# Patient Record
Sex: Male | Born: 1945 | Race: White | Hispanic: No | Marital: Married | Smoking: Former smoker
Health system: Southern US, Community
[De-identification: ages and names within clinical notes are randomized; demographics above are authoritative.]

## PROBLEM LIST (undated history)

## (undated) DIAGNOSIS — T402X5A Adverse effect of other opioids, initial encounter: Secondary | ICD-10-CM

## (undated) DIAGNOSIS — IMO0002 Reserved for concepts with insufficient information to code with codable children: Secondary | ICD-10-CM

## (undated) DIAGNOSIS — J4489 Other specified chronic obstructive pulmonary disease: Secondary | ICD-10-CM

## (undated) DIAGNOSIS — S2249XA Multiple fractures of ribs, unspecified side, initial encounter for closed fracture: Secondary | ICD-10-CM

## (undated) DIAGNOSIS — F419 Anxiety disorder, unspecified: Secondary | ICD-10-CM

## (undated) DIAGNOSIS — I10 Essential (primary) hypertension: Secondary | ICD-10-CM

## (undated) DIAGNOSIS — J151 Pneumonia due to Pseudomonas: Secondary | ICD-10-CM

## (undated) DIAGNOSIS — M545 Low back pain, unspecified: Secondary | ICD-10-CM

## (undated) DIAGNOSIS — Z9981 Dependence on supplemental oxygen: Secondary | ICD-10-CM

## (undated) DIAGNOSIS — R042 Hemoptysis: Secondary | ICD-10-CM

## (undated) DIAGNOSIS — C801 Malignant (primary) neoplasm, unspecified: Secondary | ICD-10-CM

## (undated) DIAGNOSIS — IMO0001 Reserved for inherently not codable concepts without codable children: Secondary | ICD-10-CM

## (undated) DIAGNOSIS — K08109 Complete loss of teeth, unspecified cause, unspecified class: Secondary | ICD-10-CM

## (undated) DIAGNOSIS — R569 Unspecified convulsions: Secondary | ICD-10-CM

## (undated) DIAGNOSIS — K5903 Drug induced constipation: Secondary | ICD-10-CM

## (undated) DIAGNOSIS — Z720 Tobacco use: Secondary | ICD-10-CM

## (undated) DIAGNOSIS — G8929 Other chronic pain: Secondary | ICD-10-CM

## (undated) DIAGNOSIS — K219 Gastro-esophageal reflux disease without esophagitis: Secondary | ICD-10-CM

## (undated) DIAGNOSIS — J9383 Other pneumothorax: Secondary | ICD-10-CM

## (undated) DIAGNOSIS — S43006A Unspecified dislocation of unspecified shoulder joint, initial encounter: Secondary | ICD-10-CM

## (undated) DIAGNOSIS — Z972 Presence of dental prosthetic device (complete) (partial): Secondary | ICD-10-CM

## (undated) DIAGNOSIS — J449 Chronic obstructive pulmonary disease, unspecified: Secondary | ICD-10-CM

## (undated) DIAGNOSIS — S0990XA Unspecified injury of head, initial encounter: Secondary | ICD-10-CM

## (undated) HISTORY — PX: VASECTOMY: SHX75

## (undated) HISTORY — DX: Reserved for inherently not codable concepts without codable children: IMO0001

## (undated) HISTORY — DX: Reserved for concepts with insufficient information to code with codable children: IMO0002

## (undated) HISTORY — PX: HERNIA REPAIR: SHX51

## (undated) HISTORY — PX: DIAGNOSTIC LAPAROSCOPY: SUR761

## (undated) SURGERY — VIDEO BRONCHOSCOPY WITHOUT FLUORO
Anesthesia: Moderate Sedation

---

## 1996-06-06 DIAGNOSIS — S2249XA Multiple fractures of ribs, unspecified side, initial encounter for closed fracture: Secondary | ICD-10-CM

## 1996-06-06 DIAGNOSIS — S43006A Unspecified dislocation of unspecified shoulder joint, initial encounter: Secondary | ICD-10-CM

## 1996-06-06 DIAGNOSIS — S0990XA Unspecified injury of head, initial encounter: Secondary | ICD-10-CM

## 1996-06-06 HISTORY — DX: Multiple fractures of ribs, unspecified side, initial encounter for closed fracture: S22.49XA

## 1996-06-06 HISTORY — DX: Unspecified injury of head, initial encounter: S09.90XA

## 1996-06-06 HISTORY — PX: CHEST TUBE INSERTION: SHX231

## 1996-06-06 HISTORY — DX: Unspecified dislocation of unspecified shoulder joint, initial encounter: S43.006A

## 2005-06-10 ENCOUNTER — Encounter: Admission: RE | Admit: 2005-06-10 | Discharge: 2005-06-10 | Payer: Self-pay | Admitting: Family Medicine

## 2012-11-01 ENCOUNTER — Other Ambulatory Visit: Payer: Self-pay | Admitting: Family Medicine

## 2012-11-01 ENCOUNTER — Ambulatory Visit
Admission: RE | Admit: 2012-11-01 | Discharge: 2012-11-01 | Disposition: A | Discharge status: Home / Self Care | Payer: Managed Care, Other (non HMO) | Source: Ambulatory Visit | Attending: Family Medicine | Admitting: Family Medicine

## 2012-11-01 DIAGNOSIS — J189 Pneumonia, unspecified organism: Secondary | ICD-10-CM

## 2012-11-30 ENCOUNTER — Ambulatory Visit (INDEPENDENT_AMBULATORY_CARE_PROVIDER_SITE_OTHER): Payer: Managed Care, Other (non HMO) | Admitting: Internal Medicine

## 2012-11-30 DIAGNOSIS — R0602 Shortness of breath: Secondary | ICD-10-CM

## 2012-11-30 LAB — PULMONARY FUNCTION TEST

## 2012-11-30 NOTE — Progress Notes (Signed)
PFT done today. 

## 2012-12-12 ENCOUNTER — Encounter: Payer: Self-pay | Admitting: Family Medicine

## 2014-06-03 ENCOUNTER — Ambulatory Visit
Admission: RE | Admit: 2014-06-03 | Discharge: 2014-06-03 | Disposition: A | Discharge status: Home / Self Care | Payer: Managed Care, Other (non HMO) | Source: Ambulatory Visit | Attending: Family Medicine | Admitting: Family Medicine

## 2014-06-03 ENCOUNTER — Other Ambulatory Visit: Payer: Self-pay | Admitting: Family Medicine

## 2014-06-03 DIAGNOSIS — J189 Pneumonia, unspecified organism: Secondary | ICD-10-CM

## 2014-08-03 ENCOUNTER — Encounter (HOSPITAL_COMMUNITY): Payer: Self-pay | Admitting: Emergency Medicine

## 2014-08-03 ENCOUNTER — Inpatient Hospital Stay (HOSPITAL_COMMUNITY)
Admission: EM | Admit: 2014-08-03 | Discharge: 2014-08-09 | DRG: 166 | Disposition: A | Discharge status: Home / Self Care | Payer: Medicare Other | Attending: Internal Medicine | Admitting: Internal Medicine

## 2014-08-03 ENCOUNTER — Emergency Department (HOSPITAL_COMMUNITY): Payer: Medicare Other

## 2014-08-03 ENCOUNTER — Inpatient Hospital Stay (HOSPITAL_COMMUNITY): Payer: Medicare Other

## 2014-08-03 DIAGNOSIS — C3412 Malignant neoplasm of upper lobe, left bronchus or lung: Secondary | ICD-10-CM | POA: Diagnosis not present

## 2014-08-03 DIAGNOSIS — C3490 Malignant neoplasm of unspecified part of unspecified bronchus or lung: Secondary | ICD-10-CM | POA: Diagnosis present

## 2014-08-03 DIAGNOSIS — Z720 Tobacco use: Secondary | ICD-10-CM | POA: Diagnosis not present

## 2014-08-03 DIAGNOSIS — K089 Disorder of teeth and supporting structures, unspecified: Secondary | ICD-10-CM

## 2014-08-03 DIAGNOSIS — J449 Chronic obstructive pulmonary disease, unspecified: Secondary | ICD-10-CM | POA: Diagnosis not present

## 2014-08-03 DIAGNOSIS — R911 Solitary pulmonary nodule: Secondary | ICD-10-CM | POA: Diagnosis present

## 2014-08-03 DIAGNOSIS — J42 Unspecified chronic bronchitis: Secondary | ICD-10-CM

## 2014-08-03 DIAGNOSIS — I6782 Cerebral ischemia: Secondary | ICD-10-CM | POA: Diagnosis not present

## 2014-08-03 DIAGNOSIS — K045 Chronic apical periodontitis: Secondary | ICD-10-CM | POA: Diagnosis present

## 2014-08-03 DIAGNOSIS — J9383 Other pneumothorax: Principal | ICD-10-CM | POA: Diagnosis present

## 2014-08-03 DIAGNOSIS — R918 Other nonspecific abnormal finding of lung field: Secondary | ICD-10-CM | POA: Diagnosis present

## 2014-08-03 DIAGNOSIS — R030 Elevated blood-pressure reading, without diagnosis of hypertension: Secondary | ICD-10-CM

## 2014-08-03 DIAGNOSIS — F17210 Nicotine dependence, cigarettes, uncomplicated: Secondary | ICD-10-CM | POA: Diagnosis present

## 2014-08-03 DIAGNOSIS — J939 Pneumothorax, unspecified: Secondary | ICD-10-CM | POA: Diagnosis not present

## 2014-08-03 DIAGNOSIS — C3492 Malignant neoplasm of unspecified part of left bronchus or lung: Secondary | ICD-10-CM | POA: Diagnosis not present

## 2014-08-03 DIAGNOSIS — I10 Essential (primary) hypertension: Secondary | ICD-10-CM | POA: Diagnosis not present

## 2014-08-03 DIAGNOSIS — Z9689 Presence of other specified functional implants: Secondary | ICD-10-CM

## 2014-08-03 DIAGNOSIS — R0602 Shortness of breath: Secondary | ICD-10-CM | POA: Diagnosis not present

## 2014-08-03 DIAGNOSIS — J90 Pleural effusion, not elsewhere classified: Secondary | ICD-10-CM | POA: Diagnosis not present

## 2014-08-03 DIAGNOSIS — K029 Dental caries, unspecified: Secondary | ICD-10-CM | POA: Diagnosis present

## 2014-08-03 DIAGNOSIS — T380X5A Adverse effect of glucocorticoids and synthetic analogues, initial encounter: Secondary | ICD-10-CM | POA: Diagnosis present

## 2014-08-03 DIAGNOSIS — R042 Hemoptysis: Secondary | ICD-10-CM | POA: Diagnosis present

## 2014-08-03 DIAGNOSIS — J9811 Atelectasis: Secondary | ICD-10-CM | POA: Diagnosis not present

## 2014-08-03 DIAGNOSIS — J151 Pneumonia due to Pseudomonas: Secondary | ICD-10-CM | POA: Diagnosis present

## 2014-08-03 DIAGNOSIS — R0781 Pleurodynia: Secondary | ICD-10-CM | POA: Diagnosis not present

## 2014-08-03 DIAGNOSIS — C801 Malignant (primary) neoplasm, unspecified: Secondary | ICD-10-CM

## 2014-08-03 DIAGNOSIS — F419 Anxiety disorder, unspecified: Secondary | ICD-10-CM | POA: Diagnosis present

## 2014-08-03 DIAGNOSIS — Z789 Other specified health status: Secondary | ICD-10-CM | POA: Diagnosis not present

## 2014-08-03 DIAGNOSIS — J9311 Primary spontaneous pneumothorax: Secondary | ICD-10-CM | POA: Diagnosis not present

## 2014-08-03 DIAGNOSIS — IMO0001 Reserved for inherently not codable concepts without codable children: Secondary | ICD-10-CM | POA: Diagnosis present

## 2014-08-03 DIAGNOSIS — R739 Hyperglycemia, unspecified: Secondary | ICD-10-CM | POA: Diagnosis present

## 2014-08-03 DIAGNOSIS — F17200 Nicotine dependence, unspecified, uncomplicated: Secondary | ICD-10-CM | POA: Diagnosis not present

## 2014-08-03 DIAGNOSIS — F411 Generalized anxiety disorder: Secondary | ICD-10-CM | POA: Diagnosis present

## 2014-08-03 DIAGNOSIS — K5730 Diverticulosis of large intestine without perforation or abscess without bleeding: Secondary | ICD-10-CM | POA: Diagnosis not present

## 2014-08-03 DIAGNOSIS — Z4682 Encounter for fitting and adjustment of non-vascular catheter: Secondary | ICD-10-CM | POA: Diagnosis not present

## 2014-08-03 DIAGNOSIS — K006 Disturbances in tooth eruption: Secondary | ICD-10-CM | POA: Diagnosis not present

## 2014-08-03 DIAGNOSIS — E440 Moderate protein-calorie malnutrition: Secondary | ICD-10-CM | POA: Diagnosis present

## 2014-08-03 DIAGNOSIS — N281 Cyst of kidney, acquired: Secondary | ICD-10-CM | POA: Diagnosis not present

## 2014-08-03 DIAGNOSIS — J441 Chronic obstructive pulmonary disease with (acute) exacerbation: Secondary | ICD-10-CM | POA: Diagnosis present

## 2014-08-03 DIAGNOSIS — J984 Other disorders of lung: Secondary | ICD-10-CM | POA: Diagnosis not present

## 2014-08-03 DIAGNOSIS — J189 Pneumonia, unspecified organism: Secondary | ICD-10-CM | POA: Diagnosis present

## 2014-08-03 DIAGNOSIS — Z452 Encounter for adjustment and management of vascular access device: Secondary | ICD-10-CM | POA: Diagnosis not present

## 2014-08-03 DIAGNOSIS — J439 Emphysema, unspecified: Secondary | ICD-10-CM | POA: Diagnosis not present

## 2014-08-03 HISTORY — DX: Hemoptysis: R04.2

## 2014-08-03 HISTORY — DX: Other pneumothorax: J93.83

## 2014-08-03 HISTORY — DX: Unspecified injury of head, initial encounter: S09.90XA

## 2014-08-03 HISTORY — DX: Pneumonia due to Pseudomonas: J15.1

## 2014-08-03 HISTORY — DX: Tobacco use: Z72.0

## 2014-08-03 HISTORY — DX: Chronic obstructive pulmonary disease, unspecified: J44.9

## 2014-08-03 HISTORY — DX: Other specified chronic obstructive pulmonary disease: J44.89

## 2014-08-03 HISTORY — DX: Unspecified dislocation of unspecified shoulder joint, initial encounter: S43.006A

## 2014-08-03 HISTORY — DX: Multiple fractures of ribs, unspecified side, initial encounter for closed fracture: S22.49XA

## 2014-08-03 HISTORY — DX: Malignant (primary) neoplasm, unspecified: C80.1

## 2014-08-03 HISTORY — DX: Essential (primary) hypertension: I10

## 2014-08-03 LAB — CBC WITH DIFFERENTIAL/PLATELET
Basophils Absolute: 0 10*3/uL (ref 0.0–0.1)
Basophils Relative: 0 % (ref 0–1)
EOS PCT: 4 % (ref 0–5)
Eosinophils Absolute: 0.3 10*3/uL (ref 0.0–0.7)
HEMATOCRIT: 43.7 % (ref 39.0–52.0)
HEMOGLOBIN: 14.7 g/dL (ref 13.0–17.0)
LYMPHS ABS: 1.8 10*3/uL (ref 0.7–4.0)
Lymphocytes Relative: 24 % (ref 12–46)
MCH: 29.6 pg (ref 26.0–34.0)
MCHC: 33.6 g/dL (ref 30.0–36.0)
MCV: 88.1 fL (ref 78.0–100.0)
MONO ABS: 0.7 10*3/uL (ref 0.1–1.0)
MONOS PCT: 9 % (ref 3–12)
NEUTROS ABS: 4.7 10*3/uL (ref 1.7–7.7)
Neutrophils Relative %: 63 % (ref 43–77)
PLATELETS: 195 10*3/uL (ref 150–400)
RBC: 4.96 MIL/uL (ref 4.22–5.81)
RDW: 13.1 % (ref 11.5–15.5)
WBC: 7.6 10*3/uL (ref 4.0–10.5)

## 2014-08-03 LAB — BASIC METABOLIC PANEL
ANION GAP: 4 — AB (ref 5–15)
BUN: 14 mg/dL (ref 6–23)
CHLORIDE: 104 mmol/L (ref 96–112)
CO2: 32 mmol/L (ref 19–32)
Calcium: 9 mg/dL (ref 8.4–10.5)
Creatinine, Ser: 0.94 mg/dL (ref 0.50–1.35)
GFR calc Af Amer: >90 mL/min (ref >90)
GFR, EST NON AFRICAN AMERICAN: 84 mL/min — AB (ref >90)
Glucose, Bld: 89 mg/dL (ref 70–99)
POTASSIUM: 4.7 mmol/L (ref 3.5–5.1)
Sodium: 140 mmol/L (ref 135–145)

## 2014-08-03 LAB — I-STAT TROPONIN, ED: Troponin i, poc: 0 ng/mL (ref 0.00–0.08)

## 2014-08-03 LAB — MRSA PCR SCREENING: MRSA BY PCR: NEGATIVE

## 2014-08-03 LAB — BRAIN NATRIURETIC PEPTIDE: B Natriuretic Peptide: 30.5 pg/mL (ref 0.0–100.0)

## 2014-08-03 MED ORDER — MORPHINE SULFATE 2 MG/ML IJ SOLN
2.0000 mg | Freq: Once | INTRAMUSCULAR | Status: AC
  Administered 2014-08-03: 2 mg via INTRAVENOUS
  Filled 2014-08-03: qty 1

## 2014-08-03 MED ORDER — NICOTINE 14 MG/24HR TD PT24
14.0000 mg | MEDICATED_PATCH | Freq: Every day | TRANSDERMAL | Status: DC
  Administered 2014-08-03 – 2014-08-09 (×7): 14 mg via TRANSDERMAL
  Filled 2014-08-03 (×8): qty 1

## 2014-08-03 MED ORDER — LIDOCAINE HCL (PF) 1 % IJ SOLN
INTRAMUSCULAR | Status: AC
  Administered 2014-08-03: 5 mL
  Filled 2014-08-03: qty 5

## 2014-08-03 MED ORDER — SODIUM CHLORIDE 0.9 % IV SOLN
INTRAVENOUS | Status: DC
  Administered 2014-08-03: 17:00:00 via INTRAVENOUS

## 2014-08-03 MED ORDER — GUAIFENESIN-DM 100-10 MG/5ML PO SYRP
10.0000 mL | ORAL_SOLUTION | ORAL | Status: DC | PRN
Start: 2014-08-03 — End: 2014-08-07
  Filled 2014-08-03: qty 10

## 2014-08-03 MED ORDER — GUAIFENESIN ER 600 MG PO TB12
1200.0000 mg | ORAL_TABLET | Freq: Two times a day (BID) | ORAL | Status: DC
  Administered 2014-08-03 – 2014-08-09 (×12): 1200 mg via ORAL
  Filled 2014-08-03 (×14): qty 2

## 2014-08-03 MED ORDER — ENOXAPARIN SODIUM 40 MG/0.4ML ~~LOC~~ SOLN
40.0000 mg | SUBCUTANEOUS | Status: DC
  Administered 2014-08-03 – 2014-08-08 (×6): 40 mg via SUBCUTANEOUS
  Filled 2014-08-03 (×8): qty 0.4

## 2014-08-03 MED ORDER — IPRATROPIUM-ALBUTEROL 0.5-2.5 (3) MG/3ML IN SOLN
3.0000 mL | Freq: Once | RESPIRATORY_TRACT | Status: DC
  Filled 2014-08-03: qty 3

## 2014-08-03 MED ORDER — IBUPROFEN 200 MG PO TABS
600.0000 mg | ORAL_TABLET | Freq: Four times a day (QID) | ORAL | Status: DC | PRN
  Administered 2014-08-04: 600 mg via ORAL
  Filled 2014-08-03: qty 3

## 2014-08-03 MED ORDER — HYDROCODONE-ACETAMINOPHEN 5-325 MG PO TABS
1.0000 | ORAL_TABLET | ORAL | Status: DC | PRN
  Administered 2014-08-03: 1 via ORAL
  Administered 2014-08-04 – 2014-08-08 (×9): 2 via ORAL
  Filled 2014-08-03 (×2): qty 2
  Filled 2014-08-03: qty 1
  Filled 2014-08-03 (×7): qty 2

## 2014-08-03 MED ORDER — ONDANSETRON HCL 4 MG/2ML IJ SOLN: 4.0000 mg | Freq: Four times a day (QID) | INTRAMUSCULAR | Status: DC | PRN

## 2014-08-03 MED ORDER — BIO-FLAX 1000 MG PO CAPS: 1000.0000 mg | ORAL_CAPSULE | Freq: Two times a day (BID) | ORAL | Status: DC

## 2014-08-03 MED ORDER — MORPHINE SULFATE 4 MG/ML IJ SOLN
4.0000 mg | INTRAMUSCULAR | Status: DC | PRN
  Administered 2014-08-03 – 2014-08-08 (×3): 4 mg via INTRAVENOUS
  Filled 2014-08-03 (×3): qty 1

## 2014-08-03 MED ORDER — ALUM & MAG HYDROXIDE-SIMETH 200-200-20 MG/5ML PO SUSP: 30.0000 mL | Freq: Four times a day (QID) | ORAL | Status: DC | PRN

## 2014-08-03 MED ORDER — MIDAZOLAM HCL 2 MG/2ML IJ SOLN
4.0000 mg | Freq: Once | INTRAMUSCULAR | Status: AC
  Administered 2014-08-03: 2 mg via INTRAVENOUS
  Filled 2014-08-03: qty 4

## 2014-08-03 MED ORDER — CLONAZEPAM 1 MG PO TABS
1.0000 mg | ORAL_TABLET | Freq: Two times a day (BID) | ORAL | Status: DC
  Administered 2014-08-03 – 2014-08-09 (×12): 1 mg via ORAL
  Filled 2014-08-03 (×9): qty 2
  Filled 2014-08-03: qty 1
  Filled 2014-08-03: qty 2
  Filled 2014-08-03: qty 1
  Filled 2014-08-03: qty 2

## 2014-08-03 MED ORDER — DOXYCYCLINE HYCLATE 100 MG PO TABS
100.0000 mg | ORAL_TABLET | Freq: Two times a day (BID) | ORAL | Status: DC
  Administered 2014-08-03 – 2014-08-06 (×6): 100 mg via ORAL
  Filled 2014-08-03 (×8): qty 1

## 2014-08-03 MED ORDER — MORPHINE SULFATE 4 MG/ML IJ SOLN
4.0000 mg | Freq: Once | INTRAMUSCULAR | Status: AC
  Administered 2014-08-03: 4 mg via INTRAVENOUS
  Filled 2014-08-03: qty 1

## 2014-08-03 MED ORDER — ONDANSETRON HCL 4 MG PO TABS: 4.0000 mg | ORAL_TABLET | Freq: Four times a day (QID) | ORAL | Status: DC | PRN

## 2014-08-03 MED ORDER — IPRATROPIUM-ALBUTEROL 0.5-2.5 (3) MG/3ML IN SOLN
3.0000 mL | Freq: Four times a day (QID) | RESPIRATORY_TRACT | Status: DC
  Administered 2014-08-03 – 2014-08-09 (×22): 3 mL via RESPIRATORY_TRACT
  Filled 2014-08-03 (×24): qty 3

## 2014-08-03 MED ORDER — SODIUM CHLORIDE 0.9 % IJ SOLN
3.0000 mL | Freq: Two times a day (BID) | INTRAMUSCULAR | Status: DC
  Administered 2014-08-04 – 2014-08-09 (×11): 3 mL via INTRAVENOUS

## 2014-08-03 MED ORDER — METHYLPREDNISOLONE SODIUM SUCC 125 MG IJ SOLR
60.0000 mg | Freq: Four times a day (QID) | INTRAMUSCULAR | Status: DC
  Administered 2014-08-03 – 2014-08-04 (×4): 60 mg via INTRAVENOUS
  Filled 2014-08-03 (×3): qty 0.96
  Filled 2014-08-03 (×3): qty 2
  Filled 2014-08-03: qty 0.96

## 2014-08-03 MED ORDER — HYDRALAZINE HCL 20 MG/ML IJ SOLN
10.0000 mg | Freq: Four times a day (QID) | INTRAMUSCULAR | Status: DC | PRN
  Administered 2014-08-03: 10 mg via INTRAVENOUS
  Filled 2014-08-03: qty 1

## 2014-08-03 MED ORDER — ALBUTEROL SULFATE (2.5 MG/3ML) 0.083% IN NEBU
2.5000 mg | INHALATION_SOLUTION | RESPIRATORY_TRACT | Status: DC | PRN
  Administered 2014-08-07 – 2014-08-08 (×2): 2.5 mg via RESPIRATORY_TRACT
  Filled 2014-08-03 (×2): qty 3

## 2014-08-03 MED ORDER — FAMOTIDINE 20 MG PO TABS
20.0000 mg | ORAL_TABLET | Freq: Two times a day (BID) | ORAL | Status: DC
  Administered 2014-08-03 – 2014-08-09 (×12): 20 mg via ORAL
  Filled 2014-08-03 (×14): qty 1

## 2014-08-03 MED ORDER — LORATADINE 10 MG PO TABS
10.0000 mg | ORAL_TABLET | Freq: Two times a day (BID) | ORAL | Status: DC
  Administered 2014-08-03 – 2014-08-09 (×12): 10 mg via ORAL
  Filled 2014-08-03 (×14): qty 1

## 2014-08-03 NOTE — ED Notes (Signed)
Dr. Owen at bedside

## 2014-08-03 NOTE — Progress Notes (Signed)
PHARMACIST - PHYSICIAN ORDER COMMUNICATION  CONCERNING: P&T Medication Policy on Herbal Medications  DESCRIPTION:  This patient's order for: BioFlax caps  has been noted.  This product(s) is classified as an "herbal" or natural product. Due to a lack of definitive safety studies or FDA approval, nonstandard manufacturing practices, plus the potential risk of unknown drug-drug interactions while on inpatient medications, the Pharmacy and Therapeutics Committee does not permit the use of "herbal" or natural products of this type within [facility].   ACTION TAKEN: The pharmacy department is unable to verify this order at this time and your patient has been informed of this safety policy. Please reevaluate patient's clinical condition at discharge and address if the herbal or natural product(s) should be resumed at that time.  Sherlon Handing, PharmD, BCPS Clinical pharmacist, pager [phone] 08/03/2014 6:52 PM

## 2014-08-03 NOTE — ED Notes (Signed)
Spoke with patient wife at bedside. States patient takes duo neb 4 times a day and only one dose. Doctor notified.

## 2014-08-03 NOTE — Op Note (Signed)
CARDIOTHORACIC SURGERY OPERATIVE NOTE  Date of Procedure:  08/03/2014  Preoperative Diagnosis: Left Pneumothorax  Postoperative Diagnosis: Same  Procedure:   left chest tube placement  Surgeon:   Valentina Gu. Roxy Manns, MD  Anesthesia: 1% lidocaine local with intravenous sedation    DETAILS OF THE OPERATIVE PROCEDURE  Following full informed consent the patient was given midazolam 2 mg and morphine 2 mg intravenously and continuously monitored for rhythm, BP and oxygen saturation. The left chest was prepared and draped in a sterile manner. 1% lidocaine was utilized to anesthetize the skin and subcutaneous tissues. A small incision was made and a 28 French straight chest tube was placed through the incision into the pleural space. The tube was secured to the skin and connected to a closed suction collection device. The patient tolerated the procedure well. A portable CXR was ordered. There were no complications.    Valentina Gu. Roxy Manns, MD 08/03/2014 4:12 PM
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
## 2014-08-03 NOTE — ED Notes (Signed)
Called for STAT portable xray.
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
## 2014-08-03 NOTE — ED Notes (Signed)
EKG completed and given to EDP.  

## 2014-08-03 NOTE — Progress Notes (Signed)
Pt arrived from Ed per stretcher alert/oriented CHG bath done and MRSA swab done oriented to room given nurse call light released new orders
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
## 2014-08-03 NOTE — ED Notes (Signed)
EPD at bedside 

## 2014-08-03 NOTE — ED Notes (Signed)
Onset 3-4 days ago cough and shortness of breath with history of COPD.  Patient stated coughed real hard one day ago and shortness of breath worsening overtime. Seen at [facility] office today sent to ED for evaluation via EMS.

## 2014-08-04 ENCOUNTER — Inpatient Hospital Stay (HOSPITAL_COMMUNITY): Payer: Medicare Other

## 2014-08-04 LAB — CBC
HEMATOCRIT: 44.9 % (ref 39.0–52.0)
HEMOGLOBIN: 15.3 g/dL (ref 13.0–17.0)
MCH: 29.7 pg (ref 26.0–34.0)
MCHC: 34.1 g/dL (ref 30.0–36.0)
MCV: 87.2 fL (ref 78.0–100.0)
Platelets: 220 10*3/uL (ref 150–400)
RBC: 5.15 MIL/uL (ref 4.22–5.81)
RDW: 13 % (ref 11.5–15.5)
WBC: 7 10*3/uL (ref 4.0–10.5)

## 2014-08-04 LAB — BASIC METABOLIC PANEL
Anion gap: 7 (ref 5–15)
BUN: 17 mg/dL (ref 6–23)
CHLORIDE: 101 mmol/L (ref 96–112)
CO2: 29 mmol/L (ref 19–32)
Calcium: 8.9 mg/dL (ref 8.4–10.5)
Creatinine, Ser: 0.79 mg/dL (ref 0.50–1.35)
GFR calc Af Amer: >90 mL/min (ref >90)
GFR calc non Af Amer: >90 mL/min (ref >90)
Glucose, Bld: 192 mg/dL — ABNORMAL HIGH (ref 70–99)
POTASSIUM: 4 mmol/L (ref 3.5–5.1)
Sodium: 137 mmol/L (ref 135–145)

## 2014-08-04 MED ORDER — AMLODIPINE BESYLATE 5 MG PO TABS
5.0000 mg | ORAL_TABLET | Freq: Every day | ORAL | Status: DC
  Administered 2014-08-04 – 2014-08-06 (×3): 5 mg via ORAL
  Filled 2014-08-04 (×3): qty 1

## 2014-08-04 MED ORDER — METHYLPREDNISOLONE SODIUM SUCC 125 MG IJ SOLR
60.0000 mg | Freq: Two times a day (BID) | INTRAMUSCULAR | Status: DC
  Administered 2014-08-04 – 2014-08-06 (×4): 60 mg via INTRAVENOUS
  Filled 2014-08-04 (×4): qty 0.96

## 2014-08-04 MED ORDER — HYDRALAZINE HCL 20 MG/ML IJ SOLN
20.0000 mg | Freq: Four times a day (QID) | INTRAMUSCULAR | Status: DC | PRN
  Administered 2014-08-04: 20 mg via INTRAVENOUS
  Filled 2014-08-04: qty 1

## 2014-08-04 NOTE — Progress Notes (Addendum)
[facility] TEAM 1 - Stepdown/ICU TEAM Progress Note  Jeffrey Hines OFB:[id number] DOB: 1946-01-19 DOA: 08/03/2014 PCP: [name]  Admit HPI / Brief Narrative: 69 y.o. male with history of COPD not on home oxygen, ongoing tobacco abuse, anxiety, and HTN who presented to the [facility] ED on 08/03/14 with left-sided chest pain and dyspnea.  The patient had a violent coughing spell followed by the abrupt onset of severe left sided chest pain and dyspnea. He initially went to his PCP's walk-in clinic where O2 saturations were 90% on room air and chest x-ray showed large left pneumothorax. He was transported by ambulance to the ED where Cardiothoracic Surgery placed a left chest tube and Hospitalist admission was requested.   HPI/Subjective: Pt states he feels much better.  He notes only modest pain at site of L CT.  He denies n/v, abdom pain, or HA.    Assessment/Plan:  L Spontaneous Pneumothorax Care per TCTS  LUL CAP  - hemoptysis  Cont emperic abx - will need repeat imaging to assure resolves w/ abx tx  12 x 10 mm spiculated pleural-based density superior segment of right lower lobe  Noted via CT of chest - Radiology suggests f/u imaging in 3 months - pt tells me this has been present "for years" and that he [facility] and was told it was "a scar" - I will attempt to obtain records from the [place]   Acute COPD exacerbation long-standing history of heavy tobacco abuse - has been having problems with chronic relapsing bronchitis off and on for the past 6 months, w/ occasional blood tinged sputum - would benefit from outpt Pulm f/u - TCTS note indicates they have consulted PCCM   Tobacco abuse  Counseled again on the absolute need to never smoke again   Anxiety   Elevated BP Pt has no prior hx of HTN per his report - current elevated BP may simply be due to his acute illness - will tx for now, but watch for possible need to stop meds as he  improves  Steroid induced hyperglycemia Check A1c to assure he does not have underlying DM   Code Status: FULL Family Communication: no family present at time of exam Disposition Plan: SDU  Consultants: TCTS  Procedures: 2/28 - L chest tube placement   Antibiotics: Doxycycline 2/28 >  DVT prophylaxis: lovenox  Objective: Blood pressure 161/106, pulse 103, temperature 97.7 F (36.5 C), temperature source Oral, resp. rate 21, height 5\' 11"  (1.803 m), weight 81 kg (178 lb 9.2 oz), SpO2 94 %.  Intake/Output Summary (Last 24 hours) at 08/04/14 1401 Last data filed at 08/04/14 0930  Gross per 24 hour  Intake  877.5 ml  Output    700 ml  Net  177.5 ml   Exam: General: No acute respiratory distress laying in bed  Lungs: Clear to auscultation bilaterally without wheezes or crackles Cardiovascular: Regular rate and rhythm without murmur gallop or rub normal S1 and S2 Abdomen: Nontender, nondistended, soft, bowel sounds positive, no rebound, no ascites, no appreciable mass Extremities: No significant cyanosis, clubbing, or edema bilateral lower extremities  Data Reviewed: Basic Metabolic Panel:  Recent Labs Lab 08/03/14 1359 08/04/14 0545  NA 140 137  K 4.7 4.0  CL 104 101  CO2 32 29  GLUCOSE 89 192*  BUN 14 17  CREATININE 0.94 0.79  CALCIUM 9.0 8.9    Liver Function Tests: No results for input(s): AST, ALT, ALKPHOS,  BILITOT, PROT, ALBUMIN in the last 168 hours. No results for input(s): LIPASE, AMYLASE in the last 168 hours. No results for input(s): AMMONIA in the last 168 hours.  CBC:  Recent Labs Lab 08/03/14 1359 08/04/14 0545  WBC 7.6 7.0  NEUTROABS 4.7  --   HGB 14.7 15.3  HCT 43.7 44.9  MCV 88.1 87.2  PLT 195 220   CBG: No results for input(s): GLUCAP in the last 168 hours.  Recent Results (from the past 240 hour(s))  MRSA PCR Screening     Status: None   Collection Time: 08/03/14 12:32 PM  Result Value Ref Range Status   MRSA by PCR  NEGATIVE NEGATIVE Final    Comment:        The GeneXpert MRSA Assay (FDA approved for NASAL specimens only), is one component of a comprehensive MRSA colonization surveillance program. It is not intended to diagnose MRSA infection nor to guide or monitor treatment for MRSA infections.      Studies:  Recent x-ray studies have been reviewed in detail by the Attending Physician  Scheduled Meds:  Scheduled Meds: . clonazePAM  1 mg Oral BID  . doxycycline  100 mg Oral BID PC  . enoxaparin (LOVENOX) injection  40 mg Subcutaneous Q24H  . famotidine  20 mg Oral BID  . guaiFENesin  1,200 mg Oral BID  . ipratropium-albuterol  3 mL Nebulization Once  . ipratropium-albuterol  3 mL Nebulization QID  . ipratropium-albuterol  3 mL Nebulization Once  . loratadine  10 mg Oral Q12H  . methylPREDNISolone (SOLU-MEDROL) injection  60 mg Intravenous Q6H  . nicotine  14 mg Transdermal Daily  . sodium chloride  3 mL Intravenous Q12H    Time spent on care of this patient: 35 mins   MCCLUNG,JEFFREY T , MD   [facility] Office  [phone] Pager - Text Page per Shea Evans as per below:  On-Call/Text Page:      [email or web address]      password [email or web address]  If 7PM-7AM, please contact night-coverage [email or web address] Password [email or web address] 08/04/2014, 2:01 PM   LOS: 1 day

## 2014-08-04 NOTE — Consult Note (Signed)
Name: Jeffrey Hines MRN: [id number] DOB: Mar 19, 1946    ADMISSION DATE:  08/03/2014 CONSULTATION DATE:  08/04/2014  REFERRING MD :  Thereasa Solo  CHIEF COMPLAINT:  COPD exacerbation complicated by large left PTX s/p chest tube 2/28  BRIEF PATIENT DESCRIPTION: 69 y.o. M brought to [facility] ED 2/28 for chest pain and worsening dyspnea.  Found to have AECOPD complicated by large left sided PTX.  CVTS consulted and chest tube placed in ED.  PCCM consulted for recs regarding COPD and ? Bronch for further evaluation of hemoptysis.  SIGNIFICANT EVENTS  2/28 - admit, chest tube placed by Dr. Ricard Dillon with CVTS 2/29 - PCCM consult  STUDIES:  CXR 2/28 >>> left PTX CT chest 2/28 >>> large left PTX, 90%.  34mm x 99mm spiculated pleural based density in superior segment of RLL.    HISTORY OF PRESENT ILLNESS:  Jeffrey Hines is a 69 y.o. M with PMH as outlined below.  He presented to [facility] ED 2/28 with chest pain and worsening dyspnea.  At 11pm on night prior to presentation, he had a coughing spell followed by abrupt onset severe left sided chest pain and dyspnea.  His wife suggested that he seek eval in ED but he declied.  Symptoms persisted overnight and into the morning and pt was unable to get comfortable.  He went to PCP walk in clinic following morning and CXR showed large left sided PTX.  He was transported to [facility] ED where CVTS was consulted and placed a left sided chest tube. For 1 week prior to admit, pt reports that he had been having progressively worsening cough productive of green colored sputum, dyspnea, and wheezing.  CT of chest revealed a spiculated pleural based density in superior segment of RLL.  He is a long time smoker, since age 32.  He used to smoke at least 1ppd for unknown # of years, now down to about 1/2 ppd.  In addition, pt had 1 - 2 episodes of ? Hemoptysis.  Due to his underlying COPD, PTX, ? Hemoptysis and CT findings, pulm consult was requested.  PFT's from 12/12/12:  FEV1 68% pre / 66%  post, ratio 74% pre / 70% post.  PAST MEDICAL HISTORY :   has a past medical history of COPD (chronic obstructive pulmonary disease); Closed head injury (1998); Multiple rib fractures (1998); Shoulder dislocation (1998); COPD with chronic bronchitis; Tobacco abuse; Hypertension; Spontaneous pneumothorax (08/03/2014); and Hemoptysis (08/03/2014).  has past surgical history that includes Hernia repair and Chest tube insertion (Left, 1998). Prior to Admission medications   Medication Sig Start Date End Date Taking? Authorizing Provider  clonazePAM (KLONOPIN) 1 MG tablet Take 1 mg by mouth 2 (two) times daily. 07/30/14  Yes Historical Provider, MD  dextromethorphan-guaiFENesin (MUCINEX DM) 30-600 MG per 12 hr tablet Take 1 tablet by mouth every 12 (twelve) hours.   Yes Historical Provider, MD  Flaxseed, Linseed, (BIO-FLAX) 1000 MG CAPS Take 1,000 mg by mouth 2 (two) times daily.   Yes Historical Provider, MD  ibuprofen (ADVIL,MOTRIN) 200 MG tablet Take 400 mg by mouth every 6 (six) hours as needed for mild pain or moderate pain.   Yes Historical Provider, MD  ipratropium-albuterol (DUONEB) 0.5-2.5 (3) MG/3ML SOLN Take 3 mLs by nebulization 4 (four) times daily as needed (Wheezing, shortness of breath).  07/08/14  Yes Historical Provider, MD  loratadine (CLARITIN) 10 MG tablet Take 10 mg by mouth every 12 (twelve) hours.   Yes Historical Provider, MD  PROVENTIL HFA 108 (90 BASE) MCG/ACT  inhaler Inhale 2 puffs into the lungs every 6 (six) hours as needed. 07/05/14  Yes Historical Provider, MD  Saw Palmetto, Serenoa repens, (SAW PALMETTO PO) Take 2 tablets by mouth 3 (three) times daily.   Yes Historical Provider, MD  ST JOHNS WORT PO Take 1 tablet by mouth 3 (three) times daily.   Yes Historical Provider, MD  SYMBICORT 160-4.5 MCG/ACT inhaler Inhale 2 puffs into the lungs 2 (two) times daily. 07/05/14  Yes Historical Provider, MD   Allergies  Allergen Reactions  . Advair Diskus [Fluticasone-Salmeterol]  Anxiety    FAMILY HISTORY:  family history is not on file. SOCIAL HISTORY:  reports that he has been smoking Cigarettes.  He has been smoking about 0.50 packs per day. He does not have any smokeless tobacco history on file. He reports that he does not drink alcohol or use illicit drugs.  REVIEW OF SYSTEMS:   All negative; except for those that are bolded, which indicate positives.  Constitutional: weight loss, weight gain, night sweats, fevers, chills, fatigue, weakness.  HEENT: headaches, sore throat, sneezing, nasal congestion, post nasal drip, difficulty swallowing, tooth/dental problems, visual complaints, visual changes, ear aches. Neuro: difficulty with speech, weakness, numbness, ataxia. CV:  chest pain, orthopnea, PND, swelling in lower extremities, dizziness, palpitations, syncope.  Resp: cough, hemoptysis, dyspnea, wheezing. GI  heartburn, indigestion, abdominal pain, nausea, vomiting, diarrhea, constipation, change in bowel habits, loss of appetite, hematemesis, melena, hematochezia.  GU: dysuria, change in color of urine, urgency or frequency, flank pain, hematuria. MSK: joint pain or swelling, decreased range of motion. Psych: change in mood or affect, depression, anxiety, suicidal ideations, homicidal ideations. Skin: rash, itching, bruising.    SUBJECTIVE:  Denies hemoptysis, states it only happened maybe 2 times last week.  Now coughing up brown colored sputum.  Denies chest pain.  Breathing is much more comfortable.  VITAL SIGNS: Temp:  [97.6 F (36.4 C)-98.9 F (37.2 C)] 97.7 F (36.5 C) (02/29 1203) Pulse Rate:  [88-120] 103 (02/29 1203) Resp:  [17-32] 21 (02/29 1203) BP: (130-179)/(77-138) 161/106 mmHg (02/29 1203) SpO2:  [89 %-97 %] 94 % (02/29 1207) FiO2 (%):  [4 %-24 %] 4 % (02/29 1207) Weight:  [81 kg (178 lb 9.2 oz)] 81 kg (178 lb 9.2 oz) (02/28 1855)  PHYSICAL EXAMINATION: General: WDWN male, resting in bed, in NAD. Neuro: A&O x 3, non-focal.  HEENT:  [place]/AT. PERRL, sclerae anicteric. Cardiovascular: RRR, no M/R/G.  Lungs: Respirations even and unlabored.  Expiratory wheeze.  Left chest tube in place on suction, no air leak. Abdomen: BS x 4, soft, NT/ND.  Musculoskeletal: No gross deformities, no edema.  Skin: Intact, warm, no rashes.     Recent Labs Lab 08/03/14 1359 08/04/14 0545  NA 140 137  K 4.7 4.0  CL 104 101  CO2 32 29  BUN 14 17  CREATININE 0.94 0.79  GLUCOSE 89 192*    Recent Labs Lab 08/03/14 1359 08/04/14 0545  HGB 14.7 15.3  HCT 43.7 44.9  WBC 7.6 7.0  PLT 195 220   Ct Chest Wo Contrast  08/03/2014   CLINICAL DATA:  Pneumothorax.  EXAM: CT CHEST WITHOUT CONTRAST  TECHNIQUE: Multidetector CT imaging of the chest was performed following the standard protocol without IV contrast.  COMPARISON:  Radiographs of same day.  FINDINGS: There is noted a large left-sided pneumothorax of greater than 90%. Significant atelectasis of the left upper and lower lobes is noted. No significant pleural effusion is noted. 12 x 10 mm spiculated  density is noted along the posterior pleura of the superior segment of the right lower lobe. It is uncertain if this represents scarring or neoplasm. Visualized portion of upper abdomen appears normal. Multiple old left posterior rib fractures are noted. No acute fracture is noted. No mediastinal shift is seen at this time.  IMPRESSION: Large left-sided pneumothorax is noted of greater than 90%. No significant mediastinal shift is noted at this time.  12 x 10 mm spiculated pleural-based density is noted medially in superior segment of right lower lobe which may represent scarring or neoplasm. PET-CT or followup CT scan in 3 months is recommended for further evaluation.  Critical Value/emergent results were called by telephone at the time of interpretation on 08/03/2014 at 2:29 pm to Dr. Charlesetta Shanks , who verbally acknowledged these results.   Electronically Signed   By: Marijo Conception, M.D.   On:  08/03/2014 14:29   Dg Chest Port 1 View  08/04/2014   CLINICAL DATA:  Reassess pneumothorax  EXAM: PORTABLE CHEST - 1 VIEW  COMPARISON:  Portable chest x-ray of August 03, 2014  FINDINGS: There is persistent volume loss on the left with shift of the mediastinum from right to left. No pneumothorax is visible. Parenchymal consolidation in the left upper lobe is less conspicuous today. The left-sided chest tube tip remains in the pulmonary apex. The right lung is hyperinflated and clear. The heart is normal in size. The pulmonary vascularity is not engorged.  IMPRESSION: No residual left-sided pneumothorax is demonstrated. There is no significant pleural effusion. Patchy upper lobe infiltrate has improved as well. There is compensatory hyperinflation of the right lung.   Electronically Signed   By: David  Martinique   On: 08/04/2014 08:38   Dg Chest Portable 1 View  08/03/2014   CLINICAL DATA:  Chest tube placement on left.  Pneumothorax.  EXAM: PORTABLE CHEST - 1 VIEW  COMPARISON:  Chest CT 08/03/2014  FINDINGS: Interval placement of left chest tube. Left upper airspace opacity noted, presumably consolidation. Right lung is clear. No visible effusions. No visible residual pneumothorax.  IMPRESSION: Re-expansion of the left lung following chest tube placement. Left upper lobe airspace consolidation.   Electronically Signed   By: Rolm Baptise M.D.   On: 08/03/2014 17:00    ASSESSMENT / PLAN:  Left sided PTX - s/p tube thoracostomy 2/28 Roxy Manns). Recs: Chest tube management per CVTS.  ? CAP AECOPD Tobacco use disorder Recs: Continue solumedrol, abx (doxy), BD's, robitussin, nicotine patch. Tobacco cessation.  Set up with pulmonary as an outpatient for repeat PFT's and further management. Tobacco cessation.  89mm x 50mm spiculated pleural based density - ? Etiology.  Has never had any malignancy workup.  Per hospitalist note, pt reports that this finding has been present for "years" and has been  biopsied in the past at the [place]. Hemoptysis - resolved per pt, only occurred 2 times. LUL collapse / volume loss on initial CXR - improved on CXR 2/29, ? Whether simply due to large PTX vs underlying endobronchial lesion Recs: Repeat CXR in AM.  If no continued improvement / resolution then will CT chest. Repeat CT in 3 months and f/u as outpatient. Pending repeat CT, may need PET scan.   Montey Hora, [facility] Pager: [phone]  or [phone] 08/04/2014, 2:06 PM    PCCM ATTENDING: I have reviewed pt's initial presentation, consultants notes and hospital database in detail.  The  above assessment and plan was formulated under my direction.   Merton Border, MD;  PCCM service; [place] [phone]

## 2014-08-04 NOTE — Progress Notes (Addendum)
TCTS BRIEF PROGRESS NOTE   Yesterday I placed an order for portable CXR to be done this morning to f/u pneumothorax and incomplete reexpansion of left upper lobe.  That order got changed to PA + Lateral for unclear reasons - presumably by the Hospitalist team.   I think that order change was inappropriate given that the patient's chest tube should remain on suction for the time being.  As of right now, no follow up CXR of any kind has been done.  Will continue to follow and assist with this patient's care for now, but would specifically request that the Hospitalist team consult with [name] and the Pulmonary Medicine team prior to changing our orders.  I have asked the Pulmonary Medicine team to see this patient in consultation.  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  He would be at significant risk for having underlying lung cancer, and he may need bronchoscopy.  At the very least he needs a pulmonologist to be involved in his long term care.  Jeffrey Hines H 08/04/2014 8:35 AM

## 2014-08-04 NOTE — Progress Notes (Signed)
[address]       [place] [address]             [phone]     CARDIOTHORACIC SURGERY PROGRESS NOTE  Subjective: Feels better.  Breathing much improved.  Cough improved.  Minimal pain.  Objective: Vital signs in last 24 hours: Temp:  [97.6 F (36.4 C)-98.9 F (37.2 C)] 97.7 F (36.5 C) (02/29 1203) Pulse Rate:  [88-120] 103 (02/29 1203) Cardiac Rhythm:  [-] Sinus tachycardia (02/29 0900) Resp:  [17-32] 21 (02/29 1203) BP: (130-179)/(77-138) 161/106 mmHg (02/29 1203) SpO2:  [89 %-97 %] 94 % (02/29 1207) FiO2 (%):  [4 %-24 %] 4 % (02/29 1207) Weight:  [81 kg (178 lb 9.2 oz)] 81 kg (178 lb 9.2 oz) (02/28 1855)  Physical Exam:  Rhythm:   sinus  Breath sounds: Scattered rhonchi w/out wheezes  Heart sounds:  RRR  Incisions:  n/a  Abdomen:  soft  Extremities:  Warm  Chest tubes:  Low volume thin serosanguinous output, no air leak    Intake/Output from previous day: 02/28 0701 - 02/29 0700 In: 637.5 [I.V.:637.5] Out: 500 [Urine:400; Chest Tube:100] Intake/Output this shift: Total I/O In: 240 [P.O.:240] Out: 200 [Urine:200]  Lab Results:  Recent Labs  08/03/14 1359 08/04/14 0545  WBC 7.6 7.0  HGB 14.7 15.3  HCT 43.7 44.9  PLT 195 220   BMET:  Recent Labs  08/03/14 1359 08/04/14 0545  NA 140 137  K 4.7 4.0  CL 104 101  CO2 32 29  GLUCOSE 89 192*  BUN 14 17  CREATININE 0.94 0.79  CALCIUM 9.0 8.9    CBG (last 3)  No results for input(s): GLUCAP in the last 72 hours. PT/INR:  No results for input(s): LABPROT, INR in the last 72 hours.  CXR:  PORTABLE CHEST - 1 VIEW  COMPARISON: Portable chest x-ray of August 03, 2014  FINDINGS: There is persistent volume loss on the left with shift of the mediastinum from right to left. No pneumothorax is visible. Parenchymal consolidation in the left upper lobe is less conspicuous today. The left-sided chest tube tip remains in the pulmonary apex. The right lung is hyperinflated and  clear. The heart is normal in size. The pulmonary vascularity is not engorged.  IMPRESSION: No residual left-sided pneumothorax is demonstrated. There is no significant pleural effusion. Patchy upper lobe infiltrate has improved as well. There is compensatory hyperinflation of the right lung.   Electronically Signed  By: David Martinique  On: 08/04/2014 08:38   Assessment/Plan:  Stable s/p tube thoracostomy for left spontaneous PTX.  Chest tube in good position and no residual PTX.  No air leak noted at this time.  Opacity of LUL improved but not completely resolved - possible underlying pneumonia.    Continue tube to suction for now.  Await input from Pulmonary Medicine team.  At some point I think it would be wise to get repeat CT chest w/ IV contrast to r/o possible lung mass.  Given the history of hemoptysis one could make an argument that he should have flexible bronchoscopy even if no mass is seen on CT scan.  However, this might not need to occur in the immediate future, and it might make sense to let him get over his acute problem first.   I spent in excess of 15 minutes during the conduct of this hospital encounter and >50% of this time involved direct face-to-face encounter with the patient for counseling and/or  coordination of their care.   Lewellyn Fultz H 08/04/2014 1:52 PM

## 2014-08-04 NOTE — Progress Notes (Signed)
Inpatient Diabetes Program Recommendations  AACE/ADA: New Consensus Statement on Inpatient Glycemic Control (2013)  Target Ranges:  Prepandial:   less than 140 mg/dL      Peak postprandial:   less than 180 mg/dL (1-2 hours)      Critically ill patients:  140 - 180 mg/dL   Results for NEVAEH, CASILLAS (MRN [id number]) as of 08/04/2014 08:51  Ref. Range 08/03/2014 13:59 08/04/2014 05:45  Glucose Latest Range: 70-99 mg/dL 89 192 (H)   Diabetes history: No Outpatient Diabetes medications: NA Current orders for Inpatient glycemic control: None  Inpatient Diabetes Program Recommendations Correction (SSI): While ordered steroids, may want to consider ordering CBGs with Novolog correction scale. HgbA1C: May want to consider ordering an A1C to evaluate glycemic control over the past 2-3 months.  Note: Patient does not have a documented history of diabetes. Noted fasting lab glucose of 192 mg/dl this morning. Noted hyperglycemic likely due to steroids. While inpatient and ordered steroids, may want to consider ordering CBGs with Novolog correction scale and an A1C.  Thanks, Barnie Alderman, RN, MSN, CCRN, CDE Diabetes Coordinator Inpatient Diabetes Program [phone] (Team Pager) [phone] (AP office) [phone] [facility] office)

## 2014-08-04 NOTE — Progress Notes (Signed)
Notified Kathline Magic, triad NP that pt has orders for a chest PA and lateral xray. MD advised hold until morning.

## 2014-08-05 ENCOUNTER — Inpatient Hospital Stay (HOSPITAL_COMMUNITY): Payer: Medicare Other

## 2014-08-05 ENCOUNTER — Telehealth: Payer: Self-pay | Admitting: Pulmonary Disease

## 2014-08-05 DIAGNOSIS — J9811 Atelectasis: Secondary | ICD-10-CM

## 2014-08-05 DIAGNOSIS — Z9689 Presence of other specified functional implants: Secondary | ICD-10-CM | POA: Diagnosis not present

## 2014-08-05 DIAGNOSIS — R042 Hemoptysis: Secondary | ICD-10-CM

## 2014-08-05 DIAGNOSIS — R739 Hyperglycemia, unspecified: Secondary | ICD-10-CM | POA: Diagnosis present

## 2014-08-05 DIAGNOSIS — J189 Pneumonia, unspecified organism: Secondary | ICD-10-CM | POA: Diagnosis present

## 2014-08-05 DIAGNOSIS — J939 Pneumothorax, unspecified: Secondary | ICD-10-CM

## 2014-08-05 DIAGNOSIS — Z789 Other specified health status: Secondary | ICD-10-CM

## 2014-08-05 LAB — COMPREHENSIVE METABOLIC PANEL
ALK PHOS: 68 U/L (ref 39–117)
ALT: 9 U/L (ref 0–53)
AST: 15 U/L (ref 0–37)
Albumin: 3.5 g/dL (ref 3.5–5.2)
Anion gap: 10 (ref 5–15)
BUN: 18 mg/dL (ref 6–23)
CALCIUM: 8.8 mg/dL (ref 8.4–10.5)
CO2: 26 mmol/L (ref 19–32)
Chloride: 104 mmol/L (ref 96–112)
Creatinine, Ser: 0.74 mg/dL (ref 0.50–1.35)
Glucose, Bld: 143 mg/dL — ABNORMAL HIGH (ref 70–99)
Potassium: 4.8 mmol/L (ref 3.5–5.1)
SODIUM: 140 mmol/L (ref 135–145)
Total Bilirubin: 0.7 mg/dL (ref 0.3–1.2)
Total Protein: 6.4 g/dL (ref 6.0–8.3)

## 2014-08-05 LAB — EXPECTORATED SPUTUM ASSESSMENT W GRAM STAIN, RFLX TO RESP C

## 2014-08-05 LAB — EXPECTORATED SPUTUM ASSESSMENT W REFEX TO RESP CULTURE

## 2014-08-05 MED ORDER — LIDOCAINE HCL 2 % EX GEL
1.0000 "application " | Freq: Once | CUTANEOUS | Status: DC
  Filled 2014-08-05: qty 5

## 2014-08-05 MED ORDER — PHENYLEPHRINE HCL 0.25 % NA SOLN
1.0000 | Freq: Four times a day (QID) | NASAL | Status: DC | PRN
  Filled 2014-08-05: qty 15

## 2014-08-05 MED ORDER — BUTAMBEN-TETRACAINE-BENZOCAINE 2-2-14 % EX AERO
1.0000 | INHALATION_SPRAY | Freq: Once | CUTANEOUS | Status: DC
  Filled 2014-08-05: qty 20

## 2014-08-05 MED ORDER — DEXTROSE 5 % IV SOLN
500.0000 mg | INTRAVENOUS | Status: DC
  Administered 2014-08-05: 500 mg via INTRAVENOUS
  Filled 2014-08-05 (×2): qty 500

## 2014-08-05 MED ORDER — CEFTRIAXONE SODIUM IN DEXTROSE 20 MG/ML IV SOLN
1.0000 g | INTRAVENOUS | Status: DC
  Administered 2014-08-05: 1 g via INTRAVENOUS
  Filled 2014-08-05 (×2): qty 50

## 2014-08-05 NOTE — Progress Notes (Addendum)
      [address]       [place] [address]             [phone]            Subjective: Patient hopes chest tube is removed soon.  Objective: Vital signs in last 24 hours: Temp:  [97.2 F (36.2 C)-97.7 F (36.5 C)] 97.7 F (36.5 C) (03/01 0733) Pulse Rate:  [57-128] 93 (03/01 0733) Cardiac Rhythm:  [-] Sinus tachycardia (03/01 0800) Resp:  [14-37] 19 (03/01 0733) BP: (131-161)/(86-113) 148/100 mmHg (03/01 0733) SpO2:  [91 %-100 %] 94 % (03/01 0900) FiO2 (%):  [2 %-4 %] 2 % (02/29 1558)     Intake/Output from previous day: 02/29 0701 - 03/01 0700 In: 600 [P.O.:600] Out: 270 [Urine:200; Chest Tube:70]   Physical Exam:  Cardiovascular: Slightly tachy Pulmonary: Clear to auscultation on right and slightly diminished on left; no rales, wheezes, or rhonchi. Abdomen: Soft, non tender, bowel sounds present. Extremities: No lower extremity edema. Wounds: Dressing is dry and intact. Chest Tube: to suction and NO air leak  Lab Results: CBC: Recent Labs  08/03/14 1359 08/04/14 0545  WBC 7.6 7.0  HGB 14.7 15.3  HCT 43.7 44.9  PLT 195 220   BMET:  Recent Labs  08/04/14 0545 08/05/14 0325  NA 137 140  K 4.0 4.8  CL 101 104  CO2 29 26  GLUCOSE 192* 143*  BUN 17 18  CREATININE 0.79 0.74  CALCIUM 8.9 8.8    PT/INR: No results for input(s): LABPROT, INR in the last 72 hours. ABG:  INR: Will add last result for INR, ABG once components are confirmed Will add last 4 CBG results once components are confirmed  Assessment/Plan:  1. CV - Slightly tachy in low 100's. 2.  Pulmonary - Chest tube with 70 cc output last 24 hours. Chest tube is to suction and there is NO air leak. CXR shows no pneumothorax and right lung is clear. Hope to place chest tube to water seal. Check CXR in am. Patient with history of COPD and tobacco abuse.CT done on admission showed 12x10 mm spiculated pleural density of RLL. CCM/pulmonary following and considering a  repeat CT. Also,  according to patient, bronchoscopy.   ZIMMERMAN,DONIELLE MPA-C 08/05/2014,10:11 AM  I have seen and examined the patient and agree with the assessment and plan as outlined.  Can place tube to water seal, but patient needs bronchocopy for LUL collapse.  Will need to be very conservative about chest tube management.  I spent in excess of 15 minutes during the conduct of this hospital encounter and >50% of this time involved direct face-to-face encounter with the patient for counseling and/or coordination of their care.   Myrtha Tonkovich H 08/05/2014 10:39 AM

## 2014-08-05 NOTE — Progress Notes (Signed)
No new complaints No distress  Filed Vitals:   08/05/14 0733 08/05/14 0900 08/05/14 1143 08/05/14 1248  BP: 148/100  158/104   Pulse: 93  91   Temp: 97.7 F (36.5 C)  96.5 F (35.8 C)   TempSrc: Oral  Axillary   Resp: 19  24   Height:      Weight:      SpO2: 92% 94% 94% 97%   NAD No JVD Decreased BS on L with few rhonchi RRR s M NABS, soft Ext warm without edema  I have reviewed all of today's lab results. Relevant abnormalities are discussed in the A/P section  CXR: No ptx. L lung volume loss persists   IMPRESSION Spont PTX COPD with acute exacerbation - improving Smoker Persistent volume loss on L - concern for endobronchial obstruction. Likely mucus plugging  PLAN/REC: Cont current rx Chest tube mgmt per TCTS FOB scheduled for 11:30 on 3/02 for airway exam   Merton Border, MD ; [facility] service Mobile [phone].  After 5:30 PM or weekends, call [phone]
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
## 2014-08-06 ENCOUNTER — Inpatient Hospital Stay (HOSPITAL_COMMUNITY): Payer: Medicare Other

## 2014-08-06 ENCOUNTER — Encounter (HOSPITAL_COMMUNITY)
Admission: EM | Disposition: A | Discharge status: Home / Self Care | Payer: Self-pay | Source: Home / Self Care | Attending: Internal Medicine

## 2014-08-06 ENCOUNTER — Encounter (HOSPITAL_COMMUNITY): Payer: Self-pay | Admitting: *Deleted

## 2014-08-06 DIAGNOSIS — R911 Solitary pulmonary nodule: Secondary | ICD-10-CM | POA: Diagnosis present

## 2014-08-06 DIAGNOSIS — R918 Other nonspecific abnormal finding of lung field: Secondary | ICD-10-CM | POA: Diagnosis present

## 2014-08-06 DIAGNOSIS — J9383 Other pneumothorax: Principal | ICD-10-CM

## 2014-08-06 HISTORY — PX: VIDEO BRONCHOSCOPY: SHX5072

## 2014-08-06 LAB — HEMOGLOBIN A1C
HEMOGLOBIN A1C: 5.7 % — AB (ref 4.8–5.6)
Mean Plasma Glucose: 117 mg/dL

## 2014-08-06 SURGERY — VIDEO BRONCHOSCOPY WITHOUT FLUORO
Anesthesia: Moderate Sedation | Laterality: Bilateral

## 2014-08-06 MED ORDER — LIDOCAINE HCL (PF) 1 % IJ SOLN
INTRAMUSCULAR | Status: DC | PRN
  Administered 2014-08-06: 6 mL

## 2014-08-06 MED ORDER — PHENYLEPHRINE HCL 0.25 % NA SOLN
NASAL | Status: DC | PRN
  Administered 2014-08-06: 2 via NASAL

## 2014-08-06 MED ORDER — FENTANYL CITRATE 0.05 MG/ML IJ SOLN
INTRAMUSCULAR | Status: AC
  Filled 2014-08-06: qty 4

## 2014-08-06 MED ORDER — MIDAZOLAM HCL 5 MG/ML IJ SOLN
INTRAMUSCULAR | Status: DC | PRN
  Administered 2014-08-06: 4 mg via INTRAVENOUS

## 2014-08-06 MED ORDER — MIDAZOLAM HCL 5 MG/ML IJ SOLN
INTRAMUSCULAR | Status: AC
  Filled 2014-08-06: qty 2

## 2014-08-06 MED ORDER — FENTANYL CITRATE 0.05 MG/ML IJ SOLN
INTRAMUSCULAR | Status: DC | PRN
  Administered 2014-08-06: 50 ug via INTRAVENOUS

## 2014-08-06 MED ORDER — SODIUM CHLORIDE 0.9 % IV SOLN
INTRAVENOUS | Status: DC
  Administered 2014-08-06: 11:00:00 via INTRAVENOUS

## 2014-08-06 MED ORDER — METHYLPREDNISOLONE SODIUM SUCC 40 MG IJ SOLR
40.0000 mg | Freq: Two times a day (BID) | INTRAMUSCULAR | Status: DC
  Administered 2014-08-06 – 2014-08-07 (×2): 40 mg via INTRAVENOUS
  Filled 2014-08-06 (×3): qty 1

## 2014-08-06 MED ORDER — LIDOCAINE HCL 2 % EX GEL
CUTANEOUS | Status: DC | PRN
  Administered 2014-08-06: 1

## 2014-08-06 MED ORDER — AMLODIPINE BESYLATE 10 MG PO TABS
10.0000 mg | ORAL_TABLET | Freq: Every day | ORAL | Status: DC
  Administered 2014-08-07 – 2014-08-09 (×3): 10 mg via ORAL
  Filled 2014-08-06 (×4): qty 1

## 2014-08-06 MED ORDER — IOHEXOL 300 MG/ML  SOLN
75.0000 mL | Freq: Once | INTRAMUSCULAR | Status: AC | PRN
  Administered 2014-08-06: 75 mL via INTRAVENOUS

## 2014-08-06 NOTE — Progress Notes (Signed)
Video Bronchoscopy Performed  Needle Biopsy Intervention Biopsy Intervention  Brushing Intervention  Pt tolerated well  Merton Border, MD ; [facility] service Mobile [phone].  After 5:30 PM or weekends, call [phone]

## 2014-08-06 NOTE — Op Note (Signed)
Indication:   LUL collapse   Sedation:  Midaz 4 mg Fent 50 mcg  Anesthesia: Topical to nose and throat 40 cc 1% lidocaine used during the procedure  Procedure: After adequate sedation and anesthesia, the bronchoscope was introduced via the L naris and advanced into the posterior pharynx. Upper airway anatomy was normal and cords moved symmetrically. Further anesthesia was obtained with 1% lidocaine and the scope was advanced into the trachea. Complete airway anesthesia was achieved with 1% lidocaine and a thorough airway examination was performed. This revealed the following.  Findings:  Mild diffuse bronchitis changes Near total obstructing tumor @ the orifice of the LUL bronchus  Specimens:   TBNA - 2 passes Cytology brushings - 2 passes EBBx - 4 biosies  Complications:  There was moderate bleeding that resolved spontaneously. He had moderate hypertension that resolved after completion of the procedure. There was mild, transient hypoxemia that resolved with oxygen therapy  Post procedure: Staging CTs of chest, upper abdomen and head have been ordered Will discuss with Dr Roxy Manns Have discussed with wife Will discuss with pt in afternoon    Jeffrey Border, MD;  PCCM service; Mobile [phone]

## 2014-08-06 NOTE — Progress Notes (Addendum)
[place] TEAM 1 - Stepdown/ICU TEAM Progress Note  Jeffrey Hines XBM:[id number] DOB: 18-Jan-1946 DOA: 08/03/2014 PCP: [name]  Admit HPI / Brief Narrative: 69 y.o. male with history of COPD not on home oxygen, ongoing tobacco abuse, anxiety, and HTN who presented to the [facility] ED on 08/03/14 with left-sided chest pain and dyspnea.  The patient had a violent coughing spell followed by the abrupt onset of severe left sided chest pain and dyspnea. He initially went to his PCP's walk-in clinic where O2 saturations were 90% on room air and chest x-ray showed large left pneumothorax. He was transported by ambulance to the ED where Cardiothoracic Surgery placed a left chest tube and Hospitalist admission was requested.   HPI/Subjective: Pt underwent a bronch today noting LUL bronchogenic CA.  He is comfortable in his room, but remains a bit sedate at the moment.  He denies any new complaints.  He does report intermittent streaking hemptysis post bronch.    Assessment/Plan:  L Spontaneous Pneumothorax Care per TCTS - CT remains in place   LUL bronchogenic CA  - hemoptysis  Was initially being treated as a CAP w/ intention to follow radiographically - unfortunately the LUL proved in f/u imaging to remain collapsed - this combined w/ a hx of hemptysis lead to PCCM consult for bronch which revealed evidence of bronchogenic CA - further stagin/w/u now underway per PCCM and TCTS  12 x 10 mm spiculated pleural-based density superior segment of right lower lobe  Noted via CT of chest - Radiology suggests f/u imaging in 3 months - pt tells me this has been present "for years" and that [facility] and was told it was "a scar" - I have attempted to obtain records from the [place] to no avail thus far   Acute COPD exacerbation Long-standing history of heavy tobacco abuse - has been having problems with chronic relapsing bronchitis off and on for the past  6 months, w/ occasional blood tinged sputum - no wheezing at present - wean steroids and follow   Tobacco abuse  Counseled on the absolute need to never smoke again   Anxiety  Well controlled at this time   Elevated BP Pt has no prior hx of HTN per his report - current elevated BP may simply be due to his acute illness - adjust med tx further as BP remains above goal   Steroid induced hyperglycemia A1c 5.7 i.e. not c/w DM   Code Status: FULL Family Communication: spoke w/ wife at bedside - she indicates that she wishes to discuss bronch findings w/ PCCM first  Disposition Plan: SDU  Consultants: TCTS PCCM  Procedures: 2/28 - L chest tube placement  3/2 - bronchoscopy   Antibiotics: Doxycycline 2/28 > 3/02 Azitrho 3/1 Rocephin 3/1  DVT prophylaxis: lovenox  Objective: Blood pressure 134/105, pulse 113, temperature 97 F (36.1 C), temperature source Oral, resp. rate 19, height 5\' 11"  (1.803 m), weight 81 kg (178 lb 9.2 oz), SpO2 91 %.  Intake/Output Summary (Last 24 hours) at 08/06/14 1314 Last data filed at 08/06/14 0746  Gross per 24 hour  Intake    420 ml  Output    460 ml  Net    -40 ml   Exam: General: No acute respiratory distress in bed -mildly sedate post bronch  Lungs: Clear to auscultation bilaterally without wheezes or crackles - distant BS th/o  Cardiovascular: Regular rate and rhythm without murmur gallop  or rub normal S1 and S2 Abdomen: Nontender, nondistended, soft, bowel sounds positive, no rebound, no ascites, no appreciable mass Extremities: No significant cyanosis, clubbing, or edema bilateral lower extremities  Data Reviewed: Basic Metabolic Panel:  Recent Labs Lab 08/03/14 1359 08/04/14 0545 08/05/14 0325  NA 140 137 140  K 4.7 4.0 4.8  CL 104 101 104  CO2 32 29 26  GLUCOSE 89 192* 143*  BUN 14 17 18   CREATININE 0.94 0.79 0.74  CALCIUM 9.0 8.9 8.8    Liver Function Tests:  Recent Labs Lab 08/05/14 0325  AST 15  ALT 9    ALKPHOS 68  BILITOT 0.7  PROT 6.4  ALBUMIN 3.5   CBC:  Recent Labs Lab 08/03/14 1359 08/04/14 0545  WBC 7.6 7.0  NEUTROABS 4.7  --   HGB 14.7 15.3  HCT 43.7 44.9  MCV 88.1 87.2  PLT 195 220    Recent Results (from the past 240 hour(s))  MRSA PCR Screening     Status: None   Collection Time: 08/03/14 12:32 PM  Result Value Ref Range Status   MRSA by PCR NEGATIVE NEGATIVE Final    Comment:        The GeneXpert MRSA Assay (FDA approved for NASAL specimens only), is one component of a comprehensive MRSA colonization surveillance program. It is not intended to diagnose MRSA infection nor to guide or monitor treatment for MRSA infections.   Culture, expectorated sputum-assessment     Status: None   Collection Time: 08/05/14  8:51 PM  Result Value Ref Range Status   Specimen Description SPUTUM  Final   Special Requests NONE  Final   Sputum evaluation   Final    THIS SPECIMEN IS ACCEPTABLE. RESPIRATORY CULTURE REPORT TO FOLLOW.   Report Status 08/05/2014 FINAL  Final  Culture, respiratory (NON-Expectorated)     Status: None (Preliminary result)   Collection Time: 08/05/14  8:51 PM  Result Value Ref Range Status   Specimen Description SPUTUM  Final   Special Requests NONE  Final   Gram Stain   Final    MODERATE WBC PRESENT, PREDOMINANTLY PMN FEW SQUAMOUS EPITHELIAL CELLS PRESENT FEW GRAM POSITIVE COCCI IN PAIRS IN CHAINS FEW GRAM NEGATIVE RODS Performed at [facility]    Culture   Final    NO GROWTH 1 DAY Performed at [facility]    Report Status PENDING  Incomplete     Studies:  Recent x-ray studies have been reviewed in detail by the Attending Physician  Scheduled Meds:  Scheduled Meds: . amLODipine  5 mg Oral Daily  . azithromycin  500 mg Intravenous Q24H  . cefTRIAXone (ROCEPHIN)  IV  1 g Intravenous Q24H  . clonazePAM  1 mg Oral BID  . enoxaparin (LOVENOX) injection  40 mg Subcutaneous Q24H  . famotidine  20 mg Oral BID  .  guaiFENesin  1,200 mg Oral BID  . ipratropium-albuterol  3 mL Nebulization QID  . loratadine  10 mg Oral Q12H  . methylPREDNISolone (SOLU-MEDROL) injection  60 mg Intravenous Q12H  . nicotine  14 mg Transdermal Daily  . sodium chloride  3 mL Intravenous Q12H    Time spent on care of this patient: 25 mins   [facility] T , MD   [facility] Office  [phone] Pager - Text Page per Shea Evans as per below:  On-Call/Text Page:      [email or web address]      password [email or web address]  If 7PM-7AM, please contact night-coverage [email or web address] Password  [email or web address] 08/06/2014, 1:14 PM   LOS: 3 days

## 2014-08-06 NOTE — Progress Notes (Addendum)
      [address]       [place] [address]             [phone]         Procedure(s) (LRB): VIDEO BRONCHOSCOPY WITHOUT FLUORO (Bilateral)  Subjective: Patient with complaints of productive cough that was "green in color" this am.  Objective: Vital signs in last 24 hours: Temp:  [96.5 F (35.8 C)-97.7 F (36.5 C)] 97 F (36.1 C) (03/02 0745) Pulse Rate:  [91-108] 97 (03/02 0745) Cardiac Rhythm:  [-] Sinus tachycardia (03/01 2000) Resp:  [12-24] 20 (03/02 0745) BP: (135-159)/(96-135) 155/135 mmHg (03/02 0745) SpO2:  [91 %-97 %] 91 % (03/02 0745)     Intake/Output from previous day: 03/01 0701 - 03/02 0700 In: 900 [P.O.:600; IV Piggyback:300] Out: 10 [Chest Tube:10]   Physical Exam:  Cardiovascular: RRR Pulmonary: Clear to auscultation on right and slightly diminished on left; no rales, wheezes, or rhonchi. Abdomen: Soft, non tender, bowel sounds present. Extremities: No lower extremity edema. Wounds: Dressing is dry and intact. Chest Tube: to suction and NO air leak  Lab Results: CBC:  Recent Labs  08/03/14 1359 08/04/14 0545  WBC 7.6 7.0  HGB 14.7 15.3  HCT 43.7 44.9  PLT 195 220   BMET:   Recent Labs  08/04/14 0545 08/05/14 0325  NA 137 140  K 4.0 4.8  CL 101 104  CO2 29 26  GLUCOSE 192* 143*  BUN 17 18  CREATININE 0.79 0.74  CALCIUM 8.9 8.8    PT/INR: No results for input(s): LABPROT, INR in the last 72 hours. ABG:  INR: Will add last result for INR, ABG once components are confirmed Will add last 4 CBG results once components are confirmed  Assessment/Plan:  1. CV - SR in the 90's. SBP in the 140-150's. On Norvasc 5 daily. Management per medicine. 2.  Pulmonary - Chest tube with little output last 24 hours. Chest tube is to suction and there is NO air leak. CXR this am shows no pneumothorax, atelectasis on left, and right lung is clear. Hope to place chest tube to water seal soon. Check CXR in am. Patient with  history of COPD and tobacco abuse. Scheduled for a bronch later this am for LUL collapse (could be mucous plugging). 3.ID-on Rocephin and Zithromax  ZIMMERMAN,DONIELLE MPA-C 08/06/2014,7:58 AM    I have seen and examined the patient and agree with the assessment and plan as outlined.  OWEN,CLARENCE H 08/06/2014

## 2014-08-06 NOTE — Progress Notes (Signed)
No new complaints No distress  Filed Vitals:   08/06/14 1220 08/06/14 1225 08/06/14 1230 08/06/14 1235  BP: 131/98 146/100 146/105 134/105  Pulse: 114 110 110 113  Temp:      TempSrc:      Resp: 21 20 17 19   Height:      Weight:      SpO2: 90% 88% 86% 91%   NAD No JVD Decreased BS on L with few rhonchi RRR s M NABS, soft Ext warm without edema  I have reviewed all of today's lab results. Relevant abnormalities are discussed in the A/P section  CXR: [place] LUL collapse ptx   IMPRESSION Spont PTX COPD with acute exacerbation - bronchospasm has resolved Smoker Persistent LUL atx  PLAN/REC: Cont current rx Chest tube mgmt per TCTS FOB today   Merton Border, MD ; [facility] service Mobile [phone].  After 5:30 PM or weekends, call [phone]  ADD: see bronch report - findings c/w LUL bronchogenic ca (vs carcinoid - less likely). I will inform pt and discuss with Dr Roxy Manns. Staging CT scans have been ordered  Merton Border, MD ; [facility] [phone].  After 5:30 PM or weekends, call [phone]

## 2014-08-07 ENCOUNTER — Encounter (HOSPITAL_COMMUNITY): Payer: Self-pay | Admitting: Pulmonary Disease

## 2014-08-07 ENCOUNTER — Inpatient Hospital Stay (HOSPITAL_COMMUNITY): Payer: Medicare Other

## 2014-08-07 DIAGNOSIS — J151 Pneumonia due to Pseudomonas: Secondary | ICD-10-CM

## 2014-08-07 DIAGNOSIS — F411 Generalized anxiety disorder: Secondary | ICD-10-CM

## 2014-08-07 DIAGNOSIS — C3412 Malignant neoplasm of upper lobe, left bronchus or lung: Secondary | ICD-10-CM

## 2014-08-07 DIAGNOSIS — C3492 Malignant neoplasm of unspecified part of left bronchus or lung: Secondary | ICD-10-CM

## 2014-08-07 DIAGNOSIS — J42 Unspecified chronic bronchitis: Secondary | ICD-10-CM

## 2014-08-07 DIAGNOSIS — C3490 Malignant neoplasm of unspecified part of unspecified bronchus or lung: Secondary | ICD-10-CM | POA: Diagnosis present

## 2014-08-07 DIAGNOSIS — R030 Elevated blood-pressure reading, without diagnosis of hypertension: Secondary | ICD-10-CM

## 2014-08-07 DIAGNOSIS — J449 Chronic obstructive pulmonary disease, unspecified: Secondary | ICD-10-CM | POA: Diagnosis present

## 2014-08-07 DIAGNOSIS — IMO0001 Reserved for inherently not codable concepts without codable children: Secondary | ICD-10-CM | POA: Diagnosis present

## 2014-08-07 DIAGNOSIS — J9311 Primary spontaneous pneumothorax: Secondary | ICD-10-CM

## 2014-08-07 DIAGNOSIS — J441 Chronic obstructive pulmonary disease with (acute) exacerbation: Secondary | ICD-10-CM

## 2014-08-07 HISTORY — DX: Pneumonia due to Pseudomonas: J15.1

## 2014-08-07 LAB — BLOOD GAS, ARTERIAL
ACID-BASE EXCESS: 6.4 mmol/L — AB (ref 0.0–2.0)
BICARBONATE: 30.7 meq/L — AB (ref 20.0–24.0)
Drawn by: 10552
O2 Content: 4 L/min
O2 SAT: 94.6 %
PATIENT TEMPERATURE: 98.6
TCO2: 32.2 mmol/L (ref 0–100)
pCO2 arterial: 47.2 mmHg — ABNORMAL HIGH (ref 35.0–45.0)
pH, Arterial: 7.429 (ref 7.350–7.450)
pO2, Arterial: 74.5 mmHg — ABNORMAL LOW (ref 80.0–100.0)

## 2014-08-07 MED ORDER — IOHEXOL 300 MG/ML  SOLN
25.0000 mL | INTRAMUSCULAR | Status: AC
  Administered 2014-08-07 (×2): 25 mL via ORAL

## 2014-08-07 MED ORDER — PIPERACILLIN-TAZOBACTAM 3.375 G IVPB
3.3750 g | Freq: Three times a day (TID) | INTRAVENOUS | Status: DC
Start: 2014-08-07 — End: 2014-08-08
  Administered 2014-08-07 – 2014-08-08 (×3): 3.375 g via INTRAVENOUS
  Filled 2014-08-07 (×5): qty 50

## 2014-08-07 MED ORDER — METHYLPREDNISOLONE SODIUM SUCC 40 MG IJ SOLR
40.0000 mg | INTRAMUSCULAR | Status: DC
  Administered 2014-08-08: 40 mg via INTRAVENOUS
  Filled 2014-08-07: qty 1

## 2014-08-07 MED ORDER — PIPERACILLIN-TAZOBACTAM 3.375 G IVPB
3.3750 g | Freq: Three times a day (TID) | INTRAVENOUS | Status: DC
  Administered 2014-08-07: 3.375 g via INTRAVENOUS
  Filled 2014-08-07 (×5): qty 50

## 2014-08-07 MED ORDER — GADOBENATE DIMEGLUMINE 529 MG/ML IV SOLN
15.0000 mL | Freq: Once | INTRAVENOUS | Status: AC | PRN
  Administered 2014-08-07: 15 mL via INTRAVENOUS

## 2014-08-07 MED ORDER — LEVOFLOXACIN 750 MG PO TABS
750.0000 mg | ORAL_TABLET | Freq: Every day | ORAL | Status: DC
  Administered 2014-08-07 – 2014-08-09 (×3): 750 mg via ORAL
  Filled 2014-08-07 (×4): qty 1

## 2014-08-07 MED ORDER — IOHEXOL 300 MG/ML  SOLN
100.0000 mL | Freq: Once | INTRAMUSCULAR | Status: AC | PRN
  Administered 2014-08-07: 100 mL via INTRAVENOUS

## 2014-08-07 NOTE — Progress Notes (Signed)
Patient out of room x 2 attempts. RD to follow-up tomorrow morning for nutrition assessment.   Pryor Ochoa RD, LDN Inpatient Clinical Dietitian Pager: [phone] After Hours Pager: [phone]

## 2014-08-07 NOTE — Progress Notes (Signed)
[address]       [place] [address]             [phone]      CARDIOTHORACIC SURGERY PROGRESS NOTE  1 Day Post-Op  S/P Procedure(s) (LRB): VIDEO BRONCHOSCOPY WITHOUT FLUORO (Bilateral)  Subjective: Complains of wheezing.  Not dyspneic at rest. + cough but sputum decreased.  No hemoptysis overnight  Objective: Vital signs in last 24 hours: Temp:  [97.6 F (36.4 C)-98.2 F (36.8 C)] 98.1 F (36.7 C) (03/03 0742) Pulse Rate:  [95-122] 96 (03/03 0742) Cardiac Rhythm:  [-] Normal sinus rhythm;Sinus tachycardia (03/02 2300) Resp:  [10-25] 25 (03/03 0742) BP: (124-203)/(94-154) 124/110 mmHg (03/03 0742) SpO2:  [85 %-96 %] 93 % (03/03 0742) FiO2 (%):  [28 %-36 %] 36 % (03/02 1626)  Physical Exam:  Rhythm:   sinus  Breath sounds: Good air movement on left side.  Inspiratory and expiratory wheezing.  Heart sounds:  RRR  Incisions:  n/a  Abdomen:  soft  Extremities:  warm  Chest tube(s):  Trivial output.  No air leak   Intake/Output from previous day: 03/02 0701 - 03/03 0700 In: 240 [P.O.:240] Out: 1000 [Urine:1000] Intake/Output this shift: Total I/O In: -  Out: 750 [Urine:750]  Lab Results: No results for input(s): WBC, HGB, HCT, PLT in the last 72 hours. BMET:  Recent Labs  08/05/14 0325  NA 140  K 4.8  CL 104  CO2 26  GLUCOSE 143*  BUN 18  CREATININE 0.74  CALCIUM 8.8    CBG (last 3)  No results for input(s): GLUCAP in the last 72 hours.  CXR:  Not done   CT CHEST WITH CONTRAST  TECHNIQUE: Multidetector CT imaging of the chest was performed during intravenous contrast administration.  CONTRAST: 75 mL OMNIPAQUE IOHEXOL 300 MG/ML SOLN  COMPARISON: CT chest 08/03/2014.  FINDINGS: No axillary, hilar or mediastinal lymphadenopathy is identified. There is a small left pleural effusion. No right pleural effusion. Tiny amount of pericardial fluid is noted.  Lungs demonstrate a left chest tube in place. Left  pneumothorax seen on the comparison examination is much smaller. Dense opacity is present in the left upper lobe consistent with postobstructive atelectasis. Although somewhat difficult to discretely visualize due to atelectasis, a lesion at the level of the left main pulmonary artery in the left upper lobe bronchus measures 2.2 cm AP x 2.3 cm transverse on axial image 27 x 3.7 cm craniocaudal on coronal image 53. The small subpleural opacity in the right lower lobe seen on the comparison CT scan which had measured 1.0 x 1.2 cm is barely perceptible today and was likely due to atelectasis. No other pulmonary nodule or mass is seen. Emphysematous disease is noted.  No evidence of metastatic disease is seen in the imaged upper abdomen. No focal bony abnormality is identified.  IMPRESSION: Left upper lobe collapse secondary to a mass lesion in the left upper lobe bronchus as described above.  Marked decrease in left pneumothorax with a chest tube in place.  Small subpleural opacity in the right lower lobe seen on the comparison CT scan is is more difficult to appreciate on today's study and was likely secondary atelectasis.  Small left pleural effusion.  Note is made that review of the patient's chart indicates he was to have a CT chest, abdomen and pelvis for staging. This was discussed with the technologist who reported only a CT chest border was received.   Electronically Signed  By: Inge Rise M.D.  On: 08/06/2014 17:07   CT HEAD WITHOUT AND WITH CONTRAST  TECHNIQUE: Contiguous axial images were obtained from the base of the skull through the vertex without and with intravenous contrast  CONTRAST: 75mL OMNIPAQUE IOHEXOL 300 MG/ML SOLN  COMPARISON: CT chest with contrast from the same day.  FINDINGS: Extensive subcortical white matter changes are evident bilaterally. While this could represent vasogenic edema, there is no focal enhancement to  suggest a discrete metastasis.  The ventricles are of normal size No significant extraaxial fluid collection is present.  The cavernous sinus is normal bilaterally. Globes and orbits are intact.  The paranasal sinuses and the mastoid air cells are clear. Calvarium is unremarkable.  IMPRESSION: 1. Extensive subcortical white matter hypoattenuation bilaterally. This likely reflects the sequela of chronic microvascular ischemia. 2. No discrete enhancement to suggest metastatic disease. MRI of the brain without and with contrast is more sensitive for small lesions.   Electronically Signed  By: San Morelle M.D.  On: 08/06/2014 17:04  Assessment/Plan: S/P Procedure(s) (LRB): VIDEO BRONCHOSCOPY WITHOUT FLUORO (Bilateral)  Jeffrey Hines remains clinically stable w/ respect to spontaneous PTX.  No air leak on exam.  CXR not done this morning.  Hopefully we might be able to get his tube out soon.  Bronchoscopy and CT confirm original clinical suspicion for lung cancer.  Endobronchial biopsies pending.  Unfortunately, he has a fairly large central mass causing nearly complete obstruction of the LUL bronchus with post-obstruction pneumonia.  Cultures reveal Pseudomonas aeruginosa, sensitivities pending.  He is not currently on antibiotics for unclear reasons.  Based upon bronchoscopy and CT findings, at present Jeffrey Hines require left pneumonectomy if he were to undergo an attempt at surgical resection.  He may not have adequate lung function to tolerate pneumonectomy.  Neo-adjuvant chemotherapy and radiation therapy may be most appropriate initial Rx.  I recommend:  1.  Antibiotic Rx for post-obstruction pneumonia 2.  Baseline PFT's and ABG 3.  MRI brain (r/o mets - CT head inadequate) 4.  Nutrition consult - I have ordered CMET and prealbumin for tomorrow 5.  Dental service consult - he will need dental extraction prior to any surgery and/or chemotherapy 6.  PET-CT scan  to complete his staging  Discussed w/ Dr Roxan Hockey.  Hopefully we can get his chest tube out successfully w/in another day or two.  If so the patient could be discharged from the hospital and have PET-CT as an outpatient.  He could then be seen in [place] once PET-CT has been completed to initiate plan for treatment as soon as possible.  I spent in excess of 30 minutes during the conduct of this hospital encounter and >50% of this time involved direct face-to-face encounter with the patient for counseling and/or coordination of their care.    Jeffrey Hines H 08/07/2014 8:24 AM

## 2014-08-07 NOTE — Progress Notes (Signed)
ANTIBIOTIC CONSULT NOTE - INITIAL  Pharmacy Consult for zosyn Indication: pneumonia  Allergies  Allergen Reactions  . Advair Diskus [Fluticasone-Salmeterol] Anxiety    Patient Measurements: Height: 5\' 11"  (180.3 cm) Weight: 178 lb 9.2 oz (81 kg) IBW/kg (Calculated) : 75.3 Adjusted Body Weight:   Vital Signs: Temp: 98.1 F (36.7 C) (03/03 0742) Temp Source: Oral (03/03 0742) BP: 124/110 mmHg (03/03 0742) Pulse Rate: 96 (03/03 0742) Intake/Output from previous day: 03/02 0701 - 03/03 0700 In: 240 [P.O.:240] Out: 1000 [Urine:1000] Intake/Output from this shift: Total I/O In: -  Out: 750 [Urine:750]  Labs:  Recent Labs  08/05/14 0325  CREATININE 0.74   Medical History: Past Medical History  Diagnosis Date  . COPD (chronic obstructive pulmonary disease)   . Closed head injury 1998  . Multiple rib fractures 1998    left side  . Shoulder dislocation 1998    left  . COPD with chronic bronchitis   . Tobacco abuse   . Hypertension   . Spontaneous pneumothorax 08/03/2014    Left side 1st time episode spontaneous pneumothorax associated with acute flare of COPD   . Hemoptysis 08/03/2014    Medications:  See EMR  Assessment: 69 yo male with long standing COPD. Admitted with dsypnea, chest pain,  spontaneous pneumothorax and abnormal pleural density on CXR. Pt required chest tube and bronchoscopy. Bronchoscopy showed obstruction in the LUL -concern for malignancy. Pharmacy is consulted to start Zosyn for a post-obstruction pneumonia. Respiratory culture is growing pseudomonas and a few GPC.  SCr <1, eCrCl > 90 ml/min.   Goal of Therapy:  Resolution of infection  Plan:  Zosyn 3.375 g IV q8h Watch renal fx, cultures and sensitivities, narrow as appropriate  Hughes Better, PharmD, BCPS Clinical Pharmacist Pager: [phone] 08/07/2014 8:40 AM

## 2014-08-07 NOTE — Progress Notes (Signed)
No new complaints No distress  Filed Vitals:   08/07/14 0742 08/07/14 1011 08/07/14 1044 08/07/14 1203  BP: 124/110 144/97  157/101  Pulse: 96   90  Temp: 98.1 F (36.7 C)   98.6 F (37 C)  TempSrc: Oral   Oral  Resp: 25   21  Height:      Weight:      SpO2: 93%  94% 92%   Exam: NAD No JVD Decreased BS, no wheezes RRR s M NABS, soft Ext warm without edema  I have reviewed all of today's lab results. 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 Please call if we can be of further assistance  Merton Border, MD ; [facility] [phone].  After 5:30 PM or weekends, call [phone]
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
## 2014-08-08 ENCOUNTER — Inpatient Hospital Stay (HOSPITAL_COMMUNITY): Payer: Medicare Other

## 2014-08-08 ENCOUNTER — Encounter (HOSPITAL_COMMUNITY): Payer: Self-pay | Admitting: Dentistry

## 2014-08-08 ENCOUNTER — Telehealth: Payer: Self-pay | Admitting: *Deleted

## 2014-08-08 ENCOUNTER — Other Ambulatory Visit: Payer: Self-pay | Admitting: *Deleted

## 2014-08-08 DIAGNOSIS — K045 Chronic apical periodontitis: Secondary | ICD-10-CM

## 2014-08-08 DIAGNOSIS — E440 Moderate protein-calorie malnutrition: Secondary | ICD-10-CM | POA: Diagnosis present

## 2014-08-08 DIAGNOSIS — R918 Other nonspecific abnormal finding of lung field: Secondary | ICD-10-CM

## 2014-08-08 DIAGNOSIS — K0530 Chronic periodontitis, unspecified: Secondary | ICD-10-CM

## 2014-08-08 DIAGNOSIS — C3492 Malignant neoplasm of unspecified part of left bronchus or lung: Secondary | ICD-10-CM

## 2014-08-08 LAB — COMPREHENSIVE METABOLIC PANEL
ALT: 66 U/L — ABNORMAL HIGH (ref 0–53)
ANION GAP: 5 (ref 5–15)
AST: 29 U/L (ref 0–37)
Albumin: 3.2 g/dL — ABNORMAL LOW (ref 3.5–5.2)
Alkaline Phosphatase: 55 U/L (ref 39–117)
BILIRUBIN TOTAL: 0.7 mg/dL (ref 0.3–1.2)
BUN: 25 mg/dL — AB (ref 6–23)
CHLORIDE: 101 mmol/L (ref 96–112)
CO2: 33 mmol/L — ABNORMAL HIGH (ref 19–32)
Calcium: 8.6 mg/dL (ref 8.4–10.5)
Creatinine, Ser: 0.87 mg/dL (ref 0.50–1.35)
GFR calc Af Amer: >90 mL/min (ref >90)
GFR calc non Af Amer: 87 mL/min — ABNORMAL LOW (ref >90)
Glucose, Bld: 96 mg/dL (ref 70–99)
POTASSIUM: 4.3 mmol/L (ref 3.5–5.1)
SODIUM: 139 mmol/L (ref 135–145)
Total Protein: 6.2 g/dL (ref 6.0–8.3)

## 2014-08-08 LAB — CBC
HEMATOCRIT: 43.7 % (ref 39.0–52.0)
Hemoglobin: 14.1 g/dL (ref 13.0–17.0)
MCH: 29.6 pg (ref 26.0–34.0)
MCHC: 32.3 g/dL (ref 30.0–36.0)
MCV: 91.8 fL (ref 78.0–100.0)
Platelets: 220 10*3/uL (ref 150–400)
RBC: 4.76 MIL/uL (ref 4.22–5.81)
RDW: 13.4 % (ref 11.5–15.5)
WBC: 11.1 10*3/uL — AB (ref 4.0–10.5)

## 2014-08-08 LAB — CULTURE, RESPIRATORY W GRAM STAIN

## 2014-08-08 LAB — PULMONARY FUNCTION TEST
DL/VA % pred: 55 %
DL/VA: 2.6 ml/min/mmHg/L
DLCO COR: 11.29 ml/min/mmHg
DLCO UNC: 11.13 ml/min/mmHg
DLCO cor % pred: 33 %
DLCO unc % pred: 33 %
FEF 25-75 Post: 0.97 L/sec
FEF 25-75 Pre: 0.61 L/sec
FEF2575-%Change-Post: 59 %
FEF2575-%Pred-Post: 36 %
FEF2575-%Pred-Pre: 22 %
FEV1-%Change-Post: 18 %
FEV1-%PRED-POST: 49 %
FEV1-%Pred-Pre: 41 %
FEV1-POST: 1.71 L
FEV1-Pre: 1.44 L
FEV1FVC-%Change-Post: 0 %
FEV1FVC-%Pred-Pre: 66 %
FEV6-%Change-Post: 17 %
FEV6-%PRED-PRE: 64 %
FEV6-%Pred-Post: 75 %
FEV6-POST: 3.33 L
FEV6-PRE: 2.84 L
FEV6FVC-%Change-Post: 0 %
FEV6FVC-%PRED-POST: 101 %
FEV6FVC-%Pred-Pre: 102 %
FVC-%CHANGE-POST: 17 %
FVC-%PRED-PRE: 62 %
FVC-%Pred-Post: 73 %
FVC-Post: 3.44 L
FVC-Pre: 2.93 L
POST FEV6/FVC RATIO: 97 %
PRE FEV6/FVC RATIO: 97 %
Post FEV1/FVC ratio: 50 %
Pre FEV1/FVC ratio: 49 %
RV % pred: 131 %
RV: 3.23 L
TLC % pred: 93 %
TLC: 6.73 L

## 2014-08-08 LAB — PREALBUMIN: Prealbumin: 25.2 mg/dL (ref 17.0–34.0)

## 2014-08-08 LAB — CULTURE, RESPIRATORY

## 2014-08-08 MED ORDER — ADULT MULTIVITAMIN W/MINERALS CH
1.0000 | ORAL_TABLET | Freq: Every day | ORAL | Status: DC
  Administered 2014-08-08 – 2014-08-09 (×2): 1 via ORAL
  Filled 2014-08-08 (×3): qty 1

## 2014-08-08 MED ORDER — ENSURE COMPLETE PO LIQD
237.0000 mL | ORAL | Status: DC
  Administered 2014-08-08: 237 mL via ORAL

## 2014-08-08 NOTE — Telephone Encounter (Signed)
Called patient to schedule for thoracic clinic on 08/14/14 arrival at 2:30.  Patient and wife verbalized understanding of appt time and place.

## 2014-08-08 NOTE — Progress Notes (Signed)
CT D/C'd per order. Pt tolerated procedure well. Report given to bedside RN.  CXR to be called.

## 2014-08-08 NOTE — Progress Notes (Addendum)
Jeffrey Hines       [place],Jeffrey Hines             [phone]      CARDIOTHORACIC SURGERY PROGRESS NOTE  2 Days Post-Op  S/P Procedure(s) (LRB): VIDEO BRONCHOSCOPY WITHOUT FLUORO (Bilateral)  Subjective: No complaints.  No SOB.  Cough improved  Objective: Vital signs in last 24 hours: Temp:  [97.8 F (36.6 C)-98.6 F (37 C)] 98.1 F (36.7 C) (03/04 0748) Pulse Rate:  [69-113] 69 (03/04 0748) Cardiac Rhythm:  [-] Normal sinus rhythm;Sinus tachycardia (03/03 2000) Resp:  [15-26] 15 (03/04 0748) BP: (111-157)/(80-106) 142/81 mmHg (03/04 0748) SpO2:  [92 %-98 %] 97 % (03/04 0748) Weight:  [79.2 kg (174 lb 9.7 oz)] 79.2 kg (174 lb 9.7 oz) (03/04 0500)  Physical Exam:  Rhythm:   sinus  Breath sounds: Wheezes and rhonchi on left  Heart sounds:  RRR  Incisions:  n/a  Abdomen:  soft  Extremities:  warm  Chest tube(s):  Trivial output, no air leak   Intake/Output from previous day: 03/03 0701 - 03/04 0700 In: 1710 [P.O.:1610; IV Piggyback:100] Out: 3625 [Urine:3575; Chest Tube:50] Intake/Output this shift: Total I/O In: -  Out: 200 [Urine:200]  Lab Results:  Recent Labs  08/08/14 0401  WBC 11.1*  HGB 14.1  HCT 43.7  PLT 220   BMET:  Recent Labs  08/08/14 0401  NA 139  K 4.3  CL 101  CO2 33*  GLUCOSE 96  BUN 25*  CREATININE 0.87  CALCIUM 8.6    CBG (last 3)  No results for input(s): GLUCAP in the last 72 hours.  CXR:  CHEST 2 VIEW  COMPARISON: Chest CT August 06, 2014 and chest radiograph August 07, 2014  FINDINGS: Chest tube remains on the left, unchanged in position. Small left apical pneumothorax persists without change. There is consolidation in the left upper lobe with soft tissue fullness in the upper left hilum. Right lung is clear. There is underlying emphysema. Heart size is normal. The pulmonary vascularity is stable. No new opacity. No bone lesions.  IMPRESSION: Stable pneumothorax on the left with chest  tube in place. Stable consolidation in soft tissue fullness on the left in the upper lobe/apical region. No new opacity. Underlying emphysema. No change in cardiac silhouette.   Electronically Signed  By: Lowella Grip III M.D.  On: 08/08/2014 07:56  Assessment/Plan: S/P Procedure(s) (LRB): VIDEO BRONCHOSCOPY WITHOUT FLUORO (Bilateral)  Patient remains clinically stable.  CXR stable w/ persistent LUL collapse and small apical PTX.  Jeffrey Hines has never had an air leak since chest tube placed.    Endobronchial biopsies c/w squamous cell carcinoma.  So far staging work up remains negative for distant metastases.  PFT's pending.  Dental consult pending.  Will d/c tube today.  He potentially could go home tomorrow if repeat CXR remains stable.  Will arrange for f/u in [place] next Thursday if PET-CT scan can be performed earlier in the week.  I have discussed the implications of Jeffrey Hines's diagnosis and staging work up at length with the patient and his wife.  All questions answered.   I spent in excess of 30 minutes during the conduct of this hospital encounter and >50% of this time involved direct face-to-face encounter with the patient for counseling and/or coordination of their care.    Jeffrey Hines 08/08/2014 8:30 AM   Jeffrey Hines is scheduled for a PET-CT scan on Wednesday March 9th and appointment in  the [place] on Thursday March 10th.  He will have follow up CXR done when he goes for appointment at [facility].  Jeffrey Hines 08/08/2014 1:15 PM

## 2014-08-08 NOTE — Progress Notes (Signed)
[place] TEAM 1 - Stepdown/ICU TEAM Progress Note  Jeffrey Hines OEV:[id number] DOB: 1945/08/02 DOA: 08/03/2014 PCP: [name]  Admit HPI / Brief Narrative: 69 y.o. male with history of COPD not on home oxygen, ongoing tobacco abuse, anxiety, and HTN who presented to the [facility] ED on 08/03/14 with left-sided chest pain and dyspnea.  The patient had a violent coughing spell followed by the abrupt onset of severe left sided chest pain and dyspnea. He initially went to his PCP's walk-in clinic where O2 saturations were 90% on room air and chest x-ray showed large left pneumothorax. He was transported by ambulance to the ED where Cardiothoracic Surgery placed a left chest tube and Hospitalist admission was requested.   HPI/Subjective: Resting comfortably in bed.  Anxious to have his CT pulled out.  Denies sob, n/v, wheeze, cp, or HA.    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 superior segment of right lower lobe  Noted via CT of chest - Radiology suggests f/u imaging in 3 months - pt tells me this has been present "for years" and that [facility] and was told it was "a scar" - I have attempted to obtain records from the [place] to no avail thus far   Acute COPD exacerbation Long-standing history of heavy tobacco abuse - has been having problems with chronic relapsing bronchitis off and on for the past 6 months, w/ occasional blood tinged sputum - no wheezing at present - wean steroids and  follow - will likely require home O2 at d/c - will obtain formal testing   Pseudomonas bronchitis  Levaquin per PCCM for 7 day course - should also cover for post-obstructive PNA  Tobacco abuse  Counseled on the absolute need to never smoke again   Anxiety  Well controlled at this time   Elevated BP Pt has no prior hx of HTN per his report - BP currently well controlled   Steroid induced hyperglycemia A1c 5.7 i.e. not c/w DM   Code Status: FULL Family Communication: spoke w/ wife at bedside  Disposition Plan: SDU - will not transfer as pt likely to be d/c home in AM   Consultants: TCTS PCCM  Procedures: 2/28 - L chest tube placement  3/2 - bronchoscopy   Antibiotics: Doxycycline 2/28 > 3/02 Azitrho 3/1 Rocephin 3/1 Zosyn 3/3 > 3/4 Levaquin 3/3 >  DVT prophylaxis: lovenox  Objective: Blood pressure 113/75, pulse 95, temperature 97.5 F (36.4 C), temperature source Oral, resp. rate 20, height 5\' 11"  (1.803 m), weight 79.2 kg (174 lb 9.7 oz), SpO2 95 %.  Intake/Output Summary (Last 24 hours) at 08/08/14 1527 Last data filed at 08/08/14 1247  Gross per 24 hour  Intake    703 ml  Output   1925 ml  Net  -1222 ml   Exam: General: No acute respiratory distress in bed  Lungs: Clear to auscultation bilaterally without wheezes or crackles - distant BS th/o  Cardiovascular: Regular  rate and rhythm without murmur gallop or rub normal S1 and S2 Abdomen: Nontender, nondistended, soft, bowel sounds positive, no rebound, no ascites, no appreciable mass Extremities: No significant cyanosis, clubbing, or edema bilateral lower extremities  Data Reviewed: Basic Metabolic Panel:  Recent Labs Lab 08/03/14 1359 08/04/14 0545 08/05/14 0325 08/08/14 0401  NA 140 137 140 139  K 4.7 4.0 4.8 4.3  CL 104 101 104 101  CO2 32 29 26 33*  GLUCOSE 89 192* 143* 96  BUN 14 17 18  25*  CREATININE 0.94 0.79 0.74 0.87  CALCIUM 9.0 8.9 8.8 8.6    Liver Function Tests:  Recent  Labs Lab 08/05/14 0325 08/08/14 0401  AST 15 29  ALT 9 66*  ALKPHOS 68 55  BILITOT 0.7 0.7  PROT 6.4 6.2  ALBUMIN 3.5 3.2*   CBC:  Recent Labs Lab 08/03/14 1359 08/04/14 0545 08/08/14 0401  WBC 7.6 7.0 11.1*  NEUTROABS 4.7  --   --   HGB 14.7 15.3 14.1  HCT 43.7 44.9 43.7  MCV 88.1 87.2 91.8  PLT 195 220 220    Recent Results (from the past 240 hour(s))  MRSA PCR Screening     Status: None   Collection Time: 08/03/14 12:32 PM  Result Value Ref Range Status   MRSA by PCR NEGATIVE NEGATIVE Final    Comment:        The GeneXpert MRSA Assay (FDA approved for NASAL specimens only), is one component of a comprehensive MRSA colonization surveillance program. It is not intended to diagnose MRSA infection nor to guide or monitor treatment for MRSA infections.   Culture, blood (routine x 2)     Status: None (Preliminary result)   Collection Time: 08/05/14  8:01 PM  Result Value Ref Range Status   Specimen Description BLOOD LEFT HAND  Final   Special Requests BOTTLES DRAWN AEROBIC AND ANAEROBIC 10CC  Final   Culture   Final           BLOOD CULTURE RECEIVED NO GROWTH TO DATE CULTURE WILL BE HELD FOR 5 DAYS BEFORE ISSUING A FINAL NEGATIVE REPORT Note: Culture results may be compromised due to an excessive volume of blood received in culture bottles. Performed at [facility]    Report Status PENDING  Incomplete  Culture, blood (routine x 2)     Status: None (Preliminary result)   Collection Time: 08/05/14  8:07 PM  Result Value Ref Range Status   Specimen Description BLOOD RIGHT ARM  Final   Special Requests BOTTLES DRAWN AEROBIC AND ANAEROBIC 10CC  Final   Culture   Final           BLOOD CULTURE RECEIVED NO GROWTH TO DATE CULTURE WILL BE HELD FOR 5 DAYS BEFORE ISSUING A FINAL NEGATIVE REPORT Performed at [facility]    Report Status PENDING  Incomplete  Culture, expectorated sputum-assessment     Status: None   Collection Time: 08/05/14  8:51 PM   Result Value Ref Range Status   Specimen Description SPUTUM  Final   Special Requests NONE  Final   Sputum evaluation   Final    THIS SPECIMEN IS ACCEPTABLE. RESPIRATORY CULTURE REPORT TO FOLLOW.   Report Status 08/05/2014 FINAL  Final  Culture, respiratory (NON-Expectorated)     Status: None   Collection Time: 08/05/14  8:51 PM  Result Value Ref Range Status   Specimen Description SPUTUM  Final   Special Requests NONE  Final   Gram Stain  Final    MODERATE WBC PRESENT, PREDOMINANTLY PMN FEW SQUAMOUS EPITHELIAL CELLS PRESENT FEW GRAM POSITIVE COCCI IN PAIRS IN CHAINS FEW GRAM NEGATIVE RODS Performed at [facility]    Culture   Final    MODERATE PSEUDOMONAS AERUGINOSA Performed at [facility]    Report Status 08/08/2014 FINAL  Final   Organism ID, Bacteria PSEUDOMONAS AERUGINOSA  Final      Susceptibility   Pseudomonas aeruginosa - MIC*    CEFEPIME 2 SENSITIVE Sensitive     CEFTAZIDIME 4 SENSITIVE Sensitive     CIPROFLOXACIN <=0.25 SENSITIVE Sensitive     GENTAMICIN <=1 SENSITIVE Sensitive     IMIPENEM 2 SENSITIVE Sensitive     PIP/TAZO 8 SENSITIVE Sensitive     TOBRAMYCIN <=1 SENSITIVE Sensitive     * MODERATE PSEUDOMONAS AERUGINOSA     Studies:  Recent x-ray studies have been reviewed in detail by the Attending Physician  Scheduled Meds:  Scheduled Meds: . amLODipine  10 mg Oral Daily  . clonazePAM  1 mg Oral BID  . enoxaparin (LOVENOX) injection  40 mg Subcutaneous Q24H  . famotidine  20 mg Oral BID  . feeding supplement (ENSURE COMPLETE)  237 mL Oral Q24H  . guaiFENesin  1,200 mg Oral BID  . ipratropium-albuterol  3 mL Nebulization QID  . levofloxacin  750 mg Oral Daily  . loratadine  10 mg Oral Q12H  . methylPREDNISolone (SOLU-MEDROL) injection  40 mg Intravenous Q24H  . multivitamin with minerals  1 tablet Oral Daily  . nicotine  14 mg Transdermal Daily  . piperacillin-tazobactam (ZOSYN)  IV  3.375 g Intravenous 3 times per day  .  sodium chloride  3 mL Intravenous Q12H    Time spent on care of this patient: 35 mins   Adellyn Capek T , MD   [facility] Office  [phone] Pager - Text Page per Shea Evans as per below:  On-Call/Text Page:      [email or web address]      password [email or web address]  If 7PM-7AM, please contact night-coverage [email or web address] Password [email or web address] 08/08/2014, 3:27 PM   LOS: 5 days
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
## 2014-08-09 ENCOUNTER — Inpatient Hospital Stay (HOSPITAL_COMMUNITY): Payer: Medicare Other

## 2014-08-09 ENCOUNTER — Encounter (HOSPITAL_COMMUNITY): Payer: Self-pay | Admitting: Thoracic Surgery (Cardiothoracic Vascular Surgery)

## 2014-08-09 MED ORDER — LEVOFLOXACIN 750 MG PO TABS: 750.0000 mg | ORAL_TABLET | Freq: Every day | ORAL | Status: DC

## 2014-08-09 MED ORDER — AMLODIPINE BESYLATE 10 MG PO TABS: 10.0000 mg | ORAL_TABLET | Freq: Every day | ORAL | Status: DC

## 2014-08-09 MED ORDER — HYDROCODONE-ACETAMINOPHEN 5-325 MG PO TABS: 1.0000 | ORAL_TABLET | ORAL | Status: DC | PRN

## 2014-08-09 NOTE — Progress Notes (Signed)
OXYGEN TITRATION NOTE  Pt was placed on RA during MD visit.  He was in no acute resp distress.  On RA, his O2 sats dropped to 86% sustained.  He was placed back on 2LPM, with which his sats were consistently >90%.  He will be prescribed home O2 at 2LPM at all times.  Cherene Altes, MD [facility] For Consults/Admissions - Flow Manager [phone] Office  [phone]  Contact MD directly via text page:      [email or web address]      password [facility]

## 2014-08-09 NOTE — Discharge Summary (Signed)
DISCHARGE SUMMARY  Jeffrey Hines  MR#: [id number]  DOB:[birth year]  Date of Admission: 08/03/2014 Date of Discharge: 08/09/2014  Attending Physician:Teigan Sahli T  Patient's JWJ:[email or web address],[email or web address], PA-C  Consults: [facility] PCCM  Disposition: D/C home w/ wife   Follow-up Appts:     Follow-up Information    Follow up with Lenn Cal, DDS.   Specialty:  Dentistry   Why:  Exam, radiographs and discussion of treatment plan in OR.   Contact information:   [place] [address] [phone]       Follow up with [facility] On 08/14/2014.   Why:  Appointment is at 4   Contact information:   [address] [place] [phone]       Follow up with Tora Duck On 08/14/2014.   Why:  please get CXR at 3:00 prior to appointment at [facility] information:   [facility]       Follow up with PET CT On 08/13/2014.   Why:  Appointment is at 12:00.Marland Kitchen... Do not take morning insulin and please do not eat or drink anything after 6 AM      Follow up with [name],[name], PA-C. Schedule an appointment as soon as possible for a visit in 1 week.   Specialty:  Family Medicine   Contact information:   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 male with history of COPD not on home oxygen, ongoing tobacco abuse, anxiety, and HTN who presented to the [facility] ED on 08/03/14 with left-sided chest  pain and dyspnea. The patient had a violent coughing spell followed by the abrupt onset of severe left sided chest pain and dyspnea. He initially went to his PCP's walk-in clinic where O2 saturations were 90% on room air and chest x-ray showed large left pneumothorax. He was transported by ambulance to the ED where Cardiothoracic Surgery placed a left chest tube and Hospitalist admission was requested.   Hospital Course:  L Spontaneous Pneumothorax Care per [facility] - CT removed 3/4 evening - f/u CXR stable - cleared for d/c home per [facility] - f/u has been arranged   LUL bronchogenic CA -  squamous cell  Was initially being treated as a CAP w/ intention to follow radiographically - unfortunately the LUL proved in f/u imaging to remain collapsed - this combined w/ a hx of hemptysis lead to PCCM consult - PCCM took the pt for bronch which revealed evidence of bronchogenic CA, w/ path results confirming a squamous cell type - subsequent CT chest noted a mass lesion in the LUL bronchus but no other pulmonary lesions - MRI of the brain noted no evidence of intracranial mets - CT abdom/pelvis revealed no evidence of metastatic disease - an orthopantogram was accomplished noting periapical lucency involving the left lower lateral incisor - Dental Medicine saw the pt, and feel he will require extensive dental work prior to XRT/Chemo - the pt is to f/u w/ Dr. Enrique Sack per the  instructions he provided to the pt once the pt decides from the option presented to him - ongoing care is to be coordinated through the office of Dr. 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  Noted via CT of chest - Radiology suggests f/u imaging in 3 months - pt tells me this has been present "for years" and that he had a prior biopsy via the [place] [facility] and was  told it was "a scar" - I attempted to obtain records from the [place] to no avail thus far - this lesion was not appreciated on the second of his CT scans this admit   Acute COPD exacerbation Long-standing history of heavy tobacco abuse - has been having problems with chronic relapsing bronchitis off and on for the past 6 months, w/ occasional blood tinged sputum - no wheezing at time of d/c - weaned steroids - will require home O2 at d/c as desats <88% on RA   Pseudomonas bronchitis  Levaquin per PCCM for 7 day course - should also cover for post-obstructive PNA - will need 4 additional days post d/c   Tobacco abuse  Counseled on the absolute need to never smoke again   Anxiety  Well controlled at this time - cont usual klonopin dose   Elevated BP Pt has no prior hx of HTN per his report - BP currently well controlled w/ Norvasc alone   Steroid induced hyperglycemia A1c 5.7 i.e. not c/w DM     Medication List    TAKE these medications        amLODipine 10 MG tablet  Commonly known as:  NORVASC  Take 1 tablet (10 mg total) by mouth daily.     BIO-FLAX 1000 MG Caps  Take 1,000 mg by mouth 2 (two) times daily.     clonazePAM 1 MG tablet  Commonly known as:  KLONOPIN  Take 1 mg by mouth 2 (two) times daily.     dextromethorphan-guaiFENesin 30-600 MG per 12 hr tablet  Commonly known as:  MUCINEX DM  Take 1 tablet by mouth every 12 (twelve) hours.     HYDROcodone-acetaminophen 5-325 MG per tablet  Commonly known as:  NORCO/VICODIN  Take 1-2 tablets by mouth every 4 (four) hours as needed for moderate pain.     ibuprofen 200 MG tablet  Commonly known as:  ADVIL,MOTRIN  Take 400 mg by mouth every 6 (six) hours as needed for mild pain or moderate pain.     ipratropium-albuterol 0.5-2.5 (3) MG/3ML Soln  Commonly known as:  DUONEB  Take 3 mLs by nebulization 4 (four) times daily as needed (Wheezing, shortness of breath).     levofloxacin 750 MG tablet  Commonly known as:   LEVAQUIN  Take 1 tablet (750 mg total) by mouth daily.     loratadine 10 MG tablet  Commonly known as:  CLARITIN  Take 10 mg by mouth every 12 (twelve) hours.     PROVENTIL HFA 108 (90 BASE) MCG/ACT inhaler  Generic drug:  albuterol  Inhale 2 puffs into the lungs every 6 (six) hours as needed.     SAW PALMETTO PO  Take 2 tablets by mouth 3 (three) times daily.     ST JOHNS WORT PO  Take 1 tablet by mouth  3 (three) times daily.     SYMBICORT 160-4.5 MCG/ACT inhaler  Generic drug:  budesonide-formoterol  Inhale 2 puffs into the lungs 2 (two) times daily.       Day of Discharge BP 123/81 mmHg  Pulse 95  Temp(Src) 98.3 F (36.8 C) (Oral)  Resp 22  Ht 5\' 11"  (1.803 m)  Wt 79.2 kg (174 lb 9.7 oz)  BMI 24.36 kg/m2  SpO2 91%  Physical Exam: General: No acute respiratory distress Lungs: Clear to auscultation bilaterally without wheezes or crackles Cardiovascular: Regular rate and rhythm without murmur gallop or rub normal S1 and S2 Abdomen: Nontender, nondistended, soft, bowel sounds positive, no rebound, no ascites, no appreciable mass Extremities: No significant cyanosis, clubbing, or edema bilateral lower extremities  Basic Metabolic Panel:  Recent Labs Lab 08/03/14 1359 08/04/14 0545 08/05/14 0325 08/08/14 0401  NA 140 137 140 139  K 4.7 4.0 4.8 4.3  CL 104 101 104 101  CO2 32 29 26 33*  GLUCOSE 89 192* 143* 96  BUN 14 17 18  25*  CREATININE 0.94 0.79 0.74 0.87  CALCIUM 9.0 8.9 8.8 8.6    Liver Function Tests:  Recent Labs Lab 08/05/14 0325 08/08/14 0401  AST 15 29  ALT 9 66*  ALKPHOS 68 55  BILITOT 0.7 0.7  PROT 6.4 6.2  ALBUMIN 3.5 3.2*   CBC:  Recent Labs Lab 08/03/14 1359 08/04/14 0545 08/08/14 0401  WBC 7.6 7.0 11.1*  NEUTROABS 4.7  --   --   HGB 14.7 15.3 14.1  HCT 43.7 44.9 43.7  MCV 88.1 87.2 91.8  PLT 195 220 220    Recent Results (from the past 240 hour(s))  MRSA PCR Screening     Status: None   Collection Time: 08/03/14  12:32 PM  Result Value Ref Range Status   MRSA by PCR NEGATIVE NEGATIVE Final    Comment:        The GeneXpert MRSA Assay (FDA approved for NASAL specimens only), is one component of a comprehensive MRSA colonization surveillance program. It is not intended to diagnose MRSA infection nor to guide or monitor treatment for MRSA infections.   Culture, blood (routine x 2)     Status: None (Preliminary result)   Collection Time: 08/05/14  8:01 PM  Result Value Ref Range Status   Specimen Description BLOOD LEFT HAND  Final   Special Requests BOTTLES DRAWN AEROBIC AND ANAEROBIC 10CC  Final   Culture   Final           BLOOD CULTURE RECEIVED NO GROWTH TO DATE CULTURE WILL BE HELD FOR 5 DAYS BEFORE ISSUING A FINAL NEGATIVE REPORT Note: Culture results may be compromised due to an excessive volume of blood received in culture bottles. Performed at [facility]    Report Status PENDING  Incomplete  Culture, blood (routine x 2)     Status: None (Preliminary result)   Collection Time: 08/05/14  8:07 PM  Result Value Ref Range Status   Specimen Description BLOOD RIGHT ARM  Final   Special Requests BOTTLES DRAWN AEROBIC AND ANAEROBIC 10CC  Final   Culture   Final           BLOOD CULTURE RECEIVED NO GROWTH TO DATE CULTURE WILL BE HELD FOR 5 DAYS BEFORE ISSUING A FINAL NEGATIVE REPORT Performed at [facility]    Report Status PENDING  Incomplete  Culture, expectorated sputum-assessment     Status: None   Collection Time: 08/05/14  8:51 PM  Result Value Ref Range Status   Specimen Description SPUTUM  Final   Special Requests NONE  Final   Sputum evaluation   Final    THIS SPECIMEN IS ACCEPTABLE. RESPIRATORY CULTURE REPORT TO FOLLOW.   Report Status 08/05/2014 FINAL  Final  Culture, respiratory (NON-Expectorated)     Status: None   Collection Time: 08/05/14  8:51 PM  Result Value Ref Range Status   Specimen Description SPUTUM  Final   Special Requests NONE  Final   Gram  Stain   Final    MODERATE WBC PRESENT, PREDOMINANTLY PMN FEW SQUAMOUS EPITHELIAL CELLS PRESENT FEW GRAM POSITIVE COCCI IN PAIRS IN CHAINS FEW GRAM NEGATIVE RODS Performed at [facility]    Culture   Final    MODERATE PSEUDOMONAS AERUGINOSA Performed at [facility]    Report Status 08/08/2014 FINAL  Final   Organism ID, Bacteria PSEUDOMONAS AERUGINOSA  Final      Susceptibility   Pseudomonas aeruginosa - MIC*    CEFEPIME 2 SENSITIVE Sensitive     CEFTAZIDIME 4 SENSITIVE Sensitive     CIPROFLOXACIN <=0.25 SENSITIVE Sensitive     GENTAMICIN <=1 SENSITIVE Sensitive     IMIPENEM 2 SENSITIVE Sensitive     PIP/TAZO 8 SENSITIVE Sensitive     TOBRAMYCIN <=1 SENSITIVE Sensitive     * MODERATE PSEUDOMONAS AERUGINOSA      Time spent in discharge (includes decision making & examination of pt): >35 minutes  08/09/2014, 1:40 PM   Cherene Altes, MD [facility] Office  [phone] Pager [phone]  On-Call/Text Page:      [email or web address]      password [facility]

## 2014-08-09 NOTE — Progress Notes (Signed)
Pt discharged home with wife.  Pt was alert and oriented without questions or concerns with discharge teaching.  Pt was transported home on 2l oxygen provided by [facility] home [facility] and seen by in house case worker regarding home health arrangements.  Pt belongings carried with pt.

## 2014-08-09 NOTE — Discharge Instructions (Signed)
Chronic Obstructive Pulmonary Disease °Chronic obstructive pulmonary disease (COPD) is a common lung condition in which airflow from the lungs is limited. COPD is a general term that can be used to describe many different lung problems that limit airflow, including both chronic bronchitis and emphysema.  If you have COPD, your lung function will probably never return to normal, but there are measures you can take to improve lung function and make yourself feel better.  °CAUSES  °· Smoking (common).   °· Exposure to secondhand smoke.   °· Genetic problems. °· Chronic inflammatory lung diseases or recurrent infections. °SYMPTOMS  °· Shortness of breath, especially with physical activity.   °· Deep, persistent (chronic) cough with a large amount of thick mucus.   °· Wheezing.   °· Rapid breaths (tachypnea).   °· Gray or bluish discoloration (cyanosis) of the skin, especially in fingers, toes, or lips.   °· Fatigue.   °· Weight loss.   °· Frequent infections or episodes when breathing symptoms become much worse (exacerbations).   °· Chest tightness. °DIAGNOSIS  °Your health care provider will take a medical history and perform a physical examination to make the initial diagnosis.  Additional tests for COPD may include:  °· Lung (pulmonary) function tests. °· Chest X-ray. °· CT scan. °· Blood tests. °TREATMENT  °Treatment available to help you feel better when you have COPD includes:  °· Inhaler and nebulizer medicines. These help manage the symptoms of COPD and make your breathing more comfortable. °· Supplemental oxygen. Supplemental oxygen is only helpful if you have a low oxygen level in your blood.   °· Exercise and physical activity. These are beneficial for nearly all people with COPD. Some people may also benefit from a pulmonary rehabilitation program. °HOME CARE INSTRUCTIONS  °· Take all medicines (inhaled or pills) as directed by your health care provider. °· Avoid over-the-counter medicines or cough syrups  that dry up your airway (such as antihistamines) and slow down the elimination of secretions unless instructed otherwise by your health care provider.   °· If you are a smoker, the most important thing that you can do is stop smoking. Continuing to smoke will cause further lung damage and breathing trouble. Ask your health care provider for help with quitting smoking. He or she can direct you to community resources or hospitals that provide support. °· Avoid exposure to irritants such as smoke, chemicals, and fumes that aggravate your breathing. °· Use oxygen therapy and pulmonary rehabilitation if directed by your health care provider. If you require home oxygen therapy, ask your health care provider whether you should purchase a pulse oximeter to measure your oxygen level at home.   °· Avoid contact with individuals who have a contagious illness. °· Avoid extreme temperature and humidity changes. °· Eat healthy foods. Eating smaller, more frequent meals and resting before meals may help you maintain your strength. °· Stay active, but balance activity with periods of rest. Exercise and physical activity will help you maintain your ability to do things you want to do. °· Preventing infection and hospitalization is very important when you have COPD. Make sure to receive all the vaccines your health care provider recommends, especially the pneumococcal and influenza vaccines. Ask your health care provider whether you need a pneumonia vaccine. °· Learn and use relaxation techniques to manage stress. °· Learn and use controlled breathing techniques as directed by your health care provider. Controlled breathing techniques include:   °· Pursed lip breathing. Start by breathing in (inhaling) through your nose for 1 second. Then, purse your lips as if you were   going to whistle and breathe out (exhale) through the pursed lips for 2 seconds.   Diaphragmatic breathing. Start by putting one hand on your abdomen just above  your waist. Inhale slowly through your nose. The hand on your abdomen should move out. Then purse your lips and exhale slowly. You should be able to feel the hand on your abdomen moving in as you exhale.   Learn and use controlled coughing to clear mucus from your lungs. Controlled coughing is a series of short, progressive coughs. The steps of controlled coughing are:   Lean your head slightly forward.   Breathe in deeply using diaphragmatic breathing.   Try to hold your breath for 3 seconds.   Keep your mouth slightly open while coughing twice.   Spit any mucus out into a tissue.   Rest and repeat the steps once or twice as needed. SEEK MEDICAL CARE IF:   You are coughing up more mucus than usual.   There is a change in the color or thickness of your mucus.   Your breathing is more labored than usual.   Your breathing is faster than usual.  SEEK IMMEDIATE MEDICAL CARE IF:   You have shortness of breath while you are resting.   You have shortness of breath that prevents you from:  Being able to talk.   Performing your usual physical activities.   You have chest pain lasting longer than 5 minutes.   Your skin color is more cyanotic than usual.  You measure low oxygen saturations for longer than 5 minutes with a pulse oximeter. MAKE SURE YOU:   Understand these instructions.  Will watch your condition.  Will get help right away if you are not doing well or get worse. Document Released: 03/02/2005 Document Revised: 10/07/2013 Document Reviewed: 01/17/2013 [facility] Patient Information 2015 [place], [place]. This information is not intended to replace advice given to you by your health care provider. Make sure you discuss any questions you have with your health care provider.  Oxygen Use at Home Oxygen can be prescribed for home use. The prescription will show the flow rate. This is how much oxygen is to be used per minute. This will be listed in liters per  minute (LPM or L/M). A liter is a metric measurement of volume. You will use oxygen therapy as directed. It can be used while exercising, sleeping, or at rest. You may need oxygen continuously. Your health care provider may order a blood oxygen test (arterial blood gas or pulse oximetry test) that will show what your oxygen level is. Your health care provider will use these measurements to learn about your needs and follow your progress. Home oxygen therapy is commonly used on patients with various lung (pulmonary) related conditions. Some of these conditions include:  Asthma.  Lung cancer.  Pneumonia.  Emphysema.  Chronic bronchitis.  Cystic fibrosis.  Other lung diseases.  Pulmonary fibrosis.  Occupational lung disease.  Heart failure.  Chronic obstructive pulmonary disease (COPD). 3 COMMON WAYS OF PROVIDING OXYGEN THERAPY  Gas: The gas form of oxygen is put into variously sized cylinders or tanks. The cylinders or oxygen tanks contain compressed oxygen. The cylinder is equipped with a regulator that controls the flow rate. Because the flow of oxygen out of the cylinder is constant, an oxygen conserving device may be attached to the system to avoid waste. This device releases the gas only when you inhale and cuts it off when you exhale. Oxygen can be provided in a  small cylinder that can be carried with you. Large tanks are heavy and are only for stationary use. After use, empty tanks must be exchanged for full tanks.  Liquid: The liquid form of oxygen is put into a container similar to a thermos. When released, the liquid converts to a gas and you breathe it in just like the compressed gas. This storage method takes up less space than the compressed gas cylinder, and you can transfer the liquid to a small, portable vessel at home. Liquid oxygen is more expensive than the compressed gas, and the vessel vents when not in use. An oxygen conserving device may be built into the vessel to  conserve the oxygen. Liquid oxygen is very cold, around 297 below zero.  Oxygen concentrator: This medical device filters oxygen from room air and gives almost 100% oxygen to the patient. Oxygen concentrators are powered by electricity. Benefits of this system are:  It does not need to be resupplied.  It is not as costly as liquid oxygen.  Extra tubing permits the user to move around easier. There are several types of small, portable oxygen systems available which can help you remain active and mobile. You must have a cylinder of oxygen as a backup in the event of a power failure. Advise your electric power company that you are on oxygen therapy in order to get priority service when there is a power failure. OXYGEN DELIVERY DEVICES There are 3 common ways to deliver oxygen to your body.  Nasal cannula. This is a 2-pronged device inserted in the nostrils that is connected to tubing carrying the oxygen. The tubing can rest on the ears or be attached to the frame of eyeglasses.  Mask. People who need a high flow of oxygen generally use a mask.  Transtracheal catheter. Transtracheal oxygen therapy requires the insertion of a small, flexible tube (catheter) in the windpipe (trachea). This catheter is held in place by a necklace. Since transtracheal oxygen bypasses the mouth, nose, and throat, a humidifier is absolutely required at flow rates of 1 LPM or greater. OXYGEN USE SAFETY TIPS  Never smoke while using oxygen. Oxygen does not burn or explode, but flammable materials will burn faster in the presence of oxygen.  Keep a Data processing manager close by. Let your fire department know that you have oxygen in your home.  Warn visitors not to smoke near you when you are using oxygen. Put up "no smoking" signs in your home where you most often use the oxygen.  When you go to a restaurant with your portable oxygen source, ask to be seated in the nonsmoking section.  Stay at least 5 feet away from gas  stoves, candles, lighted fireplaces, or other heat sources.  Do not use materials that burn easily (flammable) while using your oxygen.  If you use an oxygen cylinder, make sure it is secured to some fixed object or in a stand. If you use liquid oxygen, make sure the vessel is kept upright to keep the oxygen from pouring out. Liquid oxygen is so cold it can hurt your skin.  If you use an oxygen concentrator, call your electric company so you will be given priority service if your power goes out. Avoid using extension cords, if possible.  Regularly test your smoke detectors at home to make sure they work. If you receive care in your home from a nurse or other health care provider, he or she may also check to make sure your smoke detectors work.  GUIDELINES FOR CLEANING YOUR EQUIPMENT  Wash the nasal prongs with a liquid soap. Thoroughly rinse them once or twice a week.  Replace the prongs every 2 to 4 weeks. If you have an infection (cold, pneumonia) change them when you are well.  Your health care provider will give you instructions on how to clean your transtracheal catheter.  The humidifier bottle should be washed with soap and warm water and rinsed thoroughly between each refill. Air-dry the bottle before filling it with sterile or distilled water. The bottle and its top should be disinfected after they are cleaned.  If you use an oxygen concentrator, unplug the unit. Then wipe down the cabinet with a damp cloth and dry it daily. The air filter should be cleaned at least twice a week.  Follow your home medical equipment and service company's directions for cleaning the compressor filter. HOME CARE INSTRUCTIONS   Do not change the flow of oxygen unless directed by your health care provider.  Do not use alcohol or other sedating drugs unless instructed. They slow your breathing rate.  Do not use materials that burn easily (flammable) while using your oxygen.  Always keep a spare tank of  oxygen. Plan ahead for holidays when you may not be able to get a prescription filled.  Use water-based lubricants on your lips or nostrils. Do not use an oil-based product like petroleum jelly.  To prevent your cheeks or the skin behind your ears from becoming irritated, tuck some gauze under the tubing.  If you have persistent redness under your nose, call your health care provider.  When you no longer need oxygen, your doctor will have the oxygen discontinued. Oxygen is not addicting or habit forming.  Use the oxygen as instructed. Too much oxygen can be harmful and too little will not give you the benefit you need.  Shortness of breath is not always from a lack of oxygen. If your oxygen level is not the cause of your shortness of breath, taking oxygen will not help. SEEK MEDICAL CARE IF:   You have frequent headaches.  You have shortness of breath or a lasting cough.  You have anxiety.  You are confused.  You are drowsy or sleepy all the time.  You develop an illness which aggravates your breathing.  You cannot exercise.  You are restless.  You have blue lips or fingernails.  You have difficult or irregular breathing and it is getting worse.  You have a fever. Document Released: 08/13/2003 Document Revised: 10/07/2013 Document Reviewed: 01/02/2013 [facility] Patient Information 2015 [place], [place]. This information is not intended to replace advice given to you by your health care provider. Make sure you discuss any questions you have with your health care provider.

## 2014-08-09 NOTE — Progress Notes (Addendum)
      [address]       [place] [address]             [phone]      3 Days Post-Op Procedure(s) (LRB): VIDEO BRONCHOSCOPY WITHOUT FLUORO (Bilateral)   Subjective:  No new complaints  Objective: Vital signs in last 24 hours: Temp:  [97.4 F (36.3 C)-97.9 F (36.6 C)] 97.8 F (36.6 C) (03/05 0738) Pulse Rate:  [77-101] 77 (03/05 0738) Cardiac Rhythm:  [-] Normal sinus rhythm (03/04 2111) Resp:  [17-27] 20 (03/05 0738) BP: (101-144)/(65-97) 139/97 mmHg (03/05 0738) SpO2:  [91 %-97 %] 93 % (03/05 0906)  Intake/Output from previous day: 03/04 0701 - 03/05 0700 In: 6 [I.V.:6] Out: 1450 [Urine:1450]  General appearance: alert, cooperative and no distress Heart: regular rate and rhythm Lungs: rhonchi left and wheezes left Wound: clean and dry  Lab Results:  Recent Labs  08/08/14 0401  WBC 11.1*  HGB 14.1  HCT 43.7  PLT 220   BMET:  Recent Labs  08/08/14 0401  NA 139  K 4.3  CL 101  CO2 33*  GLUCOSE 96  BUN 25*  CREATININE 0.87  CALCIUM 8.6    PT/INR: No results for input(s): LABPROT, INR in the last 72 hours. ABG    Component Value Date/Time   PHART 7.429 08/07/2014 1647   HCO3 30.7* 08/07/2014 1647   TCO2 32.2 08/07/2014 1647   O2SAT 94.6 08/07/2014 1647   CBG (last 3)  No results for input(s): GLUCAP in the last 72 hours.  Assessment/Plan: S/P Procedure(s) (LRB): VIDEO BRONCHOSCOPY WITHOUT FLUORO (Bilateral)  1. CXR stable post chest tube removal.  Okay to d/c from our standpoint, follow up appointments have been made and are placed in chart   LOS: 6 days    BARRETT, ERIN 08/09/2014  Patient reviewed, agree with above assessment and plan

## 2014-08-10 NOTE — Care Management Note (Signed)
    Page 1 of 1   08/10/2014     8:46:37 AM CARE MANAGEMENT NOTE 08/10/2014  Patient:  Jeffrey Hines, Jeffrey Hines   Account Number:  [id number]  Date Initiated:  08/09/2014  Documentation initiated by:  [facility]  Subjective/Objective Assessment:   adm: Chest pain and worsening dyspnea.     Action/Plan:   discharge planning   Anticipated DC Date:  08/09/2014   Anticipated DC Plan:  [place]  CM consult      Choice offered to / List presented to:     DME arranged  [facility]      DME agency  [facility].        Status of service:  Completed, signed off Medicare Important Message given?   (If response is "NO", the following Medicare IM given date fields will be blank) Date Medicare IM given:   Medicare IM given by:   Date Additional Medicare IM given:   Additional Medicare IM given by:    Discharge Disposition:  HOME/SELF CARE  Per UR Regulation:    If discussed at Long Length of Stay Meetings, dates discussed:    Comments:  08/09/14 14:00 Cm met with pt and wife in room to discuss home needs.  CM called [facility] DME rep, Jeneen Rinks to please deliver home O2 tank to room prior to discharge.  No other needs were communicated.  Mariane Masters, [facility], [place].

## 2014-08-11 ENCOUNTER — Encounter: Payer: Self-pay | Admitting: *Deleted

## 2014-08-12 ENCOUNTER — Other Ambulatory Visit: Payer: Self-pay

## 2014-08-12 DIAGNOSIS — J939 Pneumothorax, unspecified: Secondary | ICD-10-CM

## 2014-08-12 LAB — CULTURE, BLOOD (ROUTINE X 2)
CULTURE: NO GROWTH
Culture: NO GROWTH

## 2014-08-13 ENCOUNTER — Ambulatory Visit (HOSPITAL_COMMUNITY)
Admission: RE | Admit: 2014-08-13 | Discharge: 2014-08-13 | Disposition: A | Discharge status: Home / Self Care | Payer: Medicare Other | Source: Ambulatory Visit | Attending: Thoracic Surgery (Cardiothoracic Vascular Surgery) | Admitting: Thoracic Surgery (Cardiothoracic Vascular Surgery)

## 2014-08-13 ENCOUNTER — Encounter (HOSPITAL_COMMUNITY)
Admit: 2014-08-13 | Discharge: 2014-08-13 | Disposition: A | Discharge status: Home / Self Care | Payer: Medicare Other | Source: Ambulatory Visit | Attending: Thoracic Surgery (Cardiothoracic Vascular Surgery) | Admitting: Thoracic Surgery (Cardiothoracic Vascular Surgery)

## 2014-08-13 ENCOUNTER — Telehealth: Payer: Self-pay | Admitting: *Deleted

## 2014-08-13 DIAGNOSIS — T797XXS Traumatic subcutaneous emphysema, sequela: Secondary | ICD-10-CM | POA: Insufficient documentation

## 2014-08-13 DIAGNOSIS — Y838 Other surgical procedures as the cause of abnormal reaction of the patient, or of later complication, without mention of misadventure at the time of the procedure: Secondary | ICD-10-CM | POA: Diagnosis not present

## 2014-08-13 DIAGNOSIS — C3490 Malignant neoplasm of unspecified part of unspecified bronchus or lung: Secondary | ICD-10-CM | POA: Diagnosis not present

## 2014-08-13 DIAGNOSIS — J449 Chronic obstructive pulmonary disease, unspecified: Secondary | ICD-10-CM | POA: Insufficient documentation

## 2014-08-13 DIAGNOSIS — C3492 Malignant neoplasm of unspecified part of left bronchus or lung: Secondary | ICD-10-CM | POA: Insufficient documentation

## 2014-08-13 DIAGNOSIS — Z87891 Personal history of nicotine dependence: Secondary | ICD-10-CM | POA: Diagnosis not present

## 2014-08-13 DIAGNOSIS — J9383 Other pneumothorax: Secondary | ICD-10-CM | POA: Insufficient documentation

## 2014-08-13 DIAGNOSIS — I10 Essential (primary) hypertension: Secondary | ICD-10-CM | POA: Diagnosis not present

## 2014-08-13 DIAGNOSIS — J939 Pneumothorax, unspecified: Secondary | ICD-10-CM | POA: Diagnosis not present

## 2014-08-13 DIAGNOSIS — C3412 Malignant neoplasm of upper lobe, left bronchus or lung: Secondary | ICD-10-CM | POA: Diagnosis not present

## 2014-08-13 DIAGNOSIS — R918 Other nonspecific abnormal finding of lung field: Secondary | ICD-10-CM

## 2014-08-13 DIAGNOSIS — J439 Emphysema, unspecified: Secondary | ICD-10-CM | POA: Diagnosis not present

## 2014-08-13 LAB — GLUCOSE, CAPILLARY: Glucose-Capillary: 75 mg/dL (ref 70–99)

## 2014-08-13 MED ORDER — FLUDEOXYGLUCOSE F - 18 (FDG) INJECTION
8.7400 | Freq: Once | INTRAVENOUS | Status: AC | PRN
  Administered 2014-08-13: 8.74 via INTRAVENOUS

## 2014-08-13 NOTE — Telephone Encounter (Signed)
Received request from Jeffrey Hines to call pt w/ a friendly reminder about his appt.  Called and left a message reminding pt of date and check in time for 3/10 appt w/ directions and instructions and stated to call the office with any questions.

## 2014-08-14 ENCOUNTER — Ambulatory Visit
Admission: RE | Admit: 2014-08-14 | Discharge: 2014-08-14 | Disposition: A | Discharge status: Home / Self Care | Payer: Managed Care, Other (non HMO) | Source: Ambulatory Visit | Attending: Radiation Oncology | Admitting: Radiation Oncology

## 2014-08-14 ENCOUNTER — Other Ambulatory Visit: Payer: Self-pay | Admitting: *Deleted

## 2014-08-14 ENCOUNTER — Telehealth: Payer: Self-pay | Admitting: *Deleted

## 2014-08-14 ENCOUNTER — Ambulatory Visit: Payer: Medicare Other | Attending: Internal Medicine | Admitting: Physical Therapy

## 2014-08-14 ENCOUNTER — Encounter: Payer: Self-pay | Admitting: *Deleted

## 2014-08-14 ENCOUNTER — Ambulatory Visit (HOSPITAL_BASED_OUTPATIENT_CLINIC_OR_DEPARTMENT_OTHER): Payer: Medicare Other | Admitting: Internal Medicine

## 2014-08-14 ENCOUNTER — Encounter: Payer: Self-pay | Admitting: Internal Medicine

## 2014-08-14 VITALS — BP 134/76 | HR 92 | Temp 97.8°F | Resp 21 | Ht 71.0 in | Wt 182.5 lb

## 2014-08-14 DIAGNOSIS — R293 Abnormal posture: Secondary | ICD-10-CM

## 2014-08-14 DIAGNOSIS — C3492 Malignant neoplasm of unspecified part of left bronchus or lung: Secondary | ICD-10-CM

## 2014-08-14 DIAGNOSIS — R5381 Other malaise: Secondary | ICD-10-CM | POA: Insufficient documentation

## 2014-08-14 DIAGNOSIS — R0602 Shortness of breath: Secondary | ICD-10-CM | POA: Insufficient documentation

## 2014-08-14 DIAGNOSIS — C3412 Malignant neoplasm of upper lobe, left bronchus or lung: Secondary | ICD-10-CM

## 2014-08-14 NOTE — Telephone Encounter (Signed)
Called and left a voicemail as a reminder for Jeffrey Hines to arrive to the [facility] at 0730 for his planning setup appointment in Radiation Oncology.

## 2014-08-14 NOTE — Progress Notes (Signed)
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 structures are stable. Heart size stable . Persistent left upper lobe atelectasis. Right lung clear. Heart size stable. No pleural effusion or pneumothorax.  IMPRESSION: 1. Left chest tube in stable position.  No pneumothorax. 2. Persistent left upper lobe atelectasis.  No interim change.   Electronically Signed   By: Marcello Moores  Register   On: 08/06/2014 07:08   Dg Chest Port 1 View  08/05/2014   CLINICAL DATA:  History of pneumothorax  EXAM: PORTABLE CHEST - 1 VIEW  COMPARISON:  Portable chest x-ray of August 04, 2014  FINDINGS: The right lung is hyperinflated and clear. Mild shift of mediastinum toward the left is stable. The left lung demonstrates new increased density superiorly consistent with upper lobe collapse. There is no pneumothorax. The left-sided chest tube tip projects over the posterior aspect of the third rib and is stable. The cardiac silhouette and pulmonary vascularity are unremarkable. Multiple posterior rib deformities on the left are noted.  IMPRESSION: Progressive left upper lobe collapse. No pneumothorax is evident. The chest tube is unchanged in position.   Electronically Signed   By: David  Martinique   On: 08/05/2014 07:24   Dg Chest Port 1 View  08/04/2014   CLINICAL DATA:  Reassess pneumothorax  EXAM: PORTABLE CHEST - 1 VIEW   COMPARISON:  Portable chest x-ray of August 03, 2014  FINDINGS: There is persistent volume  loss on the left with shift of the mediastinum from right to left. No pneumothorax is visible. Parenchymal consolidation in the left upper lobe is less conspicuous today. The left-sided chest tube tip remains in the pulmonary apex. The right lung is hyperinflated and clear. The heart is normal in size. The pulmonary vascularity is not engorged.  IMPRESSION: No residual left-sided pneumothorax is demonstrated. There is no significant pleural effusion. Patchy upper lobe infiltrate has improved as well. There is compensatory hyperinflation of the right lung.   Electronically Signed   By: David  Martinique   On: 08/04/2014 08:38   Dg Chest Portable 1 View  08/03/2014   CLINICAL DATA:  Chest tube placement on left.  Pneumothorax.  EXAM: PORTABLE CHEST - 1 VIEW  COMPARISON:  Chest CT 08/03/2014  FINDINGS: Interval placement of left chest tube. Left upper airspace opacity noted, presumably consolidation. Right lung is clear. No visible effusions. No visible residual pneumothorax.  IMPRESSION: Re-expansion of the left lung following chest tube placement. Left upper lobe airspace consolidation.   Electronically Signed   By: Rolm Baptise M.D.   On: 08/03/2014 17:00    ASSESSMENT: This is a very pleasant 69 years old white male recently diagnosed with questionable unresectable stage IIB (T3, N0, M0) non-small cell lung cancer, squamous cell carcinoma diagnosed in March 2016 presented with large destructive left upper lobe lung mass with collapse of the left upper lobe. The patient also has a history of left pneumothorax is status post left chest tube placement as well as severe emphysema.   PLAN: I had a lengthy discussion with the patient and his wife today about his current disease stage, prognosis and treatment options. This patient is unlikely to be a surgical candidate for resection. I recommended for him a course of  concurrent chemoradiation with weekly carboplatin for AUC of 2 and paclitaxel 45 MG/M2. I discussed with the patient adverse effect of the chemotherapy including but not limited to alopecia, myelosuppression, nausea and vomiting, peripheral neuropathy, liver or renal dysfunction. The patient will be seen later today by Dr. Sondra Come for evaluation and discussion of the radiotherapy option. He is expected to start the first cycle of this treatment on 08/25/2014. I will arrange for the patient to have a chemotherapy education class before starting the first dose of his chemotherapy. I will call his pharmacy with prescription for Compazine 10 mg by mouth every 6 hours as needed for nausea. The patient would come back for follow-up visit in 3 weeks for reevaluation and management of any adverse effect of his chemotherapy. The patient was seen during the multidisciplinary thoracic oncology clinic today by medical oncology, radiation oncology, thoracic navigator, social worker and physical therapist. He was advised to call immediately if he has any concerning symptoms in the interval. The patient voices understanding of current disease status and treatment options and is in agreement with the current care plan.  All questions were answered. The patient knows to call the clinic with any problems, questions or concerns. We can certainly see the patient much sooner if necessary.  Thank you so much for allowing me to participate in the care of Jeffrey Hines. I will continue to follow up the patient with you and assist in his care.  I spent 55 minutes counseling the patient face to face. The total time spent in the appointment was 80 minutes.  Disclaimer: This note was dictated with voice recognition software. Similar sounding words can inadvertently be transcribed and may not be  corrected upon review.   Chandi Nicklin K. August 14, 2014, 4:39 PM
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
## 2014-08-14 NOTE — Progress Notes (Signed)
[place] Clinical Social Work  Clinical Social Work met with patient/family and Futures trader at [facility] appointment to offer support and assess for psychosocial needs.  Medical oncologist reviewed patient's diagnosis and recommended treatment plan with patient/family.  Patient was accompanied by  Clinical Social Work was referred by distress screening protocol.  The patient scored a 10 on the Psychosocial Distress Thermometer which indicates severe distress. Jeffrey Hines shared he has a history of generalized anxiety disorder and is being managed by his PCP through the [place] (currently prescribed klonopin).  CSW encouraged patient to utilize emotional counseling support through treatment.  Patient was unsure if he was interested, but patient's spouse was interested and plans to follow up with CSW at a later time.  Jeffrey Hines is retired, but works on [employer] when he can.  ONCBCN DISTRESS SCREENING 08/14/2014  Screening Type Initial Screening  Distress experienced in past week (1-10) 10  Emotional problem type Nervousness/Anxiety;Adjusting to illness;Adjusting to appearance changes  Referral to clinical social work Yes   Clinical Social Work briefly discussed Clinical Social Work role and [employer] support programs/services.  Clinical Social Work encouraged patient to call with any additional questions or concerns.   Jeffrey Hines, MSW, LCSW, OSW-C Clinical Social Worker [facility] [phone]

## 2014-08-14 NOTE — Progress Notes (Signed)
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 No pleural effusion or pneumothorax.  IMPRESSION: 1. Left chest tube in stable position.  No pneumothorax. 2. Persistent left upper lobe atelectasis.  No interim change.   Electronically Signed   By: Marcello Moores  Register   On: 08/06/2014 07:08   Dg Chest Port 1 View  08/05/2014   CLINICAL DATA:  History of pneumothorax  EXAM: PORTABLE CHEST - 1 VIEW  COMPARISON:  Portable chest x-ray of August 04, 2014  FINDINGS: The right lung is hyperinflated and clear. Mild shift of mediastinum toward the left is stable. The left lung demonstrates new increased density superiorly consistent with upper lobe collapse. There is no pneumothorax. The left-sided chest tube tip projects over the posterior aspect of the third rib and is stable. The cardiac silhouette and pulmonary vascularity are unremarkable. Multiple posterior rib deformities on the left are noted.  IMPRESSION: Progressive left upper  lobe collapse. No pneumothorax is evident. The chest tube is unchanged in position.   Electronically Signed   By: David  Martinique   On: 08/05/2014 07:24   Dg Chest Port 1 View  08/04/2014   CLINICAL DATA:  Reassess pneumothorax  EXAM: PORTABLE CHEST - 1 VIEW  COMPARISON:  Portable chest x-ray of August 03, 2014  FINDINGS: There is persistent volume loss on the left with shift of the  mediastinum from right to left. No pneumothorax is visible. Parenchymal consolidation in the left upper lobe is less conspicuous today. The left-sided chest tube tip remains in the pulmonary apex. The right lung is hyperinflated and clear. The heart is normal in size. The pulmonary vascularity is not engorged.  IMPRESSION: No residual left-sided pneumothorax is demonstrated. There is no significant pleural effusion. Patchy upper lobe infiltrate has improved as well. There is compensatory hyperinflation of the right lung.   Electronically Signed   By: David  Martinique   On: 08/04/2014 08:38   Dg Chest Portable 1 View  08/03/2014   CLINICAL DATA:  Chest tube placement on left.  Pneumothorax.  EXAM: PORTABLE CHEST - 1 VIEW  COMPARISON:  Chest CT 08/03/2014  FINDINGS: Interval placement of left chest tube. Left upper airspace opacity noted, presumably consolidation. Right lung is clear. No visible effusions. No visible residual pneumothorax.  IMPRESSION: Re-expansion of the left lung following chest tube placement. Left upper lobe airspace consolidation.   Electronically Signed   By: Rolm Baptise M.D.   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 The patient will be a reasonable candidate for preoperative radiation along with radiosensitizing chemotherapy. Given the patient's lung status I'm unsure whether he would be a potential operative candidate but the patient is motivated to consider this treatment approach. He has stopped smoking and is doing his respiratory exercises as recommended. I discussed the treatment course side effects and potential toxicities of radiation therapy in this situation with the patient and his wife. He appears to understand and wishes to proceed with planned course of treatment.  PLAN: Simulation and planning March 11 with treatments to begin the week of March 21 along with radiosensitizing chemotherapy. The  patient will receive a preoperative dose of 45 gray and then be reevaluated with pulmonary function studies and chest CT scan for potential resectability.     ------------------------------------------------  Blair Promise, PhD, MD

## 2014-08-15 ENCOUNTER — Ambulatory Visit
Admission: RE | Admit: 2014-08-15 | Discharge: 2014-08-15 | Disposition: A | Discharge status: Home / Self Care | Payer: Medicare Other | Source: Ambulatory Visit | Attending: Radiation Oncology | Admitting: Radiation Oncology

## 2014-08-15 ENCOUNTER — Telehealth: Payer: Self-pay | Admitting: Internal Medicine

## 2014-08-15 VITALS — BP 124/75 | HR 88 | Temp 97.5°F | Resp 24 | Ht 71.0 in | Wt 182.7 lb

## 2014-08-15 DIAGNOSIS — Z8701 Personal history of pneumonia (recurrent): Secondary | ICD-10-CM | POA: Diagnosis not present

## 2014-08-15 DIAGNOSIS — C3492 Malignant neoplasm of unspecified part of left bronchus or lung: Secondary | ICD-10-CM

## 2014-08-15 DIAGNOSIS — Z510 Encounter for antineoplastic radiation therapy: Secondary | ICD-10-CM | POA: Diagnosis not present

## 2014-08-15 DIAGNOSIS — Z79899 Other long term (current) drug therapy: Secondary | ICD-10-CM | POA: Diagnosis not present

## 2014-08-15 DIAGNOSIS — C3412 Malignant neoplasm of upper lobe, left bronchus or lung: Secondary | ICD-10-CM

## 2014-08-15 DIAGNOSIS — Z79891 Long term (current) use of opiate analgesic: Secondary | ICD-10-CM | POA: Diagnosis not present

## 2014-08-15 DIAGNOSIS — R52 Pain, unspecified: Secondary | ICD-10-CM | POA: Insufficient documentation

## 2014-08-15 DIAGNOSIS — L598 Other specified disorders of the skin and subcutaneous tissue related to radiation: Secondary | ICD-10-CM | POA: Insufficient documentation

## 2014-08-15 MED ORDER — PROCHLORPERAZINE MALEATE 10 MG PO TABS: 10.0000 mg | ORAL_TABLET | Freq: Four times a day (QID) | ORAL | Status: DC | PRN

## 2014-08-15 NOTE — Telephone Encounter (Signed)
Pt stopped by for schedule after radonc visit. Pt given avs report and appts for march thru April. Per AJ ok to use her 11:15am slot on 3/28.

## 2014-08-15 NOTE — Progress Notes (Signed)
Jeffrey Hines here with his wife for nurse evaluation before CT SIM.  He reports pain at 2/10 in the center of his chest.  He also reports seeing bright red blood this morning when he cleared his throat.  He reports that this has happened before but the color was darker.  He denies a frequent cough.  He reports his shortness of breath has improved.  He is using 2 L of oxygen via nasal canula.

## 2014-08-15 NOTE — Progress Notes (Signed)
  Radiation Oncology         [phone] ________________________________  Name: Ranferi Clingan MRN: [id number]  Date: 08/15/2014  DOB: 01/24/1946  SIMULATION AND TREATMENT PLANNING NOTE    ICD-9-CM ICD-10-CM   1. 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  Identity was confirmed.  All relevant records and images related to the planned course of therapy were reviewed.  The patient freely provided informed written consent to proceed with treatment after reviewing the details related to the planned course of therapy. The consent form was witnessed and verified by the simulation staff.  Then, the patient was set-up in a stable reproducible  supine position for radiation therapy.  CT images were obtained.  Surface markings were placed.  The CT images were loaded into the planning software.  Then the target and avoidance structures were contoured.  Treatment planning then occurred.  The radiation prescription was entered and confirmed.  Then, I designed and supervised the construction of a total of 5 medically necessary complex treatment devices.  I have requested : 3D Simulation  I have requested a DVH of the following structures: GTV, PTV, heart, lungs, esophagus.  I have ordered:dose calc.  PLAN:  The patient will receive 45 Gy in 25 fractions   ________________________________  Special treatment procedure note  The patient will be receiving radiosensitizing chemotherapy throughout his course of chest radiation treatment. Given the increased potential for toxicities as well as the necessity for close monitoring of the patient and bloodwork, this constitutes a special treatment procedure -----------------------------------  Blair Promise, PhD, MD

## 2014-08-18 ENCOUNTER — Other Ambulatory Visit (HOSPITAL_COMMUNITY): Payer: Self-pay | Admitting: Dentistry

## 2014-08-18 ENCOUNTER — Encounter: Payer: Self-pay | Admitting: *Deleted

## 2014-08-18 NOTE — [facility] Oncology Navigator Note (Unsigned)
Dr. Enrique Sack called and left me a vm message.  I called him back.  He wanted to know about when patient would be stable for dental extractions.  I spoke with Dr. Julien Nordmann.  He stated patient is stable.  I relayed that to Dr. Orene Desanctis.

## 2014-08-19 ENCOUNTER — Ambulatory Visit (INDEPENDENT_AMBULATORY_CARE_PROVIDER_SITE_OTHER): Payer: Medicare Other | Admitting: Pulmonary Disease

## 2014-08-19 ENCOUNTER — Encounter: Payer: Self-pay | Admitting: Pulmonary Disease

## 2014-08-19 VITALS — BP 120/78 | HR 95 | Ht 71.0 in | Wt 185.0 lb

## 2014-08-19 DIAGNOSIS — Z79891 Long term (current) use of opiate analgesic: Secondary | ICD-10-CM | POA: Diagnosis not present

## 2014-08-19 DIAGNOSIS — J9621 Acute and chronic respiratory failure with hypoxia: Secondary | ICD-10-CM

## 2014-08-19 DIAGNOSIS — L598 Other specified disorders of the skin and subcutaneous tissue related to radiation: Secondary | ICD-10-CM | POA: Diagnosis not present

## 2014-08-19 DIAGNOSIS — Z8701 Personal history of pneumonia (recurrent): Secondary | ICD-10-CM | POA: Diagnosis not present

## 2014-08-19 DIAGNOSIS — C3492 Malignant neoplasm of unspecified part of left bronchus or lung: Secondary | ICD-10-CM

## 2014-08-19 DIAGNOSIS — J449 Chronic obstructive pulmonary disease, unspecified: Secondary | ICD-10-CM | POA: Diagnosis not present

## 2014-08-19 DIAGNOSIS — Z510 Encounter for antineoplastic radiation therapy: Secondary | ICD-10-CM | POA: Diagnosis not present

## 2014-08-19 DIAGNOSIS — J9611 Chronic respiratory failure with hypoxia: Secondary | ICD-10-CM | POA: Insufficient documentation

## 2014-08-19 DIAGNOSIS — C3412 Malignant neoplasm of upper lobe, left bronchus or lung: Secondary | ICD-10-CM | POA: Diagnosis not present

## 2014-08-19 DIAGNOSIS — C3490 Malignant neoplasm of unspecified part of unspecified bronchus or lung: Secondary | ICD-10-CM | POA: Diagnosis not present

## 2014-08-19 DIAGNOSIS — R52 Pain, unspecified: Secondary | ICD-10-CM | POA: Diagnosis not present

## 2014-08-19 NOTE — Assessment & Plan Note (Signed)
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 His symptoms have been well controlled on Symbicort and albuterol. I explained to him that he can use the albuterol nebulizer on an as-needed basis rather than routinely.  Plan: -Continue Symbicort twice a day -Use short acting bronchodilator on an as-needed basis

## 2014-08-19 NOTE — Assessment & Plan Note (Signed)
Continue 2 L of O2 continuously. Will prescribe portable oxygen concentrator.

## 2014-08-19 NOTE — Patient Instructions (Signed)
We will change your oxygen to a portable oxygen concentrator from [place] We will see you back in 2 months or sooner if needed

## 2014-08-19 NOTE — Assessment & Plan Note (Signed)
At this point is tumor does not seem amenable to resection based on its proximal location to the left upper lobe orifice. However, hopefully we will see improvement after chemotherapy and radiation therapy. I would recommend repeat PFTs and imaging after chemotherapy and his radiation therapy and potentially surgery could be reconsidered.  I do not feel that he would be a good candidate for lobectomy, but I suppose that could be further assessed with a VQ scan if it was felt lobectomy was in his best interest.  Plan: -Chemotherapy and radiation therapy per oncology and radiation oncology -Would recommend repeat PFT and chemotherapy after treatment in order to reconsider lobectomy.

## 2014-08-19 NOTE — Progress Notes (Signed)
Subjective:    Patient ID: Baby Stairs, male    DOB: 1945-08-19, 69 y.o.   MRN: [id number]  HPI  Chief Complaint  Patient presents with  . Hospitalization Follow-up    Breathing is doing well since leaving hospital    This is a very pleasant 69 year old male who was recently hospitalized in [place] for a lung mass, Pseudomonas pneumonia, and a pneumothorax. He was evaluated by thoracic surgery as well as my partners during his hospital stay. He was initially treated with a chest tube but then was found to have a left upper lobe mass. He underwent a bronchoscopy by my partner and biopsies confirmed a diagnosis of squamous cell carcinoma. The mass was noted to be in the orifice of the left upper lobe. Lung function testing performed during his hospital stay showed: March 2016 pulmonary function test> ratio 50%, FEV1 1.71 L (49% predicted, 18% change with bronchodilator), total lung capacity 6.73 L (93% predicted), DLCO 11.13 (33% predicted).  He was treated for pneumonia, and his chest tube was removed prior to discharge. He was seen by oncology during his visit and plans were made for radiation and chemotherapy as it was felt that his tumor would not be resectable to lobectomy based on its location.  Since going home he says his breathing has improved significantly and he is feeling better than he has in months. He continues to take Symbicort 2 puffs twice a day. He uses his albuterol nebulizer 4 times a day on a routine basis. He says his energy level has improved. He is using 2 L of oxygen continuously. He still has some dyspnea as well as a cough with occasional mucus production but his symptoms are overall very much improved.   Past Medical History  Diagnosis Date  . COPD (chronic obstructive pulmonary disease)   . Closed head injury 1998  . Multiple rib fractures 1998    left side  . Shoulder dislocation 1998    left  . COPD with chronic bronchitis   . Tobacco abuse   .  Hypertension   . Spontaneous pneumothorax 08/03/2014    Left side 1st time episode spontaneous pneumothorax associated with acute flare of COPD   . Hemoptysis 08/03/2014  . Cancer   . Post-obstruction pneumonia due to Pseudomonas aeruginosa 08/07/2014    Endobronchial mass causing LUL obstruction     Family History  Problem Relation Age of Onset  . Lung cancer Mother      History   Social History  . Marital Status: Married    Spouse Name: N/A  . Number of Children: 1  . Years of Education: N/A   Occupational History  . motorcycle repair    Social History Main Topics  . Smoking status: Former Smoker -- 0.50 packs/day for 50 years    Types: Cigarettes    Quit date: 08/03/2014  . Smokeless tobacco: Never Used  . 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  . Drug Use: No  . Sexual Activity: Not on file   Other Topics Concern  . Not on file   Social History Narrative     Allergies  Allergen Reactions  . Prolixin [Fluphenazine]     Severe back pain  . Advair Diskus [Fluticasone-Salmeterol] Anxiety     Outpatient Prescriptions Prior to Visit  Medication Sig Dispense Refill  . amLODipine (NORVASC)  10 MG tablet Take 1 tablet (10 mg total) by mouth daily. 30 tablet 1  . Ascorbic Acid (VITAMIN C) 1000 MG tablet Take 1,000 mg by mouth daily.    . clonazePAM (KLONOPIN) 1 MG tablet Take 1 mg by mouth 2 (two) times daily.    Marland Kitchen dextromethorphan-guaiFENesin (MUCINEX DM) 30-600 MG per 12 hr tablet Take 1 tablet by mouth every 12 (twelve) hours.    . Flaxseed, Linseed, (BIO-FLAX) 1000 MG CAPS Take 1,000 mg by mouth 2 (two) times daily.    Marland Kitchen HYDROcodone-acetaminophen (NORCO/VICODIN) 5-325 MG per tablet Take 1-2 tablets by mouth every 4 (four) hours as needed for moderate pain. 20 tablet 0  . ibuprofen (ADVIL,MOTRIN) 200 MG tablet Take 400 mg by mouth every 6 (six) hours as needed for mild pain or  moderate pain.    Marland Kitchen ipratropium-albuterol (DUONEB) 0.5-2.5 (3) MG/3ML SOLN Take 3 mLs by nebulization 4 (four) times daily as needed (Wheezing, shortness of breath).   5  . ipratropium-albuterol (DUONEB) 0.5-2.5 (3) MG/3ML SOLN   5  . loratadine (CLARITIN) 10 MG tablet Take 10 mg by mouth every 12 (twelve) hours.    . multivitamin-iron-minerals-folic acid (CENTRUM) chewable tablet Chew 1 tablet by mouth daily.    . nicotine (NICODERM CQ - DOSED IN MG/24 HOURS) 14 mg/24hr patch Place 14 mg onto the skin daily.    . prochlorperazine (COMPAZINE) 10 MG tablet Take 1 tablet (10 mg total) by mouth every 6 (six) hours as needed for nausea or vomiting. 30 tablet 0  . PROVENTIL HFA 108 (90 BASE) MCG/ACT inhaler Inhale 2 puffs into the lungs every 6 (six) hours as needed.    . Saw Palmetto, Serenoa repens, (SAW PALMETTO PO) Take 2 tablets by mouth 3 (three) times daily.    . ST JOHNS WORT PO Take 1 tablet by mouth 3 (three) times daily.    . SYMBICORT 160-4.5 MCG/ACT inhaler Inhale 2 puffs into the lungs 2 (two) times daily.    Marland Kitchen levofloxacin (LEVAQUIN) 750 MG tablet Take 1 tablet (750 mg total) by mouth daily. (Patient not taking: Reported on 08/15/2014) 4 tablet 0   No facility-administered medications prior to visit.      Review of Systems  Constitutional: Positive for fatigue. Negative for fever, chills, activity change and appetite change.  HENT: Negative for congestion, ear pain, hearing loss, postnasal drip, rhinorrhea, sinus pressure and sneezing.   Eyes: Negative for redness, itching and visual disturbance.  Respiratory: Positive for cough and shortness of breath. Negative for chest tightness and wheezing.   Cardiovascular: Negative for chest pain, palpitations and leg swelling.  Gastrointestinal: Negative for nausea, vomiting, abdominal pain, diarrhea, constipation, blood in stool and abdominal distention.  Musculoskeletal: Negative for myalgias, joint swelling, arthralgias, gait problem,  neck pain and neck stiffness.  Skin: Negative for rash.  Neurological: Negative for dizziness, light-headedness, numbness and headaches.  Hematological: Does not bruise/bleed easily.  Psychiatric/Behavioral: Negative for confusion and dysphoric mood.       Objective:   Physical Exam Filed Vitals:   08/19/14 1546  BP: 120/78  Pulse: 95  Height: 5\' 11"  (1.803 m)  Weight: 185 lb (83.915 kg)  SpO2: 94%   2L [place]  Gen: chronically ill appearing, no acute distress HEENT: NCAT, PERRL, EOMi, OP clear, neck supple without masses PULM: poor air movement, no wheezing CV: RRR, no mgr, no JVD AB: BS+, soft, nontender, no hsm Ext: warm, no edema, no clubbing, no cyanosis Derm: no rash or skin breakdown  Neuro: A&Ox4, CN II-XII intact, strength 5/5 in all 4 extremities  07/2014 CT chest images reviewed 08/2014 PET CT images reviewed Oncology notes from hospitalization reviewed Bronchoscopy records reviewed  March 2016 pulmonary function test> ratio 50%, FEV1 1.71 L (49% predicted, 18% change with bronchodilator), total lung capacity 6.73 L (93% predicted), DLCO 11.13 (33% predicted).     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 His symptoms have been well controlled on Symbicort and albuterol. I explained to him that he can use the albuterol nebulizer on an as-needed basis rather than routinely.  Plan: -Continue Symbicort twice a day -Use short acting bronchodilator on an as-needed basis   Squamous cell carcinoma of lung, stage II At this point is tumor does not seem amenable to resection based on its proximal location to the left upper lobe orifice. However, hopefully we will see improvement after chemotherapy and radiation therapy. I would recommend repeat PFTs and imaging after chemotherapy and his radiation therapy and potentially surgery could be reconsidered.  I  do not feel that he would be a good candidate for lobectomy, but I suppose that could be further assessed with a VQ scan if it was felt lobectomy was in his best interest.  Plan: -Chemotherapy and radiation therapy per oncology and radiation oncology -Would recommend repeat PFT and chemotherapy after treatment in order to reconsider lobectomy.   Acute on chronic respiratory failure with hypoxemia Continue 2 L of O2 continuously. Will prescribe portable oxygen concentrator.     Updated Medication List Outpatient Encounter Prescriptions as of 08/19/2014  Medication Sig  . amLODipine (NORVASC) 10 MG tablet Take 1 tablet (10 mg total) by mouth daily.  . Ascorbic Acid (VITAMIN C) 1000 MG tablet Take 1,000 mg by mouth daily.  . clonazePAM (KLONOPIN) 1 MG tablet Take 1 mg by mouth 2 (two) times daily.  Marland Kitchen dextromethorphan-guaiFENesin (MUCINEX DM) 30-600 MG per 12 hr tablet Take 1 tablet by mouth every 12 (twelve) hours.  . Flaxseed, Linseed, (BIO-FLAX) 1000 MG CAPS Take 1,000 mg by mouth 2 (two) times daily.  Marland Kitchen HYDROcodone-acetaminophen (NORCO/VICODIN) 5-325 MG per tablet Take 1-2 tablets by mouth every 4 (four) hours as needed for moderate pain.  Marland Kitchen ibuprofen (ADVIL,MOTRIN) 200 MG tablet Take 400 mg by mouth every 6 (six) hours as needed for mild pain or moderate pain.  Marland Kitchen ipratropium-albuterol (DUONEB) 0.5-2.5 (3) MG/3ML SOLN Take 3 mLs by nebulization 4 (four) times daily as needed (Wheezing, shortness of breath).   Marland Kitchen ipratropium-albuterol (DUONEB) 0.5-2.5 (3) MG/3ML SOLN   . loratadine (CLARITIN) 10 MG tablet Take 10 mg by mouth every 12 (twelve) hours.  . multivitamin-iron-minerals-folic acid (CENTRUM) chewable tablet Chew 1 tablet by mouth daily.  . nicotine (NICODERM CQ - DOSED IN MG/24 HOURS) 14 mg/24hr patch Place 14 mg onto the skin daily.  . prochlorperazine (COMPAZINE) 10 MG tablet Take 1 tablet (10 mg total) by mouth every 6 (six) hours as needed for nausea or vomiting.  Marland Kitchen PROVENTIL  HFA 108 (90 BASE) MCG/ACT inhaler Inhale 2 puffs into the lungs every 6 (six) hours as needed.  . Saw Palmetto, Serenoa repens, (SAW PALMETTO PO) Take 2 tablets by mouth 3 (three) times daily.  . ST JOHNS WORT PO Take 1 tablet by mouth 3 (three) times daily.  . SYMBICORT 160-4.5 MCG/ACT inhaler Inhale 2 puffs into the lungs 2 (two) times  daily.  . [DISCONTINUED] levofloxacin (LEVAQUIN) 750 MG tablet Take 1 tablet (750 mg total) by mouth daily. (Patient not taking: Reported on 08/15/2014)

## 2014-08-20 ENCOUNTER — Encounter (HOSPITAL_COMMUNITY): Payer: Self-pay | Admitting: *Deleted

## 2014-08-20 DIAGNOSIS — C3412 Malignant neoplasm of upper lobe, left bronchus or lung: Secondary | ICD-10-CM | POA: Diagnosis not present

## 2014-08-20 DIAGNOSIS — R52 Pain, unspecified: Secondary | ICD-10-CM | POA: Diagnosis not present

## 2014-08-20 DIAGNOSIS — Z510 Encounter for antineoplastic radiation therapy: Secondary | ICD-10-CM | POA: Diagnosis not present

## 2014-08-20 DIAGNOSIS — Z8701 Personal history of pneumonia (recurrent): Secondary | ICD-10-CM | POA: Diagnosis not present

## 2014-08-20 DIAGNOSIS — L598 Other specified disorders of the skin and subcutaneous tissue related to radiation: Secondary | ICD-10-CM | POA: Diagnosis not present

## 2014-08-20 DIAGNOSIS — Z79891 Long term (current) use of opiate analgesic: Secondary | ICD-10-CM | POA: Diagnosis not present

## 2014-08-20 MED ORDER — CEFAZOLIN SODIUM-DEXTROSE 2-3 GM-% IV SOLR
2.0000 g | Freq: Once | INTRAVENOUS | Status: AC
  Administered 2014-08-21: 2 g via INTRAVENOUS
  Filled 2014-08-20: qty 50

## 2014-08-21 ENCOUNTER — Encounter (HOSPITAL_COMMUNITY)
Admission: RE | Disposition: A | Discharge status: Home / Self Care | Payer: Self-pay | Source: Ambulatory Visit | Attending: Dentistry

## 2014-08-21 ENCOUNTER — Encounter (HOSPITAL_COMMUNITY): Payer: Self-pay | Admitting: *Deleted

## 2014-08-21 ENCOUNTER — Ambulatory Visit (HOSPITAL_COMMUNITY): Payer: Medicare Other | Admitting: Anesthesiology

## 2014-08-21 ENCOUNTER — Ambulatory Visit (HOSPITAL_COMMUNITY)
Admission: RE | Admit: 2014-08-21 | Discharge: 2014-08-21 | Disposition: A | Discharge status: Home / Self Care | Payer: Medicare Other | Source: Ambulatory Visit | Attending: Dentistry | Admitting: Dentistry

## 2014-08-21 DIAGNOSIS — C3412 Malignant neoplasm of upper lobe, left bronchus or lung: Secondary | ICD-10-CM

## 2014-08-21 DIAGNOSIS — I10 Essential (primary) hypertension: Secondary | ICD-10-CM | POA: Insufficient documentation

## 2014-08-21 DIAGNOSIS — K083 Retained dental root: Secondary | ICD-10-CM | POA: Insufficient documentation

## 2014-08-21 DIAGNOSIS — Z9981 Dependence on supplemental oxygen: Secondary | ICD-10-CM | POA: Insufficient documentation

## 2014-08-21 DIAGNOSIS — C3490 Malignant neoplasm of unspecified part of unspecified bronchus or lung: Secondary | ICD-10-CM | POA: Insufficient documentation

## 2014-08-21 DIAGNOSIS — K0889 Other specified disorders of teeth and supporting structures: Secondary | ICD-10-CM | POA: Diagnosis present

## 2014-08-21 DIAGNOSIS — Z87891 Personal history of nicotine dependence: Secondary | ICD-10-CM | POA: Diagnosis not present

## 2014-08-21 DIAGNOSIS — J42 Unspecified chronic bronchitis: Secondary | ICD-10-CM | POA: Insufficient documentation

## 2014-08-21 DIAGNOSIS — K0530 Chronic periodontitis, unspecified: Secondary | ICD-10-CM | POA: Diagnosis present

## 2014-08-21 DIAGNOSIS — M264 Malocclusion, unspecified: Secondary | ICD-10-CM | POA: Diagnosis present

## 2014-08-21 DIAGNOSIS — J9621 Acute and chronic respiratory failure with hypoxia: Secondary | ICD-10-CM | POA: Insufficient documentation

## 2014-08-21 DIAGNOSIS — K029 Dental caries, unspecified: Secondary | ICD-10-CM | POA: Diagnosis not present

## 2014-08-21 DIAGNOSIS — K045 Chronic apical periodontitis: Secondary | ICD-10-CM | POA: Diagnosis present

## 2014-08-21 DIAGNOSIS — J449 Chronic obstructive pulmonary disease, unspecified: Secondary | ICD-10-CM | POA: Diagnosis not present

## 2014-08-21 HISTORY — DX: Unspecified convulsions: R56.9

## 2014-08-21 HISTORY — DX: Anxiety disorder, unspecified: F41.9

## 2014-08-21 HISTORY — PX: MULTIPLE EXTRACTIONS WITH ALVEOLOPLASTY: SHX5342

## 2014-08-21 SURGERY — MULTIPLE EXTRACTION WITH ALVEOLOPLASTY
Anesthesia: General | Site: Mouth

## 2014-08-21 MED ORDER — HYDROCODONE-ACETAMINOPHEN 5-325 MG PO TABS: 1.0000 | ORAL_TABLET | Freq: Four times a day (QID) | ORAL | Status: DC | PRN

## 2014-08-21 MED ORDER — NEOSTIGMINE METHYLSULFATE 10 MG/10ML IV SOLN
INTRAVENOUS | Status: DC | PRN
  Administered 2014-08-21: 3 mg via INTRAVENOUS

## 2014-08-21 MED ORDER — FENTANYL CITRATE 0.05 MG/ML IJ SOLN
INTRAMUSCULAR | Status: AC
  Filled 2014-08-21: qty 5

## 2014-08-21 MED ORDER — STERILE WATER FOR INJECTION IJ SOLN
INTRAMUSCULAR | Status: AC
  Filled 2014-08-21: qty 10

## 2014-08-21 MED ORDER — LIDOCAINE HCL (CARDIAC) 20 MG/ML IV SOLN
INTRAVENOUS | Status: DC | PRN
  Administered 2014-08-21: 100 mg via INTRAVENOUS

## 2014-08-21 MED ORDER — BUPIVACAINE-EPINEPHRINE 0.5% -1:200000 IJ SOLN
INTRAMUSCULAR | Status: DC | PRN
  Administered 2014-08-21 (×2): 1.8 mL

## 2014-08-21 MED ORDER — FENTANYL CITRATE 0.05 MG/ML IJ SOLN
25.0000 ug | INTRAMUSCULAR | Status: DC | PRN
  Administered 2014-08-21: 25 ug via INTRAVENOUS

## 2014-08-21 MED ORDER — OXYCODONE HCL 5 MG/5ML PO SOLN
5.0000 mg | Freq: Once | ORAL | Status: AC | PRN
  Administered 2014-08-21: 5 mg via ORAL

## 2014-08-21 MED ORDER — BUPIVACAINE-EPINEPHRINE (PF) 0.5% -1:200000 IJ SOLN
INTRAMUSCULAR | Status: AC
  Filled 2014-08-21: qty 3.6

## 2014-08-21 MED ORDER — PROPOFOL 10 MG/ML IV BOLUS
INTRAVENOUS | Status: DC | PRN
  Administered 2014-08-21: 150 mg via INTRAVENOUS

## 2014-08-21 MED ORDER — LIDOCAINE-EPINEPHRINE 2 %-1:100000 IJ SOLN
INTRAMUSCULAR | Status: DC | PRN
  Administered 2014-08-21 (×6): 1.7 mL via INTRADERMAL

## 2014-08-21 MED ORDER — FENTANYL CITRATE 0.05 MG/ML IJ SOLN
INTRAMUSCULAR | Status: DC
Start: 2014-08-21 — End: 2014-08-21
  Filled 2014-08-21: qty 2

## 2014-08-21 MED ORDER — OXYCODONE HCL 5 MG PO TABS
5.0000 mg | ORAL_TABLET | Freq: Once | ORAL | Status: AC
  Administered 2014-08-21: 5 mg via ORAL

## 2014-08-21 MED ORDER — ROCURONIUM BROMIDE 50 MG/5ML IV SOLN
INTRAVENOUS | Status: AC
  Filled 2014-08-21: qty 1

## 2014-08-21 MED ORDER — NEOSTIGMINE METHYLSULFATE 10 MG/10ML IV SOLN
INTRAVENOUS | Status: AC
  Filled 2014-08-21: qty 1

## 2014-08-21 MED ORDER — LACTATED RINGERS IV SOLN
INTRAVENOUS | Status: DC
  Administered 2014-08-21: 08:00:00 via INTRAVENOUS

## 2014-08-21 MED ORDER — 0.9 % SODIUM CHLORIDE (POUR BTL) OPTIME
TOPICAL | Status: DC | PRN
  Administered 2014-08-21: 1000 mL

## 2014-08-21 MED ORDER — MIDAZOLAM HCL 2 MG/2ML IJ SOLN
INTRAMUSCULAR | Status: AC
  Filled 2014-08-21: qty 2

## 2014-08-21 MED ORDER — ONDANSETRON HCL 4 MG/2ML IJ SOLN
INTRAMUSCULAR | Status: DC | PRN
  Administered 2014-08-21: 4 mg via INTRAVENOUS

## 2014-08-21 MED ORDER — MIDAZOLAM HCL 5 MG/5ML IJ SOLN
INTRAMUSCULAR | Status: DC | PRN
  Administered 2014-08-21: 2 mg via INTRAVENOUS

## 2014-08-21 MED ORDER — OXYCODONE HCL 5 MG PO TABS: 5.0000 mg | ORAL_TABLET | Freq: Once | ORAL | Status: AC | PRN

## 2014-08-21 MED ORDER — ARTIFICIAL TEARS OP OINT
TOPICAL_OINTMENT | OPHTHALMIC | Status: DC | PRN
  Administered 2014-08-21: 1 via OPHTHALMIC

## 2014-08-21 MED ORDER — GLYCOPYRROLATE 0.2 MG/ML IJ SOLN
INTRAMUSCULAR | Status: DC | PRN
  Administered 2014-08-21: 0.6 mg via INTRAVENOUS

## 2014-08-21 MED ORDER — EPHEDRINE SULFATE 50 MG/ML IJ SOLN
INTRAMUSCULAR | Status: AC
  Filled 2014-08-21: qty 1

## 2014-08-21 MED ORDER — FENTANYL CITRATE 0.05 MG/ML IJ SOLN
INTRAMUSCULAR | Status: DC | PRN
  Administered 2014-08-21: 50 ug via INTRAVENOUS
  Administered 2014-08-21: 100 ug via INTRAVENOUS
  Administered 2014-08-21 (×2): 50 ug via INTRAVENOUS

## 2014-08-21 MED ORDER — LIDOCAINE-EPINEPHRINE 2 %-1:100000 IJ SOLN
INTRAMUSCULAR | Status: AC
  Filled 2014-08-21: qty 10.2

## 2014-08-21 MED ORDER — LIDOCAINE HCL (CARDIAC) 20 MG/ML IV SOLN
INTRAVENOUS | Status: AC
  Filled 2014-08-21: qty 5

## 2014-08-21 MED ORDER — ONDANSETRON HCL 4 MG/2ML IJ SOLN
INTRAMUSCULAR | Status: AC
  Filled 2014-08-21: qty 2

## 2014-08-21 MED ORDER — LACTATED RINGERS IV SOLN
INTRAVENOUS | Status: DC | PRN
  Administered 2014-08-21: 10:00:00 via INTRAVENOUS

## 2014-08-21 MED ORDER — GLYCOPYRROLATE 0.2 MG/ML IJ SOLN
INTRAMUSCULAR | Status: AC
  Filled 2014-08-21: qty 3

## 2014-08-21 MED ORDER — OXYCODONE HCL 5 MG PO TABS
ORAL_TABLET | ORAL | Status: AC
  Filled 2014-08-21: qty 1

## 2014-08-21 MED ORDER — ROCURONIUM BROMIDE 100 MG/10ML IV SOLN
INTRAVENOUS | Status: DC | PRN
  Administered 2014-08-21: 50 mg via INTRAVENOUS

## 2014-08-21 MED ORDER — ARTIFICIAL TEARS OP OINT
TOPICAL_OINTMENT | OPHTHALMIC | Status: AC
  Filled 2014-08-21: qty 3.5

## 2014-08-21 MED ORDER — OXYCODONE HCL 5 MG/5ML PO SOLN
ORAL | Status: DC
Start: 2014-08-21 — End: 2014-08-21
  Filled 2014-08-21: qty 5

## 2014-08-21 SURGICAL SUPPLY — 33 items
ALCOHOL 70% 16 OZ (MISCELLANEOUS) ×2 IMPLANT
ATTRACTOMAT 16X20 MAGNETIC DRP (DRAPES) ×2 IMPLANT
BLADE SURG 15 STRL LF DISP TIS (BLADE) ×2 IMPLANT
BLADE SURG 15 STRL SS (BLADE) ×2
COVER SURGICAL LIGHT HANDLE (MISCELLANEOUS) ×2 IMPLANT
GAUZE PACKING FOLDED 2  STR (GAUZE/BANDAGES/DRESSINGS) ×1
GAUZE PACKING FOLDED 2 STR (GAUZE/BANDAGES/DRESSINGS) ×1 IMPLANT
GAUZE SPONGE 4X4 16PLY XRAY LF (GAUZE/BANDAGES/DRESSINGS) ×2 IMPLANT
GLOVE BIOGEL PI IND STRL 6 (GLOVE) ×1 IMPLANT
GLOVE BIOGEL PI INDICATOR 6 (GLOVE) ×1
GLOVE SURG ORTHO 8.0 STRL STRW (GLOVE) ×2 IMPLANT
GLOVE SURG SS PI 6.0 STRL IVOR (GLOVE) ×2 IMPLANT
GOWN STRL REUS W/ TWL LRG LVL3 (GOWN DISPOSABLE) ×1 IMPLANT
GOWN STRL REUS W/TWL 2XL LVL3 (GOWN DISPOSABLE) ×2 IMPLANT
GOWN STRL REUS W/TWL LRG LVL3 (GOWN DISPOSABLE) ×1
HEMOSTAT SURGICEL 2X14 (HEMOSTASIS) ×2 IMPLANT
KIT BASIN OR (CUSTOM PROCEDURE TRAY) ×2 IMPLANT
KIT ROOM TURNOVER OR (KITS) ×2 IMPLANT
MANIFOLD NEPTUNE WASTE (CANNULA) ×2 IMPLANT
NEEDLE BLUNT 16X1.5 OR ONLY (NEEDLE) ×2 IMPLANT
NS IRRIG 1000ML POUR BTL (IV SOLUTION) ×2 IMPLANT
PACK EENT II TURBAN DRAPE (CUSTOM PROCEDURE TRAY) ×2 IMPLANT
PAD ARMBOARD 7.5X6 YLW CONV (MISCELLANEOUS) ×2 IMPLANT
SPONGE SURGIFOAM ABS GEL 100 (HEMOSTASIS) IMPLANT
SPONGE SURGIFOAM ABS GEL 12-7 (HEMOSTASIS) IMPLANT
SPONGE SURGIFOAM ABS GEL SZ50 (HEMOSTASIS) IMPLANT
SUCTION FRAZIER TIP 10 FR DISP (SUCTIONS) ×2 IMPLANT
SUT CHROMIC 3 0 PS 2 (SUTURE) ×4 IMPLANT
SUT CHROMIC 4 0 P 3 18 (SUTURE) ×2 IMPLANT
SYR 50ML SLIP (SYRINGE) ×2 IMPLANT
TOWEL OR 17X26 10 PK STRL BLUE (TOWEL DISPOSABLE) ×2 IMPLANT
TUBE CONNECTING 12X1/4 (SUCTIONS) ×2 IMPLANT
YANKAUER SUCT BULB TIP NO VENT (SUCTIONS) ×2 IMPLANT

## 2014-08-21 NOTE — Discharge Instructions (Signed)
MOUTH CARE AFTER SURGERY ° °FACTS: °· Ice used in ice bag helps keep the swelling down, and can help lessen the pain. °· It is easier to treat pain BEFORE it happens. °· Spitting disturbs the clot and may cause bleeding to start again, or to get worse. °· Smoking delays healing and can cause complications. °· Sharing prescriptions can be dangerous.  Do not take medications not recently prescribed for you. °· Antibiotics may stop birth control pills from working.  Use other means of birth control while on antibiotics. °· Warm salt water rinses after the first 24 hours will help lessen the swelling:  Use 1/2 teaspoonful of table salt per oz.of water. ° °DO NOT: °· Do not spit.  Do not drink through a straw. °· Strongly advised not to smoke, dip snuff or chew tobacco at least for 3 days. °· Do not eat sharp or crunchy foods.  Avoid the area of surgery when chewing. °· Do not stop your antibiotics before your instructions say to do so. °· Do not eat hot foods until bleeding has stopped.  If you need to, let your food cool down to room temperature. ° °EXPECT: °· Some swelling, especially first 2-3 days. °· Soreness or discomfort in varying degrees.  Follow your dentist's instructions about how to handle pain before it starts. °· Pinkish saliva or light blood in saliva, or on your pillow in the morning.  This can last around 24 hours. °· Bruising inside or outside the mouth.  This may not show up until 2-3 days after surgery.  Don't worry, it will go away in time. °· Pieces of "bone" may work themselves loose.  It's OK.  If they bother you, let us know. ° °WHAT TO DO IMMEDIATELY AFTER SURGERY: °· Bite on the gauze with steady pressure for 1-2 hours.  Don't chew on the gauze. °· Do not lie down flat.  Raise your head support especially for the first 24 hours. °· Apply ice to your face on the side of the surgery.  You may apply it 20 minutes on and a few minutes off.  Ice for 8-12 hours.  You may use ice up to 24  hours. °· Before the numbness wears off, take a pain pill as instructed. °· Prescription pain medication is not always required. ° °SWELLING: °· Expect swelling for the first couple of days.  It should get better after that. °· If swelling increases 3 days or so after surgery; let us know as soon as possible. ° °FEVER: °· Take Tylenol every 4 hours if needed to lower your temperature, especially if it is at 100F or higher. °· Drink lots of fluids. °· If the fever does not go away, let us know. ° °BREATHING TROUBLE: °· Any unusual difficulty breathing means you have to have someone bring you to the emergency room ASAP ° °BLEEDING: °· Light oozing is expected for 24 hours or so. °· Prop head up with pillows °· Avoid spitting °· Do not confuse bright red fresh flowing blood with lots of saliva colored with a little bit of blood. °· If you notice some bleeding, place gauze or a tea bag where it is bleeding and apply CONSTANT pressure by biting down for 1 hour.  Avoid talking during this time.  Do not remove the gauze or tea bag during this hour to "check" the bleeding. °· If you notice bright RED bleeding FLOWING out of particular area, and filling the floor of your mouth, put   a wad of gauze on that area, bite down firmly and constantly.  Call us immediately.  If we're closed, have someone bring you to the emergency room.  ORAL HYGIENE:  Brush your teeth as usual after meals and before bedtime.  Use a soft toothbrush around the area of surgery.  DO NOT AVOID BRUSHING.  Otherwise bacteria(germs) will grow and may delay healing or encourage infection.  Since you cannot spit, just gently rinse and let the water flow out of your mouth.  DO NOT SWISH HARD.  EATING:  Cool liquids are a good point to start.  Increase to soft foods as tolerated.  PRESCRIPTIONS:  Follow the directions for your prescriptions exactly as written.  If Dr. Enrique Sack gave you a narcotic pain medication, do not drive, operate  machinery or drink alcohol when on that medication.  QUESTIONS:  Call our office during office hours [phone] or call the Emergency Room at [phone].Marland Kitchen  Sore Throat    A sore throat is a painful, burning, sore, or scratchy feeling of the throat. There may be pain or tenderness when swallowing or talking. You may have other symptoms with a sore throat. These include coughing, sneezing, fever, or a swollen neck. A sore throat is often the first sign of another sickness. These sicknesses may include a cold, flu, strep throat, or an infection called mono. Most sore throats go away without medical treatment.  HOME CARE  Only take medicine as told by your doctor.  Drink enough fluids to keep your pee (urine) clear or pale yellow.  Rest as needed.  Try using throat sprays, lozenges, or suck on hard candy (if older than 4 years or as told).  Sip warm liquids, such as broth, herbal tea, or warm water with honey. Try sucking on frozen ice pops or drinking cold liquids.  Rinse the mouth (gargle) with salt water. Mix 1 teaspoon salt with 8 ounces of water.  Do not smoke. Avoid being around others when they are smoking.  Put a humidifier in your bedroom at night to moisten the air. You can also turn on a hot shower and sit in the bathroom for 5-10 minutes. Be sure the bathroom door is closed. GET HELP RIGHT AWAY IF:  You have trouble breathing.  You cannot swallow fluids, soft foods, or your spit (saliva).  You have more puffiness (swelling) in the throat.  Your sore throat does not get better in 7 days.  You feel sick to your stomach (nauseous) and throw up (vomit).  You have a fever or lasting symptoms for more than 2-3 days.  You have a fever and your symptoms suddenly get worse. MAKE SURE YOU:  Understand these instructions.  Will watch your condition.  Will get help right away if you are not doing well or get worse. Document Released: 03/01/2008 Document Revised: 02/15/2012 Document  Reviewed: 01/29/2012  [facility] Patient Information 2015 [place], [place]. This information is not intended to replace advice given to you by your health care provider. Make sure you discuss any questions you have with your health care provider.   General Anesthesia, Adult, Care After  Refer to this sheet in the next few weeks. These instructions provide you with information on caring for yourself after your procedure. Your health care provider may also give you more specific instructions. Your treatment has been planned according to current medical practices, but problems sometimes occur. Call your health care provider if you have any problems or questions after your procedure.  WHAT TO EXPECT AFTER THE PROCEDURE  After the procedure, it is typical to experience:  Sleepiness.  Nausea and vomiting. HOME CARE INSTRUCTIONS  For the first 24 hours after general anesthesia:  Have a responsible person with you.  Do not drive a car. If you are alone, do not take public transportation.  Do not drink alcohol.  Do not take medicine that has not been prescribed by your health care provider.  Do not sign important papers or make important decisions.  You may resume a normal diet and activities as directed by your health care provider.  Change bandages (dressings) as directed.  If you have questions or problems that seem related to general anesthesia, call the hospital and ask for the anesthetist or anesthesiologist on call. SEEK MEDICAL CARE IF:  You have nausea and vomiting that continue the day after anesthesia.  You develop a rash. SEEK IMMEDIATE MEDICAL CARE IF:  You have difficulty breathing.  You have chest pain.  You have any allergic problems. Document Released: 08/29/2000 Document Revised: 01/23/2013 Document Reviewed: 12/06/2012  [facility] Patient Information 2014 [place], [place].

## 2014-08-21 NOTE — Anesthesia Procedure Notes (Signed)
Procedure Name: Intubation Date/Time: 08/21/2014 10:13 AM Performed by: Scheryl Darter Pre-anesthesia Checklist: Patient identified, Emergency Drugs available, Suction available, Patient being monitored and Timeout performed Patient Re-evaluated:Patient Re-evaluated prior to inductionOxygen Delivery Method: Circle system utilized Preoxygenation: Pre-oxygenation with 100% oxygen Intubation Type: IV induction Ventilation: Mask ventilation without difficulty Laryngoscope Size: Miller and 3 Grade View: Grade I Tube type: Oral Tube size: 7.5 mm Number of attempts: 1 Airway Equipment and Method: Stylet Placement Confirmation: ETT inserted through vocal cords under direct vision,  positive ETCO2 and breath sounds checked- equal and bilateral Secured at: 23 cm Tube secured with: Tape Dental Injury: Teeth and Oropharynx as per pre-operative assessment

## 2014-08-21 NOTE — Transfer of Care (Signed)
Immediate Anesthesia Transfer of Care Note  Patient: Jeffrey Hines  Procedure(s) Performed: Procedure(s): extraction of tooth #'s 6,8,9,11,20,21,22,23,24,27,28,29, and 30 with alveoloplasty (N/A)  Patient Location: PACU  Anesthesia Type:General  Level of Consciousness: awake, alert , oriented and sedated  Airway & Oxygen Therapy: Patient Spontanous Breathing and Patient connected to nasal cannula oxygen  Post-op Assessment: Report given to RN, Post -op Vital signs reviewed and stable and Patient moving all extremities  Post vital signs: Reviewed and stable  Last Vitals:  Filed Vitals:   08/21/14 0712  BP: 132/84  Pulse: 79  Temp: 36.3 C  Resp: 20    Complications: No apparent anesthesia complications

## 2014-08-21 NOTE — Anesthesia Preprocedure Evaluation (Signed)
Anesthesia Evaluation    Airway       Dental   Pulmonary former smoker,          Cardiovascular hypertension,     Neuro/Psych    GI/Hepatic   Endo/Other    Renal/GU      Musculoskeletal   Abdominal   Peds  Hematology   Anesthesia Other Findings   Reproductive/Obstetrics                           Anesthesia Physical Anesthesia Plan  ASA: III  Anesthesia Plan:    Post-op Pain Management:    Induction:   Airway Management Planned:   Additional Equipment:   Intra-op Plan:   Post-operative Plan:   Informed Consent:   Plan Discussed with:   Anesthesia Plan Comments:         Anesthesia Quick Evaluation  

## 2014-08-21 NOTE — Progress Notes (Signed)
PRE-OPERATIVE NOTE:  08/21/2014   Patient:            Jeffrey Hines Date of Birth:  1946-02-13 MRN:                [id number]    VITALS: BP 132/84 mmHg  Pulse 79  Temp(Src) 97.4 F (36.3 C) (Oral)  Resp 20  Wt 185 lb (83.915 kg)  SpO2 98%  Lab Results  Component Value Date   WBC 11.1* 08/08/2014   HGB 14.1 08/08/2014   HCT 43.7 08/08/2014   MCV 91.8 08/08/2014   PLT 220 08/08/2014   BMET    Component Value Date/Time   NA 139 08/08/2014 0401   K 4.3 08/08/2014 0401   CL 101 08/08/2014 0401   CO2 33* 08/08/2014 0401   GLUCOSE 96 08/08/2014 0401   BUN 25* 08/08/2014 0401   CREATININE 0.87 08/08/2014 0401   CALCIUM 8.6 08/08/2014 0401   GFRNONAA 87* 08/08/2014 0401   GFRAA >90 08/08/2014 0401    No results found for: INR, PROTIME No results found for: PTT   Jeffrey Hines presents for multiple dental extractions with alveoloplasty and pre-prosthetic surgery as needed in the OR with general anesthesia.  SUBJECTIVE: The patient denies any acute medical or dental changes and agrees to proceed with treatment as planned.  EXAM: No sign of acute dental changes.  ASSESSMENT: Patient is affected by chronic apical periodontitis, retained dental roots, chronic periodontitis, dental caries,  and malocclusion.  PLAN: Patient agrees to proceed with treatment as planned in the operating room as previously discussed and accepts the risks, benefits, and complications of the proposed treatment. Patient is aware of the risk for bleeding, bruising, swelling, infection, pain, nerve damage, soft tissue damage, sinus involvement, root tip fracture, mandible fracture, and the risks of respiratory complications associated with the anesthesia. Patient also is aware of the potential for other complications not mentioned above.   Lenn Cal, DDS

## 2014-08-21 NOTE — H&P (Signed)
08/21/2014  Patient:            Jeffrey Hines Date of Birth:  Mar 03, 1946 MRN:                [id number]   BP 132/84 mmHg  Pulse 79  Temp(Src) 97.4 F (36.3 C) (Oral)  Resp 20  Wt 185 lb (83.915 kg)  SpO2 98%  [name] presents for multiple dental extractions with alveoloplasty and pre-prosthetic surgery as needed in the OR with General anesthesia. Patient with recent diagnosis of lung cancer with anticipated chemoradiation therapy. Patient has significant improvement in breathing by his report. Patient denies acute medical or dental changes. Last seen by Dr. Lake Bells with [facility]. Will use that note of 08/19/14 as H and P for dental OR procedures.    Jori Moll. Carlos Levering, DDS    Progress Notes    Expand All Collapse All     Subjective:    Patient ID: Jeffrey Hines, male DOB: December 06, 1945, 69 y.o. MRN: [id number]  HPI  Chief Complaint  Patient presents with  . Hospitalization Follow-up    Breathing is doing well since leaving hospital    This is a very pleasant 69 year old male who was recently hospitalized in [place] for a lung mass, Pseudomonas pneumonia, and a pneumothorax. He was evaluated by thoracic surgery as well as my partners during his hospital stay. He was initially treated with a chest tube but then was found to have a left upper lobe mass. He underwent a bronchoscopy by my partner and biopsies confirmed a diagnosis of squamous cell carcinoma. The mass was noted to be in the orifice of the left upper lobe. Lung function testing performed during his hospital stay showed: March 2016 pulmonary function test> ratio 50%, FEV1 1.71 L (49% predicted, 18% change with bronchodilator), total lung capacity 6.73 L (93% predicted), DLCO 11.13 (33% predicted).  He was treated for pneumonia, and his chest tube was removed prior to discharge. He was seen by oncology during his visit and plans were made for radiation and chemotherapy as it was felt that  his tumor would not be resectable to lobectomy based on its location.  Since going home he says his breathing has improved significantly and he is feeling better than he has in months. He continues to take Symbicort 2 puffs twice a day. He uses his albuterol nebulizer 4 times a day on a routine basis. He says his energy level has improved. He is using 2 L of oxygen continuously. He still has some dyspnea as well as a cough with occasional mucus production but his symptoms are overall very much improved.   Past Medical History  Diagnosis Date  . COPD (chronic obstructive pulmonary disease)   . Closed head injury 1998  . Multiple rib fractures 1998    left side  . Shoulder dislocation 1998    left  . COPD with chronic bronchitis   . Tobacco abuse   . Hypertension   . Spontaneous pneumothorax 08/03/2014    Left side 1st time episode spontaneous pneumothorax associated with acute flare of COPD   . Hemoptysis 08/03/2014  . Cancer   . Post-obstruction pneumonia due to Pseudomonas aeruginosa 08/07/2014    Endobronchial mass causing LUL obstruction     Family History  Problem Relation Age of Onset  . Lung cancer Mother      History   Social History  . Marital Status: Married    Spouse Name: N/A  . Number of Children: 1  .  Years of Education: N/A   Occupational History  . motorcycle repair    Social History Main Topics  . Smoking status: Former Smoker -- 0.50 packs/day for 50 years    Types: Cigarettes    Quit date: 08/03/2014  . Smokeless tobacco: Never Used  . 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  . Drug Use: No  . Sexual Activity: Not on file   Other Topics Concern  . Not on file   Social History Narrative     Allergies  Allergen Reactions  .  Prolixin [Fluphenazine]     Severe back pain  . Advair Diskus [Fluticasone-Salmeterol] Anxiety     Outpatient Prescriptions Prior to Visit  Medication Sig Dispense Refill  . amLODipine (NORVASC) 10 MG tablet Take 1 tablet (10 mg total) by mouth daily. 30 tablet 1  . Ascorbic Acid (VITAMIN C) 1000 MG tablet Take 1,000 mg by mouth daily.    . clonazePAM (KLONOPIN) 1 MG tablet Take 1 mg by mouth 2 (two) times daily.    Marland Kitchen dextromethorphan-guaiFENesin (MUCINEX DM) 30-600 MG per 12 hr tablet Take 1 tablet by mouth every 12 (twelve) hours.    . Flaxseed, Linseed, (BIO-FLAX) 1000 MG CAPS Take 1,000 mg by mouth 2 (two) times daily.    Marland Kitchen HYDROcodone-acetaminophen (NORCO/VICODIN) 5-325 MG per tablet Take 1-2 tablets by mouth every 4 (four) hours as needed for moderate pain. 20 tablet 0  . ibuprofen (ADVIL,MOTRIN) 200 MG tablet Take 400 mg by mouth every 6 (six) hours as needed for mild pain or moderate pain.    Marland Kitchen ipratropium-albuterol (DUONEB) 0.5-2.5 (3) MG/3ML SOLN Take 3 mLs by nebulization 4 (four) times daily as needed (Wheezing, shortness of breath).   5  . ipratropium-albuterol (DUONEB) 0.5-2.5 (3) MG/3ML SOLN   5  . loratadine (CLARITIN) 10 MG tablet Take 10 mg by mouth every 12 (twelve) hours.    . multivitamin-iron-minerals-folic acid (CENTRUM) chewable tablet Chew 1 tablet by mouth daily.    . nicotine (NICODERM CQ - DOSED IN MG/24 HOURS) 14 mg/24hr patch Place 14 mg onto the skin daily.    . prochlorperazine (COMPAZINE) 10 MG tablet Take 1 tablet (10 mg total) by mouth every 6 (six) hours as needed for nausea or vomiting. 30 tablet 0  . PROVENTIL HFA 108 (90 BASE) MCG/ACT inhaler Inhale 2 puffs into the lungs every 6 (six) hours as needed.    . Saw Palmetto, Serenoa repens, (SAW PALMETTO PO) Take 2 tablets by mouth 3 (three) times daily.    . ST JOHNS WORT PO Take 1 tablet by mouth 3 (three) times  daily.    . SYMBICORT 160-4.5 MCG/ACT inhaler Inhale 2 puffs into the lungs 2 (two) times daily.    Marland Kitchen levofloxacin (LEVAQUIN) 750 MG tablet Take 1 tablet (750 mg total) by mouth daily. (Patient not taking: Reported on 08/15/2014) 4 tablet 0   No facility-administered medications prior to visit.      Review of Systems  Constitutional: Positive for fatigue. Negative for fever, chills, activity change and appetite change.  HENT: Negative for congestion, ear pain, hearing loss, postnasal drip, rhinorrhea, sinus pressure and sneezing.  Eyes: Negative for redness, itching and visual disturbance.  Respiratory: Positive for cough and shortness of breath. Negative for chest tightness and wheezing.  Cardiovascular: Negative for chest pain, palpitations and leg swelling.  Gastrointestinal: Negative for  nausea, vomiting, abdominal pain, diarrhea, constipation, blood in stool and abdominal distention.  Musculoskeletal: Negative for myalgias, joint swelling, arthralgias, gait problem, neck pain and neck stiffness.  Skin: Negative for rash.  Neurological: Negative for dizziness, light-headedness, numbness and headaches.  Hematological: Does not bruise/bleed easily.  Psychiatric/Behavioral: Negative for confusion and dysphoric mood.       Objective:   Physical Exam Filed Vitals:   08/19/14 1546  BP: 120/78  Pulse: 95  Height: 5\' 11"  (1.803 m)  Weight: 185 lb (83.915 kg)  SpO2: 94%   2L Luana  Gen: chronically ill appearing, no acute distress HEENT: NCAT, PERRL, EOMi, OP clear, neck supple without masses PULM: poor air movement, no wheezing CV: RRR, no mgr, no JVD AB: BS+, soft, nontender, no hsm Ext: warm, no edema, no clubbing, no cyanosis Derm: no rash or skin breakdown Neuro: A&Ox4, CN II-XII intact, strength 5/5 in all 4 extremities  07/2014 CT chest images reviewed 08/2014 PET CT images reviewed Oncology notes from hospitalization reviewed Bronchoscopy  records reviewed  March 2016 pulmonary function test> ratio 50%, FEV1 1.71 L (49% predicted, 18% change with bronchodilator), total lung capacity 6.73 L (93% predicted), DLCO 11.13 (33% predicted).     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 His symptoms have been well controlled on Symbicort and albuterol. I explained to him that he can use the albuterol nebulizer on an as-needed basis rather than routinely.  Plan: -Continue Symbicort twice a day -Use short acting bronchodilator on an as-needed basis   Squamous cell carcinoma of lung, stage II At this point is tumor does not seem amenable to resection based on its proximal location to the left upper lobe orifice. However, hopefully we will see improvement after chemotherapy and radiation therapy. I would recommend repeat PFTs and imaging after chemotherapy and his radiation therapy and potentially surgery could be reconsidered. I do not feel that he would be a good candidate for lobectomy, but I suppose that could be further assessed with a VQ scan if it was felt lobectomy was in his best interest.  Plan: -Chemotherapy and radiation therapy per oncology and radiation oncology -Would recommend repeat PFT and chemotherapy after treatment in order to reconsider lobectomy.   Acute on chronic respiratory failure with hypoxemia Continue 2 L of O2 continuously. Will prescribe portable oxygen concentrator.     Updated Medication List Outpatient Encounter Prescriptions as of 08/19/2014  Medication Sig  . amLODipine (NORVASC) 10 MG tablet Take 1 tablet (10 mg total) by mouth daily.  . Ascorbic Acid (VITAMIN C) 1000 MG tablet Take 1,000 mg by mouth daily.  . clonazePAM (KLONOPIN) 1 MG tablet Take 1 mg by mouth 2 (two) times daily.  Marland Kitchen dextromethorphan-guaiFENesin (MUCINEX DM) 30-600 MG per 12 hr tablet  Take 1 tablet by mouth every 12 (twelve) hours.  . Flaxseed, Linseed, (BIO-FLAX) 1000 MG CAPS Take 1,000 mg by mouth 2 (two) times daily.  Marland Kitchen HYDROcodone-acetaminophen (NORCO/VICODIN) 5-325 MG per tablet Take 1-2 tablets by mouth every 4 (four) hours as needed for moderate pain.  Marland Kitchen ibuprofen (ADVIL,MOTRIN) 200 MG tablet Take 400 mg by mouth every 6 (six) hours as needed for mild pain or moderate pain.  Marland Kitchen ipratropium-albuterol (DUONEB) 0.5-2.5 (3) MG/3ML SOLN Take 3 mLs by nebulization 4 (four) times daily as needed (Wheezing, shortness of breath).   Marland Kitchen ipratropium-albuterol (DUONEB) 0.5-2.5 (3)  MG/3ML SOLN   . loratadine (CLARITIN) 10 MG tablet Take 10 mg by mouth every 12 (twelve) hours.  . multivitamin-iron-minerals-folic acid (CENTRUM) chewable tablet Chew 1 tablet by mouth daily.  . nicotine (NICODERM CQ - DOSED IN MG/24 HOURS) 14 mg/24hr patch Place 14 mg onto the skin daily.  . prochlorperazine (COMPAZINE) 10 MG tablet Take 1 tablet (10 mg total) by mouth every 6 (six) hours as needed for nausea or vomiting.  Marland Kitchen PROVENTIL HFA 108 (90 BASE) MCG/ACT inhaler Inhale 2 puffs into the lungs every 6 (six) hours as needed.  . Saw Palmetto, Serenoa repens, (SAW PALMETTO PO) Take 2 tablets by mouth 3 (three) times daily.  . ST JOHNS WORT PO Take 1 tablet by mouth 3 (three) times daily.  . SYMBICORT 160-4.5 MCG/ACT inhaler Inhale 2 puffs into the lungs 2 (two) times daily.  . [DISCONTINUED] levofloxacin (LEVAQUIN) 750 MG tablet Take 1 tablet (750 mg total) by mouth daily. (Patient not taking: Reported on 08/15/2014)

## 2014-08-21 NOTE — Op Note (Signed)
OPERATIVE REPORT  Patient:            Jeffrey Hines Date of Birth:  05-10-1946 MRN:                [id number]   DATE OF PROCEDURE:  08/21/2014  PREOPERATIVE DIAGNOSES: 1. Squamous cell carcinoma of the lung 2. Pre-chemoradiation therapy  3. Chronic apical periodontitis 4. Chronic periodontitis  5. Dental caries 6. Tooth mobility  POSTOPERATIVE DIAGNOSES: 1. Squamous cell carcinoma of the lung 2. Pre-chemoradiation therapy  3. Chronic apical periodontitis 4. Chronic periodontitis  5. Dental caries 6. Tooth mobility  OPERATIONS: 1. Multiple extraction of tooth numbers 6, 8, 9, 11, 20, 21, 22, 23, 24, 27, 28, 29, and 30 2. 4 Quadrants of alveoloplasty   SURGEON: Lenn Cal, DDS  ASSISTANT: Camie Patience, (dental assistant)  ANESTHESIA: General anesthesia via oral endotracheal tube.  MEDICATIONS: 1. Ancef 2 g IV prior to invasive dental procedures. 2. Local anesthesia with a total utilization of 5 carpules each containing 34 mg of lidocaine with 0.017 mg of epinephrine as well as 2 carpules each containing 9 mg of bupivacaine with 0.009 mg of epinephrine.  SPECIMENS: There are 13 teeth that were discarded.  DRAINS: None  CULTURES: None  COMPLICATIONS: None   ESTIMATED BLOOD LOSS: 50 mLs.  INTRAVENOUS FLUIDS: 800 mLs of Lactated ringers solution.  INDICATIONS: The patient was recently diagnosed with lung cancer.  A dental consultation was then requested to evaluate poor dentition.  The patient was examined and treatment planned for traction remaining teeth with alveoloplasty in the operating room with general anesthesia.  This treatment plan was formulated to decrease the risks and complications associated with dental infection from affecting the patient's systemic health and anticipated chemoradiation therapy.  OPERATIVE FINDINGS: Patient was examined in operating room number 9.  The teeth were identified for extraction. Patient was evaluated for possible  retained roots in the area of tooth numbers 4, 7, and 10 but these were not identified in the operating room at the time of surgery. Tooth numbers 6, 8, 9, 10, 20, 21, 22, 23, 24, 27, 28, 29, 30 were therefore identified for extraction today. The patient was noted be affected by chronic apical periodontitis, dental caries, chronic periodontitis, tooth mobility, and malocclusion.   DESCRIPTION OF PROCEDURE: Patient was brought to the main operating room number 9. Patient was then placed in the supine position on the operating table. General anesthesia was then induced per the anesthesia team. The patient was then prepped and draped in the usual manner for dental medicine procedure. A timeout was performed. The patient was identified and procedures were verified. A throat pack was placed at this time. The oral cavity was then thoroughly examined with the findings noted above. The patient was then ready for dental medicine procedure as follows:  Local anesthesia was then administered sequentially with a total utilization of 5 carpules each containing 34 mg of lidocaine with 0.017 mg of epinephrine as well as 2 carpules each containing 9 mg bupivacaine with 0.009 mg of epinephrine.  The Maxillary left and right quadrants were first approached. Anesthesia was then delivered utilizing infiltration with lidocaine with epinephrine. A #15 blade incision was then made from the mesial #3 and extended to the distal of #13.  A surgical flap was then carefully reflected. Patient was evaluated for possible retained roots in the area of tooth numbers 4, 7, and 10 but retained roots were not noted at that time. The remaining maxillary teeth  were then subluxated with a series of straight elevators. Tooth numbers 6, 8, 9, 11 were then removed with a 150 forceps without complications. Alveoloplasty was then performed utilizing a ronguers and bone file. The surgical site was then irrigated with copious amounts of sterile saline.  The tissues were approximated and trimmed appropriately. The maxillary left surgical site was then closed from the distal of #13 and extended the mesial #9 utilizing 3-0 chromic gut suture in a continuous interrupted suture technique 1. The maxillary right surgical site was then closed from the distal of # 3 and extended the mesial #8 utilizing 3-0 chromic gut suture in a continuous interrupted suture technique 1.   At this point time, the mandibular quadrants were approached. The patient was given bilateral inferior alveolar nerve blocks and long buccal nerve blocks utilizing the bupivacaine with epinephrine. Further infiltration was then achieved utilizing the lidocaine with epinephrine. A 15 blade incision was then made from the distal of number #19 and extended to the distal of #31.  A surgical flap was then carefully reflected. Appropriate amounts of buccal and interseptal bone were then removed utilizing a surgical handpiece and copious amount of sterile water. The lower teeth were then subluxated with a series straight elevators. Tooth numbers 20, 21, 22, 23, 24, 27, 28 then removed with a 151 forceps without complications. The coronal aspect of tooth #29 was removed at this time leaving the root remaining. Tooth #30 was then removed with a 23 forceps without complications. Further bone was then removed around retained root #29 and this was elevated out with a cryers elevator. Alveoloplasty was then performed utilizing a rongeurs and bone file. The tissues were approximated and trimmed appropriately. The surgical sites were then irrigated with copious amounts sterile saline. The mandibular left surgical site was then closed from the distal of #19 and extended the mesial of #24 utilizing 3-0 chromic gut suture in a continuous to suture technique 1. The mandibular right surgical site was then closed from the distal of 31 and extended the mesial of #25 utilizing 3-0 chromic gut suture in a continuous  running suture technique 1. 2 interrupted sutures were then placed with 3-0 chromic gut material to further close the surgical site. 2 additional interrupted sutures are then placed to further closed surgical site utilizing 4-0 chromic gut material as well.  At this point time, the entire mouth was irrigated with copious amounts of sterile saline. The patient was examined for complications, seeing none, the dental medicine procedure was deemed to be complete. The throat pack was removed at this time. An oral airway was then placed at the request of the anesthesia team. A series of 4 x 4 gauze were placed in the mouth to aid hemostasis. The patient was then handed over to the anesthesia team for final disposition. After an appropriate amount of time, the patient was extubated and taken to the postanesthsia care unit in good condition.  All counts were correct for the dental medicine procedure. The patient will return to dental medicine for evaluation for suture removal in 7-10 days. The patient is to start chemotherapy on Monday at the discretion of Dr. Julien Nordmann.   Lenn Cal, DDS.

## 2014-08-22 ENCOUNTER — Telehealth: Payer: Self-pay | Admitting: *Deleted

## 2014-08-22 ENCOUNTER — Encounter (HOSPITAL_COMMUNITY): Payer: Self-pay | Admitting: Dentistry

## 2014-08-22 ENCOUNTER — Other Ambulatory Visit: Payer: Medicare Other

## 2014-08-22 NOTE — Telephone Encounter (Signed)
Before first dose of chemo

## 2014-08-22 NOTE — Anesthesia Postprocedure Evaluation (Signed)
Anesthesia Post Note  Patient: Jeffrey Hines  Procedure(s) Performed: Procedure(s) (LRB): extraction of tooth #'s 6,8,9,11,20,21,22,23,24,27,28,29, and 30 with alveoloplasty (N/A)  Anesthesia type: general  Patient location: PACU  Post pain: Pain level controlled  Post assessment: Patient's Cardiovascular Status Stable  Last Vitals:  Filed Vitals:   08/21/14 1330  BP: 139/87  Pulse:   Temp:   Resp:     Post vital signs: Reviewed and stable  Level of consciousness: sedated  Complications: No apparent anesthesia complications

## 2014-08-22 NOTE — Telephone Encounter (Signed)
Jeffrey Hines,Wendy, Who r/s this pt for your class?

## 2014-08-22 NOTE — Telephone Encounter (Signed)
Spouse [name] called reporting [facility] [employer] was to fax cancellation for today's education class. This nurse asked if he can come in later today as he is scheduled to start treatment 08-25-2014 with lab at 0800/tteatment at 8:30.  "We'll be there on Monday and schedule.  He had all his teeth extracted yesterday and he can't come in today."  Will notify education staff nurse and Dr. Julien Nordmann.

## 2014-08-25 ENCOUNTER — Encounter: Payer: Self-pay | Admitting: General Practice

## 2014-08-25 ENCOUNTER — Ambulatory Visit (HOSPITAL_BASED_OUTPATIENT_CLINIC_OR_DEPARTMENT_OTHER): Payer: Medicare Other

## 2014-08-25 ENCOUNTER — Ambulatory Visit
Admission: RE | Admit: 2014-08-25 | Discharge: 2014-08-25 | Disposition: A | Discharge status: Home / Self Care | Payer: Medicare Other | Source: Ambulatory Visit | Attending: Radiation Oncology | Admitting: Radiation Oncology

## 2014-08-25 ENCOUNTER — Other Ambulatory Visit (HOSPITAL_BASED_OUTPATIENT_CLINIC_OR_DEPARTMENT_OTHER): Payer: Medicare Other

## 2014-08-25 DIAGNOSIS — C3412 Malignant neoplasm of upper lobe, left bronchus or lung: Secondary | ICD-10-CM | POA: Diagnosis present

## 2014-08-25 DIAGNOSIS — Z5111 Encounter for antineoplastic chemotherapy: Secondary | ICD-10-CM | POA: Diagnosis present

## 2014-08-25 DIAGNOSIS — Z79891 Long term (current) use of opiate analgesic: Secondary | ICD-10-CM | POA: Diagnosis not present

## 2014-08-25 DIAGNOSIS — C3492 Malignant neoplasm of unspecified part of left bronchus or lung: Secondary | ICD-10-CM

## 2014-08-25 DIAGNOSIS — Z510 Encounter for antineoplastic radiation therapy: Secondary | ICD-10-CM | POA: Diagnosis not present

## 2014-08-25 DIAGNOSIS — L598 Other specified disorders of the skin and subcutaneous tissue related to radiation: Secondary | ICD-10-CM | POA: Diagnosis not present

## 2014-08-25 DIAGNOSIS — Z8701 Personal history of pneumonia (recurrent): Secondary | ICD-10-CM | POA: Diagnosis not present

## 2014-08-25 DIAGNOSIS — R52 Pain, unspecified: Secondary | ICD-10-CM | POA: Diagnosis not present

## 2014-08-25 LAB — CBC WITH DIFFERENTIAL/PLATELET
BASO%: 0.4 % (ref 0.0–2.0)
Basophils Absolute: 0 10*3/uL (ref 0.0–0.1)
EOS%: 8.1 % — ABNORMAL HIGH (ref 0.0–7.0)
Eosinophils Absolute: 0.6 10*3/uL — ABNORMAL HIGH (ref 0.0–0.5)
HCT: 39.2 % (ref 38.4–49.9)
HGB: 13.3 g/dL (ref 13.0–17.1)
LYMPH%: 19.2 % (ref 14.0–49.0)
MCH: 29.9 pg (ref 27.2–33.4)
MCHC: 33.9 g/dL (ref 32.0–36.0)
MCV: 88.1 fL (ref 79.3–98.0)
MONO#: 0.7 10*3/uL (ref 0.1–0.9)
MONO%: 10.3 % (ref 0.0–14.0)
NEUT%: 62 % (ref 39.0–75.0)
NEUTROS ABS: 4.4 10*3/uL (ref 1.5–6.5)
PLATELETS: 218 10*3/uL (ref 140–400)
RBC: 4.45 10*6/uL (ref 4.20–5.82)
RDW: 12.9 % (ref 11.0–14.6)
WBC: 7 10*3/uL (ref 4.0–10.3)
lymph#: 1.4 10*3/uL (ref 0.9–3.3)

## 2014-08-25 LAB — COMPREHENSIVE METABOLIC PANEL (CC13)
ALBUMIN: 3.6 g/dL (ref 3.5–5.0)
ALK PHOS: 65 U/L (ref 40–150)
ALT: 21 U/L (ref 0–55)
AST: 19 U/L (ref 5–34)
Anion Gap: 9 mEq/L (ref 3–11)
BUN: 19.4 mg/dL (ref 7.0–26.0)
CO2: 25 meq/L (ref 22–29)
Calcium: 9.1 mg/dL (ref 8.4–10.4)
Chloride: 105 mEq/L (ref 98–109)
Creatinine: 0.8 mg/dL (ref 0.7–1.3)
GLUCOSE: 96 mg/dL (ref 70–140)
POTASSIUM: 4.5 meq/L (ref 3.5–5.1)
SODIUM: 139 meq/L (ref 136–145)
TOTAL PROTEIN: 7.1 g/dL (ref 6.4–8.3)
Total Bilirubin: 0.63 mg/dL (ref 0.20–1.20)

## 2014-08-25 MED ORDER — PACLITAXEL CHEMO INJECTION 300 MG/50ML
45.0000 mg/m2 | Freq: Once | INTRAVENOUS | Status: AC
  Administered 2014-08-25: 90 mg via INTRAVENOUS
  Filled 2014-08-25: qty 15

## 2014-08-25 MED ORDER — SODIUM CHLORIDE 0.9 % IV SOLN
Freq: Once | INTRAVENOUS | Status: AC
  Administered 2014-08-25: 09:00:00 via INTRAVENOUS
  Filled 2014-08-25: qty 8

## 2014-08-25 MED ORDER — SODIUM CHLORIDE 0.9 % IV SOLN
Freq: Once | INTRAVENOUS | Status: AC
  Administered 2014-08-25: 09:00:00 via INTRAVENOUS

## 2014-08-25 MED ORDER — DIPHENHYDRAMINE HCL 50 MG/ML IJ SOLN
50.0000 mg | Freq: Once | INTRAMUSCULAR | Status: AC
  Administered 2014-08-25: 50 mg via INTRAVENOUS

## 2014-08-25 MED ORDER — DIPHENHYDRAMINE HCL 50 MG/ML IJ SOLN
INTRAMUSCULAR | Status: AC
  Filled 2014-08-25: qty 1

## 2014-08-25 MED ORDER — SODIUM CHLORIDE 0.9 % IV SOLN
215.6000 mg | Freq: Once | INTRAVENOUS | Status: AC
  Administered 2014-08-25: 220 mg via INTRAVENOUS
  Filled 2014-08-25: qty 22

## 2014-08-25 MED ORDER — FAMOTIDINE IN NACL 20-0.9 MG/50ML-% IV SOLN
INTRAVENOUS | Status: AC
  Filled 2014-08-25: qty 50

## 2014-08-25 MED ORDER — FAMOTIDINE IN NACL 20-0.9 MG/50ML-% IV SOLN
20.0000 mg | Freq: Once | INTRAVENOUS | Status: AC
  Administered 2014-08-25: 20 mg via INTRAVENOUS

## 2014-08-25 NOTE — Progress Notes (Signed)
Referred by Royston Sinner, RN for caregiver support in infusion room.  Met Jeffrey Hines and his wife Jeffrey Hines, who is a Engineer, water and has particular interest in meditation and mindfulness, both as personal practices and as aids to immune system/healing.  Provided pastoral presence, reflective listening, encouragement, and introduction to [place] resources/programming.  Family appreciative and aware of ongoing chaplain availability for support.  [place], [place]

## 2014-08-25 NOTE — Patient Instructions (Signed)
Carboplatin injection  What is this medicine?  CARBOPLATIN (KAR boe pla tin) is a chemotherapy drug. It targets fast dividing cells, like cancer cells, and causes these cells to die. This medicine is used to treat ovarian cancer and many other cancers.  This medicine may be used for other purposes; ask your health care provider or pharmacist if you have questions.  COMMON BRAND NAME(S): Paraplatin  What should I tell my health care provider before I take this medicine?  They need to know if you have any of these conditions:  -blood disorders  -hearing problems  -kidney disease  -recent or ongoing radiation therapy  -an unusual or allergic reaction to carboplatin, cisplatin, other chemotherapy, other medicines, foods, dyes, or preservatives  -pregnant or trying to get pregnant  -breast-feeding  How should I use this medicine?  This drug is usually given as an infusion into a vein. It is administered in a hospital or clinic by a specially trained health care professional.  Talk to your pediatrician regarding the use of this medicine in children. Special care may be needed.  Overdosage: If you think you have taken too much of this medicine contact a poison control center or emergency room at once.  NOTE: This medicine is only for you. Do not share this medicine with others.  What if I miss a dose?  It is important not to miss a dose. Call your doctor or health care professional if you are unable to keep an appointment.  What may interact with this medicine?  -medicines for seizures  -medicines to increase blood counts like filgrastim, pegfilgrastim, sargramostim  -some antibiotics like amikacin, gentamicin, neomycin, streptomycin, tobramycin  -vaccines  Talk to your doctor or health care professional before taking any of these medicines:  -acetaminophen  -aspirin  -ibuprofen  -ketoprofen  -naproxen  This list may not describe all possible interactions. Give your health care provider a list of all the medicines, herbs,  non-prescription drugs, or dietary supplements you use. Also tell them if you smoke, drink alcohol, or use illegal drugs. Some items may interact with your medicine.  What should I watch for while using this medicine?  Your condition will be monitored carefully while you are receiving this medicine. You will need important blood work done while you are taking this medicine.  This drug may make you feel generally unwell. This is not uncommon, as chemotherapy can affect healthy cells as well as cancer cells. Report any side effects. Continue your course of treatment even though you feel ill unless your doctor tells you to stop.  In some cases, you may be given additional medicines to help with side effects. Follow all directions for their use.  Call your doctor or health care professional for advice if you get a fever, chills or sore throat, or other symptoms of a cold or flu. Do not treat yourself. This drug decreases your body's ability to fight infections. Try to avoid being around people who are sick.  This medicine may increase your risk to bruise or bleed. Call your doctor or health care professional if you notice any unusual bleeding.  Be careful brushing and flossing your teeth or using a toothpick because you may get an infection or bleed more easily. If you have any dental work done, tell your dentist you are receiving this medicine.  Avoid taking products that contain aspirin, acetaminophen, ibuprofen, naproxen, or ketoprofen unless instructed by your doctor. These medicines may hide a fever.  Do not become   pregnant while taking this medicine. Women should inform their doctor if they wish to become pregnant or think they might be pregnant. There is a potential for serious side effects to an unborn child. Talk to your health care professional or pharmacist for more information. Do not breast-feed an infant while taking this medicine.  What side effects may I notice from receiving this medicine?  Side effects  that you should report to your doctor or health care professional as soon as possible:  -allergic reactions like skin rash, itching or hives, swelling of the face, lips, or tongue  -signs of infection - fever or chills, cough, sore throat, pain or difficulty passing urine  -signs of decreased platelets or bleeding - bruising, pinpoint red spots on the skin, black, tarry stools, nosebleeds  -signs of decreased red blood cells - unusually weak or tired, fainting spells, lightheadedness  -breathing problems  -changes in hearing  -changes in vision  -chest pain  -high blood pressure  -low blood counts - This drug may decrease the number of white blood cells, red blood cells and platelets. You may be at increased risk for infections and bleeding.  -nausea and vomiting  -pain, swelling, redness or irritation at the injection site  -pain, tingling, numbness in the hands or feet  -problems with balance, talking, walking  -trouble passing urine or change in the amount of urine  Side effects that usually do not require medical attention (report to your doctor or health care professional if they continue or are bothersome):  -hair loss  -loss of appetite  -metallic taste in the mouth or changes in taste  This list may not describe all possible side effects. Call your doctor for medical advice about side effects. You may report side effects to FDA at 1-800-FDA-1088.  Where should I keep my medicine?  This drug is given in a hospital or clinic and will not be stored at home.  NOTE: This sheet is a summary. It may not cover all possible information. If you have questions about this medicine, talk to your doctor, pharmacist, or health care provider.   2015, Elsevier/Gold Standard. (2007-08-28 14:38:05)  Paclitaxel injection  What is this medicine?  PACLITAXEL (PAK li TAX el) is a chemotherapy drug. It targets fast dividing cells, like cancer cells, and causes these cells to die. This medicine is used to treat ovarian cancer,  breast cancer, and other cancers.  This medicine may be used for other purposes; ask your health care provider or pharmacist if you have questions.  COMMON BRAND NAME(S): Onxol, Taxol  What should I tell my health care provider before I take this medicine?  They need to know if you have any of these conditions:  -blood disorders  -irregular heartbeat  -infection (especially a virus infection such as chickenpox, cold sores, or herpes)  -liver disease  -previous or ongoing radiation therapy  -an unusual or allergic reaction to paclitaxel, alcohol, polyoxyethylated castor oil, other chemotherapy agents, other medicines, foods, dyes, or preservatives  -pregnant or trying to get pregnant  -breast-feeding  How should I use this medicine?  This drug is given as an infusion into a vein. It is administered in a hospital or clinic by a specially trained health care professional.  Talk to your pediatrician regarding the use of this medicine in children. Special care may be needed.  Overdosage: If you think you have taken too much of this medicine contact a poison control center or emergency room at once.  NOTE:   This medicine is only for you. Do not share this medicine with others.  What if I miss a dose?  It is important not to miss your dose. Call your doctor or health care professional if you are unable to keep an appointment.  What may interact with this medicine?  Do not take this medicine with any of the following medications:  -disulfiram  -metronidazole  This medicine may also interact with the following medications:  -cyclosporine  -diazepam  -ketoconazole  -medicines to increase blood counts like filgrastim, pegfilgrastim, sargramostim  -other chemotherapy drugs like cisplatin, doxorubicin, epirubicin, etoposide, teniposide, vincristine  -quinidine  -testosterone  -vaccines  -verapamil  Talk to your doctor or health care professional before taking any of these  medicines:  -acetaminophen  -aspirin  -ibuprofen  -ketoprofen  -naproxen  This list may not describe all possible interactions. Give your health care provider a list of all the medicines, herbs, non-prescription drugs, or dietary supplements you use. Also tell them if you smoke, drink alcohol, or use illegal drugs. Some items may interact with your medicine.  What should I watch for while using this medicine?  Your condition will be monitored carefully while you are receiving this medicine. You will need important blood work done while you are taking this medicine.  This drug may make you feel generally unwell. This is not uncommon, as chemotherapy can affect healthy cells as well as cancer cells. Report any side effects. Continue your course of treatment even though you feel ill unless your doctor tells you to stop.  In some cases, you may be given additional medicines to help with side effects. Follow all directions for their use.  Call your doctor or health care professional for advice if you get a fever, chills or sore throat, or other symptoms of a cold or flu. Do not treat yourself. This drug decreases your body's ability to fight infections. Try to avoid being around people who are sick.  This medicine may increase your risk to bruise or bleed. Call your doctor or health care professional if you notice any unusual bleeding.  Be careful brushing and flossing your teeth or using a toothpick because you may get an infection or bleed more easily. If you have any dental work done, tell your dentist you are receiving this medicine.  Avoid taking products that contain aspirin, acetaminophen, ibuprofen, naproxen, or ketoprofen unless instructed by your doctor. These medicines may hide a fever.  Do not become pregnant while taking this medicine. Women should inform their doctor if they wish to become pregnant or think they might be pregnant. There is a potential for serious side effects to an unborn child. Talk to  your health care professional or pharmacist for more information. Do not breast-feed an infant while taking this medicine.  Men are advised not to father a child while receiving this medicine.  What side effects may I notice from receiving this medicine?  Side effects that you should report to your doctor or health care professional as soon as possible:  -allergic reactions like skin rash, itching or hives, swelling of the face, lips, or tongue  -low blood counts - This drug may decrease the number of white blood cells, red blood cells and platelets. You may be at increased risk for infections and bleeding.  -signs of infection - fever or chills, cough, sore throat, pain or difficulty passing urine  -signs of decreased platelets or bleeding - bruising, pinpoint red spots on the skin, black, tarry   stools, nosebleeds  -signs of decreased red blood cells - unusually weak or tired, fainting spells, lightheadedness  -breathing problems  -chest pain  -high or low blood pressure  -mouth sores  -nausea and vomiting  -pain, swelling, redness or irritation at the injection site  -pain, tingling, numbness in the hands or feet  -slow or irregular heartbeat  -swelling of the ankle, feet, hands  Side effects that usually do not require medical attention (report to your doctor or health care professional if they continue or are bothersome):  -bone pain  -complete hair loss including hair on your head, underarms, pubic hair, eyebrows, and eyelashes  -changes in the color of fingernails  -diarrhea  -loosening of the fingernails  -loss of appetite  -muscle or joint pain  -red flush to skin  -sweating  This list may not describe all possible side effects. Call your doctor for medical advice about side effects. You may report side effects to FDA at 1-800-FDA-1088.  Where should I keep my medicine?  This drug is given in a hospital or clinic and will not be stored at home.  NOTE: This sheet is a summary. It may not cover all possible  information. If you have questions about this medicine, talk to your doctor, pharmacist, or health care provider.   2015, Elsevier/Gold Standard. (2012-07-16 16:41:21)

## 2014-08-26 ENCOUNTER — Telehealth: Payer: Self-pay | Admitting: Medical Oncology

## 2014-08-26 ENCOUNTER — Encounter: Payer: Self-pay | Admitting: Radiation Oncology

## 2014-08-26 ENCOUNTER — Ambulatory Visit
Admission: RE | Admit: 2014-08-26 | Discharge: 2014-08-26 | Disposition: A | Discharge status: Home / Self Care | Payer: Medicare Other | Source: Ambulatory Visit | Attending: Radiation Oncology | Admitting: Radiation Oncology

## 2014-08-26 DIAGNOSIS — R52 Pain, unspecified: Secondary | ICD-10-CM | POA: Diagnosis not present

## 2014-08-26 DIAGNOSIS — Z8701 Personal history of pneumonia (recurrent): Secondary | ICD-10-CM | POA: Diagnosis not present

## 2014-08-26 DIAGNOSIS — Z510 Encounter for antineoplastic radiation therapy: Secondary | ICD-10-CM | POA: Diagnosis not present

## 2014-08-26 DIAGNOSIS — C3412 Malignant neoplasm of upper lobe, left bronchus or lung: Secondary | ICD-10-CM | POA: Diagnosis not present

## 2014-08-26 DIAGNOSIS — L598 Other specified disorders of the skin and subcutaneous tissue related to radiation: Secondary | ICD-10-CM | POA: Diagnosis not present

## 2014-08-26 DIAGNOSIS — C3492 Malignant neoplasm of unspecified part of left bronchus or lung: Secondary | ICD-10-CM | POA: Insufficient documentation

## 2014-08-26 DIAGNOSIS — Z79891 Long term (current) use of opiate analgesic: Secondary | ICD-10-CM | POA: Diagnosis not present

## 2014-08-26 MED ORDER — RADIAPLEXRX EX GEL
Freq: Once | CUTANEOUS | Status: AC
  Administered 2014-08-26: 14:00:00 via TOPICAL

## 2014-08-26 NOTE — Telephone Encounter (Signed)
-----   Message from Randolm Idol, [place] sent at 08/25/2014  9:27 AM EDT ----- Regarding: 1st carbo/taxol Dr Julien Nordmann

## 2014-08-26 NOTE — Telephone Encounter (Signed)
In checking my folder I notice this gentleman was not scheduled for class yesterday and has not received chemo education class.  Do you want him to attend a class?  Send P.O.F. For scheduling to re-schedule education if needed.Marland Kitchen

## 2014-08-26 NOTE — Telephone Encounter (Signed)
I left a message for pt to return my call.  

## 2014-08-26 NOTE — Progress Notes (Signed)
Patient denies pain, loss of appetite, fatigue, SOB beyond his baseline, cough. He is on continuous O2 @/min.  BP 130/77 mmHg  Pulse 79  Temp(Src) 97.8 F (36.6 C)  Resp 20  SpO2 96%  Patient education completed with patient and wife today. Gave him "Radiation Therapy and You" booklet with all pertinent pages marked and discussed, VX:[id number], skin irritation/care, throat irritation/care, nutrition, pain. Gave pt Radiaplex with instructions for proper use. Teach back method used. Pt verbalized understanding.

## 2014-08-26 NOTE — Telephone Encounter (Signed)
He will need a chemotherapy education class before the first cycle of chemotherapy

## 2014-08-26 NOTE — Telephone Encounter (Signed)
Pt called back and said he is doing well.A "little grumpy" -slept well . Denies nausea, vomiting. On his way to radiation.

## 2014-08-26 NOTE — Progress Notes (Signed)
  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 . . . . . . . The patient presents for routine under treatment assessment.                                   The patient is without complaint. He continues on 2 L of oxygen                                 Set-up films were reviewed.                                 The chart was checked. Physical Findings. . .  temperature is 97.8 F (36.6 C). His blood pressure is 130/77 and his pulse is 79. His respiration is 20 and oxygen saturation is 96%. . The lungs are clear. The heart has a regular rhythm and rate. Impression . . . . . . . The patient is tolerating radiation. Plan . . . . . . . . . . . . Continue treatment as planned.  ________________________________   Blair Promise, PhD, MD

## 2014-08-27 ENCOUNTER — Ambulatory Visit
Admission: RE | Admit: 2014-08-27 | Discharge: 2014-08-27 | Disposition: A | Discharge status: Home / Self Care | Payer: Medicare Other | Source: Ambulatory Visit | Attending: Radiation Oncology | Admitting: Radiation Oncology

## 2014-08-27 VITALS — BP 106/68 | HR 99 | Temp 98.8°F | Resp 16

## 2014-08-27 DIAGNOSIS — L598 Other specified disorders of the skin and subcutaneous tissue related to radiation: Secondary | ICD-10-CM | POA: Diagnosis not present

## 2014-08-27 DIAGNOSIS — C3492 Malignant neoplasm of unspecified part of left bronchus or lung: Secondary | ICD-10-CM | POA: Diagnosis not present

## 2014-08-27 DIAGNOSIS — Z79891 Long term (current) use of opiate analgesic: Secondary | ICD-10-CM | POA: Diagnosis not present

## 2014-08-27 DIAGNOSIS — Z510 Encounter for antineoplastic radiation therapy: Secondary | ICD-10-CM | POA: Diagnosis not present

## 2014-08-27 DIAGNOSIS — Z8701 Personal history of pneumonia (recurrent): Secondary | ICD-10-CM | POA: Diagnosis not present

## 2014-08-27 DIAGNOSIS — C3412 Malignant neoplasm of upper lobe, left bronchus or lung: Secondary | ICD-10-CM | POA: Diagnosis not present

## 2014-08-27 DIAGNOSIS — R52 Pain, unspecified: Secondary | ICD-10-CM | POA: Diagnosis not present

## 2014-08-27 MED ORDER — LEVOFLOXACIN 500 MG PO TABS: 500.0000 mg | ORAL_TABLET | Freq: Every day | ORAL | Status: DC

## 2014-08-27 NOTE — Progress Notes (Signed)
Jeffrey Hines has completed 3 fractions to his left chest.  He reports starting to feel achy and warm last night.  He reports it feels like when he has pneumonia in the past.  He reports starting to cough up green sputum yesterday.  He reports having a sinus headache.  He reports his temperature is usually 97 degrees and this morning was 99.  He has taken ibuprofen and hydrocodone/acetaminophen at 8:30 this morning.  BP 106/68 mmHg  Pulse 99  Temp(Src) 98.8 F (37.1 C) (Oral)  Resp 16  SpO2 97%

## 2014-08-27 NOTE — Progress Notes (Signed)
  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 . . . . . . . The patient presents for routine under treatment assessment.                                   The patient starting to feel achy and warm last night. He reports it feels like when he has pneumonia in the past. He reports starting to cough up green sputum yesterday. He reports having a sinus headache. He reports his temperature is usually 97 degrees and this morning was 99                                 Set-up films were reviewed.                                 The chart was checked. Physical Findings. . .  oral temperature is 98.8 F (37.1 C). His blood pressure is 106/68 and his pulse is 99. His respiration is 16 and oxygen saturation is 97%. . The lungs are clear. The heart has regular rhythm and rate. The patient is wearing supplemental oxygen at this time. Impression . . . . . . . The patient is tolerating radiation. Plan . . . . . . . . . . . . Continue treatment as planned. The patient is at high risk for postobstructive pneumonia and may be developing recurrent pneumonia at this time. He will be placed on Levaquin 500 mg for 7 day course.  ________________________________   Blair Promise, PhD, MD

## 2014-08-28 ENCOUNTER — Ambulatory Visit
Admission: RE | Admit: 2014-08-28 | Discharge: 2014-08-28 | Disposition: A | Discharge status: Home / Self Care | Payer: Medicare Other | Source: Ambulatory Visit | Attending: Radiation Oncology | Admitting: Radiation Oncology

## 2014-08-28 ENCOUNTER — Other Ambulatory Visit: Payer: Self-pay | Admitting: Physician Assistant

## 2014-08-28 DIAGNOSIS — Z510 Encounter for antineoplastic radiation therapy: Secondary | ICD-10-CM | POA: Diagnosis not present

## 2014-08-28 DIAGNOSIS — C3492 Malignant neoplasm of unspecified part of left bronchus or lung: Secondary | ICD-10-CM

## 2014-08-28 DIAGNOSIS — R52 Pain, unspecified: Secondary | ICD-10-CM | POA: Diagnosis not present

## 2014-08-28 DIAGNOSIS — L598 Other specified disorders of the skin and subcutaneous tissue related to radiation: Secondary | ICD-10-CM | POA: Diagnosis not present

## 2014-08-28 DIAGNOSIS — C3412 Malignant neoplasm of upper lobe, left bronchus or lung: Secondary | ICD-10-CM | POA: Diagnosis not present

## 2014-08-28 DIAGNOSIS — Z8701 Personal history of pneumonia (recurrent): Secondary | ICD-10-CM | POA: Diagnosis not present

## 2014-08-28 DIAGNOSIS — Z79891 Long term (current) use of opiate analgesic: Secondary | ICD-10-CM | POA: Diagnosis not present

## 2014-08-29 ENCOUNTER — Ambulatory Visit
Admission: RE | Admit: 2014-08-29 | Discharge: 2014-08-29 | Disposition: A | Discharge status: Home / Self Care | Payer: Medicare Other | Source: Ambulatory Visit | Attending: Radiation Oncology | Admitting: Radiation Oncology

## 2014-08-29 DIAGNOSIS — Z8701 Personal history of pneumonia (recurrent): Secondary | ICD-10-CM | POA: Diagnosis not present

## 2014-08-29 DIAGNOSIS — L598 Other specified disorders of the skin and subcutaneous tissue related to radiation: Secondary | ICD-10-CM | POA: Diagnosis not present

## 2014-08-29 DIAGNOSIS — Z79891 Long term (current) use of opiate analgesic: Secondary | ICD-10-CM | POA: Diagnosis not present

## 2014-08-29 DIAGNOSIS — R52 Pain, unspecified: Secondary | ICD-10-CM | POA: Diagnosis not present

## 2014-08-29 DIAGNOSIS — C3412 Malignant neoplasm of upper lobe, left bronchus or lung: Secondary | ICD-10-CM | POA: Diagnosis not present

## 2014-08-29 DIAGNOSIS — C3492 Malignant neoplasm of unspecified part of left bronchus or lung: Secondary | ICD-10-CM | POA: Diagnosis not present

## 2014-08-29 DIAGNOSIS — Z510 Encounter for antineoplastic radiation therapy: Secondary | ICD-10-CM | POA: Diagnosis not present

## 2014-09-01 ENCOUNTER — Encounter: Payer: Self-pay | Admitting: Physician Assistant

## 2014-09-01 ENCOUNTER — Other Ambulatory Visit: Payer: Self-pay | Admitting: Medical Oncology

## 2014-09-01 ENCOUNTER — Telehealth: Payer: Self-pay | Admitting: Physician Assistant

## 2014-09-01 ENCOUNTER — Telehealth: Payer: Self-pay | Admitting: *Deleted

## 2014-09-01 ENCOUNTER — Encounter: Payer: Self-pay | Admitting: *Deleted

## 2014-09-01 ENCOUNTER — Ambulatory Visit
Admission: RE | Admit: 2014-09-01 | Discharge: 2014-09-01 | Disposition: A | Discharge status: Home / Self Care | Payer: Medicare Other | Source: Ambulatory Visit | Attending: Radiation Oncology | Admitting: Radiation Oncology

## 2014-09-01 ENCOUNTER — Ambulatory Visit (HOSPITAL_BASED_OUTPATIENT_CLINIC_OR_DEPARTMENT_OTHER): Payer: Medicare Other

## 2014-09-01 ENCOUNTER — Other Ambulatory Visit (HOSPITAL_BASED_OUTPATIENT_CLINIC_OR_DEPARTMENT_OTHER): Payer: Medicare Other

## 2014-09-01 ENCOUNTER — Ambulatory Visit (HOSPITAL_BASED_OUTPATIENT_CLINIC_OR_DEPARTMENT_OTHER): Payer: Medicare Other | Admitting: Physician Assistant

## 2014-09-01 VITALS — BP 114/70 | HR 72 | Temp 97.7°F | Resp 18 | Ht 71.0 in | Wt 182.3 lb

## 2014-09-01 DIAGNOSIS — C3490 Malignant neoplasm of unspecified part of unspecified bronchus or lung: Secondary | ICD-10-CM

## 2014-09-01 DIAGNOSIS — C3492 Malignant neoplasm of unspecified part of left bronchus or lung: Secondary | ICD-10-CM

## 2014-09-01 DIAGNOSIS — Z5111 Encounter for antineoplastic chemotherapy: Secondary | ICD-10-CM

## 2014-09-01 DIAGNOSIS — Z79891 Long term (current) use of opiate analgesic: Secondary | ICD-10-CM | POA: Diagnosis not present

## 2014-09-01 DIAGNOSIS — C3412 Malignant neoplasm of upper lobe, left bronchus or lung: Secondary | ICD-10-CM

## 2014-09-01 DIAGNOSIS — Z8701 Personal history of pneumonia (recurrent): Secondary | ICD-10-CM | POA: Diagnosis not present

## 2014-09-01 DIAGNOSIS — L598 Other specified disorders of the skin and subcutaneous tissue related to radiation: Secondary | ICD-10-CM | POA: Diagnosis not present

## 2014-09-01 DIAGNOSIS — R52 Pain, unspecified: Secondary | ICD-10-CM | POA: Diagnosis not present

## 2014-09-01 DIAGNOSIS — Z510 Encounter for antineoplastic radiation therapy: Secondary | ICD-10-CM | POA: Diagnosis not present

## 2014-09-01 LAB — CBC WITH DIFFERENTIAL/PLATELET
BASO%: 0.2 % (ref 0.0–2.0)
Basophils Absolute: 0 10*3/uL (ref 0.0–0.1)
EOS%: 17.9 % — ABNORMAL HIGH (ref 0.0–7.0)
Eosinophils Absolute: 1 10*3/uL — ABNORMAL HIGH (ref 0.0–0.5)
HEMATOCRIT: 36.9 % — AB (ref 38.4–49.9)
HGB: 12.5 g/dL — ABNORMAL LOW (ref 13.0–17.1)
LYMPH%: 12.8 % — AB (ref 14.0–49.0)
MCH: 30 pg (ref 27.2–33.4)
MCHC: 33.9 g/dL (ref 32.0–36.0)
MCV: 88.5 fL (ref 79.3–98.0)
MONO#: 0.6 10*3/uL (ref 0.1–0.9)
MONO%: 10.9 % (ref 0.0–14.0)
NEUT%: 58.2 % (ref 39.0–75.0)
NEUTROS ABS: 3.2 10*3/uL (ref 1.5–6.5)
PLATELETS: 222 10*3/uL (ref 140–400)
RBC: 4.17 10*6/uL — ABNORMAL LOW (ref 4.20–5.82)
RDW: 12.8 % (ref 11.0–14.6)
WBC: 5.5 10*3/uL (ref 4.0–10.3)
lymph#: 0.7 10*3/uL — ABNORMAL LOW (ref 0.9–3.3)

## 2014-09-01 LAB — COMPREHENSIVE METABOLIC PANEL (CC13)
ALT: 20 U/L (ref 0–55)
ANION GAP: 8 meq/L (ref 3–11)
AST: 18 U/L (ref 5–34)
Albumin: 3.4 g/dL — ABNORMAL LOW (ref 3.5–5.0)
Alkaline Phosphatase: 65 U/L (ref 40–150)
BUN: 14 mg/dL (ref 7.0–26.0)
CALCIUM: 8.5 mg/dL (ref 8.4–10.4)
CHLORIDE: 110 meq/L — AB (ref 98–109)
CO2: 24 mEq/L (ref 22–29)
CREATININE: 0.8 mg/dL (ref 0.7–1.3)
EGFR: >90 mL/min/{1.73_m2} (ref >90)
Glucose: 97 mg/dl (ref 70–140)
Potassium: 4.3 mEq/L (ref 3.5–5.1)
Sodium: 141 mEq/L (ref 136–145)
Total Bilirubin: 0.32 mg/dL (ref 0.20–1.20)
Total Protein: 6.6 g/dL (ref 6.4–8.3)

## 2014-09-01 MED ORDER — SODIUM CHLORIDE 0.9 % IV SOLN
Freq: Once | INTRAVENOUS | Status: AC
  Administered 2014-09-01: 12:00:00 via INTRAVENOUS

## 2014-09-01 MED ORDER — DIPHENHYDRAMINE HCL 50 MG/ML IJ SOLN
INTRAMUSCULAR | Status: AC
  Filled 2014-09-01: qty 1

## 2014-09-01 MED ORDER — PACLITAXEL CHEMO INJECTION 300 MG/50ML
45.0000 mg/m2 | Freq: Once | INTRAVENOUS | Status: AC
  Administered 2014-09-01: 90 mg via INTRAVENOUS
  Filled 2014-09-01: qty 15

## 2014-09-01 MED ORDER — FAMOTIDINE IN NACL 20-0.9 MG/50ML-% IV SOLN
INTRAVENOUS | Status: AC
  Filled 2014-09-01: qty 50

## 2014-09-01 MED ORDER — DIPHENHYDRAMINE HCL 50 MG/ML IJ SOLN
50.0000 mg | Freq: Once | INTRAMUSCULAR | Status: AC
  Administered 2014-09-01: 50 mg via INTRAVENOUS

## 2014-09-01 MED ORDER — DEXAMETHASONE SODIUM PHOSPHATE 100 MG/10ML IJ SOLN
Freq: Once | INTRAMUSCULAR | Status: AC
Start: 2014-09-01 — End: 2014-09-01
  Administered 2014-09-01: 12:00:00 via INTRAVENOUS
  Filled 2014-09-01: qty 8

## 2014-09-01 MED ORDER — FAMOTIDINE IN NACL 20-0.9 MG/50ML-% IV SOLN
20.0000 mg | Freq: Once | INTRAVENOUS | Status: AC
  Administered 2014-09-01: 20 mg via INTRAVENOUS

## 2014-09-01 MED ORDER — SODIUM CHLORIDE 0.9 % IV SOLN
215.6000 mg | Freq: Once | INTRAVENOUS | Status: AC
  Administered 2014-09-01: 220 mg via INTRAVENOUS
  Filled 2014-09-01: qty 22

## 2014-09-01 NOTE — Telephone Encounter (Signed)
I have adjusted 4/11 appt

## 2014-09-01 NOTE — Progress Notes (Addendum)
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   By: Marcello Moores  Register   On: 08/06/2014 07:08   Dg Chest Port 1 View  08/05/2014   CLINICAL DATA:  History of pneumothorax  EXAM: PORTABLE CHEST - 1 VIEW  COMPARISON:  Portable chest x-ray of August 04, 2014  FINDINGS: The right lung is hyperinflated and clear. Mild shift of mediastinum toward the left is stable. The left lung demonstrates new increased density superiorly consistent with upper lobe collapse. There is no pneumothorax. The left-sided chest tube tip projects over the posterior aspect of the third rib and is stable. The cardiac silhouette and pulmonary vascularity are unremarkable. Multiple posterior rib deformities on the left are noted.  IMPRESSION: Progressive left upper lobe collapse. No pneumothorax is evident. The chest tube is unchanged in position.   Electronically Signed   By: David  Martinique   On: 08/05/2014 07:24   Dg Chest Port 1 View  08/04/2014   CLINICAL DATA:  Reassess pneumothorax  EXAM: PORTABLE CHEST -  1 VIEW  COMPARISON:  Portable chest x-ray of August 03, 2014  FINDINGS: There is persistent volume loss on the left with shift of the mediastinum from right to left. No pneumothorax is visible. Parenchymal consolidation in the left upper lobe is less conspicuous today. The  left-sided chest tube tip remains in the pulmonary apex. The right lung is hyperinflated and clear. The heart is normal in size. The pulmonary vascularity is not engorged.  IMPRESSION: No residual left-sided pneumothorax is demonstrated. There is no significant pleural effusion. Patchy upper lobe infiltrate has improved as well. There is compensatory hyperinflation of the right lung.   Electronically Signed   By: David  Martinique   On: 08/04/2014 08:38   Dg Chest Portable 1 View  08/03/2014   CLINICAL DATA:  Chest tube placement on left.  Pneumothorax.  EXAM: PORTABLE CHEST - 1 VIEW  COMPARISON:  Chest CT 08/03/2014  FINDINGS: Interval placement of left chest tube. Left upper airspace opacity noted, presumably consolidation. Right lung is clear. No visible effusions. No visible residual pneumothorax.  IMPRESSION: Re-expansion of the left lung following chest tube placement. Left upper lobe airspace consolidation.   Electronically Signed   By: Rolm Baptise M.D.   On: 08/03/2014 17:00     ASSESSMENT/PLAN:  No problem-specific assessment & plan notes found for this encounter.  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 He is currently undergoing a course of concurrent chemoradiation and thus far is tolerating this treatment without difficulty. Patient was discussed with and also seen by Dr. Julien Nordmann. He will continue with his course of concurrent chemoradiation. His last fraction of radiation is currently scheduled for 09/26/2014. He will continue with weekly labs and weekly chemotherapy concurrent with radiotherapy and follow-up in 2 weeks for another symptom management visit.  Awilda Metro E, PA-C 09/01/2014  All questions were answered. The patient knows to call the clinic with  any problems, questions or concerns. We can certainly see the patient much sooner if necessary.  ADDENDUM: Hematology/Oncology Attending: I had a face to face encounter with the patient. I recommended his care plan. This is a very pleasant 69 years old white male with stage IIb non-small cell lung cancer, unresectable is currently undergoing a course of concurrent chemoradiation with weekly carboplatin and paclitaxel status post 1 cycle and tolerated the first cycle of his treatment fairly well with no significant adverse effects. I recommended for the patient to proceed with cycle #2 today as a scheduled. The patient would Hines back for follow-up visit in 2 weeks for reevaluation and management any adverse effect of his treatment. He was advised to call immediately if he has any concerning symptoms in the interval.  Disclaimer: This note was dictated with voice recognition software. Similar sounding words can inadvertently be transcribed and may be missed upon review. Eilleen Kempf., MD 09/06/2014

## 2014-09-01 NOTE — Telephone Encounter (Signed)
Gave avs & calendar for [facility]. Sent message to adjustt treatment. No pof sent

## 2014-09-01 NOTE — Patient Instructions (Signed)
[facility] Discharge Instructions for Patients Receiving Chemotherapy  Today you received the following chemotherapy agents Taxol, Carbo  To help prevent nausea and vomiting after your treatment, we encourage you to take your nausea medication as directed/prescribed   If you develop nausea and vomiting that is not controlled by your nausea medication, call the clinic.   BELOW ARE SYMPTOMS THAT SHOULD BE REPORTED IMMEDIATELY:  *FEVER GREATER THAN 100.5 F  *CHILLS WITH OR WITHOUT FEVER  NAUSEA AND VOMITING THAT IS NOT CONTROLLED WITH YOUR NAUSEA MEDICATION  *UNUSUAL SHORTNESS OF BREATH  *UNUSUAL BRUISING OR BLEEDING  TENDERNESS IN MOUTH AND THROAT WITH OR WITHOUT PRESENCE OF ULCERS  *URINARY PROBLEMS  *BOWEL PROBLEMS  UNUSUAL RASH Items with * indicate a potential emergency and should be followed up as soon as possible.  Feel free to call the clinic you have any questions or concerns. The clinic phone number is [phone].  Please show the [place] at check-in to the Emergency Department and triage nurse.

## 2014-09-01 NOTE — [facility] Oncology Navigator Note (Signed)
Patient at [facility] for first chemotherapy treatment.  His is accompanied by his wife.  He reports that he is doing OK.  He is quiet and appears somewhat anxious.  His wife asked about his medical records being sent to the [facility] and reported that she made the request last week.  I checked with HIM at both [facility] and [facility] to see if records had been sent. Both departments reported that they had not received the request.  I completed a request for records form with patient and took to HIM.  Patient denied any other questions or concerns at this time.  I gave them my contact information and encouraged them to call for any needs.

## 2014-09-02 ENCOUNTER — Encounter (HOSPITAL_COMMUNITY): Payer: Self-pay | Admitting: Dentistry

## 2014-09-02 ENCOUNTER — Telehealth: Payer: Self-pay | Admitting: Internal Medicine

## 2014-09-02 ENCOUNTER — Ambulatory Visit
Admission: RE | Admit: 2014-09-02 | Discharge: 2014-09-02 | Disposition: A | Discharge status: Home / Self Care | Payer: Medicare Other | Source: Ambulatory Visit | Attending: Radiation Oncology | Admitting: Radiation Oncology

## 2014-09-02 ENCOUNTER — Ambulatory Visit (HOSPITAL_COMMUNITY): Payer: Self-pay | Admitting: Dentistry

## 2014-09-02 ENCOUNTER — Encounter: Payer: Self-pay | Admitting: Radiation Oncology

## 2014-09-02 ENCOUNTER — Ambulatory Visit
Admission: RE | Admit: 2014-09-02 | Discharge: 2014-09-02 | Disposition: A | Discharge status: Home / Self Care | Payer: Managed Care, Other (non HMO) | Source: Ambulatory Visit | Attending: Radiation Oncology | Admitting: Radiation Oncology

## 2014-09-02 VITALS — BP 130/67 | HR 99 | Temp 98.1°F

## 2014-09-02 VITALS — BP 119/73 | HR 93 | Temp 97.7°F | Resp 20 | Ht 71.0 in | Wt 183.6 lb

## 2014-09-02 DIAGNOSIS — K0820 Unspecified atrophy of edentulous alveolar ridge: Secondary | ICD-10-CM

## 2014-09-02 DIAGNOSIS — R52 Pain, unspecified: Secondary | ICD-10-CM | POA: Diagnosis not present

## 2014-09-02 DIAGNOSIS — K08109 Complete loss of teeth, unspecified cause, unspecified class: Secondary | ICD-10-CM

## 2014-09-02 DIAGNOSIS — Z510 Encounter for antineoplastic radiation therapy: Secondary | ICD-10-CM | POA: Diagnosis not present

## 2014-09-02 DIAGNOSIS — C3492 Malignant neoplasm of unspecified part of left bronchus or lung: Secondary | ICD-10-CM

## 2014-09-02 DIAGNOSIS — C3490 Malignant neoplasm of unspecified part of unspecified bronchus or lung: Secondary | ICD-10-CM

## 2014-09-02 DIAGNOSIS — Z79891 Long term (current) use of opiate analgesic: Secondary | ICD-10-CM | POA: Diagnosis not present

## 2014-09-02 DIAGNOSIS — Z8701 Personal history of pneumonia (recurrent): Secondary | ICD-10-CM | POA: Diagnosis not present

## 2014-09-02 DIAGNOSIS — C3412 Malignant neoplasm of upper lobe, left bronchus or lung: Secondary | ICD-10-CM | POA: Diagnosis not present

## 2014-09-02 DIAGNOSIS — L598 Other specified disorders of the skin and subcutaneous tissue related to radiation: Secondary | ICD-10-CM | POA: Diagnosis not present

## 2014-09-02 NOTE — Telephone Encounter (Signed)
Faxed pt medical records to [place] [phone]

## 2014-09-02 NOTE — Progress Notes (Signed)
Jeffrey Hines has completed 7 fractions to his left chest.  He denies pain.  He reports an occasional cough with clear sputum.  He has one day of Levaquin left.  He reports occasional small amount of hemoptysis but is not sure if it is coming from his nose.  He denies shortness of breath.  He is using 2 CL of oxygen via nasal canula.  He reports a good appetite.  He had chemotherapy yesterday and reports his left leg started twitching during the infusion.  It kept him awake last night. He denies fatigue.  His left upper back is red.  Recommended that he start using radiaplex twice a day.  BP 119/73 mmHg  Pulse 93  Temp(Src) 97.7 F (36.5 C) (Oral)  Resp 20  Ht 5\' 11"  (1.803 m)  Wt 183 lb 9.6 oz (83.28 kg)  BMI 25.62 kg/m2  SpO2 95%

## 2014-09-02 NOTE — Patient Instructions (Addendum)
MOUTH CARE AFTER SURGERY ° °FACTS: °· Ice used in ice bag helps keep the swelling down, and can help lessen the pain. °· It is easier to treat pain BEFORE it happens. °· Spitting disturbs the clot and may cause bleeding to start again, or to get worse. °· Smoking delays healing and can cause complications. °· Sharing prescriptions can be dangerous.  Do not take medications not recently prescribed for you. °· Antibiotics may stop birth control pills from working.  Use other means of birth control while on antibiotics. °· Warm salt water rinses after the first 24 hours will help lessen the swelling:  Use 1/2 teaspoonful of table salt per oz.of water. ° °DO NOT: °· Do not spit.  Do not drink through a straw. °· Strongly advised not to smoke, dip snuff or chew tobacco at least for 3 days. °· Do not eat sharp or crunchy foods.  Avoid the area of surgery when chewing. °· Do not stop your antibiotics before your instructions say to do so. °· Do not eat hot foods until bleeding has stopped.  If you need to, let your food cool down to room temperature. ° °EXPECT: °· Some swelling, especially first 2-3 days. °· Soreness or discomfort in varying degrees.  Follow your dentist's instructions about how to handle pain before it starts. °· Pinkish saliva or light blood in saliva, or on your pillow in the morning.  This can last around 24 hours. °· Bruising inside or outside the mouth.  This may not show up until 2-3 days after surgery.  Don't worry, it will go away in time. °· Pieces of "bone" may work themselves loose.  It's OK.  If they bother you, let us know. ° °WHAT TO DO IMMEDIATELY AFTER SURGERY: °· Bite on the gauze with steady pressure for 1-2 hours.  Don't chew on the gauze. °· Do not lie down flat.  Raise your head support especially for the first 24 hours. °· Apply ice to your face on the side of the surgery.  You may apply it 20 minutes on and a few minutes off.  Ice for 8-12 hours.  You may use ice up to 24  hours. °· Before the numbness wears off, take a pain pill as instructed. °· Prescription pain medication is not always required. ° °SWELLING: °· Expect swelling for the first couple of days.  It should get better after that. °· If swelling increases 3 days or so after surgery; let us know as soon as possible. ° °FEVER: °· Take Tylenol every 4 hours if needed to lower your temperature, especially if it is at 100F or higher. °· Drink lots of fluids. °· If the fever does not go away, let us know. ° °BREATHING TROUBLE: °· Any unusual difficulty breathing means you have to have someone bring you to the emergency room ASAP ° °BLEEDING: °· Light oozing is expected for 24 hours or so. °· Prop head up with pillows °· Avoid spitting °· Do not confuse bright red fresh flowing blood with lots of saliva colored with a little bit of blood. °· If you notice some bleeding, place gauze or a tea bag where it is bleeding and apply CONSTANT pressure by biting down for 1 hour.  Avoid talking during this time.  Do not remove the gauze or tea bag during this hour to "check" the bleeding. °· If you notice bright RED bleeding FLOWING out of particular area, and filling the floor of your mouth, put   a wad of gauze on that area, bite down firmly and constantly.  Call us immediately.  If we're closed, have someone bring you to the emergency room.  ORAL HYGIENE:  Brush your teeth as usual after meals and before bedtime.  Use a soft toothbrush around the area of surgery.  DO NOT AVOID BRUSHING.  Otherwise bacteria(germs) will grow and may delay healing or encourage infection.  Since you cannot spit, just gently rinse and let the water flow out of your mouth.  DO NOT SWISH HARD.  EATING:  Cool liquids are a good point to start.  Increase to soft foods as tolerated.  PRESCRIPTIONS:  Follow the directions for your prescriptions exactly as written.  If Dr. Enrique Sack gave you a narcotic pain medication, do not drive, operate  machinery or drink alcohol when on that medication.  QUESTIONS:  Call our office during office hours [phone] or call the Emergency Room at [phone].   PLAN: 1. Salt water rinses as needed to aid healing. 2. Advance diet as tolerated. 3. Follow-up for start of upper and lower complete dentures in three weeks.   Lenn Cal, DDS

## 2014-09-02 NOTE — Progress Notes (Signed)
POST OPERATIVE NOTE:  09/02/2014   Jeffrey Hines [id number]  VITALS: BP 130/67 mmHg  Pulse 99  Temp(Src) 98.1 F (36.7 C) (Oral)  LABS:  Lab Results  Component Value Date   WBC 5.5 09/01/2014   HGB 12.5* 09/01/2014   HCT 36.9* 09/01/2014   MCV 88.5 09/01/2014   PLT 222 09/01/2014   BMET    Component Value Date/Time   NA 141 09/01/2014 1048   NA 139 08/08/2014 0401   K 4.3 09/01/2014 1048   K 4.3 08/08/2014 0401   CL 101 08/08/2014 0401   CO2 24 09/01/2014 1048   CO2 33* 08/08/2014 0401   GLUCOSE 97 09/01/2014 1048   GLUCOSE 96 08/08/2014 0401   BUN 14.0 09/01/2014 1048   BUN 25* 08/08/2014 0401   CREATININE 0.8 09/01/2014 1048   CREATININE 0.87 08/08/2014 0401   CALCIUM 8.5 09/01/2014 1048   CALCIUM 8.6 08/08/2014 0401   GFRNONAA 87* 08/08/2014 0401   GFRAA >90 08/08/2014 0401    No results found for: INR, PROTIME No results found for: PTT   Jeffrey Hines is status post extraction of remaining teeth with alveoloplasty in the operating room with general anesthesia. The patient now presents for evaluation of healing and suture removal as needed.  SUBJECTIVE: The patient denies significant dental discomfort. Patient has a few stitches that remain.  EXAM: There is no sign of infection, heme, or ooze. Sutures are loosely intact. Generalized primary closure is noted. Very good healing is noted.  PROCEDURE: The patient was given a chlorhexidine gluconate rinse for 30 seconds. Sutures were then removed without complication. Patient tolerated the procedure well.  ASSESSMENT: Post operative course is consistent with dental procedures performed in the OR. The patient is now edentulous. There is atrophy of the edentulous alveolar ridges.  PLAN: 1. Salt water rinses as needed to aid healing. 2. Advance diet as tolerated. 3. Follow-up with dental medicine start of upper lower complete dentures in 3 weeks.   Lenn Cal, DDS

## 2014-09-02 NOTE — Progress Notes (Signed)
  Radiation Oncology         [phone] ________________________________  Name: Martavius Lusty MRN: [id number]  Date: 09/02/2014  DOB: 11-23-1945  Weekly Radiation Therapy Management  DIAGNOSIS: Stage II-B (T3, N0, M0) squamous cell carcinoma of the left upper central lung  Current Dose: 12.6 Gy     Planned Dose:  45 Gy  Narrative . . . . . . . . The patient presents for routine under treatment assessment.                                   The patient is without complaint. Denies any swallowing difficulties. He does feel better after being on antibiotics over the past few days.                                 Set-up films were reviewed.                                 The chart was checked. Physical Findings. . .  height is 5\' 11"  (1.803 m) and weight is 183 lb 9.6 oz (83.28 kg). His oral temperature is 97.7 F (36.5 C). His blood pressure is 119/73 and his pulse is 93. His respiration is 20 and oxygen saturation is 95%. . The lungs are clear. The heart has a regular rhythm and rate. The patient continues on supplemental oxygen. Impression . . . . . . . The patient is tolerating radiation. Plan . . . . . . . . . . . . Continue treatment as planned.  ________________________________   Blair Promise, PhD, MD

## 2014-09-03 ENCOUNTER — Ambulatory Visit
Admission: RE | Admit: 2014-09-03 | Discharge: 2014-09-03 | Disposition: A | Discharge status: Home / Self Care | Payer: Medicare Other | Source: Ambulatory Visit | Attending: Radiation Oncology | Admitting: Radiation Oncology

## 2014-09-03 DIAGNOSIS — L598 Other specified disorders of the skin and subcutaneous tissue related to radiation: Secondary | ICD-10-CM | POA: Diagnosis not present

## 2014-09-03 DIAGNOSIS — C3492 Malignant neoplasm of unspecified part of left bronchus or lung: Secondary | ICD-10-CM | POA: Diagnosis not present

## 2014-09-03 DIAGNOSIS — C3412 Malignant neoplasm of upper lobe, left bronchus or lung: Secondary | ICD-10-CM | POA: Diagnosis not present

## 2014-09-03 DIAGNOSIS — Z8701 Personal history of pneumonia (recurrent): Secondary | ICD-10-CM | POA: Diagnosis not present

## 2014-09-03 DIAGNOSIS — Z79891 Long term (current) use of opiate analgesic: Secondary | ICD-10-CM | POA: Diagnosis not present

## 2014-09-03 DIAGNOSIS — R52 Pain, unspecified: Secondary | ICD-10-CM | POA: Diagnosis not present

## 2014-09-03 DIAGNOSIS — Z510 Encounter for antineoplastic radiation therapy: Secondary | ICD-10-CM | POA: Diagnosis not present

## 2014-09-04 ENCOUNTER — Ambulatory Visit
Admission: RE | Admit: 2014-09-04 | Discharge: 2014-09-04 | Disposition: A | Discharge status: Home / Self Care | Payer: Medicare Other | Source: Ambulatory Visit | Attending: Radiation Oncology | Admitting: Radiation Oncology

## 2014-09-04 DIAGNOSIS — J449 Chronic obstructive pulmonary disease, unspecified: Secondary | ICD-10-CM | POA: Diagnosis not present

## 2014-09-04 DIAGNOSIS — E8809 Other disorders of plasma-protein metabolism, not elsewhere classified: Secondary | ICD-10-CM | POA: Diagnosis not present

## 2014-09-04 DIAGNOSIS — L598 Other specified disorders of the skin and subcutaneous tissue related to radiation: Secondary | ICD-10-CM | POA: Diagnosis not present

## 2014-09-04 DIAGNOSIS — C3492 Malignant neoplasm of unspecified part of left bronchus or lung: Secondary | ICD-10-CM | POA: Diagnosis not present

## 2014-09-04 DIAGNOSIS — C3412 Malignant neoplasm of upper lobe, left bronchus or lung: Secondary | ICD-10-CM | POA: Diagnosis not present

## 2014-09-04 DIAGNOSIS — Z79891 Long term (current) use of opiate analgesic: Secondary | ICD-10-CM | POA: Diagnosis not present

## 2014-09-04 DIAGNOSIS — F419 Anxiety disorder, unspecified: Secondary | ICD-10-CM | POA: Diagnosis not present

## 2014-09-04 DIAGNOSIS — I10 Essential (primary) hypertension: Secondary | ICD-10-CM | POA: Diagnosis not present

## 2014-09-04 DIAGNOSIS — Z510 Encounter for antineoplastic radiation therapy: Secondary | ICD-10-CM | POA: Diagnosis not present

## 2014-09-04 DIAGNOSIS — R52 Pain, unspecified: Secondary | ICD-10-CM | POA: Diagnosis not present

## 2014-09-04 DIAGNOSIS — Z8701 Personal history of pneumonia (recurrent): Secondary | ICD-10-CM | POA: Diagnosis not present

## 2014-09-05 ENCOUNTER — Ambulatory Visit
Admission: RE | Admit: 2014-09-05 | Discharge: 2014-09-05 | Disposition: A | Discharge status: Home / Self Care | Payer: Medicare Other | Source: Ambulatory Visit | Attending: Radiation Oncology | Admitting: Radiation Oncology

## 2014-09-05 DIAGNOSIS — Z79891 Long term (current) use of opiate analgesic: Secondary | ICD-10-CM | POA: Diagnosis not present

## 2014-09-05 DIAGNOSIS — C3412 Malignant neoplasm of upper lobe, left bronchus or lung: Secondary | ICD-10-CM | POA: Diagnosis not present

## 2014-09-05 DIAGNOSIS — Z510 Encounter for antineoplastic radiation therapy: Secondary | ICD-10-CM | POA: Diagnosis not present

## 2014-09-05 DIAGNOSIS — Z8701 Personal history of pneumonia (recurrent): Secondary | ICD-10-CM | POA: Diagnosis not present

## 2014-09-05 DIAGNOSIS — L598 Other specified disorders of the skin and subcutaneous tissue related to radiation: Secondary | ICD-10-CM | POA: Diagnosis not present

## 2014-09-05 DIAGNOSIS — R52 Pain, unspecified: Secondary | ICD-10-CM | POA: Diagnosis not present

## 2014-09-05 DIAGNOSIS — C3492 Malignant neoplasm of unspecified part of left bronchus or lung: Secondary | ICD-10-CM | POA: Diagnosis not present

## 2014-09-05 NOTE — Patient Instructions (Signed)
Continue your course of concurrent chemoradiation as scheduled Follow-up in 2 weeks

## 2014-09-08 ENCOUNTER — Ambulatory Visit
Admission: RE | Admit: 2014-09-08 | Discharge: 2014-09-08 | Disposition: A | Discharge status: Home / Self Care | Payer: Medicare Other | Source: Ambulatory Visit | Attending: Radiation Oncology | Admitting: Radiation Oncology

## 2014-09-08 ENCOUNTER — Ambulatory Visit (HOSPITAL_BASED_OUTPATIENT_CLINIC_OR_DEPARTMENT_OTHER): Payer: Medicare Other

## 2014-09-08 ENCOUNTER — Ambulatory Visit: Payer: Managed Care, Other (non HMO) | Admitting: Nutrition

## 2014-09-08 ENCOUNTER — Other Ambulatory Visit (HOSPITAL_BASED_OUTPATIENT_CLINIC_OR_DEPARTMENT_OTHER): Payer: Medicare Other

## 2014-09-08 ENCOUNTER — Other Ambulatory Visit: Payer: Self-pay | Admitting: Radiation Oncology

## 2014-09-08 ENCOUNTER — Telehealth: Payer: Self-pay | Admitting: Medical Oncology

## 2014-09-08 DIAGNOSIS — C3492 Malignant neoplasm of unspecified part of left bronchus or lung: Secondary | ICD-10-CM | POA: Diagnosis not present

## 2014-09-08 DIAGNOSIS — Z8701 Personal history of pneumonia (recurrent): Secondary | ICD-10-CM | POA: Diagnosis not present

## 2014-09-08 DIAGNOSIS — Z79891 Long term (current) use of opiate analgesic: Secondary | ICD-10-CM | POA: Diagnosis not present

## 2014-09-08 DIAGNOSIS — L598 Other specified disorders of the skin and subcutaneous tissue related to radiation: Secondary | ICD-10-CM | POA: Diagnosis not present

## 2014-09-08 DIAGNOSIS — C3412 Malignant neoplasm of upper lobe, left bronchus or lung: Secondary | ICD-10-CM

## 2014-09-08 DIAGNOSIS — Z510 Encounter for antineoplastic radiation therapy: Secondary | ICD-10-CM | POA: Diagnosis not present

## 2014-09-08 DIAGNOSIS — R52 Pain, unspecified: Secondary | ICD-10-CM | POA: Diagnosis not present

## 2014-09-08 DIAGNOSIS — Z5111 Encounter for antineoplastic chemotherapy: Secondary | ICD-10-CM

## 2014-09-08 LAB — COMPREHENSIVE METABOLIC PANEL (CC13)
ALT: 28 U/L (ref 0–55)
AST: 21 U/L (ref 5–34)
Albumin: 3.9 g/dL (ref 3.5–5.0)
Alkaline Phosphatase: 69 U/L (ref 40–150)
Anion Gap: 10 mEq/L (ref 3–11)
BILIRUBIN TOTAL: 0.41 mg/dL (ref 0.20–1.20)
BUN: 24.5 mg/dL (ref 7.0–26.0)
CALCIUM: 8.7 mg/dL (ref 8.4–10.4)
CHLORIDE: 106 meq/L (ref 98–109)
CO2: 25 mEq/L (ref 22–29)
Creatinine: 0.8 mg/dL (ref 0.7–1.3)
EGFR: >90 mL/min/{1.73_m2} (ref >90)
Glucose: 82 mg/dl (ref 70–140)
Potassium: 4.8 mEq/L (ref 3.5–5.1)
Sodium: 141 mEq/L (ref 136–145)
Total Protein: 7.2 g/dL (ref 6.4–8.3)

## 2014-09-08 LAB — CBC WITH DIFFERENTIAL/PLATELET
BASO%: 0.5 % (ref 0.0–2.0)
BASOS ABS: 0 10*3/uL (ref 0.0–0.1)
EOS ABS: 0.3 10*3/uL (ref 0.0–0.5)
EOS%: 4.5 % (ref 0.0–7.0)
HCT: 39.8 % (ref 38.4–49.9)
HEMOGLOBIN: 13.2 g/dL (ref 13.0–17.1)
LYMPH%: 7.4 % — ABNORMAL LOW (ref 14.0–49.0)
MCH: 29.3 pg (ref 27.2–33.4)
MCHC: 33.1 g/dL (ref 32.0–36.0)
MCV: 88.7 fL (ref 79.3–98.0)
MONO#: 0.5 10*3/uL (ref 0.1–0.9)
MONO%: 7.7 % (ref 0.0–14.0)
NEUT%: 79.9 % — AB (ref 39.0–75.0)
NEUTROS ABS: 5.1 10*3/uL (ref 1.5–6.5)
Platelets: 202 10*3/uL (ref 140–400)
RBC: 4.48 10*6/uL (ref 4.20–5.82)
RDW: 13.4 % (ref 11.0–14.6)
WBC: 6.4 10*3/uL (ref 4.0–10.3)
lymph#: 0.5 10*3/uL — ABNORMAL LOW (ref 0.9–3.3)

## 2014-09-08 MED ORDER — DIPHENHYDRAMINE HCL 50 MG/ML IJ SOLN
INTRAMUSCULAR | Status: AC
  Filled 2014-09-08: qty 1

## 2014-09-08 MED ORDER — FAMOTIDINE IN NACL 20-0.9 MG/50ML-% IV SOLN
20.0000 mg | Freq: Once | INTRAVENOUS | Status: AC
  Administered 2014-09-08: 20 mg via INTRAVENOUS

## 2014-09-08 MED ORDER — FAMOTIDINE IN NACL 20-0.9 MG/50ML-% IV SOLN
INTRAVENOUS | Status: AC
  Filled 2014-09-08: qty 50

## 2014-09-08 MED ORDER — SODIUM CHLORIDE 0.9 % IV SOLN
Freq: Once | INTRAVENOUS | Status: AC
  Administered 2014-09-08: 12:00:00 via INTRAVENOUS

## 2014-09-08 MED ORDER — SUCRALFATE 1 GM/10ML PO SUSP: 1.0000 g | Freq: Three times a day (TID) | ORAL | Status: DC

## 2014-09-08 MED ORDER — DEXAMETHASONE SODIUM PHOSPHATE 100 MG/10ML IJ SOLN
Freq: Once | INTRAMUSCULAR | Status: AC
  Administered 2014-09-08: 13:00:00 via INTRAVENOUS
  Filled 2014-09-08: qty 8

## 2014-09-08 MED ORDER — PACLITAXEL CHEMO INJECTION 300 MG/50ML
45.0000 mg/m2 | Freq: Once | INTRAVENOUS | Status: AC
  Administered 2014-09-08: 90 mg via INTRAVENOUS
  Filled 2014-09-08: qty 15

## 2014-09-08 MED ORDER — DIPHENHYDRAMINE HCL 50 MG/ML IJ SOLN
25.0000 mg | Freq: Once | INTRAMUSCULAR | Status: AC
  Administered 2014-09-08: 25 mg via INTRAVENOUS

## 2014-09-08 MED ORDER — SODIUM CHLORIDE 0.9 % IV SOLN
215.6000 mg | Freq: Once | INTRAVENOUS | Status: AC
  Administered 2014-09-08: 220 mg via INTRAVENOUS
  Filled 2014-09-08: qty 22

## 2014-09-08 NOTE — Patient Instructions (Signed)
[facility] Discharge Instructions for Patients Receiving Chemotherapy  Today you received the following chemotherapy agents Taxol/Carboplatin To help prevent nausea and vomiting after your treatment, we encourage you to take your nausea medication as prescribed.   If you develop nausea and vomiting that is not controlled by your nausea medication, call the clinic.   BELOW ARE SYMPTOMS THAT SHOULD BE REPORTED IMMEDIATELY:  *FEVER GREATER THAN 100.5 F  *CHILLS WITH OR WITHOUT FEVER  NAUSEA AND VOMITING THAT IS NOT CONTROLLED WITH YOUR NAUSEA MEDICATION  *UNUSUAL SHORTNESS OF BREATH  *UNUSUAL BRUISING OR BLEEDING  TENDERNESS IN MOUTH AND THROAT WITH OR WITHOUT PRESENCE OF ULCERS  *URINARY PROBLEMS  *BOWEL PROBLEMS  UNUSUAL RASH Items with * indicate a potential emergency and should be followed up as soon as possible.  Feel free to call the clinic you have any questions or concerns. The clinic phone number is [phone].  Please show the CHEMO ALERT CARD at check-in to the Emergency Department and triage nurse.   

## 2014-09-08 NOTE — Progress Notes (Signed)
69 year old male diagnosed with lung cancer receiving concurrent chemoradiation therapy.  Past medical history includes COPD, CHI, tobacco, hypertension, anxiety and seizures.  Medications include vitamin C, Klonopin, Colace, saw palmetto, and St. John's wort.  Labs were reviewed.  Height: 5 feet 11 inches. Weight: 183.6 pounds. BMI: 25.62.  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 Patient is agreeable to taking pain medications which does help him to swallow easier. Wife has been preparing soft, pured foods along with smoothies.  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  Intervention: Patient educated to consume small frequent meals and snacks utilizing high protein, soft, moist foods. Educated patient's wife on pureing foods. Encouraged high-protein smoothies. Provided fact sheets.  Questions were answered.  Teach back method used. Patient can continue oral nutrition supplements to maintain present weight.  Monitoring, evaluation, goals: Patient will tolerate adequate calories and protein to minimize weight loss throughout treatment.  Next visit: Patient to contact me with questions or concerns.  **Disclaimer: This note was dictated with voice recognition software. Similar sounding words can inadvertently be transcribed and this note may contain transcription errors which may not have been corrected upon publication of note.**

## 2014-09-08 NOTE — Telephone Encounter (Signed)
Pt reports a lot of pain with swallowing. He is eating pureed food and has to take Hydrocodone before he can eat. He is not on nay carafate. Reported to Santiago Glad ,RN to f/u with [facility].

## 2014-09-08 NOTE — Progress Notes (Signed)
Patient complained of jerky and painful legs after last Taxol infusion. Dr. Julien Nordmann notified, order given to reduce benadryl to 25 mg IV push. Pharmacy notified.

## 2014-09-09 ENCOUNTER — Ambulatory Visit
Admission: RE | Admit: 2014-09-09 | Discharge: 2014-09-09 | Disposition: A | Discharge status: Home / Self Care | Payer: Medicare Other | Source: Ambulatory Visit | Attending: Radiation Oncology | Admitting: Radiation Oncology

## 2014-09-09 ENCOUNTER — Encounter: Payer: Self-pay | Admitting: Radiation Oncology

## 2014-09-09 VITALS — BP 117/78 | HR 93 | Temp 97.5°F | Resp 16 | Ht 71.0 in | Wt 185.7 lb

## 2014-09-09 DIAGNOSIS — R52 Pain, unspecified: Secondary | ICD-10-CM | POA: Diagnosis not present

## 2014-09-09 DIAGNOSIS — C3412 Malignant neoplasm of upper lobe, left bronchus or lung: Secondary | ICD-10-CM | POA: Diagnosis not present

## 2014-09-09 DIAGNOSIS — L598 Other specified disorders of the skin and subcutaneous tissue related to radiation: Secondary | ICD-10-CM | POA: Diagnosis not present

## 2014-09-09 DIAGNOSIS — C3492 Malignant neoplasm of unspecified part of left bronchus or lung: Secondary | ICD-10-CM

## 2014-09-09 DIAGNOSIS — Z510 Encounter for antineoplastic radiation therapy: Secondary | ICD-10-CM | POA: Insufficient documentation

## 2014-09-09 DIAGNOSIS — Z87891 Personal history of nicotine dependence: Secondary | ICD-10-CM | POA: Insufficient documentation

## 2014-09-09 DIAGNOSIS — Z8701 Personal history of pneumonia (recurrent): Secondary | ICD-10-CM | POA: Diagnosis not present

## 2014-09-09 DIAGNOSIS — Z79891 Long term (current) use of opiate analgesic: Secondary | ICD-10-CM | POA: Diagnosis not present

## 2014-09-09 MED ORDER — HYDROCODONE-ACETAMINOPHEN 5-325 MG PO TABS: 1.0000 | ORAL_TABLET | Freq: Four times a day (QID) | ORAL | Status: DC | PRN

## 2014-09-09 MED ORDER — HYDROCODONE-ACETAMINOPHEN 5-325 MG PO TABS
1.0000 | ORAL_TABLET | Freq: Once | ORAL | Status: AC
  Administered 2014-09-09: 1 via ORAL
  Filled 2014-09-09: qty 1

## 2014-09-09 NOTE — Progress Notes (Addendum)
Jeffrey Hines was given 1 tablet of hydrocodone/acetaminohen 5/32/5 per Dr. Sondra Come for pain 7/10 in his left chest.  His wife is driving today.

## 2014-09-09 NOTE — Progress Notes (Signed)
Jeffrey Hines has completed 12 fractions to his left chest.  He reports pain in his left sternal area that he is rating at a 7/10.  He reports it feels like heartburn.  He is taking hydrocodone/acetaminophen 1-2 times a day and ibuprofen twice a day. He did not take them this morning.  He report that he has started eating a pureed diet because he is having pain in his chest with swallowing.  He reports having a cough with green sputum again this morning.  He has finished Levaquin last week.  He reports his voice is hoarse from his nebulizer treatment.  He denies shortness of breath.  He is using 2 L of oxygen.  The skin on his left chest and left upper back is red.  He is using radiaplex.  He had chemotherapy yesterday.  He reports fatigue.  BP 117/78 mmHg  Pulse 93  Temp(Src) 97.5 F (36.4 C) (Oral)  Resp 16  Ht 5\' 11"  (1.803 m)  Wt 185 lb 11.2 oz (84.233 kg)  BMI 25.91 kg/m2  SpO2 99%

## 2014-09-09 NOTE — Progress Notes (Signed)
  Radiation Oncology         [phone] ________________________________  Name: Jeffrey Hines MRN: [id number]  Date: 09/09/2014  DOB: Feb 26, 1946  Weekly Radiation Therapy Management  DIAGNOSIS: Stage II-B (T3, N0, M0) squamous cell carcinoma of the left upper central lung  Current Dose: 21.6 Gy     Planned Dose:  45 Gy  Narrative . . . . . . . . The patient presents for routine under treatment assessment.                                   The patient denies any chills or fever. He has completed his antibiotic course of Levaquin. The patient reports occasional greenish sputum but no blood-tinged sputum. He continues to have some pain along the chest area and takes hydrocodone for this issue. On cone beam imaging the patient's left upper lung appears to be opening up. The patient proceeded to undergo  another treatment planning CT scan which confirmed the some expansion of the left upper lobe. Patient's current radiation plan was placed on this new CT scan and there appears to be good coverage of the target areas so replanning will not be necessary at this time.                                 Set-up films were reviewed.                                 The chart was checked. Physical Findings. . .  height is 5\' 11"  (1.803 m) and weight is 185 lb 11.2 oz (84.233 kg). His oral temperature is 97.5 F (36.4 C). His blood pressure is 117/78 and his pulse is 93. His respiration is 16 and oxygen saturation is 99%. . The lungs are clear. The heart has a regular rhythm and rate. Impression . . . . . . . The patient is tolerating radiation. Plan . . . . . . . . . . . . Continue treatment as planned.  ________________________________   Blair Promise, PhD, MD

## 2014-09-10 ENCOUNTER — Ambulatory Visit
Admission: RE | Admit: 2014-09-10 | Discharge: 2014-09-10 | Disposition: A | Discharge status: Home / Self Care | Payer: Medicare Other | Source: Ambulatory Visit | Attending: Radiation Oncology | Admitting: Radiation Oncology

## 2014-09-10 DIAGNOSIS — Z8701 Personal history of pneumonia (recurrent): Secondary | ICD-10-CM | POA: Diagnosis not present

## 2014-09-10 DIAGNOSIS — Z510 Encounter for antineoplastic radiation therapy: Secondary | ICD-10-CM | POA: Diagnosis not present

## 2014-09-10 DIAGNOSIS — Z79891 Long term (current) use of opiate analgesic: Secondary | ICD-10-CM | POA: Diagnosis not present

## 2014-09-10 DIAGNOSIS — L598 Other specified disorders of the skin and subcutaneous tissue related to radiation: Secondary | ICD-10-CM | POA: Diagnosis not present

## 2014-09-10 DIAGNOSIS — C3412 Malignant neoplasm of upper lobe, left bronchus or lung: Secondary | ICD-10-CM | POA: Diagnosis not present

## 2014-09-10 DIAGNOSIS — C3492 Malignant neoplasm of unspecified part of left bronchus or lung: Secondary | ICD-10-CM | POA: Diagnosis not present

## 2014-09-10 DIAGNOSIS — R52 Pain, unspecified: Secondary | ICD-10-CM | POA: Diagnosis not present

## 2014-09-11 ENCOUNTER — Ambulatory Visit
Admission: RE | Admit: 2014-09-11 | Discharge: 2014-09-11 | Disposition: A | Discharge status: Home / Self Care | Payer: Medicare Other | Source: Ambulatory Visit | Attending: Radiation Oncology | Admitting: Radiation Oncology

## 2014-09-11 DIAGNOSIS — R52 Pain, unspecified: Secondary | ICD-10-CM | POA: Diagnosis not present

## 2014-09-11 DIAGNOSIS — L598 Other specified disorders of the skin and subcutaneous tissue related to radiation: Secondary | ICD-10-CM | POA: Diagnosis not present

## 2014-09-11 DIAGNOSIS — Z510 Encounter for antineoplastic radiation therapy: Secondary | ICD-10-CM | POA: Diagnosis not present

## 2014-09-11 DIAGNOSIS — Z8701 Personal history of pneumonia (recurrent): Secondary | ICD-10-CM | POA: Diagnosis not present

## 2014-09-11 DIAGNOSIS — C3492 Malignant neoplasm of unspecified part of left bronchus or lung: Secondary | ICD-10-CM | POA: Diagnosis not present

## 2014-09-11 DIAGNOSIS — Z79891 Long term (current) use of opiate analgesic: Secondary | ICD-10-CM | POA: Diagnosis not present

## 2014-09-11 DIAGNOSIS — C3412 Malignant neoplasm of upper lobe, left bronchus or lung: Secondary | ICD-10-CM | POA: Diagnosis not present

## 2014-09-12 ENCOUNTER — Telehealth: Payer: Self-pay | Admitting: Pulmonary Disease

## 2014-09-12 ENCOUNTER — Ambulatory Visit
Admission: RE | Admit: 2014-09-12 | Discharge: 2014-09-12 | Disposition: A | Discharge status: Home / Self Care | Payer: Medicare Other | Source: Ambulatory Visit | Attending: Radiation Oncology | Admitting: Radiation Oncology

## 2014-09-12 DIAGNOSIS — R52 Pain, unspecified: Secondary | ICD-10-CM | POA: Diagnosis not present

## 2014-09-12 DIAGNOSIS — Z79891 Long term (current) use of opiate analgesic: Secondary | ICD-10-CM | POA: Diagnosis not present

## 2014-09-12 DIAGNOSIS — Z510 Encounter for antineoplastic radiation therapy: Secondary | ICD-10-CM | POA: Diagnosis not present

## 2014-09-12 DIAGNOSIS — C3492 Malignant neoplasm of unspecified part of left bronchus or lung: Secondary | ICD-10-CM

## 2014-09-12 DIAGNOSIS — C3490 Malignant neoplasm of unspecified part of unspecified bronchus or lung: Secondary | ICD-10-CM

## 2014-09-12 DIAGNOSIS — L598 Other specified disorders of the skin and subcutaneous tissue related to radiation: Secondary | ICD-10-CM | POA: Diagnosis not present

## 2014-09-12 DIAGNOSIS — C3412 Malignant neoplasm of upper lobe, left bronchus or lung: Secondary | ICD-10-CM | POA: Diagnosis not present

## 2014-09-12 DIAGNOSIS — Z8701 Personal history of pneumonia (recurrent): Secondary | ICD-10-CM | POA: Diagnosis not present

## 2014-09-12 MED ORDER — RADIAPLEXRX EX GEL
Freq: Once | CUTANEOUS | Status: AC
  Administered 2014-09-12: 14:00:00 via TOPICAL

## 2014-09-12 NOTE — Telephone Encounter (Signed)
Patient needs order for home fill system.  They have not received the POC order yet and she would like for patient to have home fill system until they receive POC. Order entered.  Nothing further needed.

## 2014-09-15 ENCOUNTER — Telehealth: Payer: Self-pay | Admitting: Internal Medicine

## 2014-09-15 ENCOUNTER — Other Ambulatory Visit (HOSPITAL_BASED_OUTPATIENT_CLINIC_OR_DEPARTMENT_OTHER): Payer: Medicare Other

## 2014-09-15 ENCOUNTER — Ambulatory Visit (HOSPITAL_BASED_OUTPATIENT_CLINIC_OR_DEPARTMENT_OTHER): Payer: Medicare Other | Admitting: Physician Assistant

## 2014-09-15 ENCOUNTER — Encounter: Payer: Self-pay | Admitting: Physician Assistant

## 2014-09-15 ENCOUNTER — Other Ambulatory Visit: Payer: Medicare Other

## 2014-09-15 ENCOUNTER — Ambulatory Visit
Admission: RE | Admit: 2014-09-15 | Discharge: 2014-09-15 | Disposition: A | Discharge status: Home / Self Care | Payer: Medicare Other | Source: Ambulatory Visit | Attending: Radiation Oncology | Admitting: Radiation Oncology

## 2014-09-15 ENCOUNTER — Ambulatory Visit (HOSPITAL_BASED_OUTPATIENT_CLINIC_OR_DEPARTMENT_OTHER): Payer: Medicare Other

## 2014-09-15 VITALS — BP 113/71 | HR 79 | Temp 97.7°F | Resp 19 | Ht 71.0 in | Wt 184.3 lb

## 2014-09-15 DIAGNOSIS — C3412 Malignant neoplasm of upper lobe, left bronchus or lung: Secondary | ICD-10-CM

## 2014-09-15 DIAGNOSIS — Z8701 Personal history of pneumonia (recurrent): Secondary | ICD-10-CM | POA: Diagnosis not present

## 2014-09-15 DIAGNOSIS — K208 Other esophagitis: Secondary | ICD-10-CM | POA: Diagnosis not present

## 2014-09-15 DIAGNOSIS — C3492 Malignant neoplasm of unspecified part of left bronchus or lung: Secondary | ICD-10-CM

## 2014-09-15 DIAGNOSIS — Z5111 Encounter for antineoplastic chemotherapy: Secondary | ICD-10-CM

## 2014-09-15 DIAGNOSIS — R52 Pain, unspecified: Secondary | ICD-10-CM | POA: Diagnosis not present

## 2014-09-15 DIAGNOSIS — L598 Other specified disorders of the skin and subcutaneous tissue related to radiation: Secondary | ICD-10-CM | POA: Diagnosis not present

## 2014-09-15 DIAGNOSIS — Z510 Encounter for antineoplastic radiation therapy: Secondary | ICD-10-CM | POA: Diagnosis not present

## 2014-09-15 DIAGNOSIS — Z79891 Long term (current) use of opiate analgesic: Secondary | ICD-10-CM | POA: Diagnosis not present

## 2014-09-15 LAB — COMPREHENSIVE METABOLIC PANEL (CC13)
ALT: 30 U/L (ref 0–55)
AST: 19 U/L (ref 5–34)
Albumin: 3.6 g/dL (ref 3.5–5.0)
Alkaline Phosphatase: 69 U/L (ref 40–150)
Anion Gap: 10 mEq/L (ref 3–11)
BUN: 22 mg/dL (ref 7.0–26.0)
CHLORIDE: 109 meq/L (ref 98–109)
CO2: 24 meq/L (ref 22–29)
CREATININE: 0.8 mg/dL (ref 0.7–1.3)
Calcium: 8.6 mg/dL (ref 8.4–10.4)
Glucose: 130 mg/dl (ref 70–140)
Potassium: 4.3 mEq/L (ref 3.5–5.1)
Sodium: 142 mEq/L (ref 136–145)
TOTAL PROTEIN: 6.6 g/dL (ref 6.4–8.3)
Total Bilirubin: 0.36 mg/dL (ref 0.20–1.20)

## 2014-09-15 LAB — CBC WITH DIFFERENTIAL/PLATELET
BASO%: 0.6 % (ref 0.0–2.0)
Basophils Absolute: 0 10*3/uL (ref 0.0–0.1)
EOS%: 2.1 % (ref 0.0–7.0)
Eosinophils Absolute: 0.1 10*3/uL (ref 0.0–0.5)
HEMATOCRIT: 35.4 % — AB (ref 38.4–49.9)
HEMOGLOBIN: 12 g/dL — AB (ref 13.0–17.1)
LYMPH#: 0.4 10*3/uL — AB (ref 0.9–3.3)
LYMPH%: 7.5 % — ABNORMAL LOW (ref 14.0–49.0)
MCH: 29.8 pg (ref 27.2–33.4)
MCHC: 33.9 g/dL (ref 32.0–36.0)
MCV: 88 fL (ref 79.3–98.0)
MONO#: 0.6 10*3/uL (ref 0.1–0.9)
MONO%: 10.4 % (ref 0.0–14.0)
NEUT%: 79.4 % — ABNORMAL HIGH (ref 39.0–75.0)
NEUTROS ABS: 4.2 10*3/uL (ref 1.5–6.5)
PLATELETS: 195 10*3/uL (ref 140–400)
RBC: 4.03 10*6/uL — ABNORMAL LOW (ref 4.20–5.82)
RDW: 13.5 % (ref 11.0–14.6)
WBC: 5.3 10*3/uL (ref 4.0–10.3)

## 2014-09-15 MED ORDER — DIPHENHYDRAMINE HCL 50 MG/ML IJ SOLN
INTRAMUSCULAR | Status: AC
  Filled 2014-09-15: qty 1

## 2014-09-15 MED ORDER — PACLITAXEL CHEMO INJECTION 300 MG/50ML
45.0000 mg/m2 | Freq: Once | INTRAVENOUS | Status: AC
  Administered 2014-09-15: 90 mg via INTRAVENOUS
  Filled 2014-09-15: qty 15

## 2014-09-15 MED ORDER — DEXAMETHASONE SODIUM PHOSPHATE 100 MG/10ML IJ SOLN
Freq: Once | INTRAMUSCULAR | Status: AC
  Administered 2014-09-15: 12:00:00 via INTRAVENOUS
  Filled 2014-09-15: qty 8

## 2014-09-15 MED ORDER — DIPHENHYDRAMINE HCL 50 MG/ML IJ SOLN
25.0000 mg | Freq: Once | INTRAMUSCULAR | Status: AC
  Administered 2014-09-15: 25 mg via INTRAVENOUS

## 2014-09-15 MED ORDER — SODIUM CHLORIDE 0.9 % IV SOLN
Freq: Once | INTRAVENOUS | Status: AC
  Administered 2014-09-15: 11:00:00 via INTRAVENOUS

## 2014-09-15 MED ORDER — FAMOTIDINE IN NACL 20-0.9 MG/50ML-% IV SOLN
INTRAVENOUS | Status: AC
  Filled 2014-09-15: qty 50

## 2014-09-15 MED ORDER — SODIUM CHLORIDE 0.9 % IV SOLN
215.6000 mg | Freq: Once | INTRAVENOUS | Status: AC
  Administered 2014-09-15: 220 mg via INTRAVENOUS
  Filled 2014-09-15: qty 22

## 2014-09-15 MED ORDER — FAMOTIDINE IN NACL 20-0.9 MG/50ML-% IV SOLN
20.0000 mg | Freq: Once | INTRAVENOUS | Status: AC
  Administered 2014-09-15: 20 mg via INTRAVENOUS

## 2014-09-15 NOTE — Telephone Encounter (Signed)
gave and printed appt sched and avs for pt for April
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
## 2014-09-15 NOTE — Progress Notes (Signed)
1317 taxol complete; patient reporting numbness to L side face from temporal to mid-cheek. Although he can feel my touch, he believes this is an "internal sensation." No other facial abnormalities, no left arm or leg weakness. Normal speech. Dr. Julien Nordmann notified. Per MD observe patient x15 minutes before starting carboplatin.

## 2014-09-15 NOTE — Patient Instructions (Signed)
[facility] Discharge Instructions for Patients Receiving Chemotherapy  Today you received the following chemotherapy agents Taxol and Carboplatin.  To help prevent nausea and vomiting after your treatment, we encourage you to take your nausea medication as prescribed.   If you develop nausea and vomiting that is not controlled by your nausea medication, call the clinic.   BELOW ARE SYMPTOMS THAT SHOULD BE REPORTED IMMEDIATELY:  *FEVER GREATER THAN 100.5 F  *CHILLS WITH OR WITHOUT FEVER  NAUSEA AND VOMITING THAT IS NOT CONTROLLED WITH YOUR NAUSEA MEDICATION  *UNUSUAL SHORTNESS OF BREATH  *UNUSUAL BRUISING OR BLEEDING  TENDERNESS IN MOUTH AND THROAT WITH OR WITHOUT PRESENCE OF ULCERS  *URINARY PROBLEMS  *BOWEL PROBLEMS  UNUSUAL RASH Items with * indicate a potential emergency and should be followed up as soon as possible.  Feel free to call the clinic you have any questions or concerns. The clinic phone number is [phone].  Please show the CHEMO ALERT CARD at check-in to the Emergency Department and triage nurse.   

## 2014-09-15 NOTE — Patient Instructions (Signed)
Complete your course of concurrent chemoradiation as scheduled Follow-up in 2 weeks for another symptom management visit

## 2014-09-16 ENCOUNTER — Ambulatory Visit
Admission: RE | Admit: 2014-09-16 | Discharge: 2014-09-16 | Disposition: A | Discharge status: Home / Self Care | Payer: Medicare Other | Source: Ambulatory Visit | Attending: Radiation Oncology | Admitting: Radiation Oncology

## 2014-09-16 ENCOUNTER — Encounter: Payer: Self-pay | Admitting: Radiation Oncology

## 2014-09-16 VITALS — BP 118/84 | HR 104 | Temp 98.8°F | Resp 16 | Ht 71.0 in | Wt 187.1 lb

## 2014-09-16 DIAGNOSIS — R52 Pain, unspecified: Secondary | ICD-10-CM | POA: Diagnosis not present

## 2014-09-16 DIAGNOSIS — C3492 Malignant neoplasm of unspecified part of left bronchus or lung: Secondary | ICD-10-CM

## 2014-09-16 DIAGNOSIS — C3412 Malignant neoplasm of upper lobe, left bronchus or lung: Secondary | ICD-10-CM | POA: Diagnosis not present

## 2014-09-16 DIAGNOSIS — Z8701 Personal history of pneumonia (recurrent): Secondary | ICD-10-CM | POA: Diagnosis not present

## 2014-09-16 DIAGNOSIS — Z79891 Long term (current) use of opiate analgesic: Secondary | ICD-10-CM | POA: Diagnosis not present

## 2014-09-16 DIAGNOSIS — Z510 Encounter for antineoplastic radiation therapy: Secondary | ICD-10-CM | POA: Diagnosis not present

## 2014-09-16 DIAGNOSIS — L598 Other specified disorders of the skin and subcutaneous tissue related to radiation: Secondary | ICD-10-CM | POA: Diagnosis not present

## 2014-09-16 NOTE — Progress Notes (Signed)
Jeffrey Hines has completed 17 fractions to his left chest.  He denies pain today due to taking a pain pill but continues to have pain in his left chest/sternal area.  He has been taking 2 norco per day along with ibuprofen . He reports his breathing feels better.  He reports that he is now able to bend over.  He reports his cough is getting better and his sputum is lighter.  He continues to have trouble swallowing.  He is taking Carafate about 3 times a day.  Encouraged him to take it 4 times a day.  His weight is up 2 lbs from last week.  He reports that the treatment on Friday was longer and he had a lot of pain afterwards.  He is wondering if something was different with treatment.  He had chemotherapy yesterday.  He denies nausea.  The skin on his left chest and left upper chest is red.  He is using radiaplex gel.  He reports fatigue.  BP 118/84 mmHg  Pulse 104  Temp(Src) 98.8 F (37.1 C) (Oral)  Resp 16  Ht 5\' 11"  (1.803 m)  Wt 187 lb 1.6 oz (84.868 kg)  BMI 26.11 kg/m2  SpO2 100%

## 2014-09-16 NOTE — Progress Notes (Signed)
  Radiation Oncology         [phone] ________________________________  Name: Jeffrey Hines MRN: [id number]  Date: 09/16/2014  DOB: Aug 04, 1945  Weekly Radiation Therapy Management  DIAGNOSIS: Stage II-B (T3, N0, M0) squamous cell carcinoma of the left upper central lung  Current Dose: 30.6 Gy     Planned Dose:  45 Gy  Narrative . . . . . . . . The patient presents for routine under treatment assessment.                                   The patient is without complaint. He does have some difficulty swallowing bread. He is taking his pain medication twice daily. He does have some fatigue                                 Set-up films were reviewed.                                 The chart was checked. Physical Findings. . .  height is 5\' 11"  (1.803 m) and weight is 187 lb 1.6 oz (84.868 kg). His oral temperature is 98.8 F (37.1 C). His blood pressure is 118/84 and his pulse is 104. His respiration is 16 and oxygen saturation is 100%. . Weight essentially stable.  No significant changes. Some erythema to the skin of the upper left back region. Impression . . . . . . . The patient is tolerating radiation. Plan . . . . . . . . . . . . Continue treatment as planned.  ________________________________   Blair Promise, PhD, MD

## 2014-09-17 ENCOUNTER — Ambulatory Visit
Admission: RE | Admit: 2014-09-17 | Discharge: 2014-09-17 | Disposition: A | Discharge status: Home / Self Care | Payer: Medicare Other | Source: Ambulatory Visit | Attending: Radiation Oncology | Admitting: Radiation Oncology

## 2014-09-17 ENCOUNTER — Telehealth: Payer: Self-pay | Admitting: Internal Medicine

## 2014-09-17 DIAGNOSIS — C3492 Malignant neoplasm of unspecified part of left bronchus or lung: Secondary | ICD-10-CM | POA: Diagnosis not present

## 2014-09-17 DIAGNOSIS — Z8701 Personal history of pneumonia (recurrent): Secondary | ICD-10-CM | POA: Diagnosis not present

## 2014-09-17 DIAGNOSIS — Z510 Encounter for antineoplastic radiation therapy: Secondary | ICD-10-CM | POA: Diagnosis not present

## 2014-09-17 DIAGNOSIS — Z79891 Long term (current) use of opiate analgesic: Secondary | ICD-10-CM | POA: Diagnosis not present

## 2014-09-17 DIAGNOSIS — R52 Pain, unspecified: Secondary | ICD-10-CM | POA: Diagnosis not present

## 2014-09-17 DIAGNOSIS — C3412 Malignant neoplasm of upper lobe, left bronchus or lung: Secondary | ICD-10-CM | POA: Diagnosis not present

## 2014-09-17 DIAGNOSIS — L598 Other specified disorders of the skin and subcutaneous tissue related to radiation: Secondary | ICD-10-CM | POA: Diagnosis not present

## 2014-09-17 NOTE — Telephone Encounter (Signed)
Faxed pt medical records to [facility] [phone]

## 2014-09-18 ENCOUNTER — Ambulatory Visit
Admission: RE | Admit: 2014-09-18 | Discharge: 2014-09-18 | Disposition: A | Discharge status: Home / Self Care | Payer: Medicare Other | Source: Ambulatory Visit | Attending: Radiation Oncology | Admitting: Radiation Oncology

## 2014-09-18 DIAGNOSIS — Z8701 Personal history of pneumonia (recurrent): Secondary | ICD-10-CM | POA: Diagnosis not present

## 2014-09-18 DIAGNOSIS — C3412 Malignant neoplasm of upper lobe, left bronchus or lung: Secondary | ICD-10-CM | POA: Diagnosis not present

## 2014-09-18 DIAGNOSIS — R52 Pain, unspecified: Secondary | ICD-10-CM | POA: Diagnosis not present

## 2014-09-18 DIAGNOSIS — C3492 Malignant neoplasm of unspecified part of left bronchus or lung: Secondary | ICD-10-CM

## 2014-09-18 DIAGNOSIS — L598 Other specified disorders of the skin and subcutaneous tissue related to radiation: Secondary | ICD-10-CM | POA: Diagnosis not present

## 2014-09-18 DIAGNOSIS — Z79891 Long term (current) use of opiate analgesic: Secondary | ICD-10-CM | POA: Diagnosis not present

## 2014-09-18 DIAGNOSIS — Z510 Encounter for antineoplastic radiation therapy: Secondary | ICD-10-CM | POA: Diagnosis not present

## 2014-09-18 MED ORDER — BIAFINE EX EMUL
Freq: Every day | CUTANEOUS | Status: DC
  Administered 2014-09-18: 11:00:00 via TOPICAL

## 2014-09-18 NOTE — Progress Notes (Signed)
Patient presented to the clinic today concerned about dermatitis in the center of his chest. Switched patient from radiaplex to biafine. Encouraged patient to keep skin covered. Oxygen therapy 2 liter via nasal cannula noted. Patient concerned he blew his nose this morning and saw a scant amount of bright red blood on the tissue. Explained the continuous oxygen is causing dryness in his nares. Advised patient to humidify air in bedroom while sleeping, consider using saline nasal spray, and apply vaseline just inside his nares each day. Patient verbalized understanding of all discussed.

## 2014-09-19 ENCOUNTER — Ambulatory Visit
Admission: RE | Admit: 2014-09-19 | Discharge: 2014-09-19 | Disposition: A | Discharge status: Home / Self Care | Payer: Medicare Other | Source: Ambulatory Visit | Attending: Radiation Oncology | Admitting: Radiation Oncology

## 2014-09-19 DIAGNOSIS — C3412 Malignant neoplasm of upper lobe, left bronchus or lung: Secondary | ICD-10-CM | POA: Diagnosis not present

## 2014-09-19 DIAGNOSIS — L598 Other specified disorders of the skin and subcutaneous tissue related to radiation: Secondary | ICD-10-CM | POA: Diagnosis not present

## 2014-09-19 DIAGNOSIS — R52 Pain, unspecified: Secondary | ICD-10-CM | POA: Diagnosis not present

## 2014-09-19 DIAGNOSIS — Z79891 Long term (current) use of opiate analgesic: Secondary | ICD-10-CM | POA: Diagnosis not present

## 2014-09-19 DIAGNOSIS — C3492 Malignant neoplasm of unspecified part of left bronchus or lung: Secondary | ICD-10-CM | POA: Diagnosis not present

## 2014-09-19 DIAGNOSIS — Z510 Encounter for antineoplastic radiation therapy: Secondary | ICD-10-CM | POA: Diagnosis not present

## 2014-09-19 DIAGNOSIS — Z8701 Personal history of pneumonia (recurrent): Secondary | ICD-10-CM | POA: Diagnosis not present

## 2014-09-22 ENCOUNTER — Encounter (HOSPITAL_COMMUNITY): Payer: Self-pay | Admitting: Dentistry

## 2014-09-22 ENCOUNTER — Ambulatory Visit (HOSPITAL_BASED_OUTPATIENT_CLINIC_OR_DEPARTMENT_OTHER): Payer: Medicare Other

## 2014-09-22 ENCOUNTER — Telehealth: Payer: Self-pay | Admitting: *Deleted

## 2014-09-22 ENCOUNTER — Ambulatory Visit (HOSPITAL_COMMUNITY): Payer: Medicaid - Dental | Admitting: Dentistry

## 2014-09-22 ENCOUNTER — Ambulatory Visit
Admission: RE | Admit: 2014-09-22 | Discharge: 2014-09-22 | Disposition: A | Discharge status: Home / Self Care | Payer: Medicare Other | Source: Ambulatory Visit | Attending: Radiation Oncology | Admitting: Radiation Oncology

## 2014-09-22 ENCOUNTER — Other Ambulatory Visit (HOSPITAL_BASED_OUTPATIENT_CLINIC_OR_DEPARTMENT_OTHER): Payer: Medicare Other

## 2014-09-22 VITALS — BP 109/68 | HR 72 | Temp 98.0°F

## 2014-09-22 VITALS — BP 106/72 | HR 86 | Temp 97.4°F | Resp 20

## 2014-09-22 DIAGNOSIS — K08109 Complete loss of teeth, unspecified cause, unspecified class: Secondary | ICD-10-CM

## 2014-09-22 DIAGNOSIS — C3412 Malignant neoplasm of upper lobe, left bronchus or lung: Secondary | ICD-10-CM | POA: Diagnosis present

## 2014-09-22 DIAGNOSIS — C3492 Malignant neoplasm of unspecified part of left bronchus or lung: Secondary | ICD-10-CM

## 2014-09-22 DIAGNOSIS — Z5111 Encounter for antineoplastic chemotherapy: Secondary | ICD-10-CM

## 2014-09-22 DIAGNOSIS — R52 Pain, unspecified: Secondary | ICD-10-CM | POA: Diagnosis not present

## 2014-09-22 DIAGNOSIS — Z8701 Personal history of pneumonia (recurrent): Secondary | ICD-10-CM | POA: Diagnosis not present

## 2014-09-22 DIAGNOSIS — L598 Other specified disorders of the skin and subcutaneous tissue related to radiation: Secondary | ICD-10-CM | POA: Diagnosis not present

## 2014-09-22 DIAGNOSIS — C3490 Malignant neoplasm of unspecified part of unspecified bronchus or lung: Secondary | ICD-10-CM

## 2014-09-22 DIAGNOSIS — Z463 Encounter for fitting and adjustment of dental prosthetic device: Secondary | ICD-10-CM

## 2014-09-22 DIAGNOSIS — Z510 Encounter for antineoplastic radiation therapy: Secondary | ICD-10-CM | POA: Diagnosis not present

## 2014-09-22 DIAGNOSIS — K0820 Unspecified atrophy of edentulous alveolar ridge: Secondary | ICD-10-CM

## 2014-09-22 DIAGNOSIS — Z79891 Long term (current) use of opiate analgesic: Secondary | ICD-10-CM | POA: Diagnosis not present

## 2014-09-22 LAB — CBC WITH DIFFERENTIAL/PLATELET
BASO%: 0.6 % (ref 0.0–2.0)
Basophils Absolute: 0 10*3/uL (ref 0.0–0.1)
EOS%: 1.2 % (ref 0.0–7.0)
Eosinophils Absolute: 0.1 10*3/uL (ref 0.0–0.5)
HEMATOCRIT: 36.2 % — AB (ref 38.4–49.9)
HGB: 11.9 g/dL — ABNORMAL LOW (ref 13.0–17.1)
LYMPH#: 0.3 10*3/uL — AB (ref 0.9–3.3)
LYMPH%: 7.2 % — AB (ref 14.0–49.0)
MCH: 29.3 pg (ref 27.2–33.4)
MCHC: 32.9 g/dL (ref 32.0–36.0)
MCV: 89.2 fL (ref 79.3–98.0)
MONO#: 0.5 10*3/uL (ref 0.1–0.9)
MONO%: 10.3 % (ref 0.0–14.0)
NEUT#: 3.8 10*3/uL (ref 1.5–6.5)
NEUT%: 80.7 % — AB (ref 39.0–75.0)
Platelets: 167 10*3/uL (ref 140–400)
RBC: 4.06 10*6/uL — ABNORMAL LOW (ref 4.20–5.82)
RDW: 13.9 % (ref 11.0–14.6)
WBC: 4.6 10*3/uL (ref 4.0–10.3)

## 2014-09-22 LAB — COMPREHENSIVE METABOLIC PANEL (CC13)
ALBUMIN: 3.8 g/dL (ref 3.5–5.0)
ALT: 22 U/L (ref 0–55)
ANION GAP: 10 meq/L (ref 3–11)
AST: 18 U/L (ref 5–34)
Alkaline Phosphatase: 66 U/L (ref 40–150)
BUN: 21.6 mg/dL (ref 7.0–26.0)
CALCIUM: 8.6 mg/dL (ref 8.4–10.4)
CO2: 24 meq/L (ref 22–29)
Chloride: 107 mEq/L (ref 98–109)
Creatinine: 0.8 mg/dL (ref 0.7–1.3)
EGFR: >90 mL/min/{1.73_m2} (ref >90)
Glucose: 97 mg/dl (ref 70–140)
POTASSIUM: 4.4 meq/L (ref 3.5–5.1)
SODIUM: 141 meq/L (ref 136–145)
TOTAL PROTEIN: 6.8 g/dL (ref 6.4–8.3)
Total Bilirubin: 0.39 mg/dL (ref 0.20–1.20)

## 2014-09-22 MED ORDER — DIPHENHYDRAMINE HCL 50 MG/ML IJ SOLN
INTRAMUSCULAR | Status: AC
Start: 2014-09-22 — End: 2014-09-22
  Filled 2014-09-22: qty 1

## 2014-09-22 MED ORDER — DIPHENHYDRAMINE HCL 50 MG/ML IJ SOLN
25.0000 mg | Freq: Once | INTRAMUSCULAR | Status: AC
  Administered 2014-09-22: 25 mg via INTRAVENOUS

## 2014-09-22 MED ORDER — PACLITAXEL CHEMO INJECTION 300 MG/50ML
45.0000 mg/m2 | Freq: Once | INTRAVENOUS | Status: AC
  Administered 2014-09-22: 90 mg via INTRAVENOUS
  Filled 2014-09-22: qty 15

## 2014-09-22 MED ORDER — SODIUM CHLORIDE 0.9 % IV SOLN
215.6000 mg | Freq: Once | INTRAVENOUS | Status: AC
  Administered 2014-09-22: 220 mg via INTRAVENOUS
  Filled 2014-09-22: qty 22

## 2014-09-22 MED ORDER — SODIUM CHLORIDE 0.9 % IV SOLN
Freq: Once | INTRAVENOUS | Status: AC
  Administered 2014-09-22: 13:00:00 via INTRAVENOUS
  Filled 2014-09-22: qty 8

## 2014-09-22 MED ORDER — FAMOTIDINE IN NACL 20-0.9 MG/50ML-% IV SOLN
20.0000 mg | Freq: Once | INTRAVENOUS | Status: AC
Start: 2014-09-22 — End: 2014-09-22
  Administered 2014-09-22: 20 mg via INTRAVENOUS

## 2014-09-22 MED ORDER — FAMOTIDINE IN NACL 20-0.9 MG/50ML-% IV SOLN
INTRAVENOUS | Status: AC
  Filled 2014-09-22: qty 50

## 2014-09-22 MED ORDER — SODIUM CHLORIDE 0.9 % IV SOLN
Freq: Once | INTRAVENOUS | Status: AC
  Administered 2014-09-22: 13:00:00 via INTRAVENOUS

## 2014-09-22 NOTE — Patient Instructions (Signed)
Return to clinic as scheduled for continued upper and lower complete denture fabrication. Dr. Enrique Sack

## 2014-09-22 NOTE — Progress Notes (Signed)
09/22/2014  Patient Name:   Jeffrey Hines Date of Birth:   December 19, 1945 Medical Record Number: [id number]  BP 109/68 mmHg  Pulse 72  Temp(Src) 98 F (36.7 C) (Oral)  [name] presents for start of upper and lower denture fabrication.  Exam: Patient is edentulous. Healing is progressing well. Discussed procedures involved in upper and lower denture fabrication and prognosis for successful ability to wear dentures. Price for dentures confirmed.  Patient agrees to proceed with upper and lower denture fabrication. Procedure:  Upper and lower denture primary impressions in alginate. Lab pour. To [facility] for upper and lower denture custom tray fabrication. RTC for upper and lower denture final impressions.  Lenn Cal, DDS

## 2014-09-22 NOTE — Telephone Encounter (Signed)
Called pt regarding appt with MD 4/19. LMOVM at home # pt to f/u on 4/25 with [place], [place]

## 2014-09-22 NOTE — Patient Instructions (Signed)
[facility] Discharge Instructions for Patients Receiving Chemotherapy  Today you received the following chemotherapy agents Taxol and Carboplatin  To help prevent nausea and vomiting after your treatment, we encourage you to take your nausea medication Compazine 10 mg every 6 hours as needed.  If you develop nausea and vomiting that is not controlled by your nausea medication, call the clinic.   BELOW ARE SYMPTOMS THAT SHOULD BE REPORTED IMMEDIATELY:  *FEVER GREATER THAN 100.5 F  *CHILLS WITH OR WITHOUT FEVER  NAUSEA AND VOMITING THAT IS NOT CONTROLLED WITH YOUR NAUSEA MEDICATION  *UNUSUAL SHORTNESS OF BREATH  *UNUSUAL BRUISING OR BLEEDING  TENDERNESS IN MOUTH AND THROAT WITH OR WITHOUT PRESENCE OF ULCERS  *URINARY PROBLEMS  *BOWEL PROBLEMS  UNUSUAL RASH Items with * indicate a potential emergency and should be followed up as soon as possible.  Feel free to call the clinic you have any questions or concerns. The clinic phone number is [phone].  Please show the [facility] at check-in to the Emergency Department and triage nurse.

## 2014-09-23 ENCOUNTER — Telehealth: Payer: Self-pay | Admitting: *Deleted

## 2014-09-23 ENCOUNTER — Ambulatory Visit
Admission: RE | Admit: 2014-09-23 | Discharge: 2014-09-23 | Disposition: A | Discharge status: Home / Self Care | Payer: Medicare Other | Source: Ambulatory Visit | Attending: Radiation Oncology | Admitting: Radiation Oncology

## 2014-09-23 ENCOUNTER — Ambulatory Visit: Payer: Self-pay | Admitting: Internal Medicine

## 2014-09-23 ENCOUNTER — Encounter: Payer: Self-pay | Admitting: Radiation Oncology

## 2014-09-23 VITALS — BP 114/68 | HR 105 | Temp 98.2°F | Resp 16 | Ht 71.0 in | Wt 181.8 lb

## 2014-09-23 DIAGNOSIS — C3490 Malignant neoplasm of unspecified part of unspecified bronchus or lung: Secondary | ICD-10-CM

## 2014-09-23 DIAGNOSIS — R52 Pain, unspecified: Secondary | ICD-10-CM | POA: Diagnosis not present

## 2014-09-23 DIAGNOSIS — Z79891 Long term (current) use of opiate analgesic: Secondary | ICD-10-CM | POA: Diagnosis not present

## 2014-09-23 DIAGNOSIS — Z510 Encounter for antineoplastic radiation therapy: Secondary | ICD-10-CM | POA: Diagnosis not present

## 2014-09-23 DIAGNOSIS — L598 Other specified disorders of the skin and subcutaneous tissue related to radiation: Secondary | ICD-10-CM | POA: Diagnosis not present

## 2014-09-23 DIAGNOSIS — Z8701 Personal history of pneumonia (recurrent): Secondary | ICD-10-CM | POA: Diagnosis not present

## 2014-09-23 DIAGNOSIS — C3492 Malignant neoplasm of unspecified part of left bronchus or lung: Secondary | ICD-10-CM | POA: Diagnosis not present

## 2014-09-23 DIAGNOSIS — C3412 Malignant neoplasm of upper lobe, left bronchus or lung: Secondary | ICD-10-CM | POA: Diagnosis not present

## 2014-09-23 MED ORDER — HYDROCODONE-ACETAMINOPHEN 5-325 MG PO TABS: 1.0000 | ORAL_TABLET | Freq: Four times a day (QID) | ORAL | Status: DC | PRN

## 2014-09-23 MED ORDER — BIAFINE EX EMUL
Freq: Once | CUTANEOUS | Status: AC
  Administered 2014-09-23: 11:00:00 via TOPICAL

## 2014-09-23 NOTE — Telephone Encounter (Signed)
-----   Message from Ardeen Garland, RN sent at 09/23/2014  9:54 AM EDT -----   ----- Message -----    From: Berton Mount    Sent: 09/23/2014   9:12 AM      To: Ardeen Garland, RN  Diane,  This patiient is being seen right now by Dr Julien Nordmann.  Can you get qualifying 02 sats for him for his oxygen?  Call me with questions.  Mandy at [facility]

## 2014-09-23 NOTE — Telephone Encounter (Signed)
Spoke with Jeffrey Hines at [facility] pt has an appt with [name] on 4/25 Sats will be obtained at this visit. Suggested home health eval if sats needed prior to this visit. Pt previously called requested not to come on 4/19 as he had recently been seen and has appt on 4/25. Called pt lmovm with this information.  POF sent to scheduling 4/18 to cancel pt appt.

## 2014-09-23 NOTE — Progress Notes (Signed)
  Radiation Oncology         [phone] ________________________________  Name: Jeffrey Hines MRN: [id number]  Date: 09/23/2014  DOB: 09-04-1945  Weekly Radiation Therapy Management  DIAGNOSIS: Stage II-B (T3, N0, M0) squamous cell carcinoma of the left upper central lung  Current Dose: 39.6 Gy     Planned Dose:  45 Gy  Narrative . . . . . . . . The patient presents for routine under treatment assessment.                                   The patient is without complaint. He is quite pleased with how well he is breathing. He is actually taken off his supplemental oxygen and sitting around the house.  he continues to take his pain medication to 3 times per day and I did refill this medication in the event that he needs some after he completes his therapy.                                 Set-up films were reviewed.                                 The chart was checked. Physical Findings. . .  height is '5\' 11"'$  (1.803 m) and weight is 181 lb 12.8 oz (82.464 kg). His oral temperature is 98.2 F (36.8 C). His blood pressure is 114/68 and his pulse is 105. His respiration is 16 and oxygen saturation is 99%. . The lungs are clear. The heart has a regular rhythm and rate. Impression . . . . . . . The patient is tolerating radiation. Plan . . . . . . . . . . . . Continue treatment as planned.  ________________________________   Blair Promise, PhD, MD

## 2014-09-23 NOTE — Progress Notes (Signed)
Jeffrey Hines has completed 22 fractions to his left chest.  He denies pain now but continues to have pain in the center of his chest after treatment.  He is taking norco 2-3 times a day.  He is requesting a refill.  He reports an occasional cough with green/tan sputum.  He denies hemoptysis.  He reports coughing more in the mornings.  He reports his breathing is better.  He had chemotherapy yesterday.  He has redness on his mid chest and left upper back.  He reports having itching.  He is using biafine and hydrocortisone cream.  He reports having fatigue.  He has been given a one month follow up card.  BP 114/68 mmHg  Pulse 105  Temp(Src) 98.2 F (36.8 C) (Oral)  Resp 16  Ht '5\' 11"'$  (1.803 m)  Wt 181 lb 12.8 oz (82.464 kg)  BMI 25.37 kg/m2  SpO2 99%

## 2014-09-24 ENCOUNTER — Ambulatory Visit
Admission: RE | Admit: 2014-09-24 | Discharge: 2014-09-24 | Disposition: A | Discharge status: Home / Self Care | Payer: Medicare Other | Source: Ambulatory Visit | Attending: Radiation Oncology | Admitting: Radiation Oncology

## 2014-09-24 DIAGNOSIS — Z79891 Long term (current) use of opiate analgesic: Secondary | ICD-10-CM | POA: Diagnosis not present

## 2014-09-24 DIAGNOSIS — L598 Other specified disorders of the skin and subcutaneous tissue related to radiation: Secondary | ICD-10-CM | POA: Diagnosis not present

## 2014-09-24 DIAGNOSIS — C3412 Malignant neoplasm of upper lobe, left bronchus or lung: Secondary | ICD-10-CM | POA: Diagnosis not present

## 2014-09-24 DIAGNOSIS — Z510 Encounter for antineoplastic radiation therapy: Secondary | ICD-10-CM | POA: Diagnosis not present

## 2014-09-24 DIAGNOSIS — C3492 Malignant neoplasm of unspecified part of left bronchus or lung: Secondary | ICD-10-CM | POA: Diagnosis not present

## 2014-09-24 DIAGNOSIS — Z8701 Personal history of pneumonia (recurrent): Secondary | ICD-10-CM | POA: Diagnosis not present

## 2014-09-24 DIAGNOSIS — R52 Pain, unspecified: Secondary | ICD-10-CM | POA: Diagnosis not present

## 2014-09-25 ENCOUNTER — Ambulatory Visit: Admission: RE | Admit: 2014-09-25 | Payer: Medicare Other | Source: Ambulatory Visit

## 2014-09-25 ENCOUNTER — Ambulatory Visit
Admission: RE | Admit: 2014-09-25 | Discharge: 2014-09-25 | Disposition: A | Discharge status: Home / Self Care | Payer: Medicare Other | Source: Ambulatory Visit | Attending: Radiation Oncology | Admitting: Radiation Oncology

## 2014-09-26 ENCOUNTER — Ambulatory Visit: Payer: Medicare Other

## 2014-09-26 ENCOUNTER — Ambulatory Visit
Admission: RE | Admit: 2014-09-26 | Discharge: 2014-09-26 | Disposition: A | Discharge status: Home / Self Care | Payer: Medicare Other | Source: Ambulatory Visit | Attending: Radiation Oncology | Admitting: Radiation Oncology

## 2014-09-26 DIAGNOSIS — R52 Pain, unspecified: Secondary | ICD-10-CM | POA: Diagnosis not present

## 2014-09-26 DIAGNOSIS — C3412 Malignant neoplasm of upper lobe, left bronchus or lung: Secondary | ICD-10-CM | POA: Diagnosis not present

## 2014-09-26 DIAGNOSIS — Z510 Encounter for antineoplastic radiation therapy: Secondary | ICD-10-CM | POA: Diagnosis not present

## 2014-09-26 DIAGNOSIS — Z79891 Long term (current) use of opiate analgesic: Secondary | ICD-10-CM | POA: Diagnosis not present

## 2014-09-26 DIAGNOSIS — C3492 Malignant neoplasm of unspecified part of left bronchus or lung: Secondary | ICD-10-CM | POA: Diagnosis not present

## 2014-09-26 DIAGNOSIS — Z8701 Personal history of pneumonia (recurrent): Secondary | ICD-10-CM | POA: Diagnosis not present

## 2014-09-26 DIAGNOSIS — L598 Other specified disorders of the skin and subcutaneous tissue related to radiation: Secondary | ICD-10-CM | POA: Diagnosis not present

## 2014-09-29 ENCOUNTER — Ambulatory Visit
Admission: RE | Admit: 2014-09-29 | Discharge: 2014-09-29 | Disposition: A | Discharge status: Home / Self Care | Payer: Medicare Other | Source: Ambulatory Visit | Attending: Radiation Oncology | Admitting: Radiation Oncology

## 2014-09-29 ENCOUNTER — Other Ambulatory Visit (HOSPITAL_BASED_OUTPATIENT_CLINIC_OR_DEPARTMENT_OTHER): Payer: Medicare Other

## 2014-09-29 ENCOUNTER — Ambulatory Visit: Admission: RE | Admit: 2014-09-29 | Payer: Medicare Other | Source: Ambulatory Visit

## 2014-09-29 ENCOUNTER — Ambulatory Visit: Payer: Medicare Other

## 2014-09-29 ENCOUNTER — Encounter: Payer: Self-pay | Admitting: Physician Assistant

## 2014-09-29 ENCOUNTER — Encounter: Payer: Self-pay | Admitting: Radiation Oncology

## 2014-09-29 ENCOUNTER — Telehealth: Payer: Self-pay | Admitting: Pulmonary Disease

## 2014-09-29 ENCOUNTER — Telehealth: Payer: Self-pay | Admitting: Medical Oncology

## 2014-09-29 ENCOUNTER — Ambulatory Visit (HOSPITAL_BASED_OUTPATIENT_CLINIC_OR_DEPARTMENT_OTHER): Payer: Medicare Other | Admitting: Physician Assistant

## 2014-09-29 ENCOUNTER — Telehealth: Payer: Self-pay | Admitting: Internal Medicine

## 2014-09-29 ENCOUNTER — Other Ambulatory Visit: Payer: Medicare Other

## 2014-09-29 VITALS — BP 105/58 | HR 73 | Temp 97.8°F | Resp 18 | Ht 71.0 in | Wt 183.6 lb

## 2014-09-29 DIAGNOSIS — Z510 Encounter for antineoplastic radiation therapy: Secondary | ICD-10-CM | POA: Diagnosis not present

## 2014-09-29 DIAGNOSIS — C3412 Malignant neoplasm of upper lobe, left bronchus or lung: Secondary | ICD-10-CM

## 2014-09-29 DIAGNOSIS — Z79891 Long term (current) use of opiate analgesic: Secondary | ICD-10-CM | POA: Diagnosis not present

## 2014-09-29 DIAGNOSIS — Z8701 Personal history of pneumonia (recurrent): Secondary | ICD-10-CM | POA: Diagnosis not present

## 2014-09-29 DIAGNOSIS — L598 Other specified disorders of the skin and subcutaneous tissue related to radiation: Secondary | ICD-10-CM | POA: Diagnosis not present

## 2014-09-29 DIAGNOSIS — C3492 Malignant neoplasm of unspecified part of left bronchus or lung: Secondary | ICD-10-CM

## 2014-09-29 DIAGNOSIS — R52 Pain, unspecified: Secondary | ICD-10-CM | POA: Diagnosis not present

## 2014-09-29 LAB — CBC WITH DIFFERENTIAL/PLATELET
BASO%: 0.2 % (ref 0.0–2.0)
Basophils Absolute: 0 10*3/uL (ref 0.0–0.1)
EOS%: 0.9 % (ref 0.0–7.0)
Eosinophils Absolute: 0 10*3/uL (ref 0.0–0.5)
HCT: 34 % — ABNORMAL LOW (ref 38.4–49.9)
HGB: 11.6 g/dL — ABNORMAL LOW (ref 13.0–17.1)
LYMPH%: 8.8 % — ABNORMAL LOW (ref 14.0–49.0)
MCH: 30.5 pg (ref 27.2–33.4)
MCHC: 34.1 g/dL (ref 32.0–36.0)
MCV: 89.5 fL (ref 79.3–98.0)
MONO#: 0.5 10*3/uL (ref 0.1–0.9)
MONO%: 10.4 % (ref 0.0–14.0)
NEUT#: 3.4 10*3/uL (ref 1.5–6.5)
NEUT%: 79.7 % — AB (ref 39.0–75.0)
PLATELETS: 130 10*3/uL — AB (ref 140–400)
RBC: 3.8 10*6/uL — ABNORMAL LOW (ref 4.20–5.82)
RDW: 14.5 % (ref 11.0–14.6)
WBC: 4.3 10*3/uL (ref 4.0–10.3)
lymph#: 0.4 10*3/uL — ABNORMAL LOW (ref 0.9–3.3)

## 2014-09-29 LAB — COMPREHENSIVE METABOLIC PANEL (CC13)
ALT: 21 U/L (ref 0–55)
AST: 19 U/L (ref 5–34)
Albumin: 3.7 g/dL (ref 3.5–5.0)
Alkaline Phosphatase: 65 U/L (ref 40–150)
Anion Gap: 11 mEq/L (ref 3–11)
BUN: 21.1 mg/dL (ref 7.0–26.0)
CALCIUM: 9 mg/dL (ref 8.4–10.4)
CO2: 24 mEq/L (ref 22–29)
CREATININE: 0.8 mg/dL (ref 0.7–1.3)
Chloride: 106 mEq/L (ref 98–109)
EGFR: >90 mL/min/{1.73_m2} (ref >90)
Glucose: 96 mg/dl (ref 70–140)
Potassium: 4.6 mEq/L (ref 3.5–5.1)
SODIUM: 142 meq/L (ref 136–145)
TOTAL PROTEIN: 6.6 g/dL (ref 6.4–8.3)
Total Bilirubin: 0.49 mg/dL (ref 0.20–1.20)

## 2014-09-29 NOTE — Telephone Encounter (Signed)
Spoke with Leafy Ro with [employer] She states that the pt is due to o2 qualification  He is scheduled with ov with Dr [place] Bells on 10/28/14 She wants to know if he can come in for 6MW sooner  Or we could just do regular walk? Please advise what you prefer, thanks!

## 2014-09-29 NOTE — Telephone Encounter (Signed)
Pt confirmed labs/ov per 04/25 POF, gave pt AVS and calendar..... KJ
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
## 2014-09-29 NOTE — Telephone Encounter (Signed)
O2 sat 97% on 2 liters  called to mandy at [employer].

## 2014-09-29 NOTE — Patient Instructions (Signed)
Follow-up in one month with a restaging CT scan of your chest to reevaluate your disease

## 2014-09-29 NOTE — Telephone Encounter (Signed)
Regular walk fine OK to come in sooner if needed

## 2014-09-30 ENCOUNTER — Encounter (HOSPITAL_COMMUNITY): Payer: Self-pay | Admitting: Dentistry

## 2014-09-30 NOTE — Telephone Encounter (Signed)
Called and spoke to [place] and informed her of the walk and appt. Leafy Ro stated it is up to the pt if he wants to come in sooner. Called and spoke to pt's wife. BQ does not have any sooner openings-pt's wife stated they will wait to see BQ opposed to seeing TP sooner. OV note updated to show pt needs to be qualified. Nothing further needed.

## 2014-09-30 NOTE — Progress Notes (Signed)
  Radiation Oncology         [phone] ________________________________  Name: Jeffrey Hines MRN: [id number]  Date: 09/29/2014  DOB: 1945-11-18  End of Treatment Note   ICD-9-CM ICD-10-CM    1. Squamous cell carcinoma of lung, stage II, left 162.9 C34.92     DIAGNOSIS: Stage II-B (T3, N0, M0) squamous cell carcinoma of the left upper central lung     Indication for treatment:  Pre-op along with radiosensitizing chemotherapy     Radiation treatment dates:   08/25/2014-09/29/2014  Site/dose:    left upper central lung, 45 Gy  Beams/energy:   3-D conformal 5 field beam arrangement  Narrative: The patient tolerated radiation treatment relatively well.   Breathing improved significantly during tx  Plan: The patient has completed radiation treatment. The patient will return to radiation oncology clinic for routine followup in one month. I advised them to call or return sooner if they have any questions or concerns related to their recovery or treatment.  -----------------------------------  Blair Promise, PhD, MD

## 2014-10-03 ENCOUNTER — Encounter: Payer: Self-pay | Admitting: *Deleted

## 2014-10-03 NOTE — Progress Notes (Signed)
[facility] Psychosocial Distress Screening Clinical Social Work  Clinical Social Work was referred by distress screening protocol.  The patient scored a 5 on the Psychosocial Distress Thermometer which indicates moderate distress. Clinical Social Worker attempted to contact patient by phone to assess for distress and other psychosocial needs. CSW familiar with patient from multidisciplinary thoracic oncology clinic.  ONCBCN DISTRESS SCREENING 09/01/2014  Screening Type Initial Screening  Distress experienced in past week (1-10) 5  Emotional problem type Nervousness/Anxiety;Adjusting to illness;Boredom  Physical Problem type Constipation/diarrhea;Tingling hands/feet  Physician notified of physical symptoms Yes  Referral to clinical psychology No  Referral to clinical social work No  Referral to dietition No  Referral to financial advocate No  Referral to support programs No  Referral to palliative care No    Clinical Social Worker follow up needed: Yes.    If yes, follow up plan: CSW left voicemail for patient to return call when convenient.  Polo Riley, MSW, LCSW, OSW-C Clinical Social Worker [facility] [phone]

## 2014-10-20 ENCOUNTER — Encounter: Payer: Self-pay | Admitting: Oncology

## 2014-10-22 ENCOUNTER — Ambulatory Visit
Admission: RE | Admit: 2014-10-22 | Discharge: 2014-10-22 | Disposition: A | Discharge status: Home / Self Care | Payer: Medicare Other | Source: Ambulatory Visit | Attending: Radiation Oncology | Admitting: Radiation Oncology

## 2014-10-22 VITALS — BP 116/68 | HR 90 | Temp 97.7°F | Resp 20 | Wt 189.5 lb

## 2014-10-22 DIAGNOSIS — C3492 Malignant neoplasm of unspecified part of left bronchus or lung: Secondary | ICD-10-CM

## 2014-10-22 MED ORDER — HYDROCODONE-ACETAMINOPHEN 5-325 MG PO TABS: 1.0000 | ORAL_TABLET | Freq: Four times a day (QID) | ORAL | Status: DC | PRN

## 2014-10-22 NOTE — Progress Notes (Signed)
Radiation Oncology         [phone] ________________________________  Name: Jeffrey Hines MRN: [id number]  Date: 10/22/2014  DOB: May 12, 1946  Follow-Up Visit Note  CC: Bryna Colander, MD   Diagnosis:   Stage II-B (T3, N0, M0) squamous cell carcinoma of the left upper central lung  Interval Since Last Radiation:   3 weeks   Narrative:  The patient returns today for routine follow-up.   Patient back for one month follow up completion of radiation for left upper central lung cancer on 09/29/14.Continues to have some esophageal discomfort. he has carafate which he isn't using on regular basis.Continued fatigue and shortness of breath.States he has green productive sputum. No chills or fever. No skin problems. Have some difficulty eating solid foods.                            ALLERGIES:  is allergic to prolixin and advair diskus.  Meds: Current Outpatient Prescriptions  Medication Sig Dispense Refill  . amLODipine (NORVASC) 10 MG tablet Take 1 tablet (10 mg total) by mouth daily. 30 tablet 1  . Ascorbic Acid (VITAMIN C) 1000 MG tablet Take 1,000 mg by mouth daily.    Marland Kitchen Bioflavonoid Products (BIOFLEX PO) Take by mouth daily.    . clonazePAM (KLONOPIN) 1 MG tablet Take 1 mg by mouth 2 (two) times daily.    Marland Kitchen dextromethorphan-guaiFENesin (MUCINEX DM) 30-600 MG per 12 hr tablet Take 1 tablet by mouth every 12 (twelve) hours.    . docusate sodium (COLACE) 100 MG capsule Take 100 mg by mouth 2 (two) times daily.    Marland Kitchen HYDROcodone-acetaminophen (NORCO) 5-325 MG per tablet Take 1-2 tablets by mouth every 6 (six) hours as needed for moderate pain. 40 tablet 0  . ibuprofen (ADVIL,MOTRIN) 200 MG tablet Take 400 mg by mouth every 6 (six) hours as needed for mild pain or moderate pain.    Marland Kitchen ipratropium-albuterol (DUONEB) 0.5-2.5 (3) MG/3ML SOLN Take 3 mLs by nebulization 4 (four) times daily as needed (Wheezing, shortness of breath).   5  . loratadine (CLARITIN) 10 MG  tablet Take 10 mg by mouth every 12 (twelve) hours.    . multivitamin-iron-minerals-folic acid (CENTRUM) chewable tablet Chew 1 tablet by mouth daily.    Marland Kitchen omeprazole (PRILOSEC) 20 MG capsule Take 20 mg by mouth daily.    . Saw Palmetto, Serenoa repens, (SAW PALMETTO PO) Take 2 tablets by mouth 3 (three) times daily.    . ST JOHNS WORT PO Take 1 tablet by mouth 3 (three) times daily.    . sucralfate (CARAFATE) 1 GM/10ML suspension Take 10 mLs (1 g total) by mouth 4 (four) times daily -  with meals and at bedtime. 420 mL 0  . SYMBICORT 160-4.5 MCG/ACT inhaler Inhale 2 puffs into the lungs 2 (two) times daily.    . vitamin B-12 (CYANOCOBALAMIN) 1000 MCG tablet Take 1,000 mcg by mouth daily.    Marland Kitchen emollient (BIAFINE) cream Apply topically as needed.    . prochlorperazine (COMPAZINE) 10 MG tablet Take 1 tablet (10 mg total) by mouth every 6 (six) hours as needed for nausea or vomiting. (Patient not taking: Reported on 10/22/2014) 30 tablet 0   No current facility-administered medications for this encounter.    Physical Findings: The patient is in no acute distress. Patient is alert and oriented.  weight is 189 lb 8 oz (85.957 kg). His temperature is 97.7 F (36.5  C). His blood pressure is 116/68 and his pulse is 90. His respiration is 20 and oxygen saturation is 99%. .  No significant changes.  Lungs are clear. Heart has regular rate and rhythm. No palpable cervical, supraclavicular, or axillary adenopathy. he continues to use supplemental oxygen at 2 L.  Lab Findings: Lab Results  Component Value Date   WBC 4.3 09/29/2014   HGB 11.6* 09/29/2014   HCT 34.0* 09/29/2014   MCV 89.5 09/29/2014   PLT 130* 09/29/2014    Radiographic Findings: No results found.  Impression:  The patient is recovering from the effects of radiation.    Plan:  CT scan later this month and follow-up with medical oncology. If the Patient has a favorable response to his treatment then he likely be referred to surgery  for consideration for resection. If The patient is not deemed a surgical candidate and he will return to radiation oncology for an additional week and a half of radiation therapy along with radiosensitizing chemotherapy.   This document serves as a record of services personally performed by Gery Pray, MD. It was created on his behalf by Jeralene Peters, a trained medical scribe. The creation of this record is based on the scribe's personal observations and the provider's statements to them. This document has been checked and approved by the attending provider.       ____________________________________ Blair Promise, PhD., MD

## 2014-10-22 NOTE — Progress Notes (Signed)
Patient back for one month follow up completion of radiation for left upper central lung cancer  on 09/29/14.Continues to  have some esophageal discomfort.he has carafate which he isn't using on regular basis.Continued fatigue and shortness of breath.States he has green productive sputum.No skin problems.

## 2014-10-28 ENCOUNTER — Ambulatory Visit (INDEPENDENT_AMBULATORY_CARE_PROVIDER_SITE_OTHER): Payer: Medicare Other | Admitting: Pulmonary Disease

## 2014-10-28 ENCOUNTER — Encounter: Payer: Self-pay | Admitting: Pulmonary Disease

## 2014-10-28 VITALS — BP 126/64 | HR 79 | Ht 71.0 in | Wt 186.0 lb

## 2014-10-28 DIAGNOSIS — J449 Chronic obstructive pulmonary disease, unspecified: Secondary | ICD-10-CM | POA: Diagnosis not present

## 2014-10-28 DIAGNOSIS — J9621 Acute and chronic respiratory failure with hypoxia: Secondary | ICD-10-CM | POA: Diagnosis not present

## 2014-10-28 DIAGNOSIS — C3492 Malignant neoplasm of unspecified part of left bronchus or lung: Secondary | ICD-10-CM | POA: Diagnosis not present

## 2014-10-28 DIAGNOSIS — J4489 Other specified chronic obstructive pulmonary disease: Secondary | ICD-10-CM

## 2014-10-28 DIAGNOSIS — J151 Pneumonia due to Pseudomonas: Secondary | ICD-10-CM

## 2014-10-28 NOTE — Patient Instructions (Signed)
Stop using oxygen during the daytime, we will check an oxygen level at night when you're sleeping, make sure you're not using oxygen at that time We will order a lung function test Keep taking Symbicort We will see you back in 3 months or sooner if needed

## 2014-10-28 NOTE — Progress Notes (Signed)
Subjective:    Patient ID: Jeffrey Hines, male    DOB: 1945/10/12, 69 y.o.   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 He was hospitalized for Pseudomonas pneumonia at the same time. He started treatment with chemotherapy and radiation therapy in 2016. He had stage IIB disease but because of the proximity of the lung mass to the orifice of the left mainstem bronchus it was determined that he was not healthy enough to undergo a pneumonectomy at the time. He was also found to have COPD. March 2016 pulmonary function test> ratio 50%, FEV1 L (49% predicted, 18% change with bronchodilator), total lung capacity 6.73 L (93% predicted), DLCO 11.13 (33% predicted).   HPI Chief Complaint  Patient presents with  . Follow-up    Pt is wanting a POC, wife also states pt may need a pft.  pt has 02 through [facility].     Jeffrey Hines has been getting chemo and radiation and has been breathing much better recently. He says his breathing is better than it has been since 2009.  He occasionally has a wheeze, but not often.  He says that he continues to take the Symbicort and the Albuterol.  No flares of bronchitis or pneumonia since he saw me the last time. He says that he down have chest congestion, bone pain, and lightheaded fairly predictably in the evenings.    He has been able to run his lawn mower and feels that his strength is coming back.  Past Medical History  Diagnosis Date  . COPD (chronic obstructive pulmonary disease)   . Closed head injury 1998  . Multiple rib fractures 1998    left side  . Shoulder dislocation 1998    left  . COPD with chronic bronchitis   . Tobacco abuse   . Hypertension   . Spontaneous pneumothorax 08/03/2014    Left side 1st time episode spontaneous pneumothorax associated with acute flare of COPD   . Hemoptysis 08/03/2014  . Cancer   . Post-obstruction pneumonia due to Pseudomonas aeruginosa 08/07/2014   Endobronchial mass causing LUL obstruction  . Anxiety   . Seizures   . Radiation 08/25/14-09/29/14    left upper central lung 45 Gy       Review of Systems  Constitutional: Negative for fever, chills and fatigue.  HENT: Negative for postnasal drip, rhinorrhea and sinus pressure.   Respiratory: Negative for cough, shortness of breath and wheezing.   Cardiovascular: Negative for chest pain, palpitations and leg swelling.       Objective:   Physical Exam  Filed Vitals:   10/28/14 1557  BP: 126/64  Pulse: 79  Height: '5\' 11"'$  (1.803 m)  Weight: 186 lb (84.369 kg)  SpO2: 97%   2L [place]  Gen: well appearing HENT: OP clear, TM's clear, neck supple PULM: CTA B, normal air movement CV: RRR, no mgr, trace edema GI: BS+, soft, nontender Derm: no cyanosis or rash Psyche: normal mood and affect  08/13/2014 CT PET imaging reviewed> there has been interval improvement in the size of left upper lobe lesion although there is still obstruction of the left upper lobe with some atelectasis, no clear airspace disease, minimal centrilobular emphysema noted Recent radiation oncology notes reviewed, plan for repeat CT this week      Assessment & Plan:   COPD with chronic bronchitis This has been a stable interval for Jeffrey Hines and his breathing has improved significantly with treatment of  his lung cancer. I am optimistic that his repeat lung function will show improvement after treatment with chemotherapy as I believe the left lung was nearly completely obstructed from the lung mass previously. Furthermore, his lung exam has changed today and is significantly improved compared to before.  Plan: Repeat lung function testing Continue Symbicort Follow-up 3 months or sooner if needed   Squamous cell carcinoma of lung, stage II As detailed above I am optimistic that his lung function testing will show improvement after chemotherapy and radiation therapy. Plan: Repeat CT scan this week Pulmonary  function testing  Hopeful surgical referral for potential resection if CT scan findings are promising   Post-obstruction pneumonia due to Pseudomonas aeruginosa Clinically resolved, follow-up imaging findings from CT scan later this week.   Acute on chronic respiratory failure with hypoxemia Today in clinic is ambulatory oxygenation was normal. I am hopeful that his nocturnal oxygenation is normal as well.  Plan: Discontinue use of oxygen during the daytime Check an overnight oximetry test on room air     Current outpatient prescriptions:  .  amLODipine (NORVASC) 10 MG tablet, Take 1 tablet (10 mg total) by mouth daily., Disp: 30 tablet, Rfl: 1 .  Ascorbic Acid (VITAMIN C) 1000 MG tablet, Take 1,000 mg by mouth daily., Disp: , Rfl:  .  Bioflavonoid Products (BIOFLEX PO), Take by mouth daily., Disp: , Rfl:  .  bisacodyl (DULCOLAX) 5 MG EC tablet, Take 5 mg by mouth daily as needed for moderate constipation., Disp: , Rfl:  .  clonazePAM (KLONOPIN) 1 MG tablet, Take 1 mg by mouth 2 (two) times daily., Disp: , Rfl:  .  dextromethorphan-guaiFENesin (MUCINEX DM) 30-600 MG per 12 hr tablet, Take 1 tablet by mouth every 12 (twelve) hours., Disp: , Rfl:  .  docusate sodium (COLACE) 100 MG capsule, Take 100 mg by mouth 2 (two) times daily., Disp: , Rfl:  .  emollient (BIAFINE) cream, Apply topically as needed., Disp: , Rfl:  .  HYDROcodone-acetaminophen (NORCO) 5-325 MG per tablet, Take 1-2 tablets by mouth every 6 (six) hours as needed for moderate pain., Disp: 40 tablet, Rfl: 0 .  ibuprofen (ADVIL,MOTRIN) 200 MG tablet, Take 400 mg by mouth every 6 (six) hours as needed for mild pain or moderate pain., Disp: , Rfl:  .  ipratropium-albuterol (DUONEB) 0.5-2.5 (3) MG/3ML SOLN, Take 3 mLs by nebulization 4 (four) times daily as needed (Wheezing, shortness of breath). , Disp: , Rfl: 5 .  loratadine (CLARITIN) 10 MG tablet, Take 10 mg by mouth every 12 (twelve) hours., Disp: , Rfl:  .   multivitamin-iron-minerals-folic acid (CENTRUM) chewable tablet, Chew 1 tablet by mouth daily., Disp: , Rfl:  .  omeprazole (PRILOSEC) 20 MG capsule, Take 20 mg by mouth daily., Disp: , Rfl:  .  Saw Palmetto, Serenoa repens, (SAW PALMETTO PO), Take 2 tablets by mouth 3 (three) times daily., Disp: , Rfl:  .  ST JOHNS WORT PO, Take 1 tablet by mouth 3 (three) times daily., Disp: , Rfl:  .  sucralfate (CARAFATE) 1 GM/10ML suspension, Take 10 mLs (1 g total) by mouth 4 (four) times daily -  with meals and at bedtime., Disp: 420 mL, Rfl: 0 .  SYMBICORT 160-4.5 MCG/ACT inhaler, Inhale 2 puffs into the lungs 2 (two) times daily., Disp: , Rfl:  .  vitamin B-12 (CYANOCOBALAMIN) 1000 MCG tablet, Take 1,000 mcg by mouth daily., Disp: , Rfl:

## 2014-10-28 NOTE — Assessment & Plan Note (Signed)
Clinically resolved, follow-up imaging findings from CT scan later this week.

## 2014-10-28 NOTE — Assessment & Plan Note (Signed)
This has been a stable interval for Jeffrey Hines and his breathing has improved significantly with treatment of his lung cancer. I am optimistic that his repeat lung function will show improvement after treatment with chemotherapy as I believe the left lung was nearly completely obstructed from the lung mass previously. Furthermore, his lung exam has changed today and is significantly improved compared to before.  Plan: Repeat lung function testing Continue Symbicort Follow-up 3 months or sooner if needed

## 2014-10-28 NOTE — Assessment & Plan Note (Signed)
Today in clinic is ambulatory oxygenation was normal. I am hopeful that his nocturnal oxygenation is normal as well.  Plan: Discontinue use of oxygen during the daytime Check an overnight oximetry test on room air

## 2014-10-28 NOTE — Assessment & Plan Note (Addendum)
As detailed above I am optimistic that his lung function testing will show improvement after chemotherapy and radiation therapy. Plan: Repeat CT scan this week Pulmonary function testing  Hopeful surgical referral for potential resection if CT scan findings are promising

## 2014-10-31 ENCOUNTER — Encounter (HOSPITAL_COMMUNITY): Payer: Self-pay

## 2014-10-31 ENCOUNTER — Other Ambulatory Visit (HOSPITAL_BASED_OUTPATIENT_CLINIC_OR_DEPARTMENT_OTHER): Payer: Medicare Other

## 2014-10-31 ENCOUNTER — Ambulatory Visit (HOSPITAL_COMMUNITY)
Admission: RE | Admit: 2014-10-31 | Discharge: 2014-10-31 | Disposition: A | Discharge status: Home / Self Care | Payer: Medicare Other | Source: Ambulatory Visit | Attending: Physician Assistant | Admitting: Physician Assistant

## 2014-10-31 DIAGNOSIS — M2550 Pain in unspecified joint: Secondary | ICD-10-CM | POA: Diagnosis not present

## 2014-10-31 DIAGNOSIS — R5383 Other fatigue: Secondary | ICD-10-CM | POA: Diagnosis not present

## 2014-10-31 DIAGNOSIS — Z9221 Personal history of antineoplastic chemotherapy: Secondary | ICD-10-CM | POA: Diagnosis not present

## 2014-10-31 DIAGNOSIS — J984 Other disorders of lung: Secondary | ICD-10-CM | POA: Diagnosis not present

## 2014-10-31 DIAGNOSIS — Z85118 Personal history of other malignant neoplasm of bronchus and lung: Secondary | ICD-10-CM | POA: Diagnosis not present

## 2014-10-31 DIAGNOSIS — C3412 Malignant neoplasm of upper lobe, left bronchus or lung: Secondary | ICD-10-CM | POA: Diagnosis present

## 2014-10-31 DIAGNOSIS — Z923 Personal history of irradiation: Secondary | ICD-10-CM | POA: Diagnosis not present

## 2014-10-31 DIAGNOSIS — R0602 Shortness of breath: Secondary | ICD-10-CM | POA: Diagnosis not present

## 2014-10-31 DIAGNOSIS — R918 Other nonspecific abnormal finding of lung field: Secondary | ICD-10-CM | POA: Diagnosis not present

## 2014-10-31 DIAGNOSIS — C3492 Malignant neoplasm of unspecified part of left bronchus or lung: Secondary | ICD-10-CM | POA: Diagnosis not present

## 2014-10-31 DIAGNOSIS — Z85110 Personal history of malignant carcinoid tumor of bronchus and lung: Secondary | ICD-10-CM | POA: Diagnosis not present

## 2014-10-31 DIAGNOSIS — J449 Chronic obstructive pulmonary disease, unspecified: Secondary | ICD-10-CM | POA: Diagnosis not present

## 2014-10-31 LAB — CBC WITH DIFFERENTIAL/PLATELET
BASO%: 0.5 % (ref 0.0–2.0)
Basophils Absolute: 0 10*3/uL (ref 0.0–0.1)
EOS%: 5 % (ref 0.0–7.0)
Eosinophils Absolute: 0.2 10*3/uL (ref 0.0–0.5)
HEMATOCRIT: 34.3 % — AB (ref 38.4–49.9)
HGB: 11.7 g/dL — ABNORMAL LOW (ref 13.0–17.1)
LYMPH%: 7.8 % — ABNORMAL LOW (ref 14.0–49.0)
MCH: 30.9 pg (ref 27.2–33.4)
MCHC: 34 g/dL (ref 32.0–36.0)
MCV: 90.9 fL (ref 79.3–98.0)
MONO#: 0.8 10*3/uL (ref 0.1–0.9)
MONO%: 17 % — ABNORMAL HIGH (ref 0.0–14.0)
NEUT%: 69.7 % (ref 39.0–75.0)
NEUTROS ABS: 3.2 10*3/uL (ref 1.5–6.5)
Platelets: 251 10*3/uL (ref 140–400)
RBC: 3.77 10*6/uL — AB (ref 4.20–5.82)
RDW: 16.5 % — ABNORMAL HIGH (ref 11.0–14.6)
WBC: 4.5 10*3/uL (ref 4.0–10.3)
lymph#: 0.4 10*3/uL — ABNORMAL LOW (ref 0.9–3.3)

## 2014-10-31 LAB — COMPREHENSIVE METABOLIC PANEL (CC13)
ALT: 15 U/L (ref 0–55)
AST: 19 U/L (ref 5–34)
Albumin: 3.5 g/dL (ref 3.5–5.0)
Alkaline Phosphatase: 83 U/L (ref 40–150)
Anion Gap: 9 mEq/L (ref 3–11)
BILIRUBIN TOTAL: 0.57 mg/dL (ref 0.20–1.20)
BUN: 18.9 mg/dL (ref 7.0–26.0)
CALCIUM: 8.8 mg/dL (ref 8.4–10.4)
CO2: 27 mEq/L (ref 22–29)
Chloride: 105 mEq/L (ref 98–109)
Creatinine: 0.8 mg/dL (ref 0.7–1.3)
EGFR: >90 mL/min/{1.73_m2} (ref >90)
Glucose: 101 mg/dl (ref 70–140)
Potassium: 4.5 mEq/L (ref 3.5–5.1)
Sodium: 141 mEq/L (ref 136–145)
TOTAL PROTEIN: 6.9 g/dL (ref 6.4–8.3)

## 2014-10-31 MED ORDER — IOHEXOL 300 MG/ML  SOLN
100.0000 mL | Freq: Once | INTRAMUSCULAR | Status: AC | PRN
  Administered 2014-10-31: 80 mL via INTRAVENOUS

## 2014-11-04 ENCOUNTER — Encounter: Payer: Self-pay | Admitting: Internal Medicine

## 2014-11-04 ENCOUNTER — Ambulatory Visit (HOSPITAL_BASED_OUTPATIENT_CLINIC_OR_DEPARTMENT_OTHER): Payer: Managed Care, Other (non HMO) | Admitting: Internal Medicine

## 2014-11-04 VITALS — BP 120/65 | HR 81 | Temp 98.0°F | Resp 18 | Ht 71.0 in | Wt 187.3 lb

## 2014-11-04 DIAGNOSIS — C3492 Malignant neoplasm of unspecified part of left bronchus or lung: Secondary | ICD-10-CM

## 2014-11-04 DIAGNOSIS — C3412 Malignant neoplasm of upper lobe, left bronchus or lung: Secondary | ICD-10-CM
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
## 2014-11-05 ENCOUNTER — Telehealth: Payer: Self-pay | Admitting: *Deleted

## 2014-11-05 NOTE — Telephone Encounter (Signed)
Oncology Nurse Navigator Documentation  Oncology Nurse Navigator Flowsheets 11/05/2014  Navigator Encounter Type Other.  Called patient with appt to see Dr. Roxan Hockey.  I spoke with wife.  She stated "she can't do that appt".  I stated I would call her back with another appt  Coordination of Care MD Appointments  Time Spent with Patient 15

## 2014-11-07 ENCOUNTER — Ambulatory Visit (HOSPITAL_COMMUNITY)
Admission: RE | Admit: 2014-11-07 | Discharge: 2014-11-07 | Disposition: A | Discharge status: Home / Self Care | Payer: Medicare Other | Source: Ambulatory Visit | Attending: Pulmonary Disease | Admitting: Pulmonary Disease

## 2014-11-07 DIAGNOSIS — J9620 Acute and chronic respiratory failure, unspecified whether with hypoxia or hypercapnia: Secondary | ICD-10-CM | POA: Insufficient documentation

## 2014-11-07 DIAGNOSIS — J9621 Acute and chronic respiratory failure with hypoxia: Secondary | ICD-10-CM

## 2014-11-07 DIAGNOSIS — J449 Chronic obstructive pulmonary disease, unspecified: Secondary | ICD-10-CM

## 2014-11-07 LAB — PULMONARY FUNCTION TEST
DL/VA % pred: 40 %
DL/VA: 1.87 ml/min/mmHg/L
DLCO cor % pred: 34 %
DLCO cor: 11.7 ml/min/mmHg
DLCO unc % pred: 31 %
DLCO unc: 10.62 ml/min/mmHg
FEF 25-75 Post: 0.96 L/sec
FEF 25-75 Pre: 0.74 L/sec
FEF2575-%Change-Post: 28 %
FEF2575-%Pred-Post: 36 %
FEF2575-%Pred-Pre: 28 %
FEV1-%Change-Post: 10 %
FEV1-%Pred-Post: 71 %
FEV1-%Pred-Pre: 64 %
FEV1-Post: 2.47 L
FEV1-Pre: 2.24 L
FEV1FVC-%Change-Post: 0 %
FEV1FVC-%Pred-Pre: 61 %
FEV6-%Change-Post: 5 %
FEV6-%Pred-Post: 103 %
FEV6-%Pred-Pre: 97 %
FEV6-Post: 4.55 L
FEV6-Pre: 4.3 L
FEV6FVC-%Change-Post: -3 %
FEV6FVC-%Pred-Post: 89 %
FEV6FVC-%Pred-Pre: 92 %
FVC-%Change-Post: 10 %
FVC-%Pred-Post: 115 %
FVC-%Pred-Pre: 104 %
FVC-Post: 5.38 L
FVC-Pre: 4.89 L
Post FEV1/FVC ratio: 46 %
Post FEV6/FVC ratio: 85 %
Pre FEV1/FVC ratio: 46 %
Pre FEV6/FVC Ratio: 88 %
RV % pred: 101 %
RV: 2.5 L
TLC % pred: 107 %
TLC: 7.79 L

## 2014-11-07 MED ORDER — ALBUTEROL SULFATE (2.5 MG/3ML) 0.083% IN NEBU
2.5000 mg | INHALATION_SOLUTION | Freq: Once | RESPIRATORY_TRACT | Status: AC
  Administered 2014-11-07: 2.5 mg via RESPIRATORY_TRACT

## 2014-11-10 ENCOUNTER — Encounter: Payer: Self-pay | Admitting: *Deleted

## 2014-11-10 NOTE — Progress Notes (Signed)
Oncology Nurse Navigator Documentation  Oncology Nurse Navigator Flowsheets 11/10/2014  Navigator Encounter Type Other/Care Coordination   Coordination of Care MD Appointments/I called [facility] to follow up with them regarding patient's appt with no answer. I had notified the office through EMR with no response. Patient's wife is unable to make appt on 6/10  and would like to re-schedule.  Will call back to office.   Time Spent with Patient 15

## 2014-11-11 ENCOUNTER — Encounter: Payer: Medicare Other | Admitting: Thoracic Surgery (Cardiothoracic Vascular Surgery)

## 2014-11-12 ENCOUNTER — Telehealth: Payer: Self-pay | Admitting: *Deleted

## 2014-11-12 NOTE — Telephone Encounter (Signed)
Oncology Nurse Navigator Documentation  Oncology Nurse Navigator Flowsheets 11/12/2014  Navigator Encounter Type Other/Phone call   Treatment Phase Treatment  Coordination of Care MD Appointments/I am confused about patients appt with [facility].  I called patient to clarify.  Left my name and phone number to call  Time Spent with Patient 15

## 2014-11-12 NOTE — Telephone Encounter (Signed)
Called [facility] to clarify appt Patient will be seen on 11/14/14 and patient states that he can be seen that day.

## 2014-11-13 ENCOUNTER — Telehealth: Payer: Self-pay

## 2014-11-13 ENCOUNTER — Other Ambulatory Visit: Payer: Self-pay | Admitting: Radiation Oncology

## 2014-11-13 DIAGNOSIS — C3492 Malignant neoplasm of unspecified part of left bronchus or lung: Secondary | ICD-10-CM

## 2014-11-13 MED ORDER — HYDROCODONE-ACETAMINOPHEN 5-325 MG PO TABS: 1.0000 | ORAL_TABLET | Freq: Four times a day (QID) | ORAL | Status: DC | PRN

## 2014-11-13 MED ORDER — SUCRALFATE 1 GM/10ML PO SUSP: 1.0000 g | Freq: Three times a day (TID) | ORAL | Status: DC

## 2014-11-13 NOTE — Telephone Encounter (Signed)
Spoke with pt, he is aware of results/recs.  Nothing further needed.

## 2014-11-13 NOTE — Telephone Encounter (Signed)
-----   Message from Juanito Doom, MD sent at 11/13/2014  3:18 PM EDT ----- A, Please let him know that his ONO showed his oxygen was low.  Please let him know he needs to keep O2 and use at night. Thanks [employer]

## 2014-11-14 ENCOUNTER — Institutional Professional Consult (permissible substitution) (INDEPENDENT_AMBULATORY_CARE_PROVIDER_SITE_OTHER): Payer: Medicare Other | Admitting: Thoracic Surgery (Cardiothoracic Vascular Surgery)

## 2014-11-14 ENCOUNTER — Encounter: Payer: Self-pay | Admitting: Thoracic Surgery (Cardiothoracic Vascular Surgery)

## 2014-11-14 ENCOUNTER — Encounter: Payer: Medicare Other | Admitting: Thoracic Surgery (Cardiothoracic Vascular Surgery)

## 2014-11-14 ENCOUNTER — Telehealth: Payer: Self-pay | Admitting: Oncology

## 2014-11-14 VITALS — BP 112/61 | HR 74 | Resp 20 | Ht 71.0 in | Wt 187.0 lb

## 2014-11-14 DIAGNOSIS — C3492 Malignant neoplasm of unspecified part of left bronchus or lung: Secondary | ICD-10-CM

## 2014-11-14 NOTE — Telephone Encounter (Signed)
Called Jeffrey Hines and let him know that the refill for Carafate has been sent to his pharmacy.  Advised him that the refill for hydrocodone/acetaminophen has been printed and is ready for pick up in the Radiation nursing area.  Jeffrey Hines verbalized agreement and will pick it up today.
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
## 2014-11-17 ENCOUNTER — Other Ambulatory Visit: Payer: Self-pay | Admitting: *Deleted

## 2014-11-17 DIAGNOSIS — C3412 Malignant neoplasm of upper lobe, left bronchus or lung: Secondary | ICD-10-CM

## 2014-11-19 ENCOUNTER — Encounter: Payer: Medicare Other | Admitting: Thoracic Surgery (Cardiothoracic Vascular Surgery)

## 2014-11-20 ENCOUNTER — Encounter (HOSPITAL_COMMUNITY): Payer: Self-pay

## 2014-11-20 ENCOUNTER — Encounter (HOSPITAL_COMMUNITY)
Admission: RE | Admit: 2014-11-20 | Discharge: 2014-11-20 | Disposition: A | Discharge status: Home / Self Care | Payer: Medicare Other | Source: Ambulatory Visit | Attending: Thoracic Surgery (Cardiothoracic Vascular Surgery) | Admitting: Thoracic Surgery (Cardiothoracic Vascular Surgery)

## 2014-11-20 VITALS — BP 118/71 | HR 71 | Temp 97.9°F | Resp 20 | Ht 71.0 in | Wt 185.7 lb

## 2014-11-20 DIAGNOSIS — Z87891 Personal history of nicotine dependence: Secondary | ICD-10-CM | POA: Diagnosis not present

## 2014-11-20 DIAGNOSIS — Z01818 Encounter for other preprocedural examination: Secondary | ICD-10-CM | POA: Insufficient documentation

## 2014-11-20 DIAGNOSIS — C3492 Malignant neoplasm of unspecified part of left bronchus or lung: Secondary | ICD-10-CM | POA: Diagnosis not present

## 2014-11-20 DIAGNOSIS — C3412 Malignant neoplasm of upper lobe, left bronchus or lung: Secondary | ICD-10-CM

## 2014-11-20 HISTORY — DX: Low back pain, unspecified: M54.50

## 2014-11-20 HISTORY — DX: Other chronic pain: G89.29

## 2014-11-20 HISTORY — DX: Reserved for inherently not codable concepts without codable children: IMO0001

## 2014-11-20 HISTORY — DX: Low back pain: M54.5

## 2014-11-20 HISTORY — DX: Gastro-esophageal reflux disease without esophagitis: K21.9

## 2014-11-20 LAB — CBC
HEMATOCRIT: 38.9 % — AB (ref 39.0–52.0)
HEMOGLOBIN: 13 g/dL (ref 13.0–17.0)
MCH: 30.8 pg (ref 26.0–34.0)
MCHC: 33.4 g/dL (ref 30.0–36.0)
MCV: 92.2 fL (ref 78.0–100.0)
Platelets: 195 10*3/uL (ref 150–400)
RBC: 4.22 MIL/uL (ref 4.22–5.81)
RDW: 14.1 % (ref 11.5–15.5)
WBC: 4.9 10*3/uL (ref 4.0–10.5)

## 2014-11-20 LAB — PROTIME-INR
INR: 1.13 (ref 0.00–1.49)
PROTHROMBIN TIME: 14.6 s (ref 11.6–15.2)

## 2014-11-20 LAB — COMPREHENSIVE METABOLIC PANEL
ALBUMIN: 3.8 g/dL (ref 3.5–5.0)
ALK PHOS: 67 U/L (ref 38–126)
ALT: 23 U/L (ref 17–63)
AST: 26 U/L (ref 15–41)
Anion gap: 7 (ref 5–15)
BILIRUBIN TOTAL: 0.5 mg/dL (ref 0.3–1.2)
BUN: 18 mg/dL (ref 6–20)
CHLORIDE: 107 mmol/L (ref 101–111)
CO2: 27 mmol/L (ref 22–32)
Calcium: 9 mg/dL (ref 8.9–10.3)
Creatinine, Ser: 0.77 mg/dL (ref 0.61–1.24)
GFR calc Af Amer: >60 mL/min (ref >60)
GFR calc non Af Amer: >60 mL/min (ref >60)
Glucose, Bld: 111 mg/dL — ABNORMAL HIGH (ref 65–99)
Potassium: 3.9 mmol/L (ref 3.5–5.1)
SODIUM: 141 mmol/L (ref 135–145)
TOTAL PROTEIN: 7.1 g/dL (ref 6.5–8.1)

## 2014-11-20 LAB — APTT: APTT: 36 s (ref 24–37)

## 2014-11-20 NOTE — Pre-Procedure Instructions (Signed)
    Jeffrey Hines  11/20/2014      [id number] [place] [place] [address] [place] [address]Phone: [phone]: [phone]   Your procedure is scheduled on Monday, June 20th, 2016 at 7:30 AM  [facility]  Call this number if you have problems the morning of surgery:  [phone]   Remember:  Do not eat food or drink liquids after midnight.   Take these medicines the morning of surgery with A SIP OF WATER Amlodipine (Norvasc), Clonazepam (Klonopin), Hydrocodone-acetaminophen (Norco), ipratropium-albuterol (Duoneb), loratadine (claritin), Omeprazole (Prilosec), symbicort inhaler.  Stop taking: Aspirin, Aleve, Naproxen, Ibuprofen, BC's, Goody's, Fish Oil, and herbal medications.    Do not wear jewelry, make-up or nail polish.  Do not wear lotions, powders, or colognes.  You may wear deodorant.  Do not shave 48 hours prior to surgery.  Men may shave face and neck.  Do not bring valuables to the hospital.  [facility] or [facility].  Contacts, dentures or bridgework may not be worn into surgery.  Leave your suitcase in the car.  After surgery it may be brought to your room.  For patients admitted to the hospital, discharge time will be determined by your treatment team.  Patients discharged the day of surgery will not be allowed to drive home.    Special instructions:  See attached.   Please read over the following fact sheets that you were given. Pain Booklet, Coughing and Deep Breathing and Surgical Site Infection Prevention

## 2014-11-20 NOTE — Progress Notes (Signed)
PCP- Dr. Carlos Levering at [facility]  Cardiologist - denies  EKG- 07/2014 in Epic  Echo- denies  Cardiac Cath- denies  Stress Test - requested from [place] in [place]

## 2014-11-21 NOTE — Progress Notes (Signed)
Re-requested cardiac hx/tests from [place] in [place].  Stated they would promptly fax their records over.

## 2014-11-25 NOTE — Progress Notes (Signed)
Notified pt of new arrival time of 0630-verbalized understanding.

## 2014-11-26 ENCOUNTER — Ambulatory Visit (HOSPITAL_COMMUNITY)
Admission: RE | Admit: 2014-11-26 | Discharge: 2014-11-26 | Disposition: A | Discharge status: Home / Self Care | Payer: Medicare Other | Source: Ambulatory Visit | Attending: Thoracic Surgery (Cardiothoracic Vascular Surgery) | Admitting: Thoracic Surgery (Cardiothoracic Vascular Surgery)

## 2014-11-26 ENCOUNTER — Encounter (HOSPITAL_COMMUNITY): Payer: Self-pay | Admitting: *Deleted

## 2014-11-26 ENCOUNTER — Encounter (HOSPITAL_COMMUNITY)
Admission: RE | Disposition: A | Discharge status: Home / Self Care | Payer: Self-pay | Source: Ambulatory Visit | Attending: Thoracic Surgery (Cardiothoracic Vascular Surgery)

## 2014-11-26 ENCOUNTER — Ambulatory Visit (HOSPITAL_COMMUNITY): Payer: Medicare Other | Admitting: Certified Registered Nurse Anesthetist

## 2014-11-26 ENCOUNTER — Ambulatory Visit (HOSPITAL_COMMUNITY): Payer: Medicare Other

## 2014-11-26 DIAGNOSIS — Z87891 Personal history of nicotine dependence: Secondary | ICD-10-CM | POA: Diagnosis not present

## 2014-11-26 DIAGNOSIS — I10 Essential (primary) hypertension: Secondary | ICD-10-CM | POA: Insufficient documentation

## 2014-11-26 DIAGNOSIS — C3412 Malignant neoplasm of upper lobe, left bronchus or lung: Secondary | ICD-10-CM | POA: Diagnosis not present

## 2014-11-26 DIAGNOSIS — Z85118 Personal history of other malignant neoplasm of bronchus and lung: Secondary | ICD-10-CM | POA: Diagnosis not present

## 2014-11-26 DIAGNOSIS — J449 Chronic obstructive pulmonary disease, unspecified: Secondary | ICD-10-CM | POA: Diagnosis not present

## 2014-11-26 DIAGNOSIS — D1432 Benign neoplasm of left bronchus and lung: Secondary | ICD-10-CM | POA: Insufficient documentation

## 2014-11-26 DIAGNOSIS — Z01811 Encounter for preprocedural respiratory examination: Secondary | ICD-10-CM | POA: Diagnosis not present

## 2014-11-26 DIAGNOSIS — K219 Gastro-esophageal reflux disease without esophagitis: Secondary | ICD-10-CM | POA: Diagnosis not present

## 2014-11-26 DIAGNOSIS — D0222 Carcinoma in situ of left bronchus and lung: Secondary | ICD-10-CM | POA: Diagnosis not present

## 2014-11-26 DIAGNOSIS — M545 Low back pain: Secondary | ICD-10-CM | POA: Diagnosis not present

## 2014-11-26 HISTORY — PX: VIDEO BRONCHOSCOPY: SHX5072

## 2014-11-26 SURGERY — BRONCHOSCOPY, VIDEO-ASSISTED
Anesthesia: General

## 2014-11-26 MED ORDER — ONDANSETRON HCL 4 MG/2ML IJ SOLN
INTRAMUSCULAR | Status: DC | PRN
  Administered 2014-11-26: 4 mg via INTRAVENOUS

## 2014-11-26 MED ORDER — EPINEPHRINE HCL 1 MG/ML IJ SOLN
INTRAMUSCULAR | Status: AC
  Filled 2014-11-26: qty 3

## 2014-11-26 MED ORDER — DEXAMETHASONE SODIUM PHOSPHATE 10 MG/ML IJ SOLN
INTRAMUSCULAR | Status: DC | PRN
  Administered 2014-11-26: 10 mg via INTRAVENOUS

## 2014-11-26 MED ORDER — MEPERIDINE HCL 25 MG/ML IJ SOLN: 6.2500 mg | INTRAMUSCULAR | Status: DC | PRN

## 2014-11-26 MED ORDER — PROPOFOL 10 MG/ML IV BOLUS
INTRAVENOUS | Status: DC | PRN
  Administered 2014-11-26 (×3): 50 mg via INTRAVENOUS
  Administered 2014-11-26: 150 mg via INTRAVENOUS

## 2014-11-26 MED ORDER — LACTATED RINGERS IV SOLN
INTRAVENOUS | Status: DC | PRN
  Administered 2014-11-26 (×2): via INTRAVENOUS

## 2014-11-26 MED ORDER — MIDAZOLAM HCL 5 MG/5ML IJ SOLN
INTRAMUSCULAR | Status: DC | PRN
  Administered 2014-11-26: 2 mg via INTRAVENOUS

## 2014-11-26 MED ORDER — FENTANYL CITRATE (PF) 250 MCG/5ML IJ SOLN
INTRAMUSCULAR | Status: AC
  Filled 2014-11-26: qty 5

## 2014-11-26 MED ORDER — EPINEPHRINE HCL 1 MG/ML IJ SOLN
INTRAMUSCULAR | Status: DC | PRN
  Administered 2014-11-26: 1 mg

## 2014-11-26 MED ORDER — ALBUTEROL SULFATE HFA 108 (90 BASE) MCG/ACT IN AERS
INHALATION_SPRAY | RESPIRATORY_TRACT | Status: DC | PRN
  Administered 2014-11-26: 2 via RESPIRATORY_TRACT

## 2014-11-26 MED ORDER — PROMETHAZINE HCL 25 MG/ML IJ SOLN: 6.2500 mg | INTRAMUSCULAR | Status: DC | PRN

## 2014-11-26 MED ORDER — PROPOFOL 10 MG/ML IV BOLUS
INTRAVENOUS | Status: AC
  Filled 2014-11-26: qty 20

## 2014-11-26 MED ORDER — FENTANYL CITRATE (PF) 100 MCG/2ML IJ SOLN
INTRAMUSCULAR | Status: DC | PRN
  Administered 2014-11-26: 50 ug via INTRAVENOUS
  Administered 2014-11-26: 100 ug via INTRAVENOUS
  Administered 2014-11-26 (×6): 50 ug via INTRAVENOUS

## 2014-11-26 MED ORDER — LIDOCAINE HCL (CARDIAC) 20 MG/ML IV SOLN
INTRAVENOUS | Status: DC | PRN
  Administered 2014-11-26: 20 mg via INTRAVENOUS

## 2014-11-26 MED ORDER — FENTANYL CITRATE (PF) 100 MCG/2ML IJ SOLN: 25.0000 ug | INTRAMUSCULAR | Status: DC | PRN

## 2014-11-26 MED ORDER — MIDAZOLAM HCL 2 MG/2ML IJ SOLN: 0.5000 mg | Freq: Once | INTRAMUSCULAR | Status: DC | PRN

## 2014-11-26 MED ORDER — SUCCINYLCHOLINE CHLORIDE 20 MG/ML IJ SOLN
INTRAMUSCULAR | Status: DC | PRN
  Administered 2014-11-26: 100 mg via INTRAVENOUS

## 2014-11-26 MED ORDER — MIDAZOLAM HCL 2 MG/2ML IJ SOLN
INTRAMUSCULAR | Status: AC
  Filled 2014-11-26: qty 2

## 2014-11-26 SURGICAL SUPPLY — 26 items
BLOCK BITE 60FR ADLT L/F BLUE (MISCELLANEOUS) ×2 IMPLANT
BRUSH CYTOL CELLEBRITY 1.5X140 (MISCELLANEOUS) IMPLANT
CANISTER SUCTION 2500CC (MISCELLANEOUS) ×2 IMPLANT
CONT SPEC 4OZ CLIKSEAL STRL BL (MISCELLANEOUS) ×12 IMPLANT
COTTONBALL LRG STERILE PKG (GAUZE/BANDAGES/DRESSINGS) IMPLANT
COVER TABLE BACK 60X90 (DRAPES) ×2 IMPLANT
FILTER STRAW FLUID ASPIR (MISCELLANEOUS) IMPLANT
FORCEPS BIOP RJ4 1.8 (CUTTING FORCEPS) ×2 IMPLANT
FORCEPS RADIAL JAW LRG 4 PULM (INSTRUMENTS) ×1 IMPLANT
GAUZE SPONGE 4X4 12PLY STRL (GAUZE/BANDAGES/DRESSINGS) ×2 IMPLANT
GLOVE SURG SIGNA 7.5 PF LTX (GLOVE) ×2 IMPLANT
KIT CLEAN ENDO COMPLIANCE (KITS) ×2 IMPLANT
KIT ROOM TURNOVER OR (KITS) ×2 IMPLANT
NEEDLE 22X1 1/2 (OR ONLY) (NEEDLE) IMPLANT
NEEDLE BIOPSY TRANSBRONCH 21G (NEEDLE) IMPLANT
NS IRRIG 1000ML POUR BTL (IV SOLUTION) ×2 IMPLANT
PAD ARMBOARD 7.5X6 YLW CONV (MISCELLANEOUS) ×4 IMPLANT
RADIAL JAW LRG 4 PULMONARY (INSTRUMENTS) ×1
SOLUTION ANTI FOG 6CC (MISCELLANEOUS) ×2 IMPLANT
SYR 20ML ECCENTRIC (SYRINGE) ×2 IMPLANT
SYR 5ML LL (SYRINGE) ×2 IMPLANT
SYR 5ML LUER SLIP (SYRINGE) ×2 IMPLANT
SYR CONTROL 10ML LL (SYRINGE) IMPLANT
TOWEL OR 17X24 6PK STRL BLUE (TOWEL DISPOSABLE) ×2 IMPLANT
TRAP SPECIMEN MUCOUS 40CC (MISCELLANEOUS) ×4 IMPLANT
TUBE CONNECTING 20X1/4 (TUBING) ×2 IMPLANT

## 2014-11-26 NOTE — Brief Op Note (Signed)
11/26/2014  9:18 AM  PATIENT:  [name]  69 y.o. male  PRE-OPERATIVE DIAGNOSIS:  LUL SQUAMOUS CELL CANCER  POST-OPERATIVE DIAGNOSIS:   LUL SQUAMOUS CELL CANCER  PROCEDURE:  Procedure(s): VIDEO BRONCHOSCOPY (N/A) with biopsies  SURGEON:  Surgeon(s) and Role:    * Melrose Nakayama, MD - Primary  ANESTHESIA:   general  EBL:  Total I/O In: 1000 [I.V.:1000] Out: -   BLOOD ADMINISTERED:none  DRAINS: none   LOCAL MEDICATIONS USED:  NONE  SPECIMEN:  Source of Specimen:  biopsies  DISPOSITION OF SPECIMEN:  PATHOLOGY  PLAN OF CARE: Discharge to home after PACU  PATIENT DISPOSITION:  PACU - hemodynamically stable.   Delay start of Pharmacological VTE agent (>24hrs) due to surgical blood loss or risk of bleeding: not applicable  Radiation changes Left upper lobe and distal main stem bronchus radiation changes. Narrowing of LUL bronchus

## 2014-11-26 NOTE — Discharge Instructions (Signed)
Do not drive or engage in heavy physical activity for 24 hours  You may cough up small amounts of blood over the next few days  Call [phone] if you develop chest pain, shortness of breath, fever > 101 or cough up large amounts of blood (> 1 tablespoon)  My office will contact you with a follow up appointment to discuss the biopsy results

## 2014-11-26 NOTE — Op Note (Signed)
NAMEALDIN, DREES              ACCOUNT NO.:  [id number]  MEDICAL RECORD NO.:  [id number]  LOCATION:  [facility]                         FACILITY:  [facility]  PHYSICIAN:  Revonda Standard. Roxan Hockey, M.D.DATE OF BIRTH:  Aug 09, 1945  DATE OF PROCEDURE:  11/26/2014 DATE OF DISCHARGE:                              OPERATIVE REPORT   POSTOPERATIVE DIAGNOSIS:  Stage IIB squamous cell carcinoma left upper lobe status post neoadjuvant chemoradiation.  POSTOPERATIVE DIAGNOSIS:  Stage IIB squamous cell carcinoma left upper lobe status post neoadjuvant chemoradiation.  PROCEDURE:  Video bronchoscopy with biopsies.  SURGEON:  Revonda Standard. Roxan Hockey, M.D.  ANESTHESIA:  General.  FINDINGS:  Radiation changes of the left upper lobe and distal left mainstem bronchi.  Biopsies taken nearing of the left upper lobe bronchus.  CLINICAL NOTE:  Jeffrey Hines is a 69 year old gentleman with a history of tobacco abuse who recently was diagnosed with stage IIB squamous cell carcinoma of the left upper lobe.  He received neoadjuvant chemotherapy and radiation and had a good clinical response.  Scans revealed some residual mass present and some narrowing of the left upper lobe bronchi. The patient was advised to undergo bronchoscopy to assess for possible resectability.  The indications, risks, benefits, and alternatives were discussed in detail with the patient.  He understood and accepted the risks and agreed to proceed.  OPERATIVE NOTE:  Mr. Minniefield was brought the operating room on November 26, 2014.  He had induction of general anesthesia and was intubated. Flexible fiberoptic bronchoscopy was performed via the endotracheal tube.  There were thick secretions bilaterally.  The trachea and right bronchial tree were within normal limits.  No endobronchial lesions were seen.  The scope then was advanced down the left mainstem bronchus to the bifurcation.  Beyond its origin, the left lower lobe bronchus appeared  relatively normal.  There was likely radiation change of the bronchial mucosa, the distal left mainstem and the proximal left upper lobe bronchus, there was significant narrowing of the anterior apical and posterior segmental bronchi with relative sparing of the lingular bronchi.  Biopsies then were taken from the left upper and lower lobe bronchi, the left mainstem carina and the distal left mainstem bronchus. Dilute epinephrine was applied to help with bleeding.  After biopsies were obtained from these areas, a final inspection was made and there was no ongoing bleeding.  The bronchoscope was removed.  The patient was extubated in the operating room, taken to the postanesthetic care unit in good condition.     Revonda Standard Roxan Hockey, M.D.     SCH/MEDQ  D:  11/26/2014  T:  11/26/2014  Job:  [id number]

## 2014-11-26 NOTE — Anesthesia Preprocedure Evaluation (Addendum)
Anesthesia Evaluation  Patient identified by MRN, date of birth, ID band Patient awake    Reviewed: Allergy & Precautions, NPO status , Patient's Chart, lab work & pertinent test results  History of Anesthesia Complications Negative for: history of anesthetic complications  Airway Mallampati: I  TM Distance: >3 FB Neck ROM: Full    Dental  (+) Dental Advisory Given, Edentulous Upper, Edentulous Lower   Pulmonary COPD COPD inhaler, former smoker (quit 2/16),  LUL Lung cancer: XRT breath sounds clear to auscultation        Cardiovascular hypertension, Pt. on medications - anginaRhythm:Regular Rate:Normal  '12 Myoview: no ischemia, EF 73%   Neuro/Psych    GI/Hepatic Neg liver ROS, GERD-  Medicated and Controlled,  Endo/Other  negative endocrine ROS  Renal/GU negative Renal ROS     Musculoskeletal   Abdominal   Peds  Hematology negative hematology ROS (+)   Anesthesia Other Findings   Reproductive/Obstetrics                           Anesthesia Physical Anesthesia Plan  ASA: III  Anesthesia Plan: General   Post-op Pain Management:    Induction: Intravenous  Airway Management Planned: Oral ETT  Additional Equipment:   Intra-op Plan:   Post-operative Plan: Extubation in OR  Informed Consent: I have reviewed the patients History and Physical, chart, labs and discussed the procedure including the risks, benefits and alternatives for the proposed anesthesia with the patient or authorized representative who has indicated his/her understanding and acceptance.     Plan Discussed with: CRNA and Surgeon  Anesthesia Plan Comments: (Plan routine monitors, GETA)        Anesthesia Quick Evaluation

## 2014-11-26 NOTE — Anesthesia Postprocedure Evaluation (Signed)
  Anesthesia Post-op Note  Patient: Jeffrey Hines  Procedure(s) Performed: Procedure(s): VIDEO BRONCHOSCOPY with multiple biopsies (N/A)  Patient Location: PACU  Anesthesia Type:General  Level of Consciousness: awake, alert , oriented and patient cooperative  Airway and Oxygen Therapy: Patient Spontanous Breathing  Post-op Pain: none  Post-op Assessment: Post-op Vital signs reviewed, Patient's Cardiovascular Status Stable, Respiratory Function Stable, Patent Airway, No signs of Nausea or vomiting and Pain level controlled              Post-op Vital Signs: Reviewed and stable  Last Vitals:  Filed Vitals:   11/26/14 1118  BP: 121/69  Pulse: 85  Temp:   Resp: 16    Complications: No apparent anesthesia complications
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
## 2014-11-26 NOTE — Interval H&P Note (Signed)
History and Physical Interval Note:  11/26/2014 8:12 AM  Jeffrey Hines  has presented today for surgery, with the diagnosis of LUL SQUAMOUS CELL CANCER  The various methods of treatment have been discussed with the patient and family. After consideration of risks, benefits and other options for treatment, the patient has consented to  Procedure(s): VIDEO BRONCHOSCOPY (N/A) as a surgical intervention .  The patient's history has been reviewed, patient examined, no change in status, stable for surgery.  I have reviewed the patient's chart and labs.  Questions were answered to the patient's satisfaction.     Melrose Nakayama

## 2014-11-26 NOTE — Anesthesia Procedure Notes (Signed)
Procedure Name: Intubation Date/Time: 11/26/2014 8:32 AM Performed by: Shirlyn Goltz Pre-anesthesia Checklist: Patient identified, Emergency Drugs available, Suction available and Patient being monitored Patient Re-evaluated:Patient Re-evaluated prior to inductionOxygen Delivery Method: Circle system utilized Preoxygenation: Pre-oxygenation with 100% oxygen Intubation Type: IV induction Ventilation: Mask ventilation without difficulty and Oral airway inserted - appropriate to patient size Laryngoscope Size: Sabra Heck and 3 Grade View: Grade I Tube type: Oral Tube size: 8.5 mm Number of attempts: 1 Airway Equipment and Method: Stylet Placement Confirmation: ETT inserted through vocal cords under direct vision,  positive ETCO2 and breath sounds checked- equal and bilateral Secured at: 21 cm Tube secured with: Tape Dental Injury: Teeth and Oropharynx as per pre-operative assessment

## 2014-11-26 NOTE — Transfer of Care (Signed)
Immediate Anesthesia Transfer of Care Note  Patient: Jeffrey Hines  Procedure(s) Performed: Procedure(s): VIDEO BRONCHOSCOPY with multiple biopsies (N/A)  Patient Location: PACU  Anesthesia Type:General  Level of Consciousness: awake, alert , oriented and patient cooperative  Airway & Oxygen Therapy: Patient Spontanous Breathing and Patient connected to face mask oxygen  Post-op Assessment: Report given to RN, Post -op Vital signs reviewed and stable, Patient moving all extremities and Patient moving all extremities X 4  Post vital signs: Reviewed and stable  Last Vitals:  Filed Vitals:   11/26/14 0632  BP: 114/76  Pulse: 69  Temp: 36.5 C  Resp: 20    Complications: No apparent anesthesia complications

## 2014-11-27 ENCOUNTER — Encounter (HOSPITAL_COMMUNITY): Payer: Self-pay | Admitting: Thoracic Surgery (Cardiothoracic Vascular Surgery)

## 2014-12-01 ENCOUNTER — Ambulatory Visit (INDEPENDENT_AMBULATORY_CARE_PROVIDER_SITE_OTHER): Payer: Medicare Other | Admitting: Thoracic Surgery (Cardiothoracic Vascular Surgery)

## 2014-12-01 ENCOUNTER — Encounter: Payer: Self-pay | Admitting: Thoracic Surgery (Cardiothoracic Vascular Surgery)

## 2014-12-01 ENCOUNTER — Other Ambulatory Visit: Payer: Self-pay | Admitting: *Deleted

## 2014-12-01 VITALS — BP 114/77 | HR 75 | Resp 16 | Ht 71.0 in | Wt 187.0 lb

## 2014-12-01 DIAGNOSIS — C3492 Malignant neoplasm of unspecified part of left bronchus or lung: Secondary | ICD-10-CM | POA: Diagnosis not present

## 2014-12-01 DIAGNOSIS — Z9889 Other specified postprocedural states: Secondary | ICD-10-CM
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
## 2014-12-11 ENCOUNTER — Encounter (HOSPITAL_COMMUNITY)
Admission: RE | Admit: 2014-12-11 | Discharge: 2014-12-11 | Disposition: A | Discharge status: Home / Self Care | Payer: Medicare Other | Source: Ambulatory Visit | Attending: Thoracic Surgery (Cardiothoracic Vascular Surgery) | Admitting: Thoracic Surgery (Cardiothoracic Vascular Surgery)

## 2014-12-11 ENCOUNTER — Other Ambulatory Visit: Payer: Self-pay

## 2014-12-11 ENCOUNTER — Encounter: Payer: Self-pay | Admitting: Pulmonary Disease

## 2014-12-11 ENCOUNTER — Encounter (HOSPITAL_COMMUNITY): Payer: Self-pay

## 2014-12-11 VITALS — BP 113/71 | HR 87 | Temp 98.3°F | Resp 18 | Ht 71.5 in | Wt 186.0 lb

## 2014-12-11 DIAGNOSIS — C3492 Malignant neoplasm of unspecified part of left bronchus or lung: Secondary | ICD-10-CM

## 2014-12-11 DIAGNOSIS — Z0183 Encounter for blood typing: Secondary | ICD-10-CM | POA: Diagnosis not present

## 2014-12-11 DIAGNOSIS — Z01818 Encounter for other preprocedural examination: Secondary | ICD-10-CM | POA: Insufficient documentation

## 2014-12-11 DIAGNOSIS — Z01810 Encounter for preprocedural cardiovascular examination: Secondary | ICD-10-CM | POA: Insufficient documentation

## 2014-12-11 DIAGNOSIS — Z01812 Encounter for preprocedural laboratory examination: Secondary | ICD-10-CM | POA: Diagnosis not present

## 2014-12-11 HISTORY — DX: Dependence on supplemental oxygen: Z99.81

## 2014-12-11 HISTORY — DX: Drug induced constipation: K59.03

## 2014-12-11 HISTORY — DX: Adverse effect of other opioids, initial encounter: T40.2X5A

## 2014-12-11 HISTORY — DX: Presence of dental prosthetic device (complete) (partial): Z97.2

## 2014-12-11 HISTORY — DX: Complete loss of teeth, unspecified cause, unspecified class: K08.109

## 2014-12-11 LAB — COMPREHENSIVE METABOLIC PANEL
ALT: 19 U/L (ref 17–63)
ANION GAP: 8 (ref 5–15)
AST: 24 U/L (ref 15–41)
Albumin: 4 g/dL (ref 3.5–5.0)
Alkaline Phosphatase: 70 U/L (ref 38–126)
BILIRUBIN TOTAL: 0.4 mg/dL (ref 0.3–1.2)
BUN: 20 mg/dL (ref 6–20)
CHLORIDE: 108 mmol/L (ref 101–111)
CO2: 24 mmol/L (ref 22–32)
Calcium: 9.1 mg/dL (ref 8.9–10.3)
Creatinine, Ser: 0.85 mg/dL (ref 0.61–1.24)
GFR calc Af Amer: >60 mL/min (ref >60)
Glucose, Bld: 96 mg/dL (ref 65–99)
POTASSIUM: 4.1 mmol/L (ref 3.5–5.1)
SODIUM: 140 mmol/L (ref 135–145)
TOTAL PROTEIN: 7.5 g/dL (ref 6.5–8.1)

## 2014-12-11 LAB — BLOOD GAS, ARTERIAL
ACID-BASE EXCESS: 1.2 mmol/L (ref 0.0–2.0)
Bicarbonate: 25 mEq/L — ABNORMAL HIGH (ref 20.0–24.0)
DRAWN BY: 42180
FIO2: 0.21 %
O2 SAT: 96 %
PH ART: 7.43 (ref 7.350–7.450)
PO2 ART: 78.9 mmHg — AB (ref 80.0–100.0)
Patient temperature: 98.6
TCO2: 26.2 mmol/L (ref 0–100)
pCO2 arterial: 38.4 mmHg (ref 35.0–45.0)

## 2014-12-11 LAB — URINALYSIS, ROUTINE W REFLEX MICROSCOPIC
Bilirubin Urine: NEGATIVE
GLUCOSE, UA: NEGATIVE mg/dL
Hgb urine dipstick: NEGATIVE
KETONES UR: NEGATIVE mg/dL
Leukocytes, UA: NEGATIVE
NITRITE: NEGATIVE
PH: 6 (ref 5.0–8.0)
Protein, ur: NEGATIVE mg/dL
SPECIFIC GRAVITY, URINE: 1.03 (ref 1.005–1.030)
Urobilinogen, UA: 0.2 mg/dL (ref 0.0–1.0)

## 2014-12-11 LAB — APTT: APTT: 35 s (ref 24–37)

## 2014-12-11 LAB — CBC
HEMATOCRIT: 38.3 % — AB (ref 39.0–52.0)
Hemoglobin: 13 g/dL (ref 13.0–17.0)
MCH: 30.8 pg (ref 26.0–34.0)
MCHC: 33.9 g/dL (ref 30.0–36.0)
MCV: 90.8 fL (ref 78.0–100.0)
Platelets: 186 10*3/uL (ref 150–400)
RBC: 4.22 MIL/uL (ref 4.22–5.81)
RDW: 12.7 % (ref 11.5–15.5)
WBC: 6.5 10*3/uL (ref 4.0–10.5)

## 2014-12-11 LAB — SURGICAL PCR SCREEN
MRSA, PCR: NEGATIVE
Staphylococcus aureus: NEGATIVE

## 2014-12-11 LAB — ABO/RH: ABO/RH(D): O POS

## 2014-12-11 LAB — PROTIME-INR
INR: 1.08 (ref 0.00–1.49)
Prothrombin Time: 14.2 seconds (ref 11.6–15.2)

## 2014-12-11 NOTE — Progress Notes (Signed)
Cardiologist: Denies  Stress Test: 2013 or 2014 at [place]  Echo: Denies  EKG: Feb 2016 and pending in PAT  Cardiac Cath: Denies

## 2014-12-11 NOTE — Pre-Procedure Instructions (Addendum)
    Sasan Wilkie  12/11/2014      [id number] [place] [place] [address] [id number]: [phone]: [phone]   Your procedure is scheduled on 12/15/2014 at 730 A.M.  [facility]  Call this number if you have problems the morning of surgery:  [phone]   Remember:  Do not eat food or drink liquids after midnight.  Take these medicines the morning of surgery with A SIP OF WATER Amlodipine (Norvasc), Clonazepam (Klonopin), Mucinex DM, Norco (Hydrocodone-acetaminophen), Inhaler (can bring with you), Claritin (Loratadine), Omeprazole (Prilosec)   STOP all Vitamins, Supplements, Herbal Medications, Fish Oils, NSAIDs (Nonsteroidal Inflammatories such as Ibuprofen), Aspirins, and Goody's/BC Powders until after surgery as directed by your physician.    Do not wear jewelry.  Do not wear lotions, powders, or perfumes.  You may wear deodorant.  Men may shave face and neck.  Do not bring valuables to the hospital.  [facility] or [facility].  Contacts, dentures or bridgework may not be worn into surgery.  Leave your suitcase in the car.  After surgery it may be brought to your room.  For patients admitted to the hospital, discharge time will be determined by your treatment team.  Patients discharged the day of surgery will not be allowed to drive home.    Please read over the following fact sheets that you were given. Pain Booklet, Coughing and Deep Breathing, Blood Transfusion Information, MRSA Information and Surgical Site Infection Prevention

## 2014-12-15 ENCOUNTER — Inpatient Hospital Stay (HOSPITAL_COMMUNITY): Payer: Medicare Other | Admitting: Anesthesiology

## 2014-12-15 ENCOUNTER — Encounter (HOSPITAL_COMMUNITY): Payer: Self-pay | Admitting: *Deleted

## 2014-12-15 ENCOUNTER — Inpatient Hospital Stay (HOSPITAL_COMMUNITY): Payer: Medicare Other

## 2014-12-15 ENCOUNTER — Encounter (HOSPITAL_COMMUNITY)
Admission: RE | Disposition: A | Discharge status: Home / Self Care | Payer: Self-pay | Source: Ambulatory Visit | Attending: Thoracic Surgery (Cardiothoracic Vascular Surgery)

## 2014-12-15 ENCOUNTER — Inpatient Hospital Stay (HOSPITAL_COMMUNITY)
Admission: RE | Admit: 2014-12-15 | Discharge: 2014-12-21 | DRG: 164 | Disposition: A | Discharge status: Home / Self Care | Payer: Medicare Other | Source: Ambulatory Visit | Attending: Thoracic Surgery (Cardiothoracic Vascular Surgery) | Admitting: Thoracic Surgery (Cardiothoracic Vascular Surgery)

## 2014-12-15 DIAGNOSIS — J209 Acute bronchitis, unspecified: Secondary | ICD-10-CM | POA: Diagnosis present

## 2014-12-15 DIAGNOSIS — R1310 Dysphagia, unspecified: Secondary | ICD-10-CM | POA: Diagnosis present

## 2014-12-15 DIAGNOSIS — K219 Gastro-esophageal reflux disease without esophagitis: Secondary | ICD-10-CM | POA: Diagnosis not present

## 2014-12-15 DIAGNOSIS — Z01818 Encounter for other preprocedural examination: Secondary | ICD-10-CM | POA: Diagnosis not present

## 2014-12-15 DIAGNOSIS — Z87891 Personal history of nicotine dependence: Secondary | ICD-10-CM

## 2014-12-15 DIAGNOSIS — R000 Tachycardia, unspecified: Secondary | ICD-10-CM | POA: Diagnosis not present

## 2014-12-15 DIAGNOSIS — J9382 Other air leak: Secondary | ICD-10-CM | POA: Diagnosis not present

## 2014-12-15 DIAGNOSIS — Z4682 Encounter for fitting and adjustment of non-vascular catheter: Secondary | ICD-10-CM

## 2014-12-15 DIAGNOSIS — J939 Pneumothorax, unspecified: Secondary | ICD-10-CM

## 2014-12-15 DIAGNOSIS — J439 Emphysema, unspecified: Secondary | ICD-10-CM | POA: Diagnosis not present

## 2014-12-15 DIAGNOSIS — J9811 Atelectasis: Secondary | ICD-10-CM | POA: Diagnosis not present

## 2014-12-15 DIAGNOSIS — K5900 Constipation, unspecified: Secondary | ICD-10-CM | POA: Diagnosis not present

## 2014-12-15 DIAGNOSIS — C3412 Malignant neoplasm of upper lobe, left bronchus or lung: Secondary | ICD-10-CM | POA: Diagnosis not present

## 2014-12-15 DIAGNOSIS — J929 Pleural plaque without asbestos: Secondary | ICD-10-CM | POA: Diagnosis not present

## 2014-12-15 DIAGNOSIS — I10 Essential (primary) hypertension: Secondary | ICD-10-CM | POA: Diagnosis present

## 2014-12-15 DIAGNOSIS — Z4889 Encounter for other specified surgical aftercare: Secondary | ICD-10-CM | POA: Diagnosis not present

## 2014-12-15 DIAGNOSIS — Z48813 Encounter for surgical aftercare following surgery on the respiratory system: Secondary | ICD-10-CM | POA: Diagnosis not present

## 2014-12-15 DIAGNOSIS — J440 Chronic obstructive pulmonary disease with acute lower respiratory infection: Secondary | ICD-10-CM | POA: Diagnosis present

## 2014-12-15 DIAGNOSIS — J9383 Other pneumothorax: Secondary | ICD-10-CM | POA: Diagnosis not present

## 2014-12-15 DIAGNOSIS — M545 Low back pain: Secondary | ICD-10-CM | POA: Diagnosis not present

## 2014-12-15 DIAGNOSIS — C3490 Malignant neoplasm of unspecified part of unspecified bronchus or lung: Secondary | ICD-10-CM | POA: Diagnosis present

## 2014-12-15 DIAGNOSIS — Z902 Acquired absence of lung [part of]: Secondary | ICD-10-CM

## 2014-12-15 DIAGNOSIS — Z85118 Personal history of other malignant neoplasm of bronchus and lung: Secondary | ICD-10-CM

## 2014-12-15 DIAGNOSIS — J449 Chronic obstructive pulmonary disease, unspecified: Secondary | ICD-10-CM | POA: Diagnosis not present

## 2014-12-15 DIAGNOSIS — C3492 Malignant neoplasm of unspecified part of left bronchus or lung: Secondary | ICD-10-CM

## 2014-12-15 HISTORY — PX: LOBECTOMY: SHX5089

## 2014-12-15 HISTORY — PX: VIDEO ASSISTED THORACOSCOPY (VATS)/THOROCOTOMY: SHX6173

## 2014-12-15 HISTORY — PX: CRYO INTERCOSTAL NERVE BLOCK: SHX6522

## 2014-12-15 HISTORY — PX: NODE DISSECTION: SHX5269

## 2014-12-15 LAB — POCT I-STAT 7, (LYTES, BLD GAS, ICA,H+H)
Bicarbonate: 26.9 mEq/L — ABNORMAL HIGH (ref 20.0–24.0)
Calcium, Ion: 1.18 mmol/L (ref 1.13–1.30)
HCT: 34 % — ABNORMAL LOW (ref 39.0–52.0)
Hemoglobin: 11.6 g/dL — ABNORMAL LOW (ref 13.0–17.0)
O2 SAT: 100 %
PCO2 ART: 49.9 mmHg — AB (ref 35.0–45.0)
PO2 ART: 222 mmHg — AB (ref 80.0–100.0)
POTASSIUM: 4.5 mmol/L (ref 3.5–5.1)
Patient temperature: 35.8
Sodium: 138 mmol/L (ref 135–145)
TCO2: 28 mmol/L (ref 0–100)
pH, Arterial: 7.334 — ABNORMAL LOW (ref 7.350–7.450)

## 2014-12-15 LAB — POCT I-STAT 4, (NA,K, GLUC, HGB,HCT)
GLUCOSE: 182 mg/dL — AB (ref 65–99)
HCT: 30 % — ABNORMAL LOW (ref 39.0–52.0)
HEMOGLOBIN: 10.2 g/dL — AB (ref 13.0–17.0)
Potassium: 4.4 mmol/L (ref 3.5–5.1)
Sodium: 140 mmol/L (ref 135–145)

## 2014-12-15 LAB — GLUCOSE, CAPILLARY
Glucose-Capillary: 122 mg/dL — ABNORMAL HIGH (ref 65–99)
Glucose-Capillary: 128 mg/dL — ABNORMAL HIGH (ref 65–99)

## 2014-12-15 LAB — PREPARE RBC (CROSSMATCH)

## 2014-12-15 SURGERY — VIDEO ASSISTED THORACOSCOPY (VATS)/THOROCOTOMY
Anesthesia: General | Site: Chest | Laterality: Left

## 2014-12-15 MED ORDER — 0.9 % SODIUM CHLORIDE (POUR BTL) OPTIME
TOPICAL | Status: DC | PRN
  Administered 2014-12-15: 1000 mL

## 2014-12-15 MED ORDER — ONDANSETRON HCL 4 MG/2ML IJ SOLN: 4.0000 mg | Freq: Four times a day (QID) | INTRAMUSCULAR | Status: DC | PRN

## 2014-12-15 MED ORDER — SUCRALFATE 1 GM/10ML PO SUSP
1.0000 g | Freq: Three times a day (TID) | ORAL | Status: DC
  Filled 2014-12-15 (×27): qty 10

## 2014-12-15 MED ORDER — OXYCODONE HCL 5 MG PO TABS
5.0000 mg | ORAL_TABLET | ORAL | Status: DC | PRN
  Administered 2014-12-16 – 2014-12-19 (×5): 5 mg via ORAL
  Administered 2014-12-19: 10 mg via ORAL
  Administered 2014-12-19 – 2014-12-21 (×2): 5 mg via ORAL
  Filled 2014-12-15: qty 2
  Filled 2014-12-15: qty 1
  Filled 2014-12-15: qty 2
  Filled 2014-12-15 (×5): qty 1

## 2014-12-15 MED ORDER — DEXTROSE 5 % IV SOLN
INTRAVENOUS | Status: AC
  Filled 2014-12-15: qty 1.5

## 2014-12-15 MED ORDER — AMLODIPINE BESYLATE 5 MG PO TABS
5.0000 mg | ORAL_TABLET | Freq: Every day | ORAL | Status: DC
  Administered 2014-12-16 – 2014-12-21 (×4): 5 mg via ORAL
  Filled 2014-12-15 (×6): qty 1

## 2014-12-15 MED ORDER — MIDAZOLAM HCL 2 MG/2ML IJ SOLN
INTRAMUSCULAR | Status: AC
  Filled 2014-12-15: qty 2

## 2014-12-15 MED ORDER — SODIUM CHLORIDE 0.9 % IJ SOLN
INTRAMUSCULAR | Status: AC
  Filled 2014-12-15: qty 10

## 2014-12-15 MED ORDER — GLYCOPYRROLATE 0.2 MG/ML IJ SOLN
INTRAMUSCULAR | Status: AC
  Filled 2014-12-15: qty 2

## 2014-12-15 MED ORDER — ONDANSETRON HCL 4 MG/2ML IJ SOLN
INTRAMUSCULAR | Status: AC
  Filled 2014-12-15: qty 2

## 2014-12-15 MED ORDER — CENTRUM PO CHEW: 1.0000 | CHEWABLE_TABLET | Freq: Every day | ORAL | Status: DC

## 2014-12-15 MED ORDER — KCL IN DEXTROSE-NACL 20-5-0.45 MEQ/L-%-% IV SOLN
INTRAVENOUS | Status: AC
  Filled 2014-12-15: qty 1000

## 2014-12-15 MED ORDER — HYDROMORPHONE HCL 1 MG/ML IJ SOLN
0.2500 mg | INTRAMUSCULAR | Status: DC | PRN
  Administered 2014-12-15: 0.5 mg via INTRAVENOUS

## 2014-12-15 MED ORDER — DEXTROSE 5 % IV SOLN
1.5000 g | INTRAVENOUS | Status: AC
  Administered 2014-12-15 (×2): 1.5 g via INTRAVENOUS

## 2014-12-15 MED ORDER — PROPOFOL 10 MG/ML IV BOLUS
INTRAVENOUS | Status: DC | PRN
  Administered 2014-12-15: 150 mg via INTRAVENOUS

## 2014-12-15 MED ORDER — EPHEDRINE SULFATE 50 MG/ML IJ SOLN
INTRAMUSCULAR | Status: AC
  Filled 2014-12-15: qty 1

## 2014-12-15 MED ORDER — KCL IN DEXTROSE-NACL 20-5-0.9 MEQ/L-%-% IV SOLN
INTRAVENOUS | Status: DC
Start: 2014-12-15 — End: 2014-12-21
  Administered 2014-12-15 – 2014-12-17 (×5): via INTRAVENOUS
  Filled 2014-12-15 (×11): qty 1000

## 2014-12-15 MED ORDER — SODIUM CHLORIDE 0.9 % IJ SOLN: 9.0000 mL | INTRAMUSCULAR | Status: DC | PRN

## 2014-12-15 MED ORDER — LACTATED RINGERS IV SOLN
INTRAVENOUS | Status: DC | PRN
  Administered 2014-12-15 (×4): via INTRAVENOUS

## 2014-12-15 MED ORDER — DEXTROSE 5 % IV SOLN
1.5000 g | INTRAVENOUS | Status: DC
  Filled 2014-12-15: qty 1.5

## 2014-12-15 MED ORDER — DEXTROSE 5 % IV SOLN
1.5000 g | Freq: Two times a day (BID) | INTRAVENOUS | Status: AC
  Administered 2014-12-15 – 2014-12-16 (×2): 1.5 g via INTRAVENOUS
  Filled 2014-12-15 (×2): qty 1.5

## 2014-12-15 MED ORDER — STERILE WATER FOR INJECTION IJ SOLN
INTRAMUSCULAR | Status: AC
  Filled 2014-12-15: qty 20

## 2014-12-15 MED ORDER — SUGAMMADEX SODIUM 200 MG/2ML IV SOLN
INTRAVENOUS | Status: AC
  Filled 2014-12-15: qty 2

## 2014-12-15 MED ORDER — ACETAMINOPHEN 500 MG PO TABS
1000.0000 mg | ORAL_TABLET | Freq: Four times a day (QID) | ORAL | Status: AC
  Administered 2014-12-15 – 2014-12-20 (×18): 1000 mg via ORAL
  Filled 2014-12-15 (×18): qty 2

## 2014-12-15 MED ORDER — BUDESONIDE-FORMOTEROL FUMARATE 160-4.5 MCG/ACT IN AERO
2.0000 | INHALATION_SPRAY | Freq: Two times a day (BID) | RESPIRATORY_TRACT | Status: DC
  Administered 2014-12-15 – 2014-12-21 (×11): 2 via RESPIRATORY_TRACT
  Filled 2014-12-15 (×3): qty 6

## 2014-12-15 MED ORDER — ONDANSETRON HCL 4 MG/2ML IJ SOLN: 4.0000 mg | Freq: Once | INTRAMUSCULAR | Status: DC | PRN

## 2014-12-15 MED ORDER — PANTOPRAZOLE SODIUM 40 MG PO TBEC
40.0000 mg | DELAYED_RELEASE_TABLET | Freq: Every day | ORAL | Status: DC
  Administered 2014-12-16 – 2014-12-21 (×6): 40 mg via ORAL
  Filled 2014-12-15 (×5): qty 1

## 2014-12-15 MED ORDER — ADULT MULTIVITAMIN W/MINERALS CH
1.0000 | ORAL_TABLET | Freq: Every day | ORAL | Status: DC
  Administered 2014-12-16 – 2014-12-21 (×6): 1 via ORAL
  Filled 2014-12-15 (×7): qty 1

## 2014-12-15 MED ORDER — POTASSIUM CHLORIDE 10 MEQ/50ML IV SOLN: 10.0000 meq | Freq: Every day | INTRAVENOUS | Status: DC | PRN

## 2014-12-15 MED ORDER — IPRATROPIUM-ALBUTEROL 0.5-2.5 (3) MG/3ML IN SOLN
3.0000 mL | Freq: Four times a day (QID) | RESPIRATORY_TRACT | Status: DC | PRN
  Administered 2014-12-17 – 2014-12-18 (×3): 3 mL via RESPIRATORY_TRACT
  Filled 2014-12-15 (×4): qty 3

## 2014-12-15 MED ORDER — SENNOSIDES-DOCUSATE SODIUM 8.6-50 MG PO TABS
1.0000 | ORAL_TABLET | Freq: Every day | ORAL | Status: DC
  Administered 2014-12-15 – 2014-12-20 (×6): 1 via ORAL
  Filled 2014-12-15 (×7): qty 1

## 2014-12-15 MED ORDER — CLONAZEPAM 1 MG PO TABS
1.0000 mg | ORAL_TABLET | Freq: Two times a day (BID) | ORAL | Status: DC
  Administered 2014-12-16 – 2014-12-21 (×11): 1 mg via ORAL
  Filled 2014-12-15 (×11): qty 1

## 2014-12-15 MED ORDER — FENTANYL 10 MCG/ML IV SOLN
INTRAVENOUS | Status: DC
  Administered 2014-12-15: 50 ug via INTRAVENOUS
  Administered 2014-12-15: 14:00:00 via INTRAVENOUS
  Administered 2014-12-15: 10 ug via INTRAVENOUS
  Administered 2014-12-16 (×2): 30 ug via INTRAVENOUS
  Administered 2014-12-16: 70 ug via INTRAVENOUS
  Administered 2014-12-16: 40 ug via INTRAVENOUS
  Administered 2014-12-16: 90 ug via INTRAVENOUS
  Administered 2014-12-16: 20 ug via INTRAVENOUS
  Administered 2014-12-17: 160 ug via INTRAVENOUS
  Administered 2014-12-17: 09:00:00 via INTRAVENOUS
  Administered 2014-12-17: 50 ug via INTRAVENOUS
  Administered 2014-12-17: 20 ug via INTRAVENOUS
  Administered 2014-12-17: 40 ug via INTRAVENOUS
  Administered 2014-12-17: 0 ug via INTRAVENOUS
  Administered 2014-12-17 (×2): 70 ug via INTRAVENOUS
  Administered 2014-12-18: 14:00:00 via INTRAVENOUS
  Administered 2014-12-18: 60 ug via INTRAVENOUS
  Administered 2014-12-18: 10 ug via INTRAVENOUS
  Administered 2014-12-18: 110 ug via INTRAVENOUS
  Administered 2014-12-18: 90 ug via INTRAVENOUS
  Administered 2014-12-19: 160 ug via INTRAVENOUS
  Administered 2014-12-19: 50 ug via INTRAVENOUS
  Administered 2014-12-19: 21.98 ug via INTRAVENOUS
  Administered 2014-12-19: 50 ug via INTRAVENOUS
  Administered 2014-12-19: 21:00:00 via INTRAVENOUS
  Administered 2014-12-19: 66.01 ug via INTRAVENOUS
  Administered 2014-12-20 (×2): 70 ug via INTRAVENOUS
  Administered 2014-12-20: 20 ug via INTRAVENOUS
  Administered 2014-12-20: 100 ug via INTRAVENOUS
  Administered 2014-12-20: 50 ug via INTRAVENOUS
  Administered 2014-12-21: 20 ug via INTRAVENOUS
  Administered 2014-12-21: 10 ug via INTRAVENOUS
  Administered 2014-12-21: 30 ug via INTRAVENOUS
  Administered 2014-12-21: 40 ug via INTRAVENOUS
  Filled 2014-12-15 (×4): qty 50

## 2014-12-15 MED ORDER — DIPHENHYDRAMINE HCL 50 MG/ML IJ SOLN: 12.5000 mg | Freq: Four times a day (QID) | INTRAMUSCULAR | Status: DC | PRN

## 2014-12-15 MED ORDER — BISACODYL 5 MG PO TBEC
10.0000 mg | DELAYED_RELEASE_TABLET | Freq: Every day | ORAL | Status: DC
  Administered 2014-12-16 – 2014-12-21 (×6): 10 mg via ORAL
  Filled 2014-12-15 (×5): qty 2

## 2014-12-15 MED ORDER — FENTANYL CITRATE (PF) 250 MCG/5ML IJ SOLN
INTRAMUSCULAR | Status: AC
  Filled 2014-12-15: qty 5

## 2014-12-15 MED ORDER — LIDOCAINE HCL (CARDIAC) 20 MG/ML IV SOLN
INTRAVENOUS | Status: AC
  Filled 2014-12-15: qty 5

## 2014-12-15 MED ORDER — LORATADINE 10 MG PO TABS
10.0000 mg | ORAL_TABLET | Freq: Two times a day (BID) | ORAL | Status: DC
  Administered 2014-12-15 – 2014-12-21 (×12): 10 mg via ORAL
  Filled 2014-12-15 (×13): qty 1

## 2014-12-15 MED ORDER — ROCURONIUM BROMIDE 100 MG/10ML IV SOLN
INTRAVENOUS | Status: DC | PRN
  Administered 2014-12-15: 50 mg via INTRAVENOUS

## 2014-12-15 MED ORDER — FENTANYL CITRATE (PF) 250 MCG/5ML IJ SOLN
INTRAMUSCULAR | Status: DC | PRN
  Administered 2014-12-15 (×3): 50 ug via INTRAVENOUS
  Administered 2014-12-15: 100 ug via INTRAVENOUS
  Administered 2014-12-15: 50 ug via INTRAVENOUS
  Administered 2014-12-15: 100 ug via INTRAVENOUS

## 2014-12-15 MED ORDER — ROCURONIUM BROMIDE 50 MG/5ML IV SOLN
INTRAVENOUS | Status: AC
  Filled 2014-12-15: qty 1

## 2014-12-15 MED ORDER — PHENYLEPHRINE HCL 10 MG/ML IJ SOLN
10.0000 mg | INTRAVENOUS | Status: DC | PRN
  Administered 2014-12-15: 15 ug/min via INTRAVENOUS

## 2014-12-15 MED ORDER — GLYCOPYRROLATE 0.2 MG/ML IJ SOLN
INTRAMUSCULAR | Status: DC | PRN
  Administered 2014-12-15: .4 mg via INTRAVENOUS

## 2014-12-15 MED ORDER — PROPOFOL 10 MG/ML IV BOLUS
INTRAVENOUS | Status: AC
  Filled 2014-12-15: qty 20

## 2014-12-15 MED ORDER — SUGAMMADEX SODIUM 200 MG/2ML IV SOLN
INTRAVENOUS | Status: DC | PRN
  Administered 2014-12-15: 200 mg via INTRAVENOUS

## 2014-12-15 MED ORDER — ACETAMINOPHEN 160 MG/5ML PO SOLN
1000.0000 mg | Freq: Four times a day (QID) | ORAL | Status: AC
  Filled 2014-12-15: qty 40

## 2014-12-15 MED ORDER — PHENYLEPHRINE 40 MCG/ML (10ML) SYRINGE FOR IV PUSH (FOR BLOOD PRESSURE SUPPORT)
PREFILLED_SYRINGE | INTRAVENOUS | Status: AC
  Filled 2014-12-15: qty 10

## 2014-12-15 MED ORDER — NEOSTIGMINE METHYLSULFATE 10 MG/10ML IV SOLN
INTRAVENOUS | Status: AC
  Filled 2014-12-15: qty 1

## 2014-12-15 MED ORDER — NALOXONE HCL 0.4 MG/ML IJ SOLN: 0.4000 mg | INTRAMUSCULAR | Status: DC | PRN

## 2014-12-15 MED ORDER — VECURONIUM BROMIDE 10 MG IV SOLR
INTRAVENOUS | Status: DC | PRN
  Administered 2014-12-15 (×3): 2 mg via INTRAVENOUS
  Administered 2014-12-15: 1 mg via INTRAVENOUS
  Administered 2014-12-15 (×4): 2 mg via INTRAVENOUS

## 2014-12-15 MED ORDER — ONDANSETRON HCL 4 MG/2ML IJ SOLN
INTRAMUSCULAR | Status: DC | PRN
  Administered 2014-12-15: 4 mg via INTRAVENOUS

## 2014-12-15 MED ORDER — TRAMADOL HCL 50 MG PO TABS: 50.0000 mg | ORAL_TABLET | Freq: Four times a day (QID) | ORAL | Status: DC | PRN

## 2014-12-15 MED ORDER — DIPHENHYDRAMINE HCL 12.5 MG/5ML PO ELIX
12.5000 mg | ORAL_SOLUTION | Freq: Four times a day (QID) | ORAL | Status: DC | PRN
  Filled 2014-12-15: qty 5

## 2014-12-15 MED ORDER — INSULIN ASPART 100 UNIT/ML ~~LOC~~ SOLN
0.0000 [IU] | Freq: Four times a day (QID) | SUBCUTANEOUS | Status: DC
  Administered 2014-12-15: 2 [IU] via SUBCUTANEOUS
  Administered 2014-12-16: 4 [IU] via SUBCUTANEOUS
  Administered 2014-12-16: 2 [IU] via SUBCUTANEOUS

## 2014-12-15 MED ORDER — MIDAZOLAM HCL 5 MG/5ML IJ SOLN
INTRAMUSCULAR | Status: DC | PRN
  Administered 2014-12-15: 2 mg via INTRAVENOUS

## 2014-12-15 MED ORDER — MEPERIDINE HCL 25 MG/ML IJ SOLN: 6.2500 mg | INTRAMUSCULAR | Status: DC | PRN

## 2014-12-15 MED ORDER — GLYCOPYRROLATE 0.2 MG/ML IJ SOLN
INTRAMUSCULAR | Status: AC
  Filled 2014-12-15: qty 3

## 2014-12-15 MED ORDER — HYDROMORPHONE HCL 1 MG/ML IJ SOLN
INTRAMUSCULAR | Status: AC
  Administered 2014-12-15: 0.5 mg via INTRAVENOUS
  Filled 2014-12-15: qty 1

## 2014-12-15 MED ORDER — VECURONIUM BROMIDE 10 MG IV SOLR
INTRAVENOUS | Status: AC
  Filled 2014-12-15: qty 20

## 2014-12-15 MED ORDER — SUCCINYLCHOLINE CHLORIDE 20 MG/ML IJ SOLN
INTRAMUSCULAR | Status: AC
  Filled 2014-12-15: qty 1

## 2014-12-15 MED ORDER — PHENYLEPHRINE HCL 10 MG/ML IJ SOLN
INTRAMUSCULAR | Status: DC | PRN
  Administered 2014-12-15 (×3): 80 ug via INTRAVENOUS

## 2014-12-15 SURGICAL SUPPLY — 74 items
APPLIER CLIP ROT 10 11.4 M/L (STAPLE) ×4
CANISTER SUCTION 2500CC (MISCELLANEOUS) ×4 IMPLANT
CATH THORACIC 28FR (CATHETERS) ×4 IMPLANT
CLIP APPLIE ROT 10 11.4 M/L (STAPLE) ×2 IMPLANT
CLIP TI MEDIUM 6 (CLIP) ×4 IMPLANT
CONN ST 1/4X3/8  BEN (MISCELLANEOUS) ×2
CONN ST 1/4X3/8 BEN (MISCELLANEOUS) ×2 IMPLANT
CONN Y 3/8X3/8X3/8  BEN (MISCELLANEOUS) ×2
CONN Y 3/8X3/8X3/8 BEN (MISCELLANEOUS) ×2 IMPLANT
CONT SPEC 4OZ CLIKSEAL STRL BL (MISCELLANEOUS) ×32 IMPLANT
CUTTER ECHEON FLEX ENDO 45 340 (ENDOMECHANICALS) ×4 IMPLANT
DERMABOND ADVANCED (GAUZE/BANDAGES/DRESSINGS) ×2
DERMABOND ADVANCED .7 DNX12 (GAUZE/BANDAGES/DRESSINGS) ×2 IMPLANT
DRAIN CHANNEL 28F RND 3/8 FF (WOUND CARE) ×4 IMPLANT
DRAPE LAPAROSCOPIC ABDOMINAL (DRAPES) ×4 IMPLANT
DRAPE WARM FLUID 44X44 (DRAPE) ×4 IMPLANT
ELECT BLADE 6.5 EXT (BLADE) ×4 IMPLANT
ELECT REM PT RETURN 9FT ADLT (ELECTROSURGICAL) ×4
ELECTRODE REM PT RTRN 9FT ADLT (ELECTROSURGICAL) ×2 IMPLANT
GAUZE SPONGE 4X4 12PLY STRL (GAUZE/BANDAGES/DRESSINGS) ×4 IMPLANT
GLOVE BIO SURGEON STRL SZ 6.5 (GLOVE) ×3 IMPLANT
GLOVE BIO SURGEONS STRL SZ 6.5 (GLOVE) ×1
GLOVE BIOGEL PI IND STRL 7.0 (GLOVE) ×4 IMPLANT
GLOVE BIOGEL PI INDICATOR 7.0 (GLOVE) ×4
GLOVE SURG SIGNA 7.5 PF LTX (GLOVE) ×8 IMPLANT
GLOVE SURG SS PI 7.0 STRL IVOR (GLOVE) ×8 IMPLANT
GOWN STRL REUS W/ TWL LRG LVL3 (GOWN DISPOSABLE) ×6 IMPLANT
GOWN STRL REUS W/ TWL XL LVL3 (GOWN DISPOSABLE) ×2 IMPLANT
GOWN STRL REUS W/TWL LRG LVL3 (GOWN DISPOSABLE) ×6
GOWN STRL REUS W/TWL XL LVL3 (GOWN DISPOSABLE) ×2
KIT BASIN OR (CUSTOM PROCEDURE TRAY) ×4 IMPLANT
KIT ROOM TURNOVER OR (KITS) ×4 IMPLANT
KIT SUCTION CATH 14FR (SUCTIONS) ×4 IMPLANT
NS IRRIG 1000ML POUR BTL (IV SOLUTION) ×12 IMPLANT
PACK CHEST (CUSTOM PROCEDURE TRAY) ×4 IMPLANT
PAD ARMBOARD 7.5X6 YLW CONV (MISCELLANEOUS) ×8 IMPLANT
POUCH ENDO CATCH II 15MM (MISCELLANEOUS) ×8 IMPLANT
PROBE CRYO2-ABLATION MALLABLE (MISCELLANEOUS) ×4 IMPLANT
RELOAD GOLD ECHELON 45 (STAPLE) ×20 IMPLANT
RELOAD GREEN ECHELON 45 (STAPLE) ×12 IMPLANT
SCISSORS ENDO CVD 5DCS (MISCELLANEOUS) IMPLANT
SEALANT PROGEL (MISCELLANEOUS) IMPLANT
SOLUTION ANTI FOG 6CC (MISCELLANEOUS) ×4 IMPLANT
SPONGE GAUZE 4X4 12PLY STER LF (GAUZE/BANDAGES/DRESSINGS) ×4 IMPLANT
SPONGE INTESTINAL PEANUT (DISPOSABLE) ×4 IMPLANT
SPONGE TONSIL 1 RF SGL (DISPOSABLE) ×4 IMPLANT
STAPLE RELOAD 2.5MM WHITE (STAPLE) ×28 IMPLANT
STAPLER VASCULAR ECHELON 35 (CUTTER) ×4 IMPLANT
SUT PROLENE 4 0 RB 1 (SUTURE)
SUT PROLENE 4-0 RB1 .5 CRCL 36 (SUTURE) IMPLANT
SUT SILK  1 MH (SUTURE) ×4
SUT SILK 1 MH (SUTURE) ×4 IMPLANT
SUT SILK 1 TIES 10X30 (SUTURE) ×4 IMPLANT
SUT SILK 2 0 SH (SUTURE) IMPLANT
SUT SILK 2 0SH CR/8 30 (SUTURE) IMPLANT
SUT SILK 3 0 SH 30 (SUTURE) IMPLANT
SUT SILK 3 0SH CR/8 30 (SUTURE) IMPLANT
SUT VIC AB 1 CTX 36 (SUTURE) ×2
SUT VIC AB 1 CTX36XBRD ANBCTR (SUTURE) ×2 IMPLANT
SUT VIC AB 2-0 CTX 36 (SUTURE) ×4 IMPLANT
SUT VIC AB 2-0 UR6 27 (SUTURE) ×4 IMPLANT
SUT VIC AB 3-0 MH 27 (SUTURE) IMPLANT
SUT VIC AB 3-0 SH 27 (SUTURE) ×2
SUT VIC AB 3-0 SH 27X BRD (SUTURE) ×2 IMPLANT
SUT VIC AB 3-0 X1 27 (SUTURE) ×8 IMPLANT
SUT VICRYL 2 TP 1 (SUTURE) IMPLANT
SYSTEM SAHARA CHEST DRAIN ATS (WOUND CARE) ×4 IMPLANT
TAPE CLOTH 4X10 WHT NS (GAUZE/BANDAGES/DRESSINGS) ×4 IMPLANT
TAPE CLOTH SURG 6X10 WHT LF (GAUZE/BANDAGES/DRESSINGS) ×4 IMPLANT
TOWEL OR 17X24 6PK STRL BLUE (TOWEL DISPOSABLE) ×4 IMPLANT
TOWEL OR 17X26 10 PK STRL BLUE (TOWEL DISPOSABLE) ×4 IMPLANT
TRAY FOLEY CATH 16FRSI W/METER (SET/KITS/TRAYS/PACK) ×4 IMPLANT
TROCAR XCEL BLADELESS 5X75MML (TROCAR) ×4 IMPLANT
WATER STERILE IRR 1000ML POUR (IV SOLUTION) ×4 IMPLANT

## 2014-12-15 NOTE — Progress Notes (Signed)
[facility] BRIEF SICU PROGRESS NOTE  Day of Surgery  S/P Procedure(s) (LRB): LEFT VIDEO ASSISTED THORACOSCOPY (Left) LEFT UPPER LOBECTOMY (Left) CRYO INTERCOSTAL NERVE BLOCK, LEFT (Left) NODE DISSECTION (Left)   Doing well early postop Resting comfortably w/ good pain control NSR w/ stable BP O2 sats 96% Chest tube output low, + air leak  Plan: Continue routine early postop  Rexene Alberts 12/15/2014 7:48 PM

## 2014-12-15 NOTE — Transfer of Care (Signed)
Immediate Anesthesia Transfer of Care Note  Patient: Jeffrey Hines  Procedure(s) Performed: Procedure(s): LEFT VIDEO ASSISTED THORACOSCOPY (Left) LEFT UPPER LOBECTOMY (Left) CRYO INTERCOSTAL NERVE BLOCK, LEFT (Left) NODE DISSECTION (Left)  Patient Location: PACU  Anesthesia Type:General  Level of Consciousness: sedated and responds to stimulation  Airway & Oxygen Therapy: Patient Spontanous Breathing and Patient connected to face mask oxygen  Post-op Assessment: Report given to RN, Post -op Vital signs reviewed and stable and Patient moving all extremities  Post vital signs: Reviewed and stable  Last Vitals:  Filed Vitals:   12/15/14 0607  BP: 139/68  Pulse: 69  Temp: 36.5 C  Resp: 20    Complications: No apparent anesthesia complications

## 2014-12-15 NOTE — Brief Op Note (Addendum)
12/15/2014  12:41 PM  PATIENT:  Jeffrey Hines  69 y.o. male  PRE-OPERATIVE DIAGNOSIS:  STAGE IIB NON SMALL CELL LUNG CANCER OF LEFT UPPER LOBE, S/P NEOADJUVANT CHEMORADIATION  POST-OPERATIVE DIAGNOSIS:   STAGE IIB NON SMALL CELL LUNG CANCER OF LEFT UPPER LOBE, S/P NEOADJUVANT CHEMORADIATION  PROCEDURE:   LEFT VIDEO ASSISTED THORACOSCOPY, WEDGE RESECTION LEFT LOWER LOBE, THORACOSCOPIC LEFT UPPER LOBECTOMY, CRYO INTERCOSTAL NERVE BLOCK 3-7, MEDIASTINAL LYMPH NODE DISSECTION  SURGEON:  Surgeon(s) and Role:    * Melrose Nakayama, MD - Primary  PHYSICIAN ASSISTANT: Lars Pinks PA-C  ANESTHESIA:   general  EBL:  Total I/O In: 65 [I.V.:2600] Out: 2542 [Urine:385; Blood:1250]  BLOOD ADMINISTERED:none  DRAINS: 46 French chest tube and blake drain in the left pleural space   SPECIMEN:  Source of Specimen:  LUL,multiple lymph nodes, wedge LLL  DISPOSITION OF SPECIMEN:  PATHOLOGY. Margins are negative for cancer  COUNTS CORRECT:  YES  DICTATION: .Dragon Dictation  PLAN OF CARE: Admit to inpatient   PATIENT DISPOSITION:  PACU - hemodynamically stable.   Delay start of Pharmacological VTE agent (>24hrs) due to surgical blood loss or risk of bleeding: yes  FINDINGS: Extensive adhesions. Left lower lobe nodule- frozen showed benign fibrosis. Bronchial margin negative.
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
## 2014-12-15 NOTE — Progress Notes (Signed)
Utilization Review Completed.Jeffrey Hines T7/04/2015  

## 2014-12-15 NOTE — Anesthesia Procedure Notes (Addendum)
Procedure Name: Intubation Date/Time: 12/15/2014 7:40 AM Performed by: Julian Reil Pre-anesthesia Checklist: Patient identified, Suction available, Emergency Drugs available and Patient being monitored Patient Re-evaluated:Patient Re-evaluated prior to inductionOxygen Delivery Method: Circle system utilized Preoxygenation: Pre-oxygenation with 100% oxygen Intubation Type: IV induction Ventilation: Mask ventilation without difficulty and Oral airway inserted - appropriate to patient size Laryngoscope Size: Mac and 4 Grade View: Grade I Tube type: Oral Endobronchial tube: Left, EBT position confirmed by auscultation, EBT position confirmed by fiberoptic bronchoscope and Double lumen EBT and 39 Fr Number of attempts: 1 Airway Equipment and Method: Stylet and Fiberoptic brochoscope Placement Confirmation: ETT inserted through vocal cords under direct vision,  positive ETCO2 and breath sounds checked- equal and bilateral Tube secured with: Tape Dental Injury: Teeth and Oropharynx as per pre-operative assessment

## 2014-12-15 NOTE — Anesthesia Postprocedure Evaluation (Signed)
Anesthesia Post Note  Patient: Jeffrey Hines  Procedure(s) Performed: Procedure(s) (LRB): LEFT VIDEO ASSISTED THORACOSCOPY (Left) LEFT UPPER LOBECTOMY (Left) CRYO INTERCOSTAL NERVE BLOCK, LEFT (Left) NODE DISSECTION (Left)  Anesthesia type: general  Patient location: PACU  Post pain: Pain level controlled  Post assessment: Patient's Cardiovascular Status Stable  Last Vitals:  Filed Vitals:   12/15/14 1458  BP:   Pulse: 75  Temp:   Resp: 24    Post vital signs: Reviewed and stable  Level of consciousness: sedated  Complications: No apparent anesthesia complications

## 2014-12-15 NOTE — Interval H&P Note (Signed)
History and Physical Interval Note:  12/15/2014 7:17 AM  Jeffrey Hines  has presented today for surgery, with the diagnosis of NON SMALL CELL LUNG CANCER  The various methods of treatment have been discussed with the patient and family. After consideration of risks, benefits and other options for treatment, the patient has consented to  Procedure(s): VIDEO ASSISTED THORACOSCOPY (VATS)/POSSIBLE THOROCOTOMY (Left) LEFT UPPER LOBECTOMY (Left) CRYO INTERCOSTAL NERVE BLOCK (Left) as a surgical intervention .  The patient's history has been reviewed, patient examined, no change in status, stable for surgery.  I have reviewed the patient's chart and labs.  Questions were answered to the patient's satisfaction.     Melrose Nakayama

## 2014-12-15 NOTE — Anesthesia Preprocedure Evaluation (Signed)
Anesthesia Evaluation  Patient identified by MRN, date of birth, ID band Patient awake    Reviewed: Allergy & Precautions, NPO status , Patient's Chart, lab work & pertinent test results  Airway Mallampati: I  TM Distance: >3 FB Neck ROM: Full    Dental   Pulmonary former smoker,    Pulmonary exam normal       Cardiovascular hypertension, Pt. on medications Normal cardiovascular exam    Neuro/Psych    GI/Hepatic GERD-  Medicated and Controlled,  Endo/Other    Renal/GU      Musculoskeletal   Abdominal   Peds  Hematology   Anesthesia Other Findings   Reproductive/Obstetrics                             Anesthesia Physical Anesthesia Plan  ASA: III  Anesthesia Plan: General   Post-op Pain Management:    Induction: Intravenous  Airway Management Planned: Double Lumen EBT  Additional Equipment: Arterial line, CVP and Ultrasound Guidance Line Placement  Intra-op Plan:   Post-operative Plan: Extubation in OR  Informed Consent: I have reviewed the patients History and Physical, chart, labs and discussed the procedure including the risks, benefits and alternatives for the proposed anesthesia with the patient or authorized representative who has indicated his/her understanding and acceptance.     Plan Discussed with: CRNA and Surgeon  Anesthesia Plan Comments:         Anesthesia Quick Evaluation

## 2014-12-16 ENCOUNTER — Inpatient Hospital Stay (HOSPITAL_COMMUNITY): Payer: Medicare Other

## 2014-12-16 ENCOUNTER — Encounter (HOSPITAL_COMMUNITY): Payer: Self-pay | Admitting: Thoracic Surgery (Cardiothoracic Vascular Surgery)

## 2014-12-16 LAB — GLUCOSE, CAPILLARY
GLUCOSE-CAPILLARY: 135 mg/dL — AB (ref 65–99)
Glucose-Capillary: 137 mg/dL — ABNORMAL HIGH (ref 65–99)
Glucose-Capillary: 145 mg/dL — ABNORMAL HIGH (ref 65–99)
Glucose-Capillary: 72 mg/dL (ref 65–99)

## 2014-12-16 LAB — BASIC METABOLIC PANEL
ANION GAP: 5 (ref 5–15)
BUN: 8 mg/dL (ref 6–20)
CALCIUM: 7.8 mg/dL — AB (ref 8.9–10.3)
CO2: 27 mmol/L (ref 22–32)
CREATININE: 0.77 mg/dL (ref 0.61–1.24)
Chloride: 107 mmol/L (ref 101–111)
GFR calc Af Amer: >60 mL/min (ref >60)
GFR calc non Af Amer: >60 mL/min (ref >60)
Glucose, Bld: 151 mg/dL — ABNORMAL HIGH (ref 65–99)
POTASSIUM: 4 mmol/L (ref 3.5–5.1)
Sodium: 139 mmol/L (ref 135–145)

## 2014-12-16 LAB — POCT I-STAT 3, ART BLOOD GAS (G3+)
ACID-BASE EXCESS: 2 mmol/L (ref 0.0–2.0)
Bicarbonate: 26.9 mEq/L — ABNORMAL HIGH (ref 20.0–24.0)
O2 Saturation: 91 %
PCO2 ART: 43.3 mmHg (ref 35.0–45.0)
Patient temperature: 98.2
TCO2: 28 mmol/L (ref 0–100)
pH, Arterial: 7.4 (ref 7.350–7.450)
pO2, Arterial: 62 mmHg — ABNORMAL LOW (ref 80.0–100.0)

## 2014-12-16 LAB — TYPE AND SCREEN
ABO/RH(D): O POS
Antibody Screen: NEGATIVE
Unit division: 0
Unit division: 0

## 2014-12-16 LAB — CBC
HCT: 34 % — ABNORMAL LOW (ref 39.0–52.0)
HEMOGLOBIN: 11.2 g/dL — AB (ref 13.0–17.0)
MCH: 30.5 pg (ref 26.0–34.0)
MCHC: 32.9 g/dL (ref 30.0–36.0)
MCV: 92.6 fL (ref 78.0–100.0)
Platelets: 160 10*3/uL (ref 150–400)
RBC: 3.67 MIL/uL — AB (ref 4.22–5.81)
RDW: 12.6 % (ref 11.5–15.5)
WBC: 10 10*3/uL (ref 4.0–10.5)

## 2014-12-16 MED ORDER — INSULIN ASPART 100 UNIT/ML ~~LOC~~ SOLN
0.0000 [IU] | Freq: Three times a day (TID) | SUBCUTANEOUS | Status: DC
  Administered 2014-12-16 – 2014-12-21 (×4): 2 [IU] via SUBCUTANEOUS

## 2014-12-16 MED ORDER — DM-GUAIFENESIN ER 30-600 MG PO TB12
1.0000 | ORAL_TABLET | Freq: Two times a day (BID) | ORAL | Status: DC
  Administered 2014-12-16 (×2): 1 via ORAL
  Filled 2014-12-16 (×4): qty 1

## 2014-12-16 MED ORDER — ENOXAPARIN SODIUM 40 MG/0.4ML ~~LOC~~ SOLN
40.0000 mg | SUBCUTANEOUS | Status: DC
  Administered 2014-12-16 – 2014-12-21 (×6): 40 mg via SUBCUTANEOUS
  Filled 2014-12-16 (×6): qty 0.4

## 2014-12-16 MED ORDER — CENTRUM PO CHEW: 1.0000 | CHEWABLE_TABLET | Freq: Every day | ORAL | Status: DC

## 2014-12-16 MED ORDER — METOCLOPRAMIDE HCL 5 MG/ML IJ SOLN
10.0000 mg | Freq: Four times a day (QID) | INTRAMUSCULAR | Status: AC
  Administered 2014-12-16 – 2014-12-17 (×4): 10 mg via INTRAVENOUS
  Filled 2014-12-16 (×5): qty 2

## 2014-12-16 NOTE — Progress Notes (Signed)
1 Day Post-Op Procedure(s) (LRB): LEFT VIDEO ASSISTED THORACOSCOPY (Left) LEFT UPPER LOBECTOMY (Left) CRYO INTERCOSTAL NERVE BLOCK, LEFT (Left) NODE DISSECTION (Left) Subjective: Some pain from chest tubes Denies nausea  Objective: Vital signs in last 24 hours: Temp:  [97 F (36.1 C)-98.9 F (37.2 C)] 98.9 F (37.2 C) (07/12 0750) Pulse Rate:  [68-100] 82 (07/12 0600) Cardiac Rhythm:  [-] Normal sinus rhythm (07/12 0600) Resp:  [15-28] 25 (07/12 0600) BP: (94-123)/(55-75) 109/61 mmHg (07/12 0600) SpO2:  [91 %-100 %] 94 % (07/12 0724) Arterial Line BP: (73-147)/(51-90) 73/65 mmHg (07/12 0600)  Hemodynamic parameters for last 24 hours:    Intake/Output from previous day: 07/11 0701 - 07/12 0700 In: 4598.3 [I.V.:4548.3; IV Piggyback:50] Out: 3300 [Urine:2100; Blood:1250; Chest Tube:300] Intake/Output this shift:    General appearance: alert, cooperative and mild distress Neurologic: intact Heart: regular rate and rhythm Lungs: diminished breath sounds left base Abdomen: mildly distended, tympanitic, nontender small air leak, serosaguinous output from CT  Lab Results:  Recent Labs  12/15/14 1202 12/16/14 0508  WBC  --  10.0  HGB 10.2* 11.2*  HCT 30.0* 34.0*  PLT  --  160   BMET:  Recent Labs  12/15/14 1202 12/16/14 0508  NA 140 139  K 4.4 4.0  CL  --  107  CO2  --  27  GLUCOSE 182* 151*  BUN  --  8  CREATININE  --  0.77  CALCIUM  --  7.8*    PT/INR: No results for input(s): LABPROT, INR in the last 72 hours. ABG    Component Value Date/Time   PHART 7.400 12/16/2014 0521   HCO3 26.9* 12/16/2014 0521   TCO2 28 12/16/2014 0521   O2SAT 91.0 12/16/2014 0521   CBG (last 3)   Recent Labs  12/15/14 1809 12/15/14 2326 12/16/14 0630  GLUCAP 122* 128* 137*    Assessment/Plan: S/P Procedure(s) (LRB): LEFT VIDEO ASSISTED THORACOSCOPY (Left) LEFT UPPER LOBECTOMY (Left) CRYO INTERCOSTAL NERVE BLOCK, LEFT (Left) NODE DISSECTION (Left) Plan for  transfer to step-down: see transfer orders   POD # 1  Doing well overall  Chest x ray rotated, some volume loss on left- IS  Small air leak- keep CT to suction today  Some gastric dilatation on CXR, distended on exam- clears, do not push PO  reglan x 24 hours  SCD for DVT prophylaxis- add enoxaparin  Ambulate  PCA for pain control   LOS: 1 day    Jeffrey Hines 12/16/2014

## 2014-12-17 ENCOUNTER — Inpatient Hospital Stay (HOSPITAL_COMMUNITY): Payer: Medicare Other

## 2014-12-17 LAB — COMPREHENSIVE METABOLIC PANEL
ALBUMIN: 2.6 g/dL — AB (ref 3.5–5.0)
ALT: 14 U/L — AB (ref 17–63)
AST: 16 U/L (ref 15–41)
Alkaline Phosphatase: 49 U/L (ref 38–126)
Anion gap: 4 — ABNORMAL LOW (ref 5–15)
BILIRUBIN TOTAL: 0.6 mg/dL (ref 0.3–1.2)
BUN: 7 mg/dL (ref 6–20)
CO2: 30 mmol/L (ref 22–32)
Calcium: 7.7 mg/dL — ABNORMAL LOW (ref 8.9–10.3)
Chloride: 104 mmol/L (ref 101–111)
Creatinine, Ser: 0.84 mg/dL (ref 0.61–1.24)
GFR calc Af Amer: >60 mL/min (ref >60)
GFR calc non Af Amer: >60 mL/min (ref >60)
GLUCOSE: 116 mg/dL — AB (ref 65–99)
Potassium: 3.9 mmol/L (ref 3.5–5.1)
Sodium: 138 mmol/L (ref 135–145)
Total Protein: 5.4 g/dL — ABNORMAL LOW (ref 6.5–8.1)

## 2014-12-17 LAB — CBC
HCT: 32.8 % — ABNORMAL LOW (ref 39.0–52.0)
Hemoglobin: 10.5 g/dL — ABNORMAL LOW (ref 13.0–17.0)
MCH: 30.3 pg (ref 26.0–34.0)
MCHC: 32 g/dL (ref 30.0–36.0)
MCV: 94.5 fL (ref 78.0–100.0)
PLATELETS: 141 10*3/uL — AB (ref 150–400)
RBC: 3.47 MIL/uL — AB (ref 4.22–5.81)
RDW: 12.6 % (ref 11.5–15.5)
WBC: 12.3 10*3/uL — AB (ref 4.0–10.5)

## 2014-12-17 LAB — GLUCOSE, CAPILLARY
GLUCOSE-CAPILLARY: 96 mg/dL (ref 65–99)
GLUCOSE-CAPILLARY: 131 mg/dL — AB (ref 65–99)
GLUCOSE-CAPILLARY: 132 mg/dL — AB (ref 65–99)
GLUCOSE-CAPILLARY: 113 mg/dL — AB (ref 65–99)
Glucose-Capillary: 138 mg/dL — ABNORMAL HIGH (ref 65–99)

## 2014-12-17 MED ORDER — KETOROLAC TROMETHAMINE 15 MG/ML IJ SOLN
15.0000 mg | Freq: Four times a day (QID) | INTRAMUSCULAR | Status: DC
Start: 2014-12-17 — End: 2014-12-21
  Administered 2014-12-17 – 2014-12-21 (×17): 15 mg via INTRAVENOUS
  Filled 2014-12-17 (×21): qty 1

## 2014-12-17 MED ORDER — GUAIFENESIN ER 600 MG PO TB12
1200.0000 mg | ORAL_TABLET | Freq: Two times a day (BID) | ORAL | Status: DC
  Administered 2014-12-17 – 2014-12-21 (×9): 1200 mg via ORAL
  Filled 2014-12-17 (×10): qty 2

## 2014-12-17 NOTE — Progress Notes (Signed)
Fentanyl 17mg IV wasted in sharps. Witnessed by HJarrett Soho RN

## 2014-12-17 NOTE — Progress Notes (Addendum)
[address]       [place] [address]             [phone]          2 Days Post-Op Procedure(s) (LRB): LEFT VIDEO ASSISTED THORACOSCOPY (Left) LEFT UPPER LOBECTOMY (Left) CRYO INTERCOSTAL NERVE BLOCK, LEFT (Left) NODE DISSECTION (Left)  Subjective: "Uncomfortable" this am.  Sore at CT sites and doesn't feel like the PCA is helping.  Passing flatus, tolerating diet.   Objective: Vital signs in last 24 hours: Patient Vitals for the past 24 hrs:  BP Temp Temp src Pulse Resp SpO2  12/17/14 0729 - - - - - 93 %  12/17/14 0400 - - - - (!) 23 95 %  12/17/14 0343 106/63 mmHg - - 89 (!) 30 96 %  12/17/14 0342 - 99.4 F (37.4 C) Oral - - -  12/16/14 2235 107/63 mmHg - - 87 (!) 25 98 %  12/16/14 2234 - 99.5 F (37.5 C) Oral - - -  12/16/14 2000 - - - - (!) 24 96 %  12/16/14 1950 - - - 91 (!) 22 94 %  12/16/14 1942 95/60 mmHg - - 89 (!) 21 96 %  12/16/14 1940 - 99.7 F (37.6 C) Oral - - -  12/16/14 1700 - 99.1 F (37.3 C) Oral - - -  12/16/14 1653 115/66 mmHg - - 83 (!) 27 94 %  12/16/14 1550 - - - - 20 97 %  12/16/14 1200 - - - - 18 98 %  12/16/14 1100 97/60 mmHg - - 84 19 96 %  12/16/14 1000 98/61 mmHg - - 87 (!) 25 95 %  12/16/14 0900 - - - 87 19 94 %  12/16/14 0800 - - - 79 (!) 25 95 %   Current Weight  12/11/14 186 lb (84.369 kg)     Intake/Output from previous day: 07/12 0701 - 07/13 0700 In: 1830 [P.O.:480; I.V.:1350] Out: 2170 [Urine:1900; Chest Tube:270]    PHYSICAL EXAM:  Heart: RRR, mildly tachy around 100 Lungs: Decreased BS on L, but poor inspiratory effort Wound: Dressed and dry Abdomen: mildly distended, soft, NT, +BS Chest tube: No air leak    Lab Results: CBC: Recent Labs  12/16/14 0508 12/17/14 0246  WBC 10.0 12.3*  HGB 11.2* 10.5*  HCT 34.0* 32.8*  PLT 160 141*   BMET:  Recent Labs  12/16/14 0508 12/17/14 0246  NA 139 138  K 4.0 3.9  CL 107 104  CO2 27 30  GLUCOSE 151* 116*  BUN 8 7    CREATININE 0.77 0.84  CALCIUM 7.8* 7.7*    PT/INR: No results for input(s): LABPROT, INR in the last 72 hours.  CXR:  FINDINGS: Left chest tubes in stable position. Postsurgical changes left lung with associated atelectatic changes of the remaining left lung. Small left pleural effusion. No pneumothorax. Right lung clear. Heart size stable.  IMPRESSION: 1. Left chest tubes in stable position. No pneumothorax. 2. Postsurgical changes left lung with associated atelectatic changes throughout the remaining aerated left lung. Right lung is clear.   Assessment/Plan: S/P Procedure(s) (LRB): LEFT VIDEO ASSISTED THORACOSCOPY (Left) LEFT UPPER LOBECTOMY (Left) CRYO INTERCOSTAL NERVE BLOCK, LEFT (Left) NODE DISSECTION (Left) Pulm- sats stable on 2L, needs increased pulm toilet.   CTs with no air leak, CXR with no ptx.  Hopefully can d/c 1 CT today. GI- diet advanced to heart healthy last night and pt tolerating without nausea, but  only taking small amounts.  Continue Protonix, Reglan, Carafate. HTN- BPs stable on home meds. Ambulate in halls today. Pain not well controlled with PCA, will add a few doses of Toradol and watch. Cr stable.   LOS: 2 days    COLLINS,GINA H 12/17/2014  Patient seen and examined, agree with above CXR shows atelectasis of left lower lobe Will add flutter valve and mucinex Dc anterior CT Path pending  Remo Lipps C. Roxan Hockey, MD [facility] [phone]

## 2014-12-17 NOTE — Care Management Important Message (Signed)
Important Message  Patient Details  Name: Jeffrey Hines MRN: [id number] Date of Birth: 10/17/1945   Medicare Important Message Given:  [facility] notification given    Nathen May 12/17/2014, 2:33 [place] Message  Patient Details  Name: Jeffrey Hines MRN: [id number] Date of Birth: 1946-04-26   Medicare Important Message Given:  Yes-second notification given    Nathen May 12/17/2014, 2:33 PM

## 2014-12-18 ENCOUNTER — Inpatient Hospital Stay (HOSPITAL_COMMUNITY): Payer: Medicare Other

## 2014-12-18 LAB — BASIC METABOLIC PANEL
Anion gap: 4 — ABNORMAL LOW (ref 5–15)
BUN: 8 mg/dL (ref 6–20)
CALCIUM: 7.7 mg/dL — AB (ref 8.9–10.3)
CHLORIDE: 106 mmol/L (ref 101–111)
CO2: 28 mmol/L (ref 22–32)
Creatinine, Ser: 0.7 mg/dL (ref 0.61–1.24)
GFR calc Af Amer: >60 mL/min (ref >60)
GFR calc non Af Amer: >60 mL/min (ref >60)
Glucose, Bld: 108 mg/dL — ABNORMAL HIGH (ref 65–99)
Potassium: 3.9 mmol/L (ref 3.5–5.1)
SODIUM: 138 mmol/L (ref 135–145)

## 2014-12-18 LAB — GLUCOSE, CAPILLARY
Glucose-Capillary: 105 mg/dL — ABNORMAL HIGH (ref 65–99)
Glucose-Capillary: 103 mg/dL — ABNORMAL HIGH (ref 65–99)
Glucose-Capillary: 129 mg/dL — ABNORMAL HIGH (ref 65–99)

## 2014-12-18 NOTE — Progress Notes (Signed)
3 Days Post-Op Procedure(s) (LRB): LEFT VIDEO ASSISTED THORACOSCOPY (Left) LEFT UPPER LOBECTOMY (Left) CRYO INTERCOSTAL NERVE BLOCK, LEFT (Left) NODE DISSECTION (Left) Subjective: Pain a little better since CT removed Denies nausea  Objective: Vital signs in last 24 hours: Temp:  [98.1 F (36.7 C)-99.1 F (37.3 C)] 99.1 F (37.3 C) (07/14 0418) Pulse Rate:  [95-109] 100 (07/14 0418) Cardiac Rhythm:  [-] Sinus tachycardia (07/14 0418) Resp:  [14-29] 25 (07/14 0418) BP: (95-114)/(51-67) 114/67 mmHg (07/14 0418) SpO2:  [91 %-96 %] 94 % (07/14 0742)  Hemodynamic parameters for last 24 hours:    Intake/Output from previous day: 07/13 0701 - 07/14 0700 In: 2590 [P.O.:1440; I.V.:1150] Out: 1835 [Urine:1175; Chest Tube:660] Intake/Output this shift:    General appearance: alert and no distress Neurologic: intact Heart: tachy, regular Lungs: diminished breath sounds left side Abdomen: normal findings: soft, non-tender  Lab Results:  Recent Labs  12/16/14 0508 12/17/14 0246  WBC 10.0 12.3*  HGB 11.2* 10.5*  HCT 34.0* 32.8*  PLT 160 141*   BMET:  Recent Labs  12/17/14 0246 12/18/14 0530  NA 138 138  K 3.9 3.9  CL 104 106  CO2 30 28  GLUCOSE 116* 108*  BUN 7 8  CREATININE 0.84 0.70  CALCIUM 7.7* 7.7*    PT/INR: No results for input(s): LABPROT, INR in the last 72 hours. ABG    Component Value Date/Time   PHART 7.400 12/16/2014 0521   HCO3 26.9* 12/16/2014 0521   TCO2 28 12/16/2014 0521   O2SAT 91.0 12/16/2014 0521   CBG (last 3)   Recent Labs  12/17/14 1619 12/17/14 1834 12/17/14 2120  GLUCAP 131* 132* 113*    Assessment/Plan: S/P Procedure(s) (LRB): LEFT VIDEO ASSISTED THORACOSCOPY (Left) LEFT UPPER LOBECTOMY (Left) CRYO INTERCOSTAL NERVE BLOCK, LEFT (Left) NODE DISSECTION (Left) -  CT- no air leak, anterior CT removed yesterday  Keep posterior CT until drainage decreases Still has atelectasis of left lower lobe- IS, flutter, mucinex  If  not better by tomorrow will need a bronch SCD + enoxaparin for DVT prophylaxis PATH- no residual tumor seen- Pt and wife informed. Discussed at [facility]- no additional treatment at this time   LOS: 3 days    Melrose Nakayama 12/18/2014

## 2014-12-18 NOTE — Progress Notes (Signed)
UR COMPLETEDUR COMPLETED 

## 2014-12-18 NOTE — Op Note (Signed)
NAMECRISTINO, DEGROFF              ACCOUNT NO.:  [id number]  MEDICAL RECORD NO.:  [id number]  LOCATION:  3S10C                        FACILITY:  [facility]  PHYSICIAN:  Revonda Standard. 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  PROCEDURE:   Left video-assisted thoracoscopy Wedge resection of left lower lobe Thoracoscopic left upper lobectomy with mediastinal lymph node dissection Cryoanalgesia of intercostal nerves 3 through 7  SURGEON:  Remo Lipps C. Roxan Hockey, MD  ASSISTANT:  Lars Pinks, PA  ANESTHESIA:  General.  FINDINGS:  Adhesions between upper lobe and lower lobe with nodularity in the lower lobe adjacent to the adhesions.  Wedge resection performed nad frozen section showed benign fibrosis. Multiple enlarged nodes with a central mass in the left upper lobe.  Bronchial margin was negative for tumor.  CLINICAL NOTE:  Mr. Faries is a 69 year old man who presented in February with a pneumothorax.  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  He was treated for pneumonia and then underwent neoadjuvant chemotherapy and radiation up to 45 Gy.  A repeat CT scan showed a dramatic clinical response.  His pulmonary function testing also improved with treatment. A repeat bronchoscopy was done on November 24, 2014.  There were some radiation changes of the bronchial mucosa noted, but biopsies from multiple site showed no residual cancer.  The patient opted to proceed with left upper lobectomy as planned initially.  The indications, risks, benefits, and alternatives were discussed in detail with  the patient. He accepted the risks and agreed to proceed.  OPERATIVE NOTE:  Mr. Seliga was brought to the preoperative holding area on December 15, 2014.  Anesthesia placed a central line and arterial blood pressure monitoring line.  He was taken to the operating room, anesthetized, and intubated with a double-lumen endotracheal tube. Intravenous antibiotics were administered.  A Foley catheter was placed. Sequential compressive devices were placed on the calves for DVT prophylaxis.  He was placed in a right lateral decubitus position and the left chest was prepped and draped in usual sterile fashion.  Single lung ventilation of the right lung was initiated and was tolerated well throughout the procedure. A timeout was performed to ensure correct patient, site and procedure.  An incision made in the 7th intercostal space in the midaxillary line. A 5 mm port was inserted into the chest.  The thoracoscope was advanced into the chest.  There were extensive adhesions of the upper lobe to the apex.  A 5-cm working incision was made in the 4th interspace anterolaterally.  No rib spreading was performed during the procedure. The adhesions were taken down with electrocautery.  There were adhesions in the fissure laterally between the upper lobe and the superior segment of the lower lobe.  The visceral pleura appeared abnormal in the superior segmental area and there was also palpable nodularity in the area.  The initial plan was to do a wedge resection of the lower lobe en bloc with the upper lobe. The wedge resection was performed. As this was done, the adhesions came off the upper lobe due to retraction.  The lower lobe specimen was placed into an endoscopic retrieval bag, removed and sent for frozen section which showed benign fibrosis.  No tumor was seen.  While awaiting the frozen result, cryoanalgesia was performed on intercostal nerves 3 through 7.  A 2 cm segment of the cryoprobe was placed  on the pleura overlying the nerve and taken to -60 degrees Celsius for 2 minutes at levels 3 through 7.  The inferior ligament was divided and the pleural reflection was divided anteriorly at the hilum.  The fissure was complete anteriorly. Posteriorly, there was a small portion of the fissure that was incomplete.  Dissection was carried along the pulmonary artery. Nodes that were encountered during the dissection were removed and sent separately for permanent pathology.  The fissure was completed posteriorly with a firing of the endoscopic stapler.  There was a central mass which made dissection along the pulmonary artery difficult. The pulmonary vein branches were exposed.  The lingular branches were dissected out, encircled and divided with the endoscopic vascular stapler.  This allowed then better exposure of the remainder of the upper lobe pulmonary vein branches which were likewise dissected out, encircled, and then divided with the endoscopic vascular stapler. Dissection along the pulmonary artery then was performed both from the anterior approach as well as through the fissure.  There was a large lingular branch with a very small lingular branch adjacent to it.  These arteries were carefully dissected out and divided with the endoscopic vascular stapler.  Posterior and apical branches then were dissected out, encircled, and divided with the approach via the fissure.  There was a large anterior branch which was dissected out and divided from an anterior approach.  There were nodes along the bronchus, which were taken with the specimen.  A 45-mm stapler with the green cartridge was placed across the left upper lobe bronchus and closed.  A test inflation showed good aeration of the lower lobe.  The stapler then was fired transecting the bronchus.  The left upper lobe was placed into an endoscopic retrieval bag and removed through the incision, it was sent for frozen section of the  bronchial margin, which subsequently returned with no tumor seen.  While awaiting the results of the frozen section, the aortopulmonary window was explored, the pleura overlying it was incised.  There was a large level 5 node and several smaller nodes that were taken.  The subcarinal nodes also were sampled.  The chest was copiously irrigated with warm saline.  A test inflation to 30 cm of water revealed no leakage from the bronchial stump.  There were some small tears in the lower lobe parenchyma from retraction that had small air leaks.  A 28-French chest tube was placed through a separate stab incision and directed anteriorly to the apex.  A 28-French Blake drain was placed through the original port incision and directed posteriorly. They were secured at the skin with #1 silk sutures.  The lower lobe was reinflated.  The incision was closed in 3 layers with a #1 Vicryl fascial suture followed by 2-0 Vicryl subcutaneous suture and a 3-0 Vicryl subcuticular suture.  The patient was placed back in a supine position.  The chest tubes were placed to suction.  The patient was extubated in  the operating room and taken to the postanesthetic care unit in good condition.     Revonda Standard Roxan Hockey, M.D.     SCH/MEDQ  D:  12/17/2014  T:  12/18/2014  Job:  [id number]

## 2014-12-19 ENCOUNTER — Inpatient Hospital Stay (HOSPITAL_COMMUNITY): Payer: Medicare Other

## 2014-12-19 LAB — GLUCOSE, CAPILLARY
Glucose-Capillary: 109 mg/dL — ABNORMAL HIGH (ref 65–99)
Glucose-Capillary: 92 mg/dL (ref 65–99)
Glucose-Capillary: 104 mg/dL — ABNORMAL HIGH (ref 65–99)
Glucose-Capillary: 92 mg/dL (ref 65–99)
Glucose-Capillary: 99 mg/dL (ref 65–99)

## 2014-12-19 MED ORDER — LEVOFLOXACIN 500 MG PO TABS
500.0000 mg | ORAL_TABLET | Freq: Every day | ORAL | Status: DC
  Administered 2014-12-19 – 2014-12-21 (×3): 500 mg via ORAL
  Filled 2014-12-19 (×3): qty 1

## 2014-12-19 MED ORDER — LACTULOSE 10 GM/15ML PO SOLN
20.0000 g | Freq: Every day | ORAL | Status: DC | PRN
  Filled 2014-12-19: qty 30

## 2014-12-19 MED ORDER — MAGNESIUM HYDROXIDE 400 MG/5ML PO SUSP
15.0000 mL | Freq: Every day | ORAL | Status: DC | PRN
  Administered 2014-12-19: 15 mL via ORAL
  Filled 2014-12-19: qty 30

## 2014-12-19 NOTE — Progress Notes (Addendum)
[address]       [place], [address]             [phone]          4 Days Post-Op Procedure(s) (LRB): LEFT VIDEO ASSISTED THORACOSCOPY (Left) LEFT UPPER LOBECTOMY (Left) CRYO INTERCOSTAL NERVE BLOCK, LEFT (Left) NODE DISSECTION (Left)  Subjective: OOB in chair.  Off O2 at present, sats 95-97%.  +Productive cough. No BM yet, but passing flatus.   Objective: Vital signs in last 24 hours: Patient Vitals for the past 24 hrs:  BP Temp Temp src Pulse Resp SpO2  12/19/14 0800 104/67 mmHg - - - - -  12/19/14 0432 112/62 mmHg 97.7 F (36.5 C) Oral 71 16 99 %  12/19/14 0400 - - - - (!) 21 98 %  12/19/14 0001 112/63 mmHg 99.9 F (37.7 C) Oral 96 (!) 24 94 %  12/19/14 0000 - - - - (!) 23 97 %  12/18/14 2124 - - - 84 (!) 24 94 %  12/18/14 2000 - - - - (!) 23 94 %  12/18/14 1956 102/60 mmHg 98.3 F (36.8 C) Oral 91 (!) 22 96 %  12/18/14 1656 - 97.9 F (36.6 C) Oral - - -  12/18/14 1638 (!) 100/51 mmHg - - - (!) 25 -  12/18/14 1240 (!) 91/56 mmHg 98 F (36.7 C) - - - -  12/18/14 1115 - - - 84 (!) 21 100 %   Current Weight  12/11/14 186 lb (84.369 kg)     Intake/Output from previous day: 07/14 0701 - 07/15 0700 In: 1840 [P.O.:1440; I.V.:400] Out: 2255 [Urine:1825; Chest Tube:430]    PHYSICAL EXAM:  Heart: RRR Lungs: Decreased BS in L base, few scattered rhonchi Wound: Clean and dry Chest tube: No air leak    Lab Results: CBC: Recent Labs  12/17/14 0246  WBC 12.3*  HGB 10.5*  HCT 32.8*  PLT 141*   BMET:  Recent Labs  12/17/14 0246 12/18/14 0530  NA 138 138  K 3.9 3.9  CL 104 106  CO2 30 28  GLUCOSE 116* 108*  BUN 7 8  CREATININE 0.84 0.70  CALCIUM 7.7* 7.7*    PT/INR: No results for input(s): LABPROT, INR in the last 72 hours.  CXR: FINDINGS: There remains a chest tube on the left. There is loculated air in the left apex region, unchanged. There is volume loss on the left with shift of the heart and mediastinum  toward the left. The right lung is hyperexpanded but clear. Central catheter tip is in the superior vena cava. Heart size is normal. Pulmonary vascular on the right is normal. Pulmonary vascularity on the left is distorted due the postoperative change. No adenopathy appreciable. Postoperative rib defects are again noted on the left, unchanged.  IMPRESSION: Extensive volume loss on the left. Persistent loculated air left apex. Right lung hyperexpanded but clear. No change in cardiac silhouette. No change from 1 day prior.   Assessment/Plan: S/P Procedure(s) (LRB): LEFT VIDEO ASSISTED THORACOSCOPY (Left) LEFT UPPER LOBECTOMY (Left) CRYO INTERCOSTAL NERVE BLOCK, LEFT (Left) NODE DISSECTION (Left) CT output 430 ml/24 hrs (280->150 over past shifts) . No air leak. Continue CT until drainage has decreased further.  CXR stable, sats good. Continue aggressive pulm toilet/IS/FV/Mucinex.  Hopefully can avoid bronch. Constipation- LOC today.   LOS: 4 days    COLLINS,GINA H 12/19/2014  Patient seen and examined, agree with above Despite radiology report the chest x ray  shows much better aeration of the left lower lobe today after being almost completely atelectatic yesterday. I do not think he will need bronchoscopy Continue mucinex and flutter valve He does have thick secretions- will start empiric levaquin for presumed bronchitis Drainage trending down- dc CT when < 200/ day  Remo Lipps C. Roxan Hockey, MD [facility] [phone]

## 2014-12-19 NOTE — Discharge Instructions (Signed)
Video-Assisted Thoracic Surgery °Care After °Refer to this sheet in the next few weeks. These instructions provide you with information on caring for yourself after your procedure. Your caregiver may also give you more specific instructions. Your procedure has been planned according to current medical practices, but problems sometimes occur. Call your caregiver if you have any problems or questions after your procedure. °HOME CARE INSTRUCTIONS  °· Only take over-the-counter or prescription medications as directed. °· Only take pain medications (narcotics) as directed. °· Do not drive until your caregiver approves. Driving while taking narcotics or soon after surgery can be dangerous, so discuss the specific timing with your caregiver. °· Avoid activities that use your chest muscles, such as lifting heavy objects, for at least 3-4 weeks.   °· Take deep breaths to expand the lungs and to protect against pneumonia. °· Do breathing exercises as directed by your caregiver. If you were given an incentive spirometer to help with breathing, use it as directed. °· You may resume a normal diet and activities when you feel you are able to or as directed. °· Do not take a bath until your caregiver says it is OK. Use the shower instead.   °· Keep the bandage (dressing) covering the area where the chest tube was inserted (incision site) dry for 48 hours. After 48 hours, remove the dressing unless there is new drainage. °· Remove dressings as directed by your caregiver. °· Change dressings if necessary or as directed. °· Keep all follow-up appointments. It is important for you to see your caregiver after surgery to discuss appropriate follow-up care and surveillance, if it is necessary. °SEEK MEDICAL CARE: °· You feel excessive or increasing pain at an incision site. °· You notice bleeding, skin irritation, drainage, swelling, or redness at an incision site. °· There is a bad smell coming from an incision or dressing. °· It feels  like your heart is fluttering or beating rapidly. °· Your pain medication does not relieve your pain. °SEEK IMMEDIATE MEDICAL CARE IF:  °· You have a fever.   °· You have chest pain.  °· You have a rash. °· You have shortness of breath. °· You have trouble breathing.   °· You feel weak, lightheaded, dizzy, or faint.   °MAKE SURE YOU:  °· Understand these instructions.   °· Will watch your condition.   °· Will get help right away if you are not doing well or get worse. °Document Released: 09/17/2012 Document Reviewed: 09/17/2012 °ExitCare® Patient Information ©2015 [employer]. This information is not intended to replace advice given to you by your health care provider. Make sure you discuss any questions you have with your health care provider. ° °

## 2014-12-19 NOTE — Discharge Summary (Signed)
Physician Discharge Summary  Patient ID: Jeffrey Hines MRN: [id number] DOB/AGE: Jul 06, 1945 69 y.o.  Admit date: 12/15/2014 Discharge date: 12/21/2014  Admission Diagnoses:  Patient Active Problem List   Diagnosis Date Noted  . Lung cancer 12/15/2014  . Acute on chronic respiratory failure with hypoxemia 08/19/2014  . Malnutrition of moderate degree 08/08/2014  . Post-obstruction pneumonia due to Pseudomonas aeruginosa 08/07/2014  . Squamous cell carcinoma of lung, stage II   . Anxiety state   . Malignant neoplasm of upper lobe of left lung   . Lung mass   . COPD with chronic bronchitis   . Tobacco abuse   . Hypertension    Discharge Diagnoses:   Patient Active Problem List   Diagnosis Date Noted  . Lung cancer 12/15/2014  . Acute on chronic respiratory failure with hypoxemia 08/19/2014  . Malnutrition of moderate degree 08/08/2014  . Post-obstruction pneumonia due to Pseudomonas aeruginosa 08/07/2014  . Squamous cell carcinoma of lung, stage II   . Anxiety state   . Malignant neoplasm of upper lobe of left lung   . Lung mass   . COPD with chronic bronchitis   . Tobacco abuse   . Hypertension    Discharged Condition: good  History of Present Illness:  Jeffrey Hines is a 69 yo male with history of COPD and heavy tobacco abuse.  He was referred to [facility] for evaluation of left upper lobe mass.  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  The patient underwent neo-adjuvant chemotherapy and radiation.   Repeat CT scan from May showed a dramatic response to treatment.  He has been followed by Dr. Roxan Hockey since February.  In June the patient was still using home oxygen at night.  He experienced weight loss and states he was experiencing some dysphagia due to the radiation.  This was also associated with frequent heartburn.  He continued to have dyspnea with  exertion, weakness with exertion, and some hemoptysis which has improved since starting radiation.  He underwent repeat Bronchoscopy 11/24/2014 which showed radiation changes in the left mainstem and upper lobe bronchus.  Biopsies were obtained and showed benign bronchial lung tissue.  Dr. Roxan Hockey met with the patient and his family, he explained that the biopsy results do not rule out the possibility of residual cancer.  It was however felt there was potential for complete resection.  It was recommended the patient undergo Left VATS with lobectomy.  They understood that resection may not be possible.  The risks and benefits of the procedure were explained to the patient and his family and they were agreeable to proceed.    Hospital Course:   Mr. Thoma presented to [facility] on 12/15/2014.  He was taken to the operating room and underwent Left Video Assisted Thoracoscopy with wedge resection of left lower lobe, Left upper lobectomy, lymph node dissection, and cryoanalgesia of intercostal nerves.  The patient tolerated the procedure without difficulty.  He was extubated and taken to the PACU in stable condition.  The patient remained stable during his post operative course.  He developed some mild GI distention which resolved without intervention.  His chest tubes had a small air leak post operatively.  This resolved on POD #2.  At that time his anterior chest tube was removed without difficulty.  His remaining chest tube did not exhibit air leak.  He was  weaned off suction.  However, his chest tube had moderately high output.  Therefore, his chest tube was left in place until <200 cc ouptut remained.  Follow up CXR post chest tube removal remained stable and was free from pneumothorax.  Pathology from the operating room did not show evidence of residual tumor.  This was discussed with his oncologist and it was felt no further treatment would be indicated at this time.  The patient developed  thickened secretions.  He was started on empiric Levaquin for possible bronchitis.  He continues to do well.  He is ambulating without much difficulty.  He is tolerating a heart healthy diet.  His pain is well controlled.  He was unable to be weaned from oxygen.  Home continuous use was arranged.  He has been using oxygen at night on an outpatient basis.  He is felt medically stable for discharge home today 12/21/14.       Significant Diagnostic Studies: radiology: CT scan:  Interval decrease in size of left hilar mass with re- aeration of the left upper lobe.  Fluid-filled tubular structure within the left lung most compatible loculated fluid along the fissure. Recommend attention on followup.  Persistent subpleural opacity right lower lobe, potentially secondary to scarring. Recommend attention on followup.  New focus of ground-glass opacity within the anterior right upper lobe may represent an infectious or inflammatory process. Recommend attention on followup.  Treatments: surgery:   LEFT VIDEO ASSISTED THORACOSCOPY, WEDGE RESECTION LEFT LOWER LOBE, THORACOSCOPIC LEFT UPPER LOBECTOMY, CRYO INTERCOSTAL NERVE BLOCK 3-7, MEDIASTINAL LYMPH NODE DISSECTION  Disposition: 01-Home or Self Care   Discharge Medications:     Medication List    STOP taking these medications        HYDROcodone-acetaminophen 5-325 MG per tablet  Commonly known as:  NORCO      TAKE these medications        amLODipine 10 MG tablet  Commonly known as:  NORVASC  Take 1 tablet (10 mg total) by mouth daily.     BIOFLEX PO  Take 1 tablet by mouth 2 (two) times daily.     clonazePAM 1 MG tablet  Commonly known as:  KLONOPIN  Take 1 mg by mouth 2 (two) times daily.     dextromethorphan-guaiFENesin 30-600 MG per 12 hr tablet  Commonly known as:  MUCINEX DM  Take 1 tablet by mouth every 12 (twelve) hours.     guaiFENesin 600 MG 12 hr tablet  Commonly known as:  MUCINEX  Take 2 tablets (1,200 mg  total) by mouth 2 (two) times daily as needed.     ibuprofen 200 MG tablet  Commonly known as:  ADVIL,MOTRIN  Take 400 mg by mouth 2 (two) times daily.     ipratropium-albuterol 0.5-2.5 (3) MG/3ML Soln  Commonly known as:  DUONEB  Take 3 mLs by nebulization 4 (four) times daily as needed (Wheezing, shortness of breath).     levofloxacin 500 MG tablet  Commonly known as:  LEVAQUIN  Take 1 tablet (500 mg total) by mouth daily.     loratadine 10 MG tablet  Commonly known as:  CLARITIN  Take 10 mg by mouth every 12 (twelve) hours.     multivitamin-iron-minerals-folic acid chewable tablet  Chew 1 tablet by mouth daily.     omeprazole 20 MG capsule  Commonly known as:  PRILOSEC  Take 20 mg by mouth daily.     oxyCODONE 5 MG immediate release tablet  Commonly known as:  Oxy  IR/ROXICODONE  Take 1-2 tablets (5-10 mg total) by mouth every 4 (four) hours as needed for severe pain.     SAW PALMETTO PO  Take 2 tablets by mouth 3 (three) times daily.     ST JOHNS WORT PO  Take 1 tablet by mouth 3 (three) times daily.     sucralfate 1 GM/10ML suspension  Commonly known as:  CARAFATE  Take 10 mLs (1 g total) by mouth 4 (four) times daily -  with meals and at bedtime.     SYMBICORT 160-4.5 MCG/ACT inhaler  Generic drug:  budesonide-formoterol  Inhale 2 puffs into the lungs 2 (two) times daily.     traMADol 50 MG tablet  Commonly known as:  ULTRAM  Take 1-2 tablets (50-100 mg total) by mouth every 6 (six) hours as needed (mild pain).     vitamin C 1000 MG tablet  Take 1,000 mg by mouth daily.       Follow-up Information    Follow up with Melrose Nakayama, MD On 01/13/2015.   Specialty:  Cardiothoracic Surgery   Why:  Appointment is at 4:00   Contact information:   [address] [place] [address] [phone]       Follow up with [facility] On 01/13/2015.   Why:  Please get CXR at 3:00   Contact information:   [facility]        Signed: Ellwood Handler 12/21/2014, 10:53 AM

## 2014-12-20 ENCOUNTER — Inpatient Hospital Stay (HOSPITAL_COMMUNITY): Payer: Medicare Other

## 2014-12-20 LAB — CBC
HCT: 29.3 % — ABNORMAL LOW (ref 39.0–52.0)
Hemoglobin: 9.6 g/dL — ABNORMAL LOW (ref 13.0–17.0)
MCH: 30.3 pg (ref 26.0–34.0)
MCHC: 32.8 g/dL (ref 30.0–36.0)
MCV: 92.4 fL (ref 78.0–100.0)
PLATELETS: 195 10*3/uL (ref 150–400)
RBC: 3.17 MIL/uL — ABNORMAL LOW (ref 4.22–5.81)
RDW: 12.4 % (ref 11.5–15.5)
WBC: 5.6 10*3/uL (ref 4.0–10.5)

## 2014-12-20 LAB — BASIC METABOLIC PANEL
Anion gap: 5 (ref 5–15)
BUN: 12 mg/dL (ref 6–20)
CALCIUM: 8.3 mg/dL — AB (ref 8.9–10.3)
CO2: 32 mmol/L (ref 22–32)
Chloride: 102 mmol/L (ref 101–111)
Creatinine, Ser: 0.84 mg/dL (ref 0.61–1.24)
GFR calc Af Amer: >60 mL/min (ref >60)
GFR calc non Af Amer: >60 mL/min (ref >60)
Glucose, Bld: 118 mg/dL — ABNORMAL HIGH (ref 65–99)
POTASSIUM: 5 mmol/L (ref 3.5–5.1)
Sodium: 139 mmol/L (ref 135–145)

## 2014-12-20 LAB — GLUCOSE, CAPILLARY
GLUCOSE-CAPILLARY: 99 mg/dL (ref 65–99)
Glucose-Capillary: 113 mg/dL — ABNORMAL HIGH (ref 65–99)
Glucose-Capillary: 119 mg/dL — ABNORMAL HIGH (ref 65–99)
Glucose-Capillary: 96 mg/dL (ref 65–99)

## 2014-12-20 NOTE — Progress Notes (Signed)
Pt off the monitor. This RN went in to assess pt. And found pt. Confused and agitated. Central line was pulled out by patient. This RN applied an occlusive dressing and reoriented the pt. VSS no complaints at this time. Breath sounds clear and diminished. MD Roxy Manns notified and gave no new orders. Will continue to monitor pt.

## 2014-12-20 NOTE — Progress Notes (Addendum)
      [address]       [place] [address]             [phone]      5 Days Post-Op Procedure(s) (LRB): LEFT VIDEO ASSISTED THORACOSCOPY (Left) LEFT UPPER LOBECTOMY (Left) CRYO INTERCOSTAL NERVE BLOCK, LEFT (Left) NODE DISSECTION (Left)   Subjective:  Mr. Urbas has no new complaints.  He had an episode of agitation and confusion overnight and pulled his central line out.   + ambulation  No BM, + flatus  Objective: Vital signs in last 24 hours: Temp:  [97.6 F (36.4 C)-99.8 F (37.7 C)] 97.8 F (36.6 C) (07/16 0414) Pulse Rate:  [74-120] 74 (07/16 0824) Cardiac Rhythm:  [-] Normal sinus rhythm (07/16 0745) Resp:  [18-30] 18 (07/16 0824) BP: (100-126)/(62-71) 100/66 mmHg (07/16 0824) SpO2:  [93 %-99 %] 99 % (07/16 1005)  Hemodynamic parameters for last 24 hours:    Intake/Output from previous day: 07/15 0701 - 07/16 0700 In: 382.2 [P.O.:240; I.V.:142.2] Out: 3900 [Urine:3725; Chest Tube:175]   Intake/Output this shift: Total I/O In: -  Out: 640 [Urine:600; Chest Tube:40]  General appearance: alert, cooperative and no distress Heart: regular rate and rhythm Lungs: diminished breath sounds left base and rhonchi scattered Abdomen: soft, non-tender; bowel sounds normal; no masses,  no organomegaly Extremities: extremities normal, atraumatic, no cyanosis or edema Wound: clean and dry  Lab Results:  Recent Labs  12/20/14 0443  WBC 5.6  HGB 9.6*  HCT 29.3*  PLT 195   BMET:  Recent Labs  12/18/14 0530 12/20/14 0443  NA 138 139  K 3.9 5.0  CL 106 102  CO2 28 32  GLUCOSE 108* 118*  BUN 8 12  CREATININE 0.70 0.84  CALCIUM 7.7* 8.3*    PT/INR: No results for input(s): LABPROT, INR in the last 72 hours. ABG    Component Value Date/Time   PHART 7.400 12/16/2014 0521   HCO3 26.9* 12/16/2014 0521   TCO2 28 12/16/2014 0521   O2SAT 91.0 12/16/2014 0521   CBG (last 3)   Recent Labs  12/19/14 1626 12/19/14 2157 12/20/14 0823    GLUCAP 92 99 99    Assessment/Plan: S/P Procedure(s) (LRB): LEFT VIDEO ASSISTED THORACOSCOPY (Left) LEFT UPPER LOBECTOMY (Left) CRYO INTERCOSTAL NERVE BLOCK, LEFT (Left) NODE DISSECTION (Left)  1. Chest tube- 175 cc output yesterday- will plan to d/c 2. Pulm- uses home oxygen at night, good use of IS- wean oxygen as tolerated-CXR stable 3. LOC Constipation- continue Lactulose, MOM 4. ID- suspected bronchitis with thickened secretions, continue Levaquin 5. Dispo- will plan to d/c chest tube today if okay with Dr. Roxy Manns, continue aggressive pulmonary toilet, repeat CXR in AM    LOS: 5 days    BARRETT, [place] 12/20/2014  I have seen and examined the patient and agree with the assessment and plan as outlined.  D/C chest tube.  Possible D/C home 1-2 days.  Rexene Alberts 12/20/2014 12:05 PM

## 2014-12-21 ENCOUNTER — Inpatient Hospital Stay (HOSPITAL_COMMUNITY): Payer: Medicare Other

## 2014-12-21 LAB — GLUCOSE, CAPILLARY
Glucose-Capillary: 101 mg/dL — ABNORMAL HIGH (ref 65–99)
Glucose-Capillary: 124 mg/dL — ABNORMAL HIGH (ref 65–99)

## 2014-12-21 MED ORDER — GUAIFENESIN ER 600 MG PO TB12: 1200.0000 mg | ORAL_TABLET | Freq: Two times a day (BID) | ORAL | Status: DC | PRN

## 2014-12-21 MED ORDER — TRAMADOL HCL 50 MG PO TABS: 50.0000 mg | ORAL_TABLET | Freq: Four times a day (QID) | ORAL | Status: DC | PRN

## 2014-12-21 MED ORDER — OXYCODONE HCL 5 MG PO TABS: 5.0000 mg | ORAL_TABLET | ORAL | Status: DC | PRN

## 2014-12-21 MED ORDER — LEVOFLOXACIN 500 MG PO TABS: 500.0000 mg | ORAL_TABLET | Freq: Every day | ORAL | Status: DC

## 2014-12-21 NOTE — Progress Notes (Signed)
Wasted 12cc fentanyl pca syringe waste  In sink, witnessed Jaicee Michelotti rn and celenia sosa rn.

## 2014-12-21 NOTE — Progress Notes (Signed)
Upon discharge patient accidentally took home both copies of the signed discharge paperwork.  An extra copy has been printed and placed in the chart.

## 2014-12-21 NOTE — Care Management Note (Signed)
Case Management Note  Patient Details  Name: Lovis More MRN: [id number] Date of Birth: 1945-07-10  Subjective/Objective:                    Action/Plan: Discharge planning  Expected Discharge Date:  12/21/14               Expected Discharge Plan:  Home/Self Care  In-House Referral:     Discharge planning Services     Post Acute Care Choice:    Choice offered to:     DME Arranged:  Oxygen DME Agency:  [place]:    [facility] The Agency:     Status of Service:  Completed, signed off  Medicare Important Message Given:  Yes-second notification given Date Medicare IM Given:    Medicare IM give by:    Date Additional Medicare IM Given:    Additional Medicare Important Message give by:     If discussed at [place] of Stay Meetings, dates discussed:    Additional Comments: CM met with pt and wife in room who state they have a tank for transport home.  CM called [facility] DME rep, Germaine to notify flow rate is changed and CM was requested  To fax new order to [facility].  CM faxed order to [facility].  No other CM needs were communicated. Dellie Catholic, RN 12/21/2014, 3:24 PM

## 2014-12-21 NOTE — Progress Notes (Signed)
Discharge instructions given to patient and wife all questions answered at this time.  Pt. VSS with no s/s of distress noted.  Patient stable at discharge.

## 2014-12-21 NOTE — Progress Notes (Addendum)
      [address]       [place] [address]             [phone]      6 Days Post-Op Procedure(s) (LRB): LEFT VIDEO ASSISTED THORACOSCOPY (Left) LEFT UPPER LOBECTOMY (Left) CRYO INTERCOSTAL NERVE BLOCK, LEFT (Left) NODE DISSECTION (Left)   Subjective:  Jeffrey Hines is tired this morning.  He states they woke him up around 5 this morning to walk him.  He wants to go home today.  Objective: Vital signs in last 24 hours: Temp:  [97.5 F (36.4 C)-98.1 F (36.7 C)] 97.8 F (36.6 C) (07/17 0828) Pulse Rate:  [78-99] 93 (07/17 0828) Cardiac Rhythm:  [-] Normal sinus rhythm (07/17 0745) Resp:  [15-27] 23 (07/17 0828) BP: (98-129)/(56-88) 123/84 mmHg (07/17 0828) SpO2:  [92 %-98 %] 96 % (07/17 0857)  Intake/Output from previous day: 07/16 0701 - 07/17 0700 In: 230 [I.V.:230] Out: 2390 [Urine:2350; Chest Tube:40] Intake/Output this shift: Total I/O In: 20 [I.V.:20] Out: 500 [Urine:500]  General appearance: alert, cooperative and no distress Heart: regular rate and rhythm Lungs: diminished breath sounds left base  Abdomen: soft, non-tender; bowel sounds normal; no masses,  no organomegaly Wound: clean and dry  Lab Results:  Recent Labs  12/20/14 0443  WBC 5.6  HGB 9.6*  HCT 29.3*  PLT 195   BMET:  Recent Labs  12/20/14 0443  NA 139  K 5.0  CL 102  CO2 32  GLUCOSE 118*  BUN 12  CREATININE 0.84  CALCIUM 8.3*    PT/INR: No results for input(s): LABPROT, INR in the last 72 hours. ABG    Component Value Date/Time   PHART 7.400 12/16/2014 0521   HCO3 26.9* 12/16/2014 0521   TCO2 28 12/16/2014 0521   O2SAT 91.0 12/16/2014 0521   CBG (last 3)   Recent Labs  12/20/14 1641 12/20/14 2142 12/21/14 0811  GLUCAP 119* 96 101*    Assessment/Plan: S/P Procedure(s) (LRB): LEFT VIDEO ASSISTED THORACOSCOPY (Left) LEFT UPPER LOBECTOMY (Left) CRYO INTERCOSTAL NERVE BLOCK, LEFT (Left) NODE DISSECTION (Left)  1. Chest tube- removed  yesterday, CXR remains stable 2. Pulm- uses oxygen at night, currently at 1L, will check Sats and try to wean 3. LOC constipation- resolved 4. Dispo- patient stable, wants to be discharged today, will check oxygen sats, may need oxygen during day for short period of time   LOS: 6 days    BARRETT, ERIN 12/21/2014  I have seen and examined the patient and agree with the assessment and plan as outlined.  D/C home today  Rexene Alberts 12/21/2014 11:26 AM

## 2014-12-29 ENCOUNTER — Other Ambulatory Visit: Payer: Self-pay | Admitting: *Deleted

## 2014-12-29 DIAGNOSIS — G8918 Other acute postprocedural pain: Secondary | ICD-10-CM

## 2014-12-29 MED ORDER — HYDROCODONE-ACETAMINOPHEN 7.5-325 MG PO TABS: 1.0000 | ORAL_TABLET | ORAL | Status: DC | PRN

## 2014-12-29 NOTE — Progress Notes (Signed)
Jeffrey Hines has called for a refill for pain.  He is requesting hydrocodone instead of oxycodone because it is less constipating.  In reviewing his meds, he said he was taking the Tramadol and  Oxycodone together.  I advised him against this. He decided he didn't need the Tramadol at this time.  I informed him that a new signed script would be available at our office today and he agreed.

## 2015-01-05 ENCOUNTER — Other Ambulatory Visit: Payer: Self-pay | Admitting: *Deleted

## 2015-01-05 DIAGNOSIS — G8918 Other acute postprocedural pain: Secondary | ICD-10-CM

## 2015-01-05 MED ORDER — HYDROCODONE-ACETAMINOPHEN 7.5-325 MG PO TABS: 1.0000 | ORAL_TABLET | ORAL | Status: DC | PRN

## 2015-01-05 NOTE — Progress Notes (Signed)
Jeffrey Hines has called for a refill for his pain med s/p thoracic surgery.  I informed him that new signed script would be available today at the front desk and he agreed.

## 2015-01-12 ENCOUNTER — Other Ambulatory Visit: Payer: Self-pay | Admitting: Thoracic Surgery (Cardiothoracic Vascular Surgery)

## 2015-01-12 DIAGNOSIS — C3490 Malignant neoplasm of unspecified part of unspecified bronchus or lung: Secondary | ICD-10-CM

## 2015-01-13 ENCOUNTER — Ambulatory Visit
Admission: RE | Admit: 2015-01-13 | Discharge: 2015-01-13 | Disposition: A | Discharge status: Home / Self Care | Payer: Medicare Other | Source: Ambulatory Visit | Attending: Thoracic Surgery (Cardiothoracic Vascular Surgery) | Admitting: Thoracic Surgery (Cardiothoracic Vascular Surgery)

## 2015-01-13 ENCOUNTER — Encounter: Payer: Self-pay | Admitting: Thoracic Surgery (Cardiothoracic Vascular Surgery)

## 2015-01-13 ENCOUNTER — Ambulatory Visit (INDEPENDENT_AMBULATORY_CARE_PROVIDER_SITE_OTHER): Payer: Self-pay | Admitting: Thoracic Surgery (Cardiothoracic Vascular Surgery)

## 2015-01-13 VITALS — BP 115/73 | HR 100 | Resp 20 | Ht 71.0 in | Wt 181.0 lb

## 2015-01-13 DIAGNOSIS — C3490 Malignant neoplasm of unspecified part of unspecified bronchus or lung: Secondary | ICD-10-CM

## 2015-01-13 DIAGNOSIS — C3492 Malignant neoplasm of unspecified part of left bronchus or lung: Secondary | ICD-10-CM | POA: Diagnosis not present

## 2015-01-13 DIAGNOSIS — G8918 Other acute postprocedural pain: Secondary | ICD-10-CM

## 2015-01-13 DIAGNOSIS — J948 Other specified pleural conditions: Secondary | ICD-10-CM | POA: Diagnosis not present

## 2015-01-13 MED ORDER — HYDROCODONE-ACETAMINOPHEN 7.5-325 MG PO TABS: 1.0000 | ORAL_TABLET | ORAL | Status: DC | PRN

## 2015-01-13 NOTE — Progress Notes (Signed)
[address]       [place], [address]             [phone]       HPI: Mr. Jeffrey Hines returns today for a scheduled follow-up visit.  He is a 69 year old man who was diagnosed with a stage IIb squamous cell carcinoma of the lung earlier this year. He was treated with neoadjuvant chemoradiation (up to 45 Gy). I did a thoracoscopic left upper lobectomy on 12/15/2014. Pathology showed no residual viable tumor. He was discharged on postoperative day #6  He has been having a great deal of difficulty with incisional pain. He's taking a 7.5 mg Norco tablet about every 4 hours. He is about out of his pain medication. He has a history of anxiety and a history of alcoholism. He says he was able to get his incentive spirometer up to the very top.  Past Medical History  Diagnosis Date  . COPD (chronic obstructive pulmonary disease)   . Closed head injury 1998  . Multiple rib fractures 1998    left side  . Shoulder dislocation 1998    left  . COPD with chronic bronchitis   . Tobacco abuse   . Hypertension   . Spontaneous pneumothorax 08/03/2014    Left side 1st time episode spontaneous pneumothorax associated with acute flare of COPD   . Hemoptysis 08/03/2014  . Post-obstruction pneumonia due to Pseudomonas aeruginosa 08/07/2014    Endobronchial mass causing LUL obstruction  . Anxiety   . Radiation 08/25/14-09/29/14    left upper central lung 45 Gy  . Cancer   . Seizures   . Shortness of breath dyspnea   . GERD (gastroesophageal reflux disease)   . Chronic lower back pain   . Constipation due to opioid therapy   . Full dentures   . On supplemental oxygen therapy     2L of O2 at night      Current Outpatient Prescriptions  Medication Sig Dispense Refill  . amLODipine (NORVASC) 10 MG tablet Take 1 tablet (10 mg total) by mouth daily. 30 tablet 1  . Ascorbic Acid (VITAMIN C) 1000 MG tablet Take 1,000 mg by mouth daily.    Marland Kitchen Bioflavonoid Products (BIOFLEX PO) Take 1  tablet by mouth 2 (two) times daily.     . clonazePAM (KLONOPIN) 1 MG tablet Take 1 mg by mouth 2 (two) times daily.    Marland Kitchen HYDROcodone-acetaminophen (NORCO) 7.5-325 MG per tablet Take 1 tablet by mouth every 4 (four) hours as needed for moderate pain. 50 tablet 0  . ibuprofen (ADVIL,MOTRIN) 200 MG tablet Take 400 mg by mouth 2 (two) times daily.     Marland Kitchen ipratropium-albuterol (DUONEB) 0.5-2.5 (3) MG/3ML SOLN Take 3 mLs by nebulization 4 (four) times daily as needed (Wheezing, shortness of breath).   5  . loratadine (CLARITIN) 10 MG tablet Take 10 mg by mouth every 12 (twelve) hours.    . multivitamin-iron-minerals-folic acid (CENTRUM) chewable tablet Chew 1 tablet by mouth daily.    Marland Kitchen omeprazole (PRILOSEC) 20 MG capsule Take 20 mg by mouth daily.    . Saw Palmetto, Serenoa repens, (SAW PALMETTO PO) Take 2 tablets by mouth 3 (three) times daily.    . ST JOHNS WORT PO Take 1 tablet by mouth 3 (three) times daily.    . sucralfate (CARAFATE) 1 GM/10ML suspension Take 10 mLs (1 g total) by mouth 4 (four) times daily -  with meals and at bedtime. (Patient  taking differently: Take 1 g by mouth as needed. ) 420 mL 0  . SYMBICORT 160-4.5 MCG/ACT inhaler Inhale 2 puffs into the lungs 2 (two) times daily.     No current facility-administered medications for this visit.    Physical Exam BP 115/73 mmHg  Pulse 100  Resp 20  Ht '5\' 11"'$  (1.803 m)  Wt 181 lb (82.101 kg)  BMI 25.26 kg/m2  SpO88 39% 69 year old man very anxious and uncomfortable with movement Alert and oriented 3 Incisions healing well Lungs with diminished rest sounds at left base and apex Cardiac regular rate and rhythm normal S1 and S2 No peripheral edema  Diagnostic Tests: CHEST 2 VIEW  COMPARISON: December 21, 2014.  FINDINGS: Status post left upper lobectomy. Surgical clips are noted in the left hilar region. There is interval development of hydropneumothorax in left upper lobe. Old left rib fractures are noted. Mediastinal  shift to the left is noted with hyperexpansion of the right lung. Right lung is otherwise clear. Elevated left hemidiaphragm is noted with probable subsegmental atelectasis.  IMPRESSION: Interval development of hydropneumothorax in the superior portion of left hemithorax status post left upper lobectomy.   Electronically Signed  By: Marijo Conception, M.D.  On: 01/13/2015 14:53  Impression: 69 year old man who is now month out from a thoracoscopic left upper lobectomy for stage IIb squamous cell carcinoma of the lung following new adjuvant chemoradiation.  He is having a great deal of pain. Certainly he is on the high end of the pain scale for a thoracoscopic approach. We did cryo-analgesia, but it does not seem to have helped much in his case. He was already taking narcotics prior to surgery for radiation induced esophagitis. He does have a history of alcoholism so we need to be aware of potential for abuse, but I do not think there is any abuse going on at present.   I gave him a prescription for Norco 7.5/325, 50 tablets, one by mouth every 4 hours as needed, no refills. I suspect he will need pain meds for a couple of months.  He needs to follow-up with Dr. Julien Nordmann and Dr. Sondra Come. We'll arrange those appointments.  I will plan to see him back in a month with a chest x-ray to check on his progress.    Melrose Nakayama, MD [facility] [phone]

## 2015-01-20 ENCOUNTER — Encounter: Payer: Self-pay | Admitting: *Deleted

## 2015-01-20 ENCOUNTER — Other Ambulatory Visit: Payer: Self-pay | Admitting: *Deleted

## 2015-01-20 DIAGNOSIS — C3492 Malignant neoplasm of unspecified part of left bronchus or lung: Secondary | ICD-10-CM

## 2015-01-20 NOTE — Progress Notes (Signed)
Oncology Nurse Navigator Documentation  Oncology Nurse Navigator Flowsheets 01/20/2015  Navigator Encounter Type Other/I spoke with Dr. Roxan Hockey and he stated patient needs a follow up appt with Dr. Julien Nordmann and Dr. Sondra Come.  I spoke with Dr. Julien Nordmann.  He stated patient can be seen in the next 2 weeks for follow up.  I notified scheduling to schedule follow up appt.   I will inbasket Dr. Sondra Come for follow up appt.   Patient Visit Type Follow-up  Treatment Phase Other  Interventions Coordination of Care  Coordination of Care MD Appointments  Time Spent with Patient 15

## 2015-01-21 ENCOUNTER — Telehealth: Payer: Self-pay | Admitting: Internal Medicine

## 2015-01-21 NOTE — Telephone Encounter (Signed)
Called and left a message with a follow up per pof

## 2015-01-22 ENCOUNTER — Encounter: Payer: Self-pay | Admitting: Radiation Oncology

## 2015-01-22 ENCOUNTER — Ambulatory Visit
Admission: RE | Admit: 2015-01-22 | Discharge: 2015-01-22 | Disposition: A | Discharge status: Home / Self Care | Payer: Medicare Other | Source: Ambulatory Visit | Attending: Radiation Oncology | Admitting: Radiation Oncology

## 2015-01-22 VITALS — BP 111/73 | HR 83 | Temp 98.2°F | Resp 18 | Ht 71.0 in | Wt 177.4 lb

## 2015-01-22 DIAGNOSIS — G8918 Other acute postprocedural pain: Secondary | ICD-10-CM

## 2015-01-22 DIAGNOSIS — C3492 Malignant neoplasm of unspecified part of left bronchus or lung: Secondary | ICD-10-CM

## 2015-01-22 MED ORDER — HYDROCODONE-ACETAMINOPHEN 7.5-325 MG PO TABS: 1.0000 | ORAL_TABLET | ORAL | Status: DC | PRN

## 2015-01-22 NOTE — Progress Notes (Signed)
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  Interval Since Last Radiation:   3 months and 3 weeks  Narrative: The patient returns today for routine follow-up appointment with radiation oncology. He denies symptoms of dry mouth, weight loss, or difficulty with chewing and swallowing. He reports symptoms of fatigue, but consistently holds a healthy appetite. "It constipates me," the patient stated about his current medications. In regards to recovery of his most recent surgery, completed on 07/1102016, his pain is rated at a 6/10. The patient reported that,"It's very sensitive when fabric touches it or the seatbelt, in the shower, and sometimes hurts all the way down. Sometimes I get heartburn, but not since I've cut down on mucinex." The nebulizer also causes heart burn. The patient denies difficulty breathing, unless it is humid, in which a productive cough occurs. The patient projected a health mental disposition and was accompanied by his wife. They currently reside in [place], [place]. He had a follow-up appointment with medical oncology last week in August of 2016.                ALLERGIES:  is allergic to prolixin and advair diskus.  Meds: Current Outpatient Prescriptions  Medication Sig Dispense Refill  . amLODipine (NORVASC) 10 MG tablet Take 1 tablet (10 mg total) by mouth daily. 30 tablet 1  . Ascorbic Acid (VITAMIN C) 1000 MG tablet Take 1,000 mg by mouth daily.    Marland Kitchen Bioflavonoid Products (BIOFLEX PO) Take 1 tablet by mouth 2 (two) times daily.     . clonazePAM (KLONOPIN) 1 MG tablet Take 1 mg by mouth 2 (two) times daily.    Marland Kitchen  docusate sodium (COLACE) 100 MG capsule Take 100 mg by mouth 2 (two) times daily.    Marland Kitchen HYDROcodone-acetaminophen (NORCO) 7.5-325 MG per tablet Take 1 tablet by mouth every 4 (four) hours as needed for moderate pain. 50 tablet 0  . ibuprofen (ADVIL,MOTRIN) 200 MG tablet Take 400 mg by mouth 2 (two) times daily.     Marland Kitchen ipratropium-albuterol (DUONEB) 0.5-2.5 (3) MG/3ML SOLN Take 3 mLs by nebulization 4 (four) times daily as needed (Wheezing, shortness of breath).   5  . loratadine (CLARITIN) 10 MG tablet Take 10 mg by mouth every 12 (twelve) hours.    . multivitamin-iron-minerals-folic acid (CENTRUM) chewable tablet Chew 1 tablet by mouth daily.    Marland Kitchen omeprazole (PRILOSEC) 20 MG capsule Take 20 mg by mouth daily.    . Saw Palmetto, Serenoa repens, (SAW PALMETTO PO) Take 2 tablets by mouth 3 (three) times daily.    . ST JOHNS WORT PO Take 1 tablet by mouth 3 (three) times daily.    . SYMBICORT 160-4.5 MCG/ACT inhaler Inhale 2 puffs into the lungs 2 (two) times daily.    . sucralfate (CARAFATE) 1 GM/10ML suspension Take 10 mLs (1 g total) by mouth 4 (four) times daily -  with meals and at bedtime. (Patient not taking: Reported on 01/22/2015) 420 mL 0   No current facility-administered medications for this encounter.    Physical Findings: The patient is in no acute distress and the patient is alert and oriented  x3.  height is '5\' 11"'$  (1.803 m) and weight is 177 lb 6.4 oz (80.468 kg). His oral temperature is 98.2 F (36.8 C). His blood pressure is 111/73 and his pulse is 83. His respiration is 18 and oxygen saturation is 95%.  Lungs are clear upon auscultation (with decreased breath sounds on the left side) c/w with his surgery, the heart has regular rate and rhythm and no palpable cervical, supraclavicular, or axillary adenopathy to be noted. He continues to use supplemental oxygen with no significant changes in the status of his overall health. Scars are healing well with no sign of infection from his most  recent surgery.  Lab Findings: Lab Results  Component Value Date   WBC 5.6 12/20/2014   HGB 9.6* 12/20/2014   HCT 29.3* 12/20/2014   MCV 92.4 12/20/2014   PLT 195 12/20/2014    Radiographic Findings: Dg Chest 2 View  01/13/2015   CLINICAL DATA:  Left-sided chest pain. Malignant neoplasm of left lung, status post left upper lobe lobectomy.  EXAM: CHEST  2 VIEW  COMPARISON:  December 21, 2014.  FINDINGS: Status post left upper lobectomy. Surgical clips are noted in the left hilar region. There is interval development of hydropneumothorax in left upper lobe. Old left rib fractures are noted. Mediastinal shift to the left is noted with hyperexpansion of the right lung. Right lung is otherwise clear. Elevated left hemidiaphragm is noted with probable subsegmental atelectasis.  IMPRESSION: Interval development of hydropneumothorax in the superior portion of left hemithorax status post left upper lobectomy.   Electronically Signed   By: Marijo Conception, M.D.   On: 01/13/2015 14:53    Impression:  The patient is recovering from the effects of radiation. 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 Common symptoms to expect and the recovery process from his most recent surgery was discussed. Appropriate management of current medications were reviewed. The patient had an excellent response to his preoperative radiation and chemotherapy with no viable tumor in lung specimen or nodes.  Plan: Healthy methods of management in regards to his symptoms due to recovery from radiation and his most recent surgery were addressed. The patient is managing his symptoms of pain and heart burn with recommended medications. A refill for the prescribed medication hydrocodone was requested and provided. The patient is aware of his upcoming appointment with Dr. Julien Nordmann and Dr. Roxan Hockey. The patient is advised of his  follow-up appointment with radiation oncology to take place as needed. He is advised to continue his follow up appointments with medical oncology and surgery. All questions and concerns vocalized have been fully addressed. If the patient develops any further questions or concerns, he has been encouraged to contact Dr. Sondra Come, MD, PhD.   This document serves as a record of services personally performed by Gery Pray, MD. It was created on his behalf by Lenn Cal, a trained medical scribe. The creation of this record is based on the scribe's personal observations and the provider's statements to them. This document has been checked and approved by the attending provider.    ____________________________________   Blair Promise, PhD, MD

## 2015-01-22 NOTE — Progress Notes (Addendum)
folow up s/p rad Lung 08/25/14-09/29/14, still  Cough non productive, on mucinex '600mg'$  bid, pain 6/10 scale left rib side burning/awhen touches it , no difficulty chewing or swallowing, does have dry mouth,  Appetite good, enenrgy level not too great, tires easily, sob at times,  5 weeks post op done on 12/15/14 by  Dr. Roxan Hockey, seen last week  BP 111/73 mmHg  Pulse 83  Temp(Src) 98.2 F (36.8 C) (Oral)  Resp 18  Ht '5\' 11"'$  (1.803 m)  Wt 177 lb 6.4 oz (80.468 kg)  BMI 24.75 kg/m2  SpO2 95%  Wt Readings from Last 3 Encounters:  01/22/15 177 lb 6.4 oz (80.468 kg)  01/13/15 181 lb (82.101 kg)  12/11/14 186 lb (84.369 kg)

## 2015-01-22 NOTE — Progress Notes (Signed)
Follow up.

## 2015-01-26 ENCOUNTER — Ambulatory Visit (INDEPENDENT_AMBULATORY_CARE_PROVIDER_SITE_OTHER): Payer: Medicare Other | Admitting: Pulmonary Disease

## 2015-01-26 ENCOUNTER — Encounter: Payer: Self-pay | Admitting: Pulmonary Disease

## 2015-01-26 ENCOUNTER — Telehealth: Payer: Self-pay | Admitting: Pulmonary Disease

## 2015-01-26 VITALS — BP 132/68 | HR 94 | Temp 98.1°F | Ht 71.0 in | Wt 176.0 lb

## 2015-01-26 DIAGNOSIS — C3492 Malignant neoplasm of unspecified part of left bronchus or lung: Secondary | ICD-10-CM

## 2015-01-26 DIAGNOSIS — J449 Chronic obstructive pulmonary disease, unspecified: Secondary | ICD-10-CM

## 2015-01-26 MED ORDER — PREDNISONE 20 MG PO TABS: 10.0000 mg | ORAL_TABLET | Freq: Every day | ORAL | Status: AC

## 2015-01-26 NOTE — Telephone Encounter (Signed)
Called spoke with [place] from [place]. She reports pt has been with [facility]. She called pt and he told her the same he was happy with [facility] and wanted to stay with them. Please advise PCC's thanks

## 2015-01-26 NOTE — Assessment & Plan Note (Signed)
He is wheezing more today and appears to have a mild exacerbation. He has moderate COPD based on his preoperative lung function test, at some point we will need to repeat this.  Plan: Prednisone taper Add spacer  Continue Symbicort  we will prescribe a portable oxygen concentrator  I encouraged regular activity and specific exercises, he may need pulmonary rehabilitation if he is unable to do it on his own Follow-up 3 months

## 2015-01-26 NOTE — Progress Notes (Signed)
Subjective:    Patient ID: Jeffrey Hines, male    DOB: September 12, 1945, 69 y.o.   MRN: [id number]  Synopsis: Jeffrey Hines was diagnosis grams cell carcinoma of the left upper lung in 2016. He was hospitalized for Pseudomonas pneumonia at the same time. He started treatment with chemotherapy and radiation therapy in 2016. He had stage IIB disease but because of the proximity of the lung mass to the orifice of the left mainstem bronchus it was determined that he was not healthy enough to undergo a pneumonectomy at the time. He was also found to have COPD. March 2016 pulmonary function test> ratio 50%, FEV1 L (49% predicted, 18% change with bronchodilator), total lung capacity 6.73 L (93% predicted), DLCO 11.13 (33% predicted). June 2016 pulmonary function testing ratio 46%, FEV1 2.47 L (71% Pred), total lung capacity 7.79 L (107% predicted), DLCO 10.62 (31% predicted). 11/2014 ONO < 88% 2 hours  HPI Chief Complaint  Patient presents with  . Follow-up    pt c/o increased wheezing X1 week, aching pains all over.     He had his left upper lobectomy recently and has been doing OK since then.  He has some wheezing which he perceives as worse.  He says the hot weather has made his breathing worse. He says that his breathing has been OK however overall since surgery.  He is using the oxygen at night primarily.  He has not been as active as he wants since surgery. He is taking symbicort twice ad ay.  He is taking mucinex, a lower dose than when he was in the hospital.  He feels like there is mucus in his chest that he can't cough out.  He occassionally produces green mucus.  Past Medical History  Diagnosis Date  . COPD (chronic obstructive pulmonary disease)   . Closed head injury 1998  . Multiple rib fractures 1998    left side  . Shoulder dislocation 1998    left  . COPD with chronic bronchitis   . Tobacco abuse   . Hypertension   . Spontaneous pneumothorax 08/03/2014    Left side 1st time episode  spontaneous pneumothorax associated with acute flare of COPD   . Hemoptysis 08/03/2014  . Post-obstruction pneumonia due to Pseudomonas aeruginosa 08/07/2014    Endobronchial mass causing LUL obstruction  . Anxiety   . Radiation 08/25/14-09/29/14    left upper central lung 45 Gy  . Cancer   . Seizures   . Shortness of breath dyspnea   . GERD (gastroesophageal reflux disease)   . Chronic lower back pain   . Constipation due to opioid therapy   . Full dentures   . On supplemental oxygen therapy     2L of O2 at night       Review of Systems  Constitutional: Negative for fever, chills and fatigue.  HENT: Negative for postnasal drip, rhinorrhea and sinus pressure.   Respiratory: Negative for cough, shortness of breath and wheezing.   Cardiovascular: Negative for chest pain, palpitations and leg swelling.       Objective:   Physical Exam  Filed Vitals:   01/26/15 1459  BP: 132/68  Pulse: 94  Temp: 98.1 F (36.7 C)  TempSrc: Oral  Height: '5\' 11"'$  (1.803 m)  Weight: 176 lb (79.833 kg)  SpO2: 92%   2L [place]  Gen: well appearing HENT: OP clear, TM's clear, neck supple PULM: CTA B, normal air movement CV: RRR, no mgr, trace edema GI: BS+, soft, nontender Derm:  no cyanosis or rash Psyche: normal mood and affect  08/13/2014 CT PET imaging reviewed> there has been interval improvement in the size of left upper lobe lesion although there is still obstruction of the left upper lobe with some atelectasis, no clear airspace disease, minimal centrilobular emphysema noted Recent radiation oncology notes reviewed, plan for repeat CT this week      Assessment & Plan:   COPD with chronic bronchitis  He is wheezing more today and appears to have a mild exacerbation. He has moderate COPD based on his preoperative lung function test, at some point we will need to repeat this.  Plan: Prednisone taper Add spacer  Continue Symbicort  we will prescribe a portable oxygen concentrator  I  encouraged regular activity and specific exercises, he may need pulmonary rehabilitation if he is unable to do it on his own Follow-up 3 months  Squamous cell carcinoma of lung, stage II  He is now status post a left upper lobectomy. He has received chemotherapy as well as radiation therapy. From my standpoint he is currently doing well.     Current outpatient prescriptions:  .  amLODipine (NORVASC) 10 MG tablet, Take 1 tablet (10 mg total) by mouth daily., Disp: 30 tablet, Rfl: 1 .  Ascorbic Acid (VITAMIN C) 1000 MG tablet, Take 1,000 mg by mouth daily., Disp: , Rfl:  .  Bioflavonoid Products (BIOFLEX PO), Take 1 tablet by mouth 2 (two) times daily. , Disp: , Rfl:  .  clonazePAM (KLONOPIN) 1 MG tablet, Take 1 mg by mouth 2 (two) times daily., Disp: , Rfl:  .  docusate sodium (COLACE) 100 MG capsule, Take 100 mg by mouth 2 (two) times daily., Disp: , Rfl:  .  HYDROcodone-acetaminophen (NORCO) 7.5-325 MG per tablet, Take 1 tablet by mouth every 4 (four) hours as needed for moderate pain., Disp: 50 tablet, Rfl: 0 .  ibuprofen (ADVIL,MOTRIN) 200 MG tablet, Take 400 mg by mouth 2 (two) times daily. , Disp: , Rfl:  .  ipratropium-albuterol (DUONEB) 0.5-2.5 (3) MG/3ML SOLN, Take 3 mLs by nebulization 4 (four) times daily as needed (Wheezing, shortness of breath). , Disp: , Rfl: 5 .  loratadine (CLARITIN) 10 MG tablet, Take 10 mg by mouth every 12 (twelve) hours., Disp: , Rfl:  .  multivitamin-iron-minerals-folic acid (CENTRUM) chewable tablet, Chew 1 tablet by mouth daily., Disp: , Rfl:  .  omeprazole (PRILOSEC) 20 MG capsule, Take 20 mg by mouth daily., Disp: , Rfl:  .  Saw Palmetto, Serenoa repens, (SAW PALMETTO PO), Take 2 tablets by mouth 3 (three) times daily., Disp: , Rfl:  .  ST JOHNS WORT PO, Take 1 tablet by mouth 3 (three) times daily., Disp: , Rfl:  .  sucralfate (CARAFATE) 1 GM/10ML suspension, Take 10 mLs (1 g total) by mouth 4 (four) times daily -  with meals and at bedtime., Disp: 420  mL, Rfl: 0 .  SYMBICORT 160-4.5 MCG/ACT inhaler, Inhale 2 puffs into the lungs 2 (two) times daily., Disp: , Rfl:  .  predniSONE (DELTASONE) 20 MG tablet, Take 0.5 tablets (10 mg total) by mouth daily with breakfast., Disp: 5 tablet, Rfl: 0

## 2015-01-26 NOTE — Patient Instructions (Signed)
Take the prednisone as prescribed Use the spacer with your Symbicort  use the flutter valve 4-5 breaths, 4-5 times a day Exercise regularly, at least 15-20 minutes of walking plus the resistance training I recommended If you're not exercising well then let us know and we will prescribe pulmonary rehabilitation  we will see you back in 3 months or sooner if needed

## 2015-01-26 NOTE — Assessment & Plan Note (Signed)
He is now status post a left upper lobectomy. He has received chemotherapy as well as radiation therapy. From my standpoint he is currently doing well.

## 2015-01-27 NOTE — Telephone Encounter (Signed)
Resent order to [facility]

## 2015-02-03 ENCOUNTER — Telehealth: Payer: Self-pay | Admitting: Internal Medicine

## 2015-02-03 ENCOUNTER — Ambulatory Visit (HOSPITAL_BASED_OUTPATIENT_CLINIC_OR_DEPARTMENT_OTHER): Payer: Medicare Other

## 2015-02-03 ENCOUNTER — Encounter: Payer: Self-pay | Admitting: Physician Assistant

## 2015-02-03 ENCOUNTER — Ambulatory Visit (HOSPITAL_BASED_OUTPATIENT_CLINIC_OR_DEPARTMENT_OTHER): Payer: Medicare Other | Admitting: Physician Assistant

## 2015-02-03 VITALS — BP 117/73 | HR 102 | Temp 97.0°F | Resp 18 | Ht 71.0 in | Wt 175.1 lb

## 2015-02-03 DIAGNOSIS — C3492 Malignant neoplasm of unspecified part of left bronchus or lung: Secondary | ICD-10-CM

## 2015-02-03 DIAGNOSIS — G8918 Other acute postprocedural pain: Secondary | ICD-10-CM

## 2015-02-03 DIAGNOSIS — C3412 Malignant neoplasm of upper lobe, left bronchus or lung: Secondary | ICD-10-CM | POA: Diagnosis not present

## 2015-02-03 LAB — COMPREHENSIVE METABOLIC PANEL (CC13)
ALK PHOS: 77 U/L (ref 40–150)
ALT: 19 U/L (ref 0–55)
AST: 15 U/L (ref 5–34)
Albumin: 3.6 g/dL (ref 3.5–5.0)
Anion Gap: 8 mEq/L (ref 3–11)
BILIRUBIN TOTAL: 0.21 mg/dL (ref 0.20–1.20)
BUN: 22 mg/dL (ref 7.0–26.0)
CO2: 29 meq/L (ref 22–29)
CREATININE: 0.8 mg/dL (ref 0.7–1.3)
Calcium: 9.3 mg/dL (ref 8.4–10.4)
Chloride: 107 mEq/L (ref 98–109)
Glucose: 101 mg/dl (ref 70–140)
Potassium: 4.9 mEq/L (ref 3.5–5.1)
Sodium: 144 mEq/L (ref 136–145)
TOTAL PROTEIN: 7.2 g/dL (ref 6.4–8.3)

## 2015-02-03 LAB — CBC WITH DIFFERENTIAL/PLATELET
BASO%: 0.1 % (ref 0.0–2.0)
Basophils Absolute: 0 10*3/uL (ref 0.0–0.1)
EOS ABS: 0.3 10*3/uL (ref 0.0–0.5)
EOS%: 2.8 % (ref 0.0–7.0)
HCT: 37.2 % — ABNORMAL LOW (ref 38.4–49.9)
HEMOGLOBIN: 12 g/dL — AB (ref 13.0–17.1)
LYMPH#: 0.8 10*3/uL — AB (ref 0.9–3.3)
LYMPH%: 7.5 % — AB (ref 14.0–49.0)
MCH: 28.1 pg (ref 27.2–33.4)
MCHC: 32.3 g/dL (ref 32.0–36.0)
MCV: 87.1 fL (ref 79.3–98.0)
MONO#: 0.9 10*3/uL (ref 0.1–0.9)
MONO%: 8.5 % (ref 0.0–14.0)
NEUT%: 81.1 % — ABNORMAL HIGH (ref 39.0–75.0)
NEUTROS ABS: 8.1 10*3/uL — AB (ref 1.5–6.5)
PLATELETS: 327 10*3/uL (ref 140–400)
RBC: 4.27 10*6/uL (ref 4.20–5.82)
RDW: 13.6 % (ref 11.0–14.6)
WBC: 10 10*3/uL (ref 4.0–10.3)

## 2015-02-03 MED ORDER — HYDROCODONE-ACETAMINOPHEN 7.5-325 MG PO TABS
1.0000 | ORAL_TABLET | Freq: Four times a day (QID) | ORAL | Status: DC | PRN
Start: 2015-02-03 — End: 2015-04-28
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
## 2015-02-03 NOTE — Telephone Encounter (Signed)
Appointments made and avs printed for patient °

## 2015-02-06 NOTE — Patient Instructions (Signed)
Follow up in 3 months with a restaging CT scan of your chest to re-evaluate your disease

## 2015-02-16 ENCOUNTER — Other Ambulatory Visit: Payer: Self-pay | Admitting: Thoracic Surgery (Cardiothoracic Vascular Surgery)

## 2015-02-16 DIAGNOSIS — C3490 Malignant neoplasm of unspecified part of unspecified bronchus or lung: Secondary | ICD-10-CM

## 2015-02-17 ENCOUNTER — Ambulatory Visit
Admission: RE | Admit: 2015-02-17 | Discharge: 2015-02-17 | Disposition: A | Discharge status: Home / Self Care | Payer: Medicare Other | Source: Ambulatory Visit | Attending: Thoracic Surgery (Cardiothoracic Vascular Surgery) | Admitting: Thoracic Surgery (Cardiothoracic Vascular Surgery)

## 2015-02-17 ENCOUNTER — Ambulatory Visit (INDEPENDENT_AMBULATORY_CARE_PROVIDER_SITE_OTHER): Payer: Self-pay | Admitting: Thoracic Surgery (Cardiothoracic Vascular Surgery)

## 2015-02-17 ENCOUNTER — Encounter: Payer: Self-pay | Admitting: Thoracic Surgery (Cardiothoracic Vascular Surgery)

## 2015-02-17 VITALS — BP 115/75 | HR 80 | Resp 18 | Ht 71.0 in | Wt 175.0 lb

## 2015-02-17 DIAGNOSIS — C3490 Malignant neoplasm of unspecified part of unspecified bronchus or lung: Secondary | ICD-10-CM | POA: Diagnosis not present

## 2015-02-17 DIAGNOSIS — Z9889 Other specified postprocedural states: Secondary | ICD-10-CM

## 2015-02-17 DIAGNOSIS — Z902 Acquired absence of lung [part of]: Secondary | ICD-10-CM

## 2015-02-17 DIAGNOSIS — C3492 Malignant neoplasm of unspecified part of left bronchus or lung: Secondary | ICD-10-CM

## 2015-02-17 NOTE — Progress Notes (Signed)
[address]       [place] [address]             [phone]       HPI:  Jeffrey Hines returns for a scheduled follow-up visit  He is a 69 year old man who was diagnosed with a stage IIb squamous cell carcinoma of the lung earlier this year. He was treated with neoadjuvant chemoradiation (45 Gy), then underwent thoracoscopic left upper lobectomy on 12/15/2014. Pathology showed no residual viable tumor. He was discharged on postoperative day #6.  He was last seen in the office on 01/13/2015. At that time he was having a great deal of difficulty with incisional pain. He was using hydrocodone about every 4 hours.  Since his last visit he saw Dr. Sondra Come and Dr. Julien Nordmann. They were both happy with his progress. He continues to have incisional pain. He says he is taking hydrocodone 3-4 times a day. His incisional pain has gotten better but has not completely resolved. He says he has good days and bad days. Some days he does not have any energy and just wants to sleep all day.  Past Medical History  Diagnosis Date  . COPD (chronic obstructive pulmonary disease)   . Closed head injury 1998  . Multiple rib fractures 1998    left side  . Shoulder dislocation 1998    left  . COPD with chronic bronchitis   . Tobacco abuse   . Hypertension   . Spontaneous pneumothorax 08/03/2014    Left side 1st time episode spontaneous pneumothorax associated with acute flare of COPD   . Hemoptysis 08/03/2014  . Post-obstruction pneumonia due to Pseudomonas aeruginosa 08/07/2014    Endobronchial mass causing LUL obstruction  . Anxiety   . Radiation 08/25/14-09/29/14    left upper central lung 45 Gy  . Cancer   . Seizures   . Shortness of breath dyspnea   . GERD (gastroesophageal reflux disease)   . Chronic lower back pain   . Constipation due to opioid therapy   . Full dentures   . On supplemental oxygen therapy     2L of O2 at night     Current Outpatient Prescriptions    Medication Sig Dispense Refill  . amLODipine (NORVASC) 10 MG tablet Take 1 tablet (10 mg total) by mouth daily. 30 tablet 1  . Ascorbic Acid (VITAMIN C) 1000 MG tablet Take 1,000 mg by mouth daily.    Marland Kitchen Bioflavonoid Products (BIOFLEX PO) Take 1 tablet by mouth 2 (two) times daily.     . clonazePAM (KLONOPIN) 1 MG tablet Take 1 mg by mouth 2 (two) times daily.    Marland Kitchen docusate sodium (COLACE) 100 MG capsule Take 100 mg by mouth 2 (two) times daily.    Marland Kitchen HYDROcodone-acetaminophen (NORCO) 7.5-325 MG per tablet Take 1 tablet by mouth every 6 (six) hours as needed for moderate pain. 50 tablet 0  . ibuprofen (ADVIL,MOTRIN) 200 MG tablet Take 400 mg by mouth 2 (two) times daily.     Marland Kitchen ipratropium-albuterol (DUONEB) 0.5-2.5 (3) MG/3ML SOLN Take 3 mLs by nebulization 4 (four) times daily as needed (Wheezing, shortness of breath).   5  . loratadine (CLARITIN) 10 MG tablet Take 10 mg by mouth every 12 (twelve) hours.    . multivitamin-iron-minerals-folic acid (CENTRUM) chewable tablet Chew 1 tablet by mouth daily.    Marland Kitchen omeprazole (PRILOSEC) 20 MG capsule Take 20 mg by mouth daily.    Clarnce Flock Palmetto,  Serenoa repens, (SAW PALMETTO PO) Take 2 tablets by mouth 3 (three) times daily.    . ST JOHNS WORT PO Take 1 tablet by mouth 3 (three) times daily.    . sucralfate (CARAFATE) 1 GM/10ML suspension Take 10 mLs (1 g total) by mouth 4 (four) times daily -  with meals and at bedtime. 420 mL 0  . SYMBICORT 160-4.5 MCG/ACT inhaler Inhale 2 puffs into the lungs 2 (two) times daily.     No current facility-administered medications for this visit.    Physical Exam BP 115/75 mmHg  Pulse 80  Resp 18  Ht '5\' 11"'$  (1.803 m)  Wt 175 lb (79.379 kg)  BMI 24.42 kg/m2  SpO32 87% 69 year old man in no acute distress Alert and oriented 3 with no focal neurologic deficit No cervical or suprapubic or adenopathy Incisions well healed Diminished breath sounds at left apex  Diagnostic Tests: I personally reviewed his chest  x-ray. It did shows evolution of postoperative changes with the apical space filling with fluid.  Impression: 69 year old man with a stage IIb squamous cell carcinoma of the lung treated with the wedge or radiation followed by thoracoscopic left upper lobectomy. He is now 2 months out from surgery and is still having some issues with pain and general malaise. This is not surprising given all he has been through over the past several months. I encouraged him that he should continue to improve for the next several months as well.  I did encourage him to start trying to taper off the hydrocodone. That may take some time.  Dr. Julien Nordmann has scheduled him for a CT scan in November.  Plan: I'll plan to see him back in 6 months with a PA and lateral chest x-ray.  Melrose Nakayama, MD [facility] [phone]

## 2015-04-24 ENCOUNTER — Other Ambulatory Visit (HOSPITAL_BASED_OUTPATIENT_CLINIC_OR_DEPARTMENT_OTHER): Payer: Medicare Other

## 2015-04-24 ENCOUNTER — Encounter (HOSPITAL_COMMUNITY): Payer: Self-pay

## 2015-04-24 ENCOUNTER — Telehealth: Payer: Self-pay | Admitting: *Deleted

## 2015-04-24 ENCOUNTER — Ambulatory Visit (HOSPITAL_COMMUNITY)
Admission: RE | Admit: 2015-04-24 | Discharge: 2015-04-24 | Disposition: A | Discharge status: Home / Self Care | Payer: Medicare Other | Source: Ambulatory Visit | Attending: Physician Assistant | Admitting: Physician Assistant

## 2015-04-24 DIAGNOSIS — R918 Other nonspecific abnormal finding of lung field: Secondary | ICD-10-CM | POA: Insufficient documentation

## 2015-04-24 DIAGNOSIS — Z902 Acquired absence of lung [part of]: Secondary | ICD-10-CM | POA: Diagnosis not present

## 2015-04-24 DIAGNOSIS — C3492 Malignant neoplasm of unspecified part of left bronchus or lung: Secondary | ICD-10-CM

## 2015-04-24 DIAGNOSIS — R0602 Shortness of breath: Secondary | ICD-10-CM | POA: Diagnosis not present

## 2015-04-24 LAB — COMPREHENSIVE METABOLIC PANEL (CC13)
ALBUMIN: 3.8 g/dL (ref 3.5–5.0)
ALT: 15 U/L (ref 0–55)
AST: 18 U/L (ref 5–34)
Alkaline Phosphatase: 85 U/L (ref 40–150)
Anion Gap: 8 mEq/L (ref 3–11)
BUN: 15.2 mg/dL (ref 7.0–26.0)
CALCIUM: 9.2 mg/dL (ref 8.4–10.4)
CO2: 29 mEq/L (ref 22–29)
Chloride: 107 mEq/L (ref 98–109)
Creatinine: 0.8 mg/dL (ref 0.7–1.3)
EGFR: >90 mL/min/{1.73_m2} (ref >90)
GLUCOSE: 94 mg/dL (ref 70–140)
POTASSIUM: 4.1 meq/L (ref 3.5–5.1)
SODIUM: 144 meq/L (ref 136–145)
TOTAL PROTEIN: 7.2 g/dL (ref 6.4–8.3)
Total Bilirubin: 0.3 mg/dL (ref 0.20–1.20)

## 2015-04-24 LAB — CBC WITH DIFFERENTIAL/PLATELET
BASO%: 0.6 % (ref 0.0–2.0)
Basophils Absolute: 0 10*3/uL (ref 0.0–0.1)
EOS%: 3.7 % (ref 0.0–7.0)
Eosinophils Absolute: 0.2 10*3/uL (ref 0.0–0.5)
HEMATOCRIT: 38.9 % (ref 38.4–49.9)
HEMOGLOBIN: 13 g/dL (ref 13.0–17.1)
LYMPH#: 0.7 10*3/uL — AB (ref 0.9–3.3)
LYMPH%: 12.7 % — ABNORMAL LOW (ref 14.0–49.0)
MCH: 27.9 pg (ref 27.2–33.4)
MCHC: 33.3 g/dL (ref 32.0–36.0)
MCV: 83.8 fL (ref 79.3–98.0)
MONO#: 0.6 10*3/uL (ref 0.1–0.9)
MONO%: 10.8 % (ref 0.0–14.0)
NEUT%: 72.2 % (ref 39.0–75.0)
NEUTROS ABS: 3.7 10*3/uL (ref 1.5–6.5)
Platelets: 234 10*3/uL (ref 140–400)
RBC: 4.64 10*6/uL (ref 4.20–5.82)
RDW: 15.9 % — AB (ref 11.0–14.6)
WBC: 5.2 10*3/uL (ref 4.0–10.3)

## 2015-04-24 MED ORDER — IOHEXOL 300 MG/ML  SOLN
75.0000 mL | Freq: Once | INTRAMUSCULAR | Status: AC | PRN
  Administered 2015-04-24: 75 mL via INTRAVENOUS

## 2015-04-24 NOTE — Telephone Encounter (Signed)
Received call from [place] @ [facility] wanting to call report of CT Chest done today.  Results printed and showed to Dr. Alvy Bimler to review.  No orders received, and ok to wait for Dr. Julien Nordmann to review on  Monday 04/27/15.

## 2015-04-28 ENCOUNTER — Telehealth: Payer: Self-pay | Admitting: Internal Medicine

## 2015-04-28 ENCOUNTER — Ambulatory Visit (HOSPITAL_BASED_OUTPATIENT_CLINIC_OR_DEPARTMENT_OTHER): Payer: Medicare Other | Admitting: Internal Medicine

## 2015-04-28 ENCOUNTER — Encounter: Payer: Self-pay | Admitting: Internal Medicine

## 2015-04-28 ENCOUNTER — Encounter: Payer: Self-pay | Admitting: Pulmonary Disease

## 2015-04-28 ENCOUNTER — Ambulatory Visit (INDEPENDENT_AMBULATORY_CARE_PROVIDER_SITE_OTHER): Payer: Medicare Other | Admitting: Pulmonary Disease

## 2015-04-28 VITALS — BP 116/64 | HR 97 | Ht 71.0 in | Wt 183.0 lb

## 2015-04-28 VITALS — BP 105/75 | HR 96 | Temp 97.7°F | Resp 18 | Ht 71.0 in | Wt 180.5 lb

## 2015-04-28 DIAGNOSIS — C3492 Malignant neoplasm of unspecified part of left bronchus or lung: Secondary | ICD-10-CM

## 2015-04-28 DIAGNOSIS — J449 Chronic obstructive pulmonary disease, unspecified: Secondary | ICD-10-CM | POA: Diagnosis not present

## 2015-04-28 DIAGNOSIS — J9611 Chronic respiratory failure with hypoxia: Secondary | ICD-10-CM | POA: Diagnosis not present

## 2015-04-28 NOTE — Patient Instructions (Signed)
Keep using your oxygen at night Stay active Keep taking your Symbicort as you're doing We will see you back in 6 months or sooner if needed

## 2015-04-28 NOTE — Assessment & Plan Note (Signed)
This has been a stable interval for him without an exacerbation of his COPD. He remains quite active His flu shot is up-to-date  Plan: Continue Symbicort Remain active Follow-up 6 months

## 2015-04-28 NOTE — Telephone Encounter (Signed)
Gave and pritnd appt sched and avs for pt for Feb 2017

## 2015-04-28 NOTE — Assessment & Plan Note (Signed)
I have personally reviewed the images from his CT chest from November 2016. There is finding of mucous in the left lower lobe as well as some nondescript right lower lobe groundglass. My clinical impression of the mucus in the left lower lobe is at this is most likely related to his chronic bronchitis.  However, if oncology would like to have this inspected further I would be happy to perform a bronchoscopy if necessary.
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
## 2015-04-28 NOTE — Assessment & Plan Note (Signed)
He ambulated 500 feet today in the office on room air and his O2 saturation remained above 94%. However, in June 2016 he had an overnight oximetry test on room air which showed significant desaturation less than 88% for greater than 2 hours.  Plan: Continue 2 L at sleep  2 L oxygen daily at bedtime

## 2015-05-19 DIAGNOSIS — J441 Chronic obstructive pulmonary disease with (acute) exacerbation: Secondary | ICD-10-CM | POA: Diagnosis not present

## 2015-05-22 NOTE — Telephone Encounter (Signed)
Errant encounter

## 2015-07-21 ENCOUNTER — Encounter: Payer: Self-pay | Admitting: Pulmonary Disease

## 2015-07-27 ENCOUNTER — Encounter (HOSPITAL_COMMUNITY): Payer: Self-pay

## 2015-07-27 ENCOUNTER — Other Ambulatory Visit (HOSPITAL_BASED_OUTPATIENT_CLINIC_OR_DEPARTMENT_OTHER): Payer: Medicare Other

## 2015-07-27 ENCOUNTER — Ambulatory Visit (HOSPITAL_COMMUNITY)
Admission: RE | Admit: 2015-07-27 | Discharge: 2015-07-27 | Disposition: A | Discharge status: Home / Self Care | Payer: Medicare Other | Source: Ambulatory Visit | Attending: Internal Medicine | Admitting: Internal Medicine

## 2015-07-27 DIAGNOSIS — Z85118 Personal history of other malignant neoplasm of bronchus and lung: Secondary | ICD-10-CM | POA: Insufficient documentation

## 2015-07-27 DIAGNOSIS — Z08 Encounter for follow-up examination after completed treatment for malignant neoplasm: Secondary | ICD-10-CM | POA: Diagnosis not present

## 2015-07-27 DIAGNOSIS — Z9889 Other specified postprocedural states: Secondary | ICD-10-CM | POA: Insufficient documentation

## 2015-07-27 DIAGNOSIS — J439 Emphysema, unspecified: Secondary | ICD-10-CM | POA: Insufficient documentation

## 2015-07-27 DIAGNOSIS — C3492 Malignant neoplasm of unspecified part of left bronchus or lung: Secondary | ICD-10-CM | POA: Diagnosis present

## 2015-07-27 DIAGNOSIS — I2510 Atherosclerotic heart disease of native coronary artery without angina pectoris: Secondary | ICD-10-CM | POA: Insufficient documentation

## 2015-07-27 DIAGNOSIS — I313 Pericardial effusion (noninflammatory): Secondary | ICD-10-CM | POA: Insufficient documentation

## 2015-07-27 LAB — CBC WITH DIFFERENTIAL/PLATELET
BASO%: 0.2 % (ref 0.0–2.0)
Basophils Absolute: 0 10*3/uL (ref 0.0–0.1)
EOS%: 2.9 % (ref 0.0–7.0)
Eosinophils Absolute: 0.2 10*3/uL (ref 0.0–0.5)
HCT: 42.3 % (ref 38.4–49.9)
HEMOGLOBIN: 13.8 g/dL (ref 13.0–17.1)
LYMPH%: 10.2 % — AB (ref 14.0–49.0)
MCH: 28.1 pg (ref 27.2–33.4)
MCHC: 32.6 g/dL (ref 32.0–36.0)
MCV: 86.2 fL (ref 79.3–98.0)
MONO#: 1 10*3/uL — ABNORMAL HIGH (ref 0.1–0.9)
MONO%: 12.2 % (ref 0.0–14.0)
NEUT%: 74.5 % (ref 39.0–75.0)
NEUTROS ABS: 5.9 10*3/uL (ref 1.5–6.5)
Platelets: 219 10*3/uL (ref 140–400)
RBC: 4.91 10*6/uL (ref 4.20–5.82)
RDW: 13.9 % (ref 11.0–14.6)
WBC: 7.9 10*3/uL (ref 4.0–10.3)
lymph#: 0.8 10*3/uL — ABNORMAL LOW (ref 0.9–3.3)

## 2015-07-27 LAB — COMPREHENSIVE METABOLIC PANEL
ALT: 16 U/L (ref 0–55)
AST: 18 U/L (ref 5–34)
Albumin: 3.7 g/dL (ref 3.5–5.0)
Alkaline Phosphatase: 96 U/L (ref 40–150)
Anion Gap: 7 mEq/L (ref 3–11)
BILIRUBIN TOTAL: 0.49 mg/dL (ref 0.20–1.20)
BUN: 16.8 mg/dL (ref 7.0–26.0)
CO2: 29 meq/L (ref 22–29)
Calcium: 9.1 mg/dL (ref 8.4–10.4)
Chloride: 106 mEq/L (ref 98–109)
Creatinine: 0.9 mg/dL (ref 0.7–1.3)
EGFR: 86 mL/min/{1.73_m2} — AB (ref >90)
GLUCOSE: 122 mg/dL (ref 70–140)
Potassium: 4.7 mEq/L (ref 3.5–5.1)
SODIUM: 142 meq/L (ref 136–145)
TOTAL PROTEIN: 7.6 g/dL (ref 6.4–8.3)

## 2015-07-27 MED ORDER — IOHEXOL 300 MG/ML  SOLN
75.0000 mL | Freq: Once | INTRAMUSCULAR | Status: AC | PRN
  Administered 2015-07-27: 75 mL via INTRAVENOUS

## 2015-08-03 ENCOUNTER — Telehealth: Payer: Self-pay | Admitting: Internal Medicine

## 2015-08-03 ENCOUNTER — Ambulatory Visit (HOSPITAL_BASED_OUTPATIENT_CLINIC_OR_DEPARTMENT_OTHER): Payer: Medicare Other | Admitting: Internal Medicine

## 2015-08-03 ENCOUNTER — Encounter: Payer: Self-pay | Admitting: Internal Medicine

## 2015-08-03 VITALS — BP 110/75 | HR 103 | Temp 97.9°F | Resp 18 | Ht 71.0 in | Wt 178.9 lb

## 2015-08-03 DIAGNOSIS — J449 Chronic obstructive pulmonary disease, unspecified: Secondary | ICD-10-CM | POA: Diagnosis not present

## 2015-08-03 DIAGNOSIS — C3432 Malignant neoplasm of lower lobe, left bronchus or lung: Secondary | ICD-10-CM

## 2015-08-03 DIAGNOSIS — C3492 Malignant neoplasm of unspecified part of left bronchus or lung: Secondary | ICD-10-CM

## 2015-08-03 NOTE — Telephone Encounter (Signed)
Scheduled patient appt per pof, avs report printed.  °
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
## 2015-08-17 ENCOUNTER — Other Ambulatory Visit: Payer: Self-pay | Admitting: Thoracic Surgery (Cardiothoracic Vascular Surgery)

## 2015-08-17 DIAGNOSIS — C3490 Malignant neoplasm of unspecified part of unspecified bronchus or lung: Secondary | ICD-10-CM

## 2015-08-18 ENCOUNTER — Ambulatory Visit (INDEPENDENT_AMBULATORY_CARE_PROVIDER_SITE_OTHER): Payer: Medicare Other | Admitting: Thoracic Surgery (Cardiothoracic Vascular Surgery)

## 2015-08-18 ENCOUNTER — Ambulatory Visit
Admission: RE | Admit: 2015-08-18 | Discharge: 2015-08-18 | Disposition: A | Discharge status: Home / Self Care | Payer: Medicare Other | Source: Ambulatory Visit | Attending: Thoracic Surgery (Cardiothoracic Vascular Surgery) | Admitting: Thoracic Surgery (Cardiothoracic Vascular Surgery)

## 2015-08-18 ENCOUNTER — Encounter: Payer: Self-pay | Admitting: Thoracic Surgery (Cardiothoracic Vascular Surgery)

## 2015-08-18 VITALS — BP 130/77 | HR 20 | Resp 20 | Ht 71.0 in | Wt 178.0 lb

## 2015-08-18 DIAGNOSIS — C3492 Malignant neoplasm of unspecified part of left bronchus or lung: Secondary | ICD-10-CM | POA: Diagnosis not present

## 2015-08-18 DIAGNOSIS — C3490 Malignant neoplasm of unspecified part of unspecified bronchus or lung: Secondary | ICD-10-CM

## 2015-08-18 DIAGNOSIS — Z902 Acquired absence of lung [part of]: Secondary | ICD-10-CM

## 2015-08-18 DIAGNOSIS — Z85118 Personal history of other malignant neoplasm of bronchus and lung: Secondary | ICD-10-CM | POA: Diagnosis not present

## 2015-08-18 NOTE — Progress Notes (Signed)
Jeffrey Hines       [place],Jeffrey Hines             [phone]       HPI: Jeffrey Hines returns for a 6 month follow-up visit.  Jeffrey Hines is a 70 year old man who was diagnosed with stage IIB non-small cell carcinoma in 2016. Jeffrey Hines underwent neoadjuvant chemotherapy and radiation followed by a thoracoscopic left upper lobectomy on 12/15/2014. Pathology postop showed no residual viable tumor. I last saw Jeffrey Hines in the office in August 2016 at that time Jeffrey Hines was doing well.  Jeffrey Hines does have some occasional wheezing and and gets short of breath with exertion. His breathing does vary with the weather. Jeffrey Hines is not smoking. His appetite is good and his weight is stable. Jeffrey Hines has an occasional cough, denies hemoptysis. Jeffrey Hines has not had any unusual headaches or visual changes. Jeffrey Hines has some residual pain in the left chest that Jeffrey Hines is able to control with a heating pad.  Past Medical History  Diagnosis Date  . COPD (chronic obstructive pulmonary disease) ([place])   . Closed head injury 1998  . Multiple rib fractures 1998    left side  . Shoulder dislocation 1998    left  . COPD with chronic bronchitis ([place])   . Tobacco abuse   . Hypertension   . Spontaneous pneumothorax 08/03/2014    Left side Jeffrey Hines time episode spontaneous pneumothorax associated with acute flare of COPD   . Hemoptysis 08/03/2014  . Post-obstruction pneumonia due to Pseudomonas aeruginosa 08/07/2014    Endobronchial mass causing LUL obstruction  . Anxiety   . Radiation 08/25/14-09/29/14    left upper central lung 45 Gy  . Cancer ([place])   . Seizures ([place])   . Shortness of breath dyspnea   . GERD (gastroesophageal reflux disease)   . Chronic lower back pain   . Constipation due to opioid therapy   . Full dentures   . On supplemental oxygen therapy     2L of O2 at night     Current Outpatient Prescriptions  Medication Sig Dispense Refill  . amLODipine (NORVASC) 10 MG tablet Take 1 tablet (10 mg total) by mouth daily. 30 tablet 1  .  Ascorbic Acid (VITAMIN C) 1000 MG tablet Take 1,000 mg by mouth daily.    Marland Kitchen Bioflavonoid Products (BIOFLEX PO) Take 1 tablet by mouth 2 (two) times daily.     . clonazePAM (KLONOPIN) 1 MG tablet Take 1 mg by mouth 2 (two) times daily.    Marland Kitchen docusate sodium (COLACE) 100 MG capsule Take 100 mg by mouth 2 (two) times daily.    Marland Kitchen ibuprofen (ADVIL,MOTRIN) 200 MG tablet Take 400 mg by mouth 2 (two) times daily. Reported on 08/03/2015    . ipratropium-albuterol (DUONEB) 0.5-2.5 (3) MG/3ML SOLN Take 3 mLs by nebulization 4 (four) times daily as needed (Wheezing, shortness of breath). Reported on 08/03/2015  5  . loratadine (CLARITIN) 10 MG tablet Take 10 mg by mouth every 12 (twelve) hours.    . multivitamin-iron-minerals-folic acid (CENTRUM) chewable tablet Chew 1 tablet by mouth daily.    Marland Kitchen omeprazole (PRILOSEC) 20 MG capsule Take 20 mg by mouth daily. Reported on 08/03/2015    . PROVENTIL HFA 108 (90 BASE) MCG/ACT inhaler     . Saw Palmetto, Serenoa repens, (SAW PALMETTO PO) Take 2 tablets by mouth 3 (three) times daily.    . ST JOHNS WORT PO Take 1 tablet by mouth 3 (three)  times daily.    . SYMBICORT 160-4.5 MCG/ACT inhaler Inhale 2 puffs into the lungs 2 (two) times daily.     No current facility-administered medications for this visit.    Physical Exam BP 130/77 mmHg  Pulse 20  Resp 20  Ht '5\' 11"'$  (1.803 m)  Wt 178 lb (80.74 kg)  BMI 24.84 kg/m2  SpO47 35% 70 year old man in no acute distress Alert and oriented 3 with no focal deficits Incisions well healed Diminished breath sounds on left with faint wheezing, clear on right  Diagnostic Tests: I personally reviewed her chest x-ray from today and the CT scan from 2 weeks ago. Jeffrey Hines has postoperative changes with no evidence of recurrent disease.  Impression: 70 year old man who is now 6 months out from a lobectomy following neoadjuvant chemoradiation. Jeffrey Hines is doing well with no evidence recurrent disease.   Jeffrey Hines is not having much pain. Jeffrey Hines does  have some respiratory compromise which is not surprising. Jeffrey Hines has moderate limitations due to that. Jeffrey Hines sees Dr. Lake Bells about his COPD in May.  Smoking cessation instruction/counseling given:  commended patient for quitting and reviewed strategies for preventing relapses   Plan: I will see Jeffrey Hines back in 6 months after Jeffrey Hines has a CT scan with Dr. Julien Nordmann.  I spent 10 minutes face-to-face with Mr. Sleight during this visit, greater than 50% spent in counseling. Melrose Nakayama, MD [facility] [phone]

## 2015-10-30 ENCOUNTER — Ambulatory Visit (INDEPENDENT_AMBULATORY_CARE_PROVIDER_SITE_OTHER): Payer: Medicare Other | Admitting: Pulmonary Disease

## 2015-10-30 ENCOUNTER — Encounter: Payer: Self-pay | Admitting: Pulmonary Disease

## 2015-10-30 ENCOUNTER — Ambulatory Visit (INDEPENDENT_AMBULATORY_CARE_PROVIDER_SITE_OTHER)
Admission: RE | Admit: 2015-10-30 | Discharge: 2015-10-30 | Disposition: A | Discharge status: Home / Self Care | Payer: Medicare Other | Source: Ambulatory Visit | Attending: Pulmonary Disease | Admitting: Pulmonary Disease

## 2015-10-30 VITALS — BP 126/72 | HR 91 | Ht 71.0 in | Wt 178.0 lb

## 2015-10-30 DIAGNOSIS — J449 Chronic obstructive pulmonary disease, unspecified: Secondary | ICD-10-CM | POA: Diagnosis not present

## 2015-10-30 DIAGNOSIS — C3492 Malignant neoplasm of unspecified part of left bronchus or lung: Secondary | ICD-10-CM | POA: Diagnosis not present

## 2015-10-30 DIAGNOSIS — J9811 Atelectasis: Secondary | ICD-10-CM | POA: Diagnosis not present

## 2015-10-30 MED ORDER — ALBUTEROL SULFATE (2.5 MG/3ML) 0.083% IN NEBU: 2.5000 mg | INHALATION_SOLUTION | Freq: Four times a day (QID) | RESPIRATORY_TRACT | Status: DC | PRN

## 2015-10-30 NOTE — Assessment & Plan Note (Signed)
He had what sounded like an exacerbation of COPD a week ago. The chest x-ray he had at the [place] noted that there is a left upper lobe opacity. It's unclear to me if this is just related to his prior left upper lobectomy or if this represents a new abnormality.  Plan: Chest x-ray two-view today to compare to prior studies to ensure there is no evidence of recurrent malignancy.

## 2015-10-30 NOTE — Progress Notes (Signed)
Subjective:    Patient ID: Jeffrey Hines, male    DOB: [birth year], 70 y.o.   MRN: [id number]  Synopsis: Jeffrey Hines was diagnosed with squamous cell carcinoma of the left upper lung in 2016. He was hospitalized for Pseudomonas pneumonia at the same time. He started treatment with chemotherapy and radiation therapy in 2016. He also has COPD.  After chemo/radiation treatment of his left upper lobe malignancy he had a left upper lobectomy in the summer 2016. March 2016 pulmonary function test> ratio 50%, FEV1 L (49% predicted, 18% change with bronchodilator), total lung capacity 6.73 L (93% predicted), DLCO 11.13 (33% predicted). June 2016 pulmonary function testing ratio 46%, FEV1 2.47 L (71% Pred), total lung capacity 7.79 L (107% predicted), DLCO 10.62 (31% predicted). 11/2014 ONO < 88% 2 hours  HPI Chief Complaint  Patient presents with  . Follow-up    pt c/o intermittent wheezing, sob with exertion.  Pt was given abx  through [place] last week for bronchitis, states this has helped some with s/s.     Jeffrey Hines says he haad bronchitis a week ago and he felt really bad. He was coughig up green mucus at the time.  He says that he had ben on his lawnmower and he was getting exposed to a lot of exhaust from the gasoline engine.  He has been wearing a mask which helps remove it. He says that he felt achy all over after running th lawn mower wit a slight fever, headache.  He was given two antibiotics which helped.   He ad been stable prior to this.  He had not really felt severely limited by breathing difficulty.  Sometimes carrying heavy objects will make him feel more short of breath.  Prior to last week he had not had a problem with cough.   He doesn't use the duoneb too frequently, it helps his breathing but makes his legs cramp.   Past Medical History  Diagnosis Date  . COPD (chronic obstructive pulmonary disease) ([place])   . Closed head injury 1998  . Multiple rib fractures 1998    left side  .  Shoulder dislocation 1998    left  . COPD with chronic bronchitis ([place])   . Tobacco abuse   . Hypertension   . Spontaneous pneumothorax 08/03/2014    Left side 1st time episode spontaneous pneumothorax associated with acute flare of COPD   . Hemoptysis 08/03/2014  . Post-obstruction pneumonia due to Pseudomonas aeruginosa 08/07/2014    Endobronchial mass causing LUL obstruction  . Anxiety   . Radiation 08/25/14-09/29/14    left upper central lung 45 Gy  . Cancer ([place])   . Seizures ([place])   . Shortness of breath dyspnea   . GERD (gastroesophageal reflux disease)   . Chronic lower back pain   . Constipation due to opioid therapy   . Full dentures   . On supplemental oxygen therapy     2L of O2 at night       Review of Systems  Constitutional: Negative for fever, chills and fatigue.  HENT: Negative for postnasal drip, rhinorrhea and sinus pressure.   Respiratory: Negative for cough, shortness of breath and wheezing.   Cardiovascular: Negative for chest pain, palpitations and leg swelling.       Objective:   Physical Exam  Filed Vitals:   10/30/15 1115  BP: 126/72  Pulse: 91  Height: '5\' 11"'$  (1.803 m)  Weight: 178 lb (80.74 kg)  SpO2: 95%   2L [place]  Gen: well appearing HENT: OP clear, TM's clear, neck supple PULM: few wheezes left lung, diminished air movement CV: RRR, no mgr, trace edema GI: BS+, soft, nontender Derm: no cyanosis or rash Psyche: normal mood and affect   10/2015 CXR report From the [facility] reviewed which showed a left upper lobe opacity  CBC    Component Value Date/Time   WBC 7.9 07/27/2015 1111   WBC 5.6 12/20/2014 0443   RBC 4.91 07/27/2015 1111   RBC 3.17* 12/20/2014 0443   HGB 13.8 07/27/2015 1111   HGB 9.6* 12/20/2014 0443   HCT 42.3 07/27/2015 1111   HCT 29.3* 12/20/2014 0443   PLT 219 07/27/2015 1111   PLT 195 12/20/2014 0443   MCV 86.2 07/27/2015 1111   MCV 92.4 12/20/2014 0443   MCH 28.1 07/27/2015 1111   MCH 30.3 12/20/2014  0443   MCHC 32.6 07/27/2015 1111   MCHC 32.8 12/20/2014 0443   RDW 13.9 07/27/2015 1111   RDW 12.4 12/20/2014 0443   LYMPHSABS 0.8* 07/27/2015 1111   LYMPHSABS 1.8 08/03/2014 1359   MONOABS 1.0* 07/27/2015 1111   MONOABS 0.7 08/03/2014 1359   EOSABS 0.2 07/27/2015 1111   EOSABS 0.3 08/03/2014 1359   BASOSABS 0.0 07/27/2015 1111   BASOSABS 0.0 08/03/2014 1359         Assessment & Plan:   Squamous cell carcinoma of lung, stage II He had what sounded like an exacerbation of COPD a week ago. The chest x-ray he had at the [place] noted that there is a left upper lobe opacity. It's unclear to me if this is just related to his prior left upper lobectomy or if this represents a new abnormality.  Plan: Chest x-ray two-view today to compare to prior studies to ensure there is no evidence of recurrent malignancy.  COPD with chronic bronchitis He had an exacerbation of COPD last week which sounds like was directly related to the fact that he had heavy exhaust fume exposure from his lawnmower. He is working on adjusting this with a Copywriter, advertising.  He has recovered from this episode and apart from that this is been a stable interval for him.  Plan: Continue Symbicort as prescribed Change DuoNeb to albuterol because he's been experiencing leg cramps with DuoNeb Follow-up 6 months or sooner if needed     Current outpatient prescriptions:  .  amLODipine (NORVASC) 10 MG tablet, Take 1 tablet (10 mg total) by mouth daily., Disp: 30 tablet, Rfl: 1 .  Ascorbic Acid (VITAMIN C) 1000 MG tablet, Take 1,000 mg by mouth daily., Disp: , Rfl:  .  Bioflavonoid Products (BIOFLEX PO), Take 1 tablet by mouth 2 (two) times daily. , Disp: , Rfl:  .  clonazePAM (KLONOPIN) 1 MG tablet, Take 1 mg by mouth 2 (two) times daily., Disp: , Rfl:  .  docusate sodium (COLACE) 100 MG capsule, Take 100 mg by mouth 2 (two) times daily., Disp: , Rfl:  .  ibuprofen (ADVIL,MOTRIN) 200 MG tablet, Take 400 mg by mouth 2 (two)  times daily. Reported on 08/03/2015, Disp: , Rfl:  .  ipratropium-albuterol (DUONEB) 0.5-2.5 (3) MG/3ML SOLN, Take 3 mLs by nebulization 4 (four) times daily as needed (Wheezing, shortness of breath). Reported on 08/03/2015, Disp: , Rfl: 5 .  loratadine (CLARITIN) 10 MG tablet, Take 10 mg by mouth every 12 (twelve) hours., Disp: , Rfl:  .  multivitamin-iron-minerals-folic acid (CENTRUM) chewable tablet, Chew 1 tablet by mouth daily., Disp: , Rfl:  .  PROVENTIL  HFA 108 (90 BASE) MCG/ACT inhaler, , Disp: , Rfl:  .  Saw Palmetto, Serenoa repens, (SAW PALMETTO PO), Take 2 tablets by mouth 3 (three) times daily., Disp: , Rfl:  .  ST JOHNS WORT PO, Take 1 tablet by mouth 3 (three) times daily., Disp: , Rfl:  .  SYMBICORT 160-4.5 MCG/ACT inhaler, Inhale 2 puffs into the lungs 2 (two) times daily., Disp: , Rfl:  .  albuterol (PROVENTIL) (2.5 MG/3ML) 0.083% nebulizer solution, Take 3 mLs (2.5 mg total) by nebulization every 6 (six) hours as needed for wheezing or shortness of breath., Disp: 75 mL, Rfl: 12

## 2015-10-30 NOTE — Patient Instructions (Signed)
We will call you with the results of the chest x-ray Use albuterol as needed for shortness of breath Continue taking Symbicort We will see you back in 6 months or sooner if needed

## 2015-10-30 NOTE — Assessment & Plan Note (Signed)
He had an exacerbation of COPD last week which sounds like was directly related to the fact that he had heavy exhaust fume exposure from his lawnmower. He is working on adjusting this with a Copywriter, advertising.  He has recovered from this episode and apart from that this is been a stable interval for him.  Plan: Continue Symbicort as prescribed Change DuoNeb to albuterol because he's been experiencing leg cramps with DuoNeb Follow-up 6 months or sooner if needed

## 2015-11-11 ENCOUNTER — Telehealth: Payer: Self-pay | Admitting: Pulmonary Disease

## 2015-11-12 NOTE — Telephone Encounter (Signed)
LM x 1 for pt  Notes Recorded by Juanito Doom, MD on 11/04/2015 at 9:17 AM A, Please let the patient know this was OK Thanks, B

## 2015-11-13 NOTE — Telephone Encounter (Signed)
lmomtcb for pt 

## 2015-11-16 NOTE — Telephone Encounter (Signed)
lmtcb x3 pt.

## 2015-11-16 NOTE — Telephone Encounter (Signed)
Patient upset about playing phone tag. Advised patient based on the message from Dr. Lake Bells xray was ok. Patient satisfied and no further questions.-prm

## 2016-01-25 ENCOUNTER — Other Ambulatory Visit (HOSPITAL_BASED_OUTPATIENT_CLINIC_OR_DEPARTMENT_OTHER): Payer: Medicare Other

## 2016-01-25 DIAGNOSIS — C3492 Malignant neoplasm of unspecified part of left bronchus or lung: Secondary | ICD-10-CM | POA: Diagnosis present

## 2016-01-25 LAB — CBC WITH DIFFERENTIAL/PLATELET
BASO%: 0.2 % (ref 0.0–2.0)
Basophils Absolute: 0 10*3/uL (ref 0.0–0.1)
EOS%: 3.7 % (ref 0.0–7.0)
Eosinophils Absolute: 0.2 10*3/uL (ref 0.0–0.5)
HEMATOCRIT: 41.2 % (ref 38.4–49.9)
HEMOGLOBIN: 13.7 g/dL (ref 13.0–17.1)
LYMPH#: 0.9 10*3/uL (ref 0.9–3.3)
LYMPH%: 14.8 % (ref 14.0–49.0)
MCH: 28.9 pg (ref 27.2–33.4)
MCHC: 33.3 g/dL (ref 32.0–36.0)
MCV: 86.9 fL (ref 79.3–98.0)
MONO#: 0.8 10*3/uL (ref 0.1–0.9)
MONO%: 13.5 % (ref 0.0–14.0)
NEUT%: 67.8 % (ref 39.0–75.0)
NEUTROS ABS: 4.2 10*3/uL (ref 1.5–6.5)
Platelets: 192 10*3/uL (ref 140–400)
RBC: 4.74 10*6/uL (ref 4.20–5.82)
RDW: 13.3 % (ref 11.0–14.6)
WBC: 6.2 10*3/uL (ref 4.0–10.3)

## 2016-01-25 LAB — COMPREHENSIVE METABOLIC PANEL
ALBUMIN: 3.8 g/dL (ref 3.5–5.0)
ALT: 18 U/L (ref 0–55)
AST: 21 U/L (ref 5–34)
Alkaline Phosphatase: 90 U/L (ref 40–150)
Anion Gap: 9 mEq/L (ref 3–11)
BUN: 15.5 mg/dL (ref 7.0–26.0)
CALCIUM: 9.2 mg/dL (ref 8.4–10.4)
CO2: 27 mEq/L (ref 22–29)
CREATININE: 0.9 mg/dL (ref 0.7–1.3)
Chloride: 106 mEq/L (ref 98–109)
EGFR: 87 mL/min/{1.73_m2} — ABNORMAL LOW (ref >90)
Glucose: 92 mg/dl (ref 70–140)
POTASSIUM: 4.2 meq/L (ref 3.5–5.1)
Sodium: 142 mEq/L (ref 136–145)
Total Bilirubin: 0.48 mg/dL (ref 0.20–1.20)
Total Protein: 7.6 g/dL (ref 6.4–8.3)

## 2016-01-29 ENCOUNTER — Telehealth: Payer: Self-pay | Admitting: *Deleted

## 2016-01-29 ENCOUNTER — Telehealth: Payer: Self-pay | Admitting: Internal Medicine

## 2016-01-29 NOTE — Telephone Encounter (Signed)
Called pt advised his MD appt has been cancelled per MD due to no scan appt prior to visit. Informed pt he will be rescheduled with MD after CT scan has been done. Pt verbalized understanding.

## 2016-01-29 NOTE — Telephone Encounter (Signed)
lvm to inform pt of /s appt from 8/28 to 8/31 due to CT not yet scheduled. Spoke with [facility] and pt is on the call list to schedule appt and made them aware that it needs to be prior to 8/31 f/u.

## 2016-01-29 NOTE — Telephone Encounter (Signed)
Received call from pt stating that he was notified of an appt for Monday with Dr Julien Nordmann & was supposed to have a CT before MD visit.  Order noted & not scheduled.  Called [facility] & was informed that scan has not been preauth.  Notified Darlena/[facility]. Message routed to Dr Julien Nordmann & pod RN

## 2016-02-01 ENCOUNTER — Ambulatory Visit: Payer: Self-pay | Admitting: Internal Medicine

## 2016-02-03 ENCOUNTER — Ambulatory Visit (HOSPITAL_COMMUNITY)
Admission: RE | Admit: 2016-02-03 | Discharge: 2016-02-03 | Disposition: A | Discharge status: Home / Self Care | Payer: Medicare Other | Source: Ambulatory Visit | Attending: Internal Medicine | Admitting: Internal Medicine

## 2016-02-03 ENCOUNTER — Encounter (HOSPITAL_COMMUNITY): Payer: Self-pay

## 2016-02-03 DIAGNOSIS — Z902 Acquired absence of lung [part of]: Secondary | ICD-10-CM | POA: Insufficient documentation

## 2016-02-03 DIAGNOSIS — J439 Emphysema, unspecified: Secondary | ICD-10-CM | POA: Diagnosis not present

## 2016-02-03 DIAGNOSIS — K5730 Diverticulosis of large intestine without perforation or abscess without bleeding: Secondary | ICD-10-CM | POA: Diagnosis not present

## 2016-02-03 DIAGNOSIS — R918 Other nonspecific abnormal finding of lung field: Secondary | ICD-10-CM | POA: Insufficient documentation

## 2016-02-03 DIAGNOSIS — I700 Atherosclerosis of aorta: Secondary | ICD-10-CM | POA: Diagnosis not present

## 2016-02-03 DIAGNOSIS — J701 Chronic and other pulmonary manifestations due to radiation: Secondary | ICD-10-CM | POA: Insufficient documentation

## 2016-02-03 DIAGNOSIS — C3412 Malignant neoplasm of upper lobe, left bronchus or lung: Secondary | ICD-10-CM | POA: Diagnosis not present

## 2016-02-03 DIAGNOSIS — C3492 Malignant neoplasm of unspecified part of left bronchus or lung: Secondary | ICD-10-CM | POA: Diagnosis not present

## 2016-02-03 DIAGNOSIS — J90 Pleural effusion, not elsewhere classified: Secondary | ICD-10-CM | POA: Insufficient documentation

## 2016-02-03 DIAGNOSIS — I2510 Atherosclerotic heart disease of native coronary artery without angina pectoris: Secondary | ICD-10-CM | POA: Insufficient documentation

## 2016-02-03 MED ORDER — IOPAMIDOL (ISOVUE-300) INJECTION 61%
75.0000 mL | Freq: Once | INTRAVENOUS | Status: AC | PRN
  Administered 2016-02-03: 75 mL via INTRAVENOUS

## 2016-02-04 ENCOUNTER — Ambulatory Visit (HOSPITAL_BASED_OUTPATIENT_CLINIC_OR_DEPARTMENT_OTHER): Payer: Medicare Other | Admitting: Internal Medicine

## 2016-02-04 ENCOUNTER — Encounter: Payer: Self-pay | Admitting: Internal Medicine

## 2016-02-04 VITALS — BP 125/84 | HR 91 | Temp 97.8°F | Resp 18 | Ht 71.0 in | Wt 177.9 lb

## 2016-02-04 DIAGNOSIS — C3492 Malignant neoplasm of unspecified part of left bronchus or lung: Secondary | ICD-10-CM

## 2016-02-04 DIAGNOSIS — Z85118 Personal history of other malignant neoplasm of bronchus and lung: Secondary | ICD-10-CM | POA: Diagnosis not present

## 2016-02-04 DIAGNOSIS — J449 Chronic obstructive pulmonary disease, unspecified: Secondary | ICD-10-CM
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
## 2016-02-16 ENCOUNTER — Encounter: Payer: Self-pay | Admitting: Thoracic Surgery (Cardiothoracic Vascular Surgery)

## 2016-03-08 ENCOUNTER — Encounter: Payer: Self-pay | Admitting: Thoracic Surgery (Cardiothoracic Vascular Surgery)

## 2016-05-02 ENCOUNTER — Encounter: Payer: Self-pay | Admitting: Pulmonary Disease

## 2016-05-02 ENCOUNTER — Ambulatory Visit (INDEPENDENT_AMBULATORY_CARE_PROVIDER_SITE_OTHER): Payer: Medicare Other | Admitting: Pulmonary Disease

## 2016-05-02 VITALS — BP 128/66 | HR 101 | Ht 71.0 in | Wt 183.0 lb

## 2016-05-02 DIAGNOSIS — R400 Somnolence: Secondary | ICD-10-CM

## 2016-05-02 DIAGNOSIS — J432 Centrilobular emphysema: Secondary | ICD-10-CM

## 2016-05-02 DIAGNOSIS — J9611 Chronic respiratory failure with hypoxia: Secondary | ICD-10-CM
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
## 2016-05-02 NOTE — Patient Instructions (Addendum)
For your daytime sleepiness and fatigue: I worry you have sleep apnea We will arrange for something called a split-night study which is where they will testy for obstructive sleep apnea  For your COPD: Continue taking Symbicort twice a day Let us know if the green mucus you've been coughing up does not improve  For your chronic respiratory failure with hypoxemia: Keep using the inogen device at night and when you walk around We will arrange to have the oxygen tanks removed from your home  We will see you back in 4 months or sooner if needed

## 2016-08-03 ENCOUNTER — Other Ambulatory Visit (HOSPITAL_BASED_OUTPATIENT_CLINIC_OR_DEPARTMENT_OTHER): Payer: Medicare Other

## 2016-08-03 DIAGNOSIS — Z85118 Personal history of other malignant neoplasm of bronchus and lung: Secondary | ICD-10-CM

## 2016-08-03 DIAGNOSIS — C3492 Malignant neoplasm of unspecified part of left bronchus or lung: Secondary | ICD-10-CM

## 2016-08-03 LAB — COMPREHENSIVE METABOLIC PANEL
ALBUMIN: 3.8 g/dL (ref 3.5–5.0)
ALK PHOS: 94 U/L (ref 40–150)
ALT: 16 U/L (ref 0–55)
ANION GAP: 7 meq/L (ref 3–11)
AST: 17 U/L (ref 5–34)
BILIRUBIN TOTAL: 0.55 mg/dL (ref 0.20–1.20)
BUN: 19.1 mg/dL (ref 7.0–26.0)
CALCIUM: 9 mg/dL (ref 8.4–10.4)
CO2: 28 meq/L (ref 22–29)
CREATININE: 0.9 mg/dL (ref 0.7–1.3)
Chloride: 105 mEq/L (ref 98–109)
EGFR: 87 mL/min/{1.73_m2} — ABNORMAL LOW (ref >90)
Glucose: 101 mg/dl (ref 70–140)
Potassium: 4.3 mEq/L (ref 3.5–5.1)
Sodium: 141 mEq/L (ref 136–145)
TOTAL PROTEIN: 7.6 g/dL (ref 6.4–8.3)

## 2016-08-03 LAB — CBC WITH DIFFERENTIAL/PLATELET
BASO%: 0.8 % (ref 0.0–2.0)
Basophils Absolute: 0 10*3/uL (ref 0.0–0.1)
EOS ABS: 0.4 10*3/uL (ref 0.0–0.5)
EOS%: 6.8 % (ref 0.0–7.0)
HEMATOCRIT: 42.1 % (ref 38.4–49.9)
HEMOGLOBIN: 14.1 g/dL (ref 13.0–17.1)
LYMPH#: 0.9 10*3/uL (ref 0.9–3.3)
LYMPH%: 16.3 % (ref 14.0–49.0)
MCH: 28.4 pg (ref 27.2–33.4)
MCHC: 33.4 g/dL (ref 32.0–36.0)
MCV: 85 fL (ref 79.3–98.0)
MONO#: 0.7 10*3/uL (ref 0.1–0.9)
MONO%: 12.7 % (ref 0.0–14.0)
NEUT%: 63.4 % (ref 39.0–75.0)
NEUTROS ABS: 3.5 10*3/uL (ref 1.5–6.5)
PLATELETS: 211 10*3/uL (ref 140–400)
RBC: 4.95 10*6/uL (ref 4.20–5.82)
RDW: 14.1 % (ref 11.0–14.6)
WBC: 5.5 10*3/uL (ref 4.0–10.3)

## 2016-08-09 ENCOUNTER — Encounter (HOSPITAL_COMMUNITY): Payer: Self-pay

## 2016-08-09 ENCOUNTER — Ambulatory Visit (HOSPITAL_COMMUNITY)
Admission: RE | Admit: 2016-08-09 | Discharge: 2016-08-09 | Disposition: A | Discharge status: Home / Self Care | Payer: Medicare Other | Source: Ambulatory Visit | Attending: Internal Medicine | Admitting: Internal Medicine

## 2016-08-09 DIAGNOSIS — Z9889 Other specified postprocedural states: Secondary | ICD-10-CM | POA: Insufficient documentation

## 2016-08-09 DIAGNOSIS — C3492 Malignant neoplasm of unspecified part of left bronchus or lung: Secondary | ICD-10-CM | POA: Insufficient documentation

## 2016-08-09 DIAGNOSIS — R918 Other nonspecific abnormal finding of lung field: Secondary | ICD-10-CM | POA: Diagnosis not present

## 2016-08-09 MED ORDER — IOPAMIDOL (ISOVUE-300) INJECTION 61%
INTRAVENOUS | Status: AC
  Administered 2016-08-09: 75 mL
  Filled 2016-08-09: qty 75

## 2016-08-10 ENCOUNTER — Ambulatory Visit (HOSPITAL_BASED_OUTPATIENT_CLINIC_OR_DEPARTMENT_OTHER): Payer: Medicare Other | Admitting: Internal Medicine

## 2016-08-10 ENCOUNTER — Encounter: Payer: Self-pay | Admitting: Internal Medicine

## 2016-08-10 ENCOUNTER — Telehealth: Payer: Self-pay | Admitting: Internal Medicine

## 2016-08-10 VITALS — BP 137/79 | HR 81 | Temp 97.5°F | Resp 18 | Wt 183.8 lb

## 2016-08-10 DIAGNOSIS — R0602 Shortness of breath: Secondary | ICD-10-CM | POA: Diagnosis not present

## 2016-08-10 DIAGNOSIS — C3492 Malignant neoplasm of unspecified part of left bronchus or lung: Secondary | ICD-10-CM

## 2016-08-10 DIAGNOSIS — Z85118 Personal history of other malignant neoplasm of bronchus and lung: Secondary | ICD-10-CM

## 2016-08-10 NOTE — Telephone Encounter (Signed)
Gave patient avs report and appointments for September. Central radiology will call re scans.
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
## 2016-08-19 ENCOUNTER — Ambulatory Visit: Payer: Self-pay | Admitting: Pulmonary Disease

## 2016-08-23 IMAGING — CR DG ORTHOPANTOGRAM /PANORAMIC
1 series · 1 of 1 positions shown · non-contrast
Comparison: None.

CLINICAL DATA: Recent diagnosis of lung cancer.  Poor dentition.

EXAM:
ORTHOPANTOGRAM/PANORAMIC

[view not recorded]
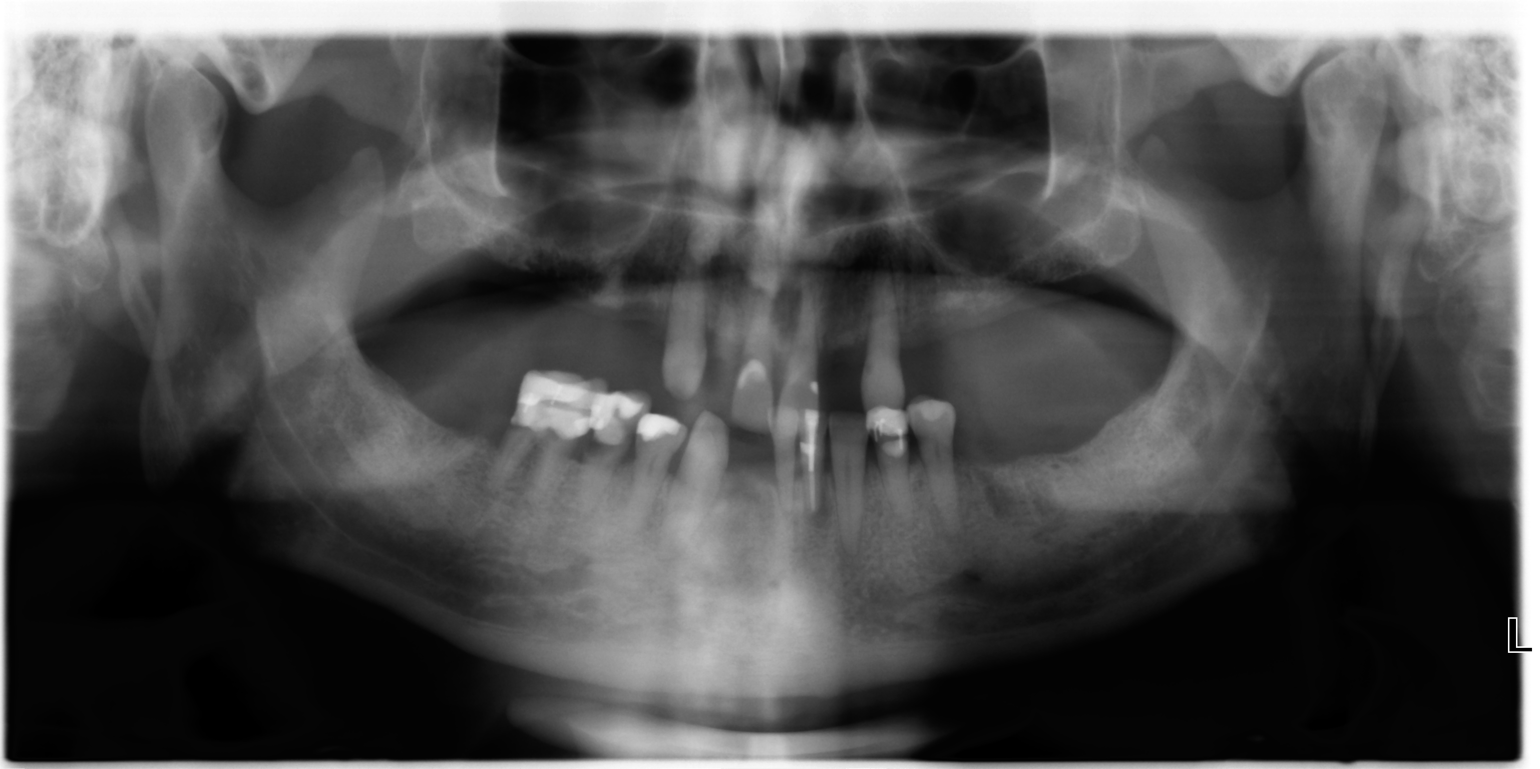

[1 of 1 positions shown; findings below may reference images not displayed]

FINDINGS: The patient is missing multiple teeth. Patient has had intervention
to many of other remaining teeth. Probable root canal involving the
left lower lateral incisor (#23). There is also a periapical lucency
involving this left lower lateral incisor which could be related to
infection. The temporomandibular joints appear to be located
bilaterally without gross abnormality. Mandible is intact. Maxillary
sinuses are aerated. The right upper lateral incisor has either been
fractured or incompletely removed.
IMPRESSION: Multiple tooth extractions and prior dental interventions.

Periapical lucency involving the left lower lateral incisor with
changes suggestive for a root canal in this tooth.

## 2016-08-23 IMAGING — DX DG CHEST 2V
2 series · 2 of 2 positions shown · non-contrast
Comparison: Chest CT August 06, 2014 and chest radiograph August 07, 2014

CLINICAL DATA: Pneumothorax ; lung mass

EXAM:
CHEST  2 VIEW

[chest pa]
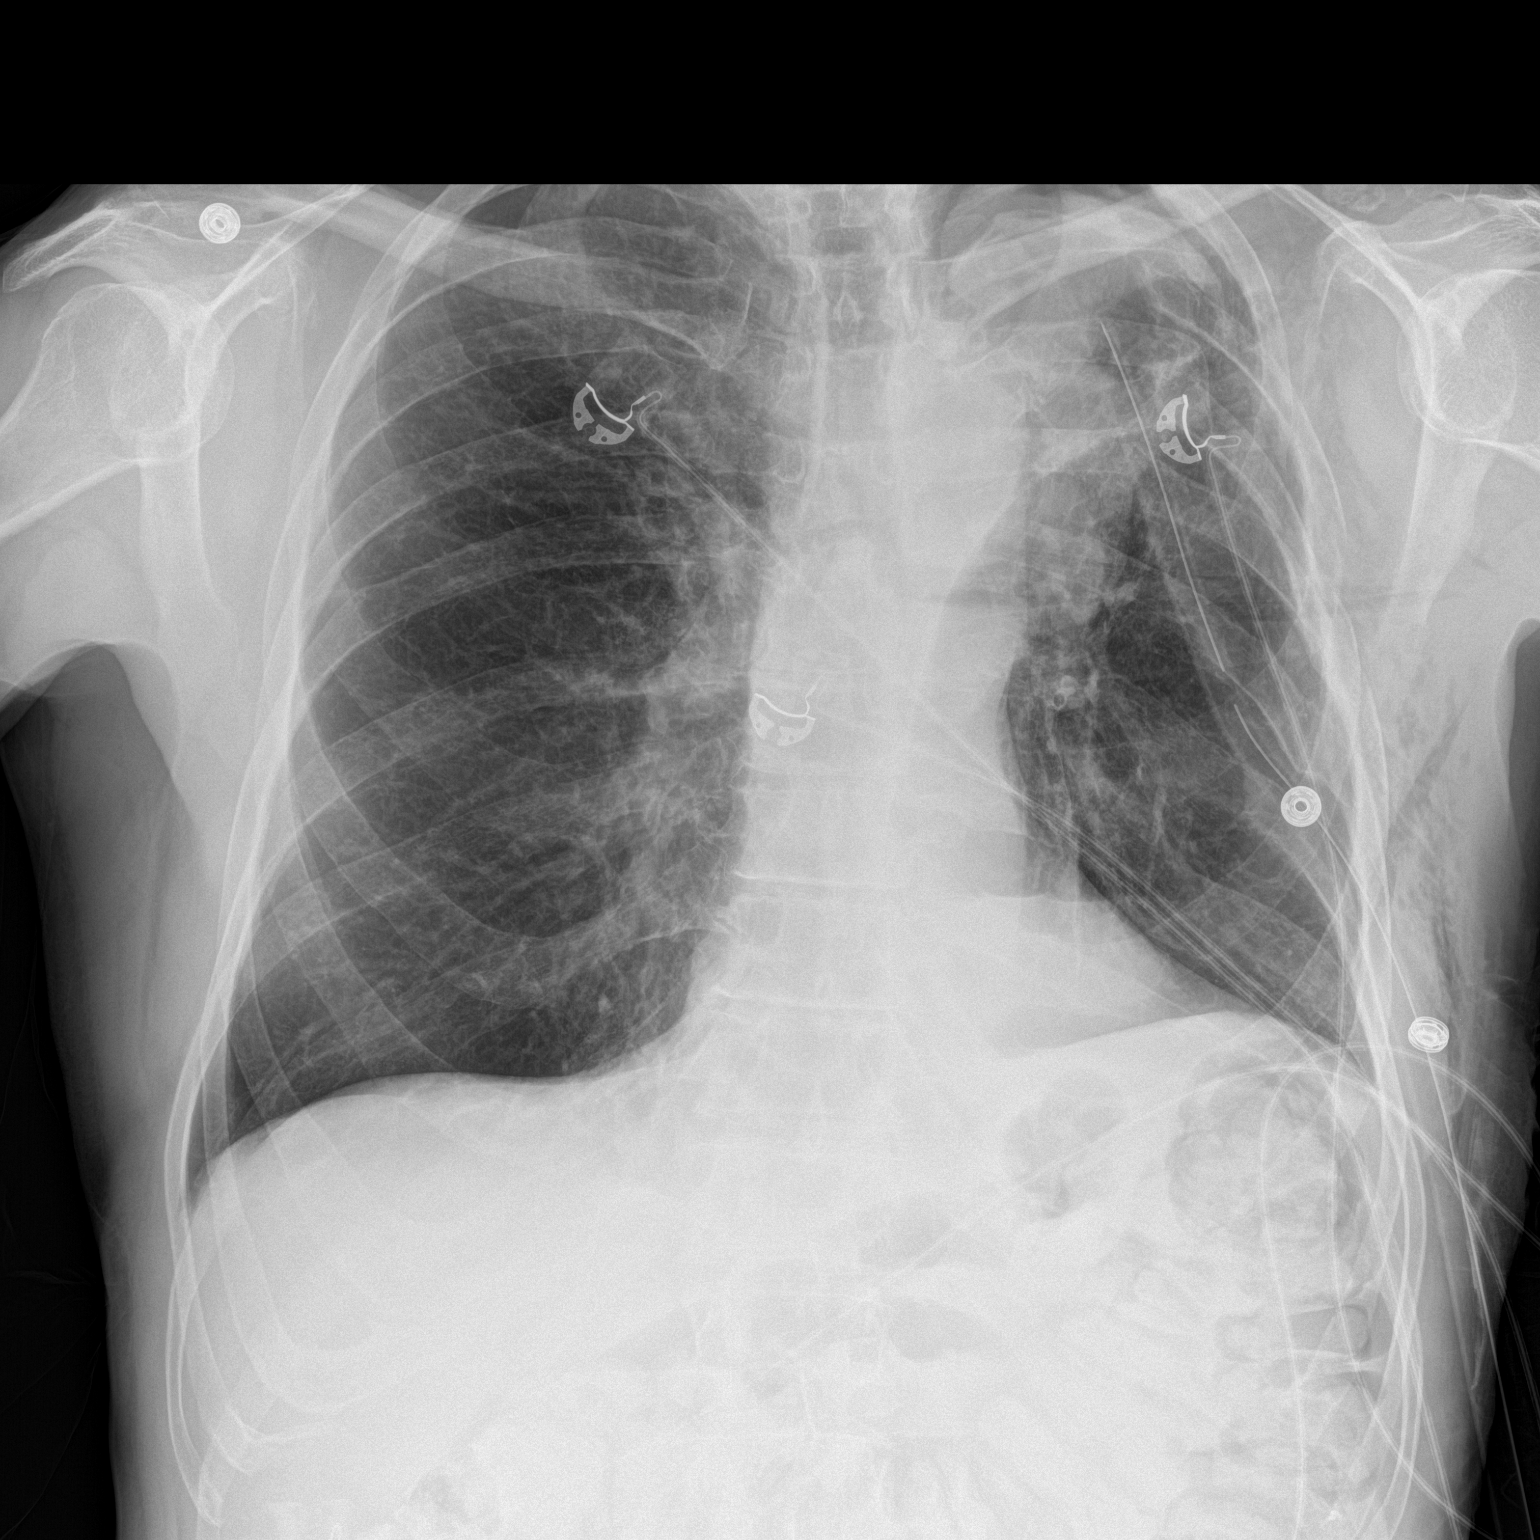

[chest lat]
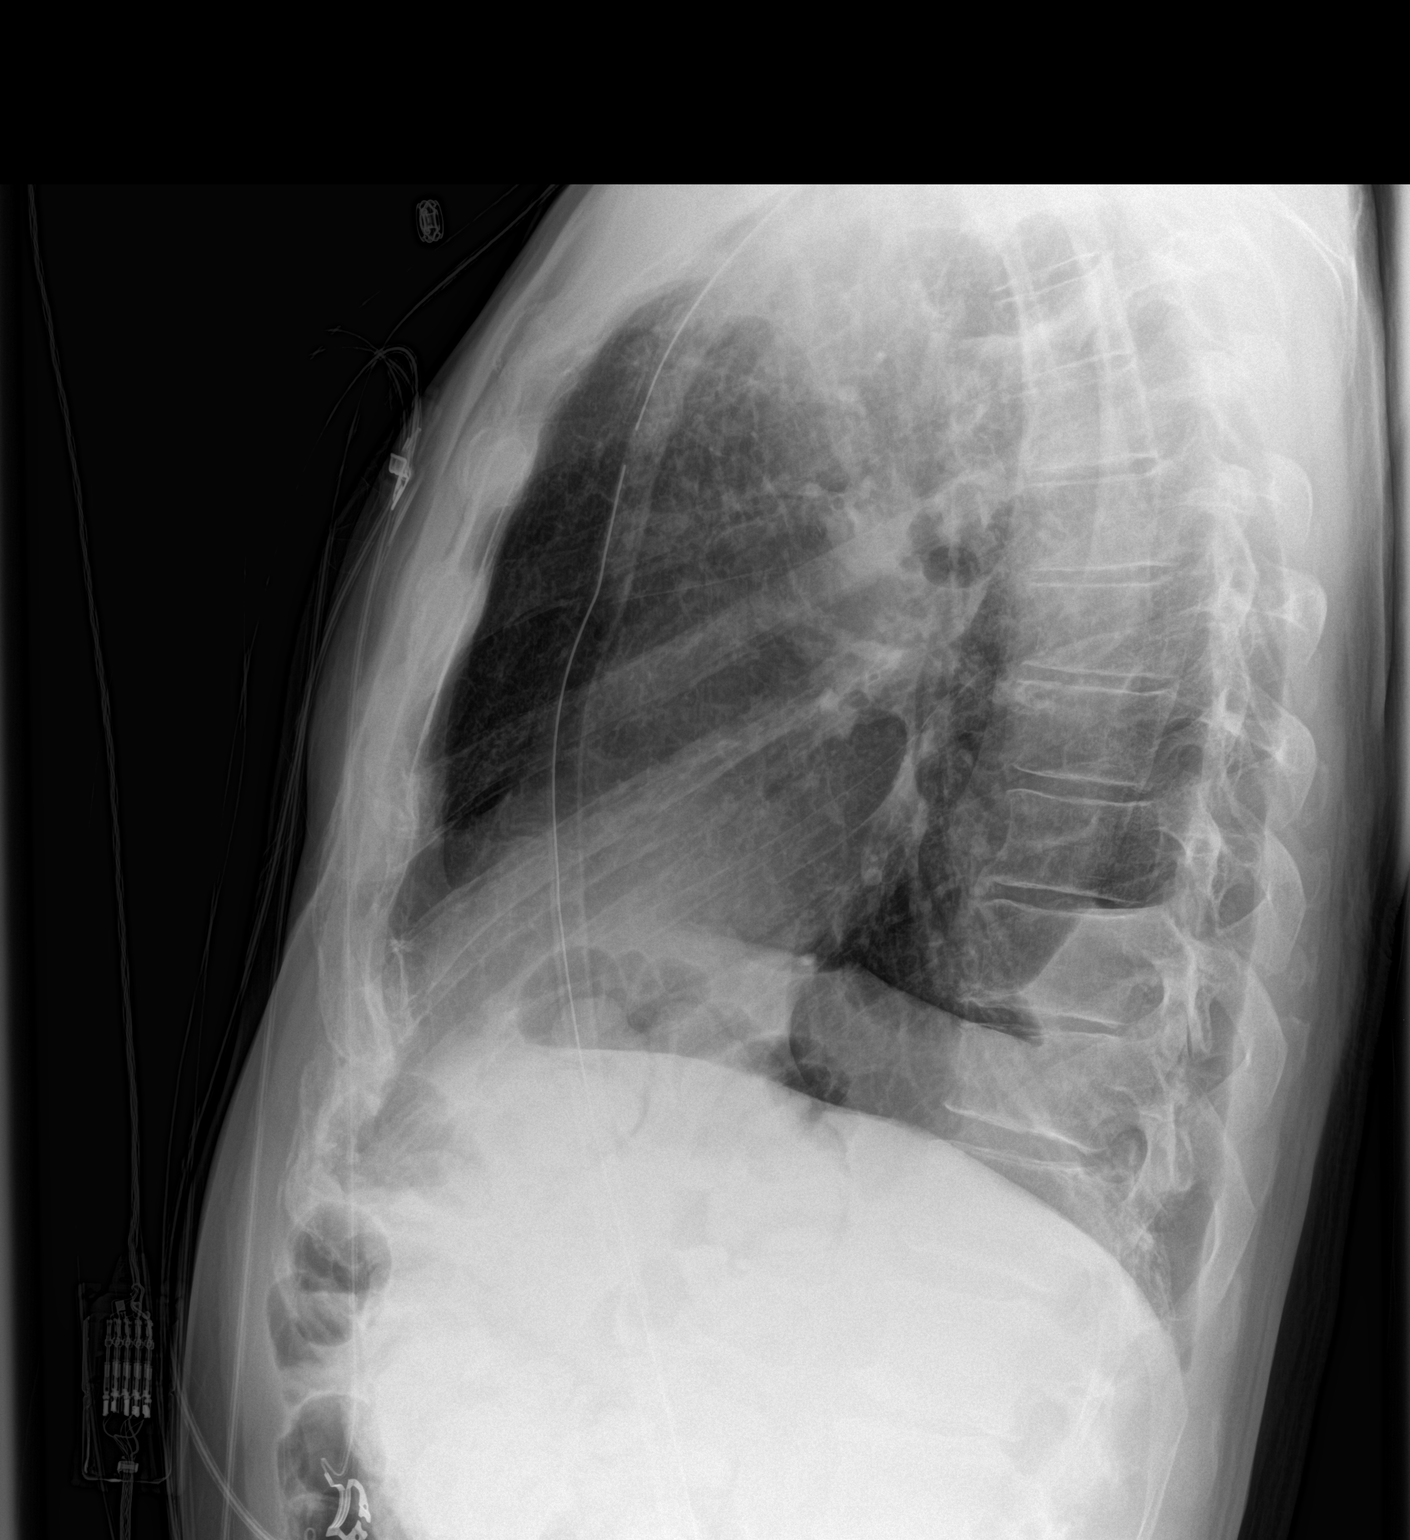

[2 of 2 positions shown; findings below may reference images not displayed]

FINDINGS: Chest tube remains on the left, unchanged in position. Small left
apical pneumothorax persists without change. There is consolidation
in the left upper lobe with soft tissue fullness in the upper left
hilum. Right lung is clear. There is underlying emphysema. Heart
size is normal. The pulmonary vascularity is stable. No new opacity.
No bone lesions.
IMPRESSION: Stable pneumothorax on the left with chest tube in place. Stable
consolidation in soft tissue fullness on the left in the upper
lobe/apical region. No new opacity. Underlying emphysema. No change
in cardiac silhouette.

## 2016-08-23 IMAGING — CR DG CHEST 1V PORT
1 series · 1 of 1 positions shown · non-contrast
Comparison: 08/08/2014

CLINICAL DATA: Post left chest tube removal

EXAM:
PORTABLE CHEST - 1 VIEW

[AP]
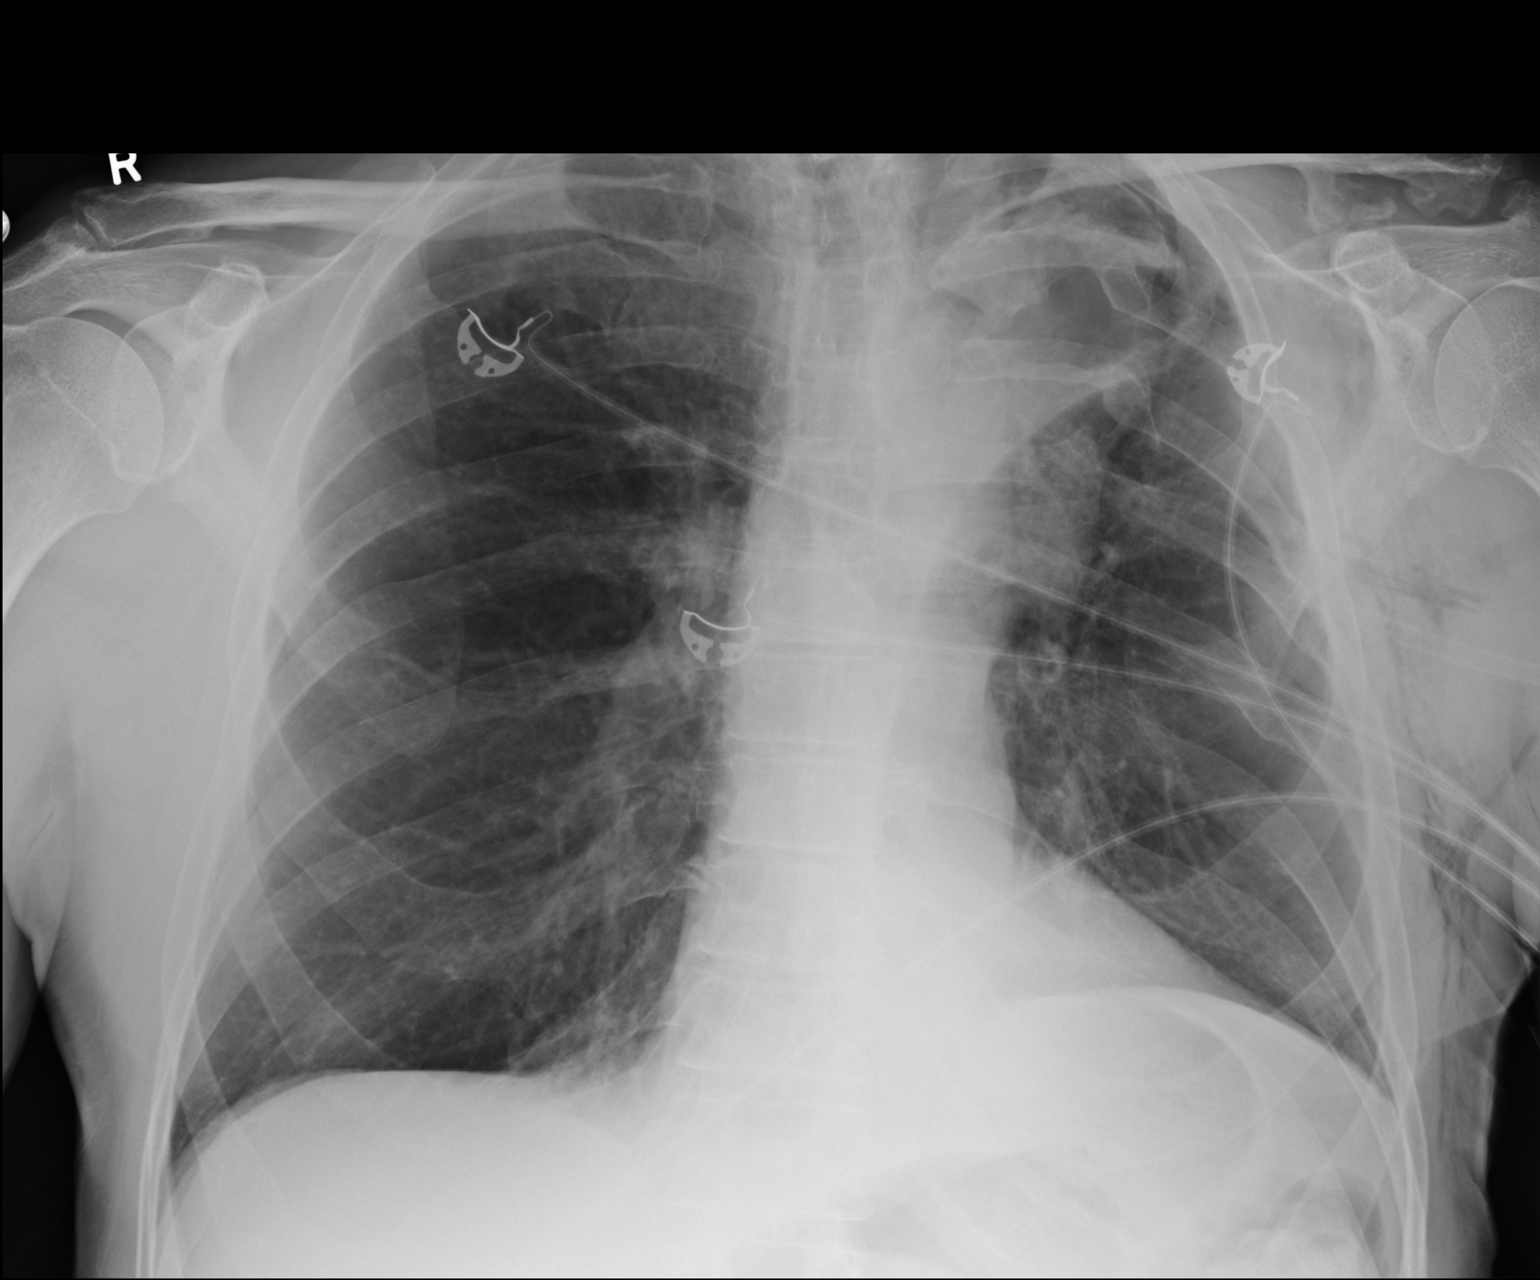

[1 of 1 positions shown; findings below may reference images not displayed]

FINDINGS: Cardiomediastinal silhouette is stable. Multiple left rib fractures
are again noted. The left chest tube has been removed. Stable small
left apical pneumothorax. Subcutaneous emphysema left chest wall
again noted. Right lung is clear.
IMPRESSION: Left chest tube has been removed. Stable small left apical
pneumothorax. Right lung is clear.

## 2016-08-28 IMAGING — CR DG CHEST 2V
2 series · 2 of 2 positions shown · non-contrast
Comparison: 08/09/2014

CLINICAL DATA: Spontaneous pneumothorax on 08/03/2014, history
COPD, smoking, hypertension, nonsmall cell carcinoma LEFT lung

EXAM:
CHEST  2 VIEW

[w chest pa]
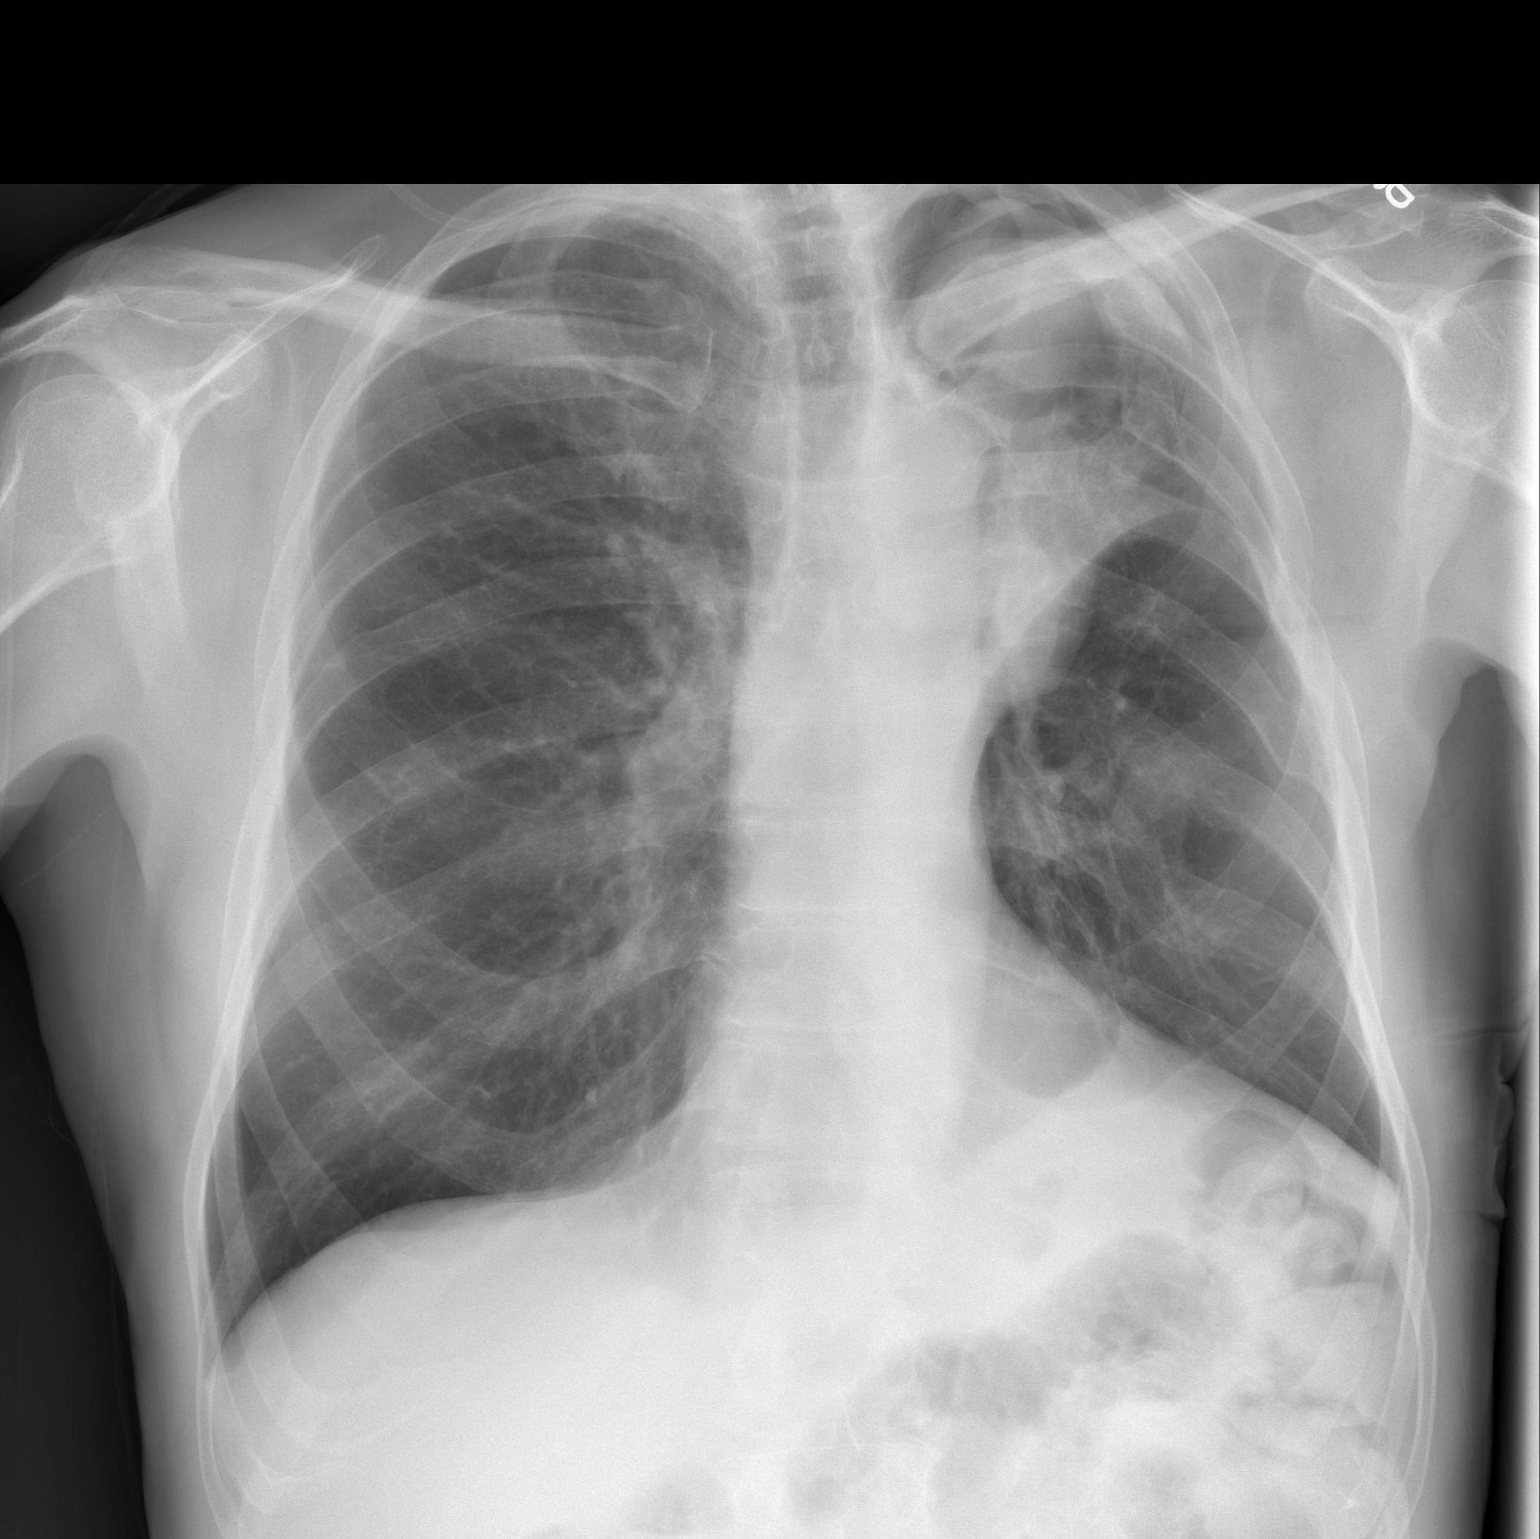

[w chest lat]
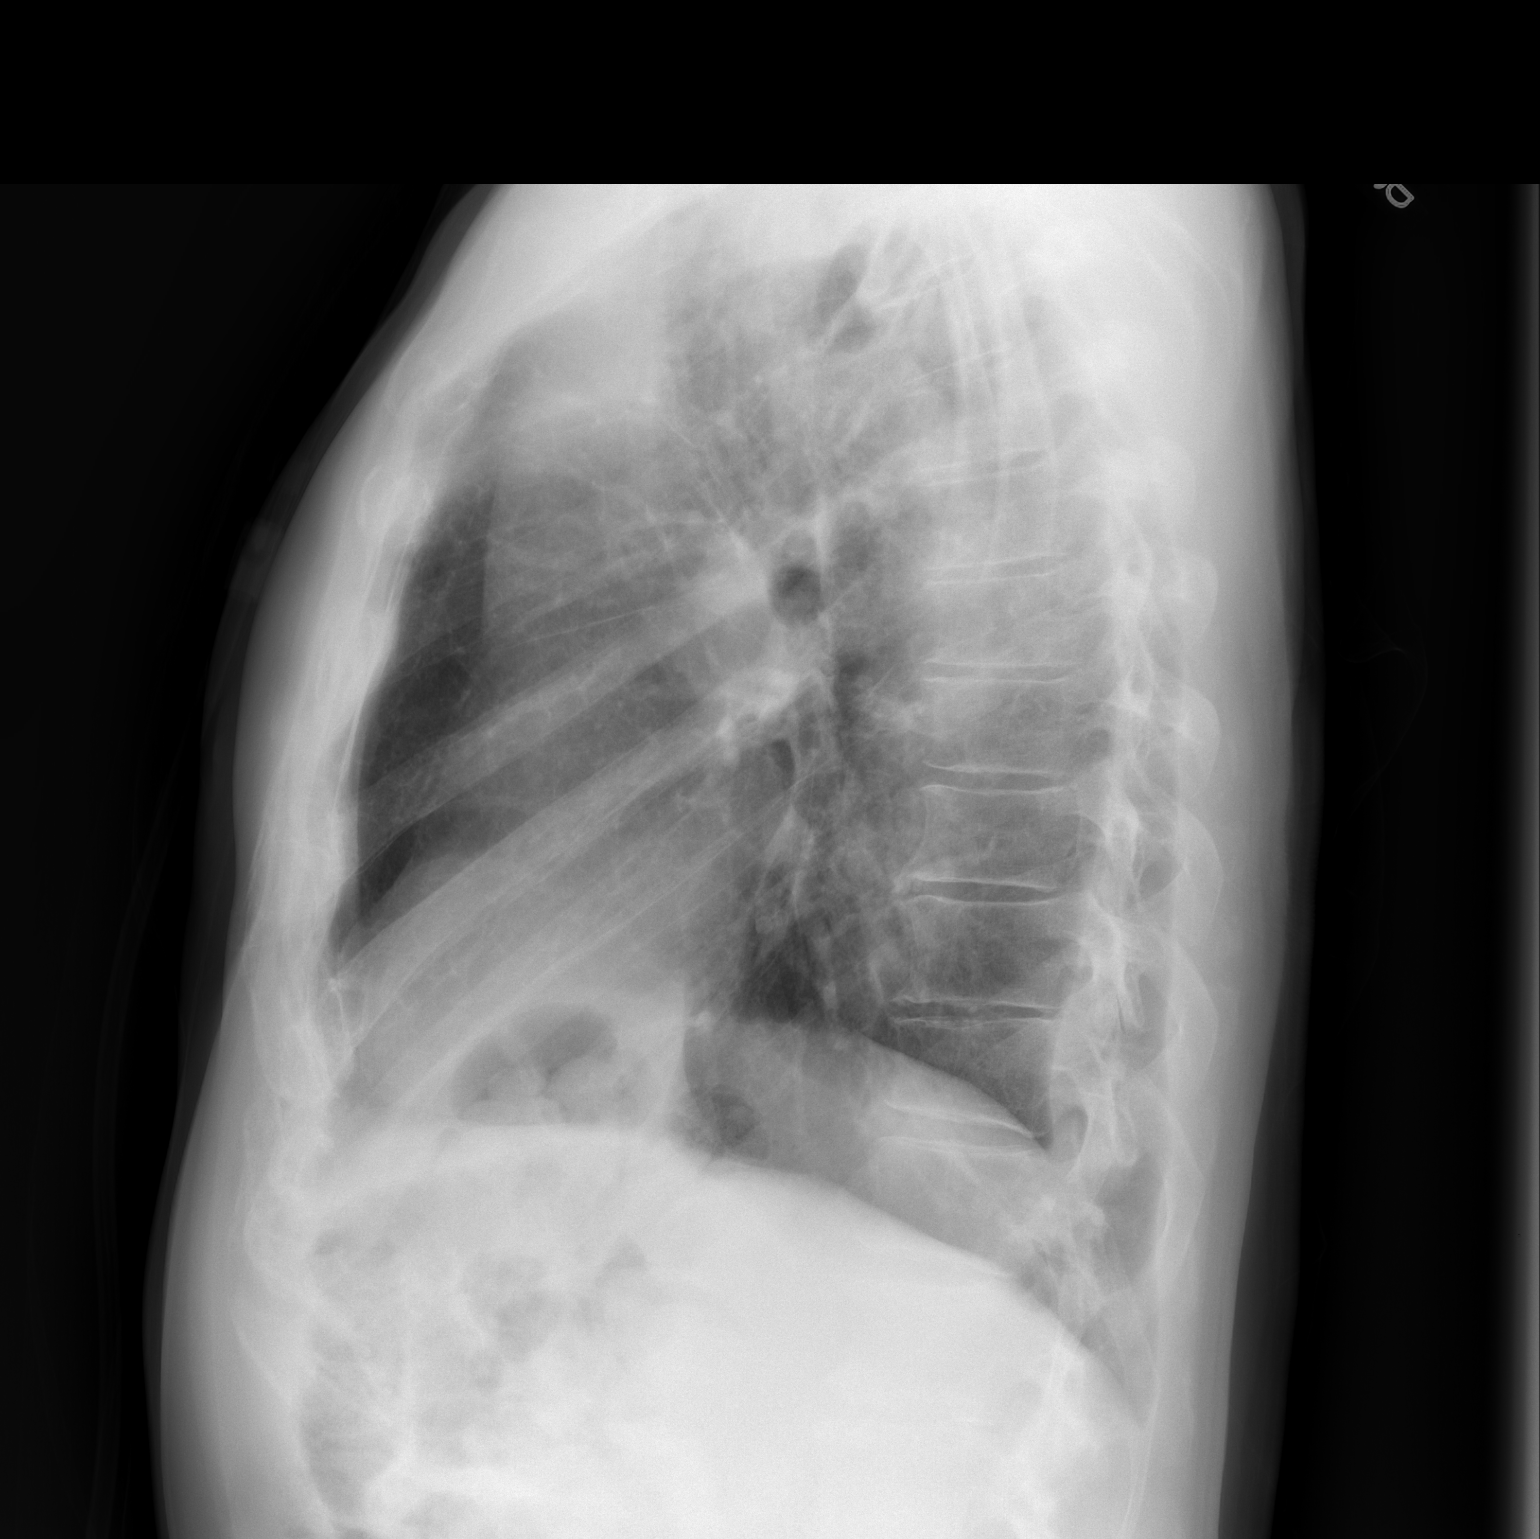

[2 of 2 positions shown; findings below may reference images not displayed]

FINDINGS: Normal heart size and pulmonary vascularity.

Persistent LEFT upper lobe atelectasis and question LEFT hilar
enlargement.

Persistent small LEFT apex pneumothorax, minimally decreased.

Underlying emphysematous changes.

Remaining lungs clear.

No pleural effusion or additional mass/nodule.

Bones demineralized.
IMPRESSION: Emphysematous changes with persistent LEFT upper lobe atelectasis
and known underlying tumor.

Minimal decrease in size of LEFT apex pneumothorax.

## 2016-08-31 ENCOUNTER — Encounter: Payer: Self-pay | Admitting: Pulmonary Disease

## 2016-08-31 ENCOUNTER — Ambulatory Visit (INDEPENDENT_AMBULATORY_CARE_PROVIDER_SITE_OTHER): Payer: Medicare Other | Admitting: Pulmonary Disease

## 2016-08-31 VITALS — BP 126/74 | HR 94 | Ht 71.0 in | Wt 181.0 lb

## 2016-08-31 DIAGNOSIS — J449 Chronic obstructive pulmonary disease, unspecified: Secondary | ICD-10-CM

## 2016-08-31 DIAGNOSIS — R0600 Dyspnea, unspecified: Secondary | ICD-10-CM | POA: Diagnosis not present

## 2016-08-31 DIAGNOSIS — J9611 Chronic respiratory failure with hypoxia: Secondary | ICD-10-CM

## 2016-08-31 DIAGNOSIS — Z720 Tobacco use: Secondary | ICD-10-CM

## 2016-08-31 DIAGNOSIS — J4489 Other specified chronic obstructive pulmonary disease: Secondary | ICD-10-CM

## 2016-08-31 MED ORDER — UMECLIDINIUM BROMIDE 62.5 MCG/INH IN AEPB: 1.0000 | INHALATION_SPRAY | Freq: Every day | RESPIRATORY_TRACT | 5 refills | Status: DC
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
## 2016-08-31 NOTE — Addendum Note (Signed)
Addended by: Len Blalock on: 08/31/2016 04:20 PM   Modules accepted: Orders

## 2016-08-31 NOTE — Assessment & Plan Note (Signed)
Today we checked a simple spirometry test which shows a dramatic drop in his FEV1 compared to the June 2016 study notably, the June 2016 study was performed prior to his left upper lobectomy but I believe that this drop reflects worsening COPD despite the lobectomy.  Plan: Add Incruise 1 puff daily Continue Symbicort 2 puffs twice a day Continue albuterol as needed Follow-up in 4-6 weeks with the nurse practitioner to make sure symptoms are improving with the new medicine

## 2016-08-31 NOTE — Patient Instructions (Signed)
In addition to Symbicort I would like for you to take Incruise 1 puff daily Follow-up in 4-6 weeks with our nurse practitioner to see how you're doing with this medicine

## 2016-08-31 NOTE — Assessment & Plan Note (Signed)
Claims abstinence. Encouraged him to stay away from cigarettes.

## 2016-08-31 NOTE — Assessment & Plan Note (Signed)
Continue 2L qHS.

## 2016-09-08 ENCOUNTER — Telehealth: Payer: Self-pay | Admitting: Pulmonary Disease

## 2016-09-08 MED ORDER — UMECLIDINIUM BROMIDE 62.5 MCG/INH IN AEPB: 1.0000 | INHALATION_SPRAY | Freq: Every day | RESPIRATORY_TRACT | 11 refills | Status: DC

## 2016-09-08 NOTE — Telephone Encounter (Signed)
Spoke with the pt  Incruse is working great  He needs [name] to go ahead and send fax to the [place] along with ov note   CDY- can you please sign for this? I went ahead and placed rx on your cart along with ov note review if needed  Please return to triage once done, thanks!

## 2016-09-08 NOTE — Telephone Encounter (Signed)
I have faxed rx and the ov note to the number given  Pt aware and states nothing further needed

## 2016-09-08 NOTE — Telephone Encounter (Signed)
done

## 2016-09-29 ENCOUNTER — Encounter: Payer: Self-pay | Admitting: Adult Health

## 2016-09-29 ENCOUNTER — Ambulatory Visit (INDEPENDENT_AMBULATORY_CARE_PROVIDER_SITE_OTHER): Payer: Medicare Other | Admitting: Adult Health

## 2016-09-29 VITALS — BP 104/72 | HR 99 | Ht 71.0 in | Wt 182.0 lb

## 2016-09-29 DIAGNOSIS — J9611 Chronic respiratory failure with hypoxia: Secondary | ICD-10-CM | POA: Diagnosis not present

## 2016-09-29 DIAGNOSIS — J449 Chronic obstructive pulmonary disease, unspecified: Secondary | ICD-10-CM

## 2016-09-29 DIAGNOSIS — C3490 Malignant neoplasm of unspecified part of unspecified bronchus or lung: Secondary | ICD-10-CM

## 2016-09-29 MED ORDER — PREDNISONE 10 MG PO TABS: ORAL_TABLET | ORAL | 0 refills | Status: DC

## 2016-09-29 NOTE — Progress Notes (Signed)
$'@Patient'W$  ID: Jeffrey Hines, male    DOB: [birth year], 71 y.o.   MRN: [id number]  Chief Complaint  Patient presents with  . Follow-up    Referring provider: [facility], [facility]  HPI: 71 year old male followed for COPD, squamous cell carcinoma of the left upper lung diagnosed in 2016, Pseudomonas pneumonia., Nocturnal Hypoxia on O2 At bedtime  .   TEST [facility]  -Pseudomonas pneumonia. 2016  -Squama cell carcinoma 2016 status post chemotherapy and radiation. -March 2016 pulmonary function test> ratio 50%, FEV1 L (49% predicted, 18% change with bronchodilator), total lung capacity 6.73 L (93% predicted), DLCO 11.13 (33% predicted). June 2016 pulmonary function testing ratio 46%, FEV1 2.47 L (71% Pred), total lung capacity 7.79 L (107% predicted), DLCO 10.62 (31% predicted). 11/2014 ONO < 88% 2 hours -March 2018 simple spirometry test: Ratio 42%, FEV1 1.16 L 34% predicted, FVC 2.76 60% percent predicted -08/31/2016> Walked 500 feet on room air O2 saturation ranged between 91 and 94%  -March 2018 CT chest: Status post left upper lobectomy, some mild centrilobular emphysema, mild fibrotic change right lower lobe, bronchovascular distribution question aspiration, images independently reviewed -Records from the [place] facility reviewed were his sleep study showed restless leg syndrome, low oxygen when sleeping but no evidence of obstructive sleep apnea.  09/29/2016 Follow up : COPD , Lung cancer  Patient presents for a one-month follow-up. Last visit. Patient was having some increased shortness of breath and intermittent wheezing. Promptly showed a dramatic drop in his FEV1. Patient was felt to have some progressive COPD changes. Patient was started on INCRUSE in addition to his  Symbicort regimen . Was approved coverage from [place] .  He says he feels that this is helped with decreased shortness of breath. Does feel that over last week with increased pollen outside is causing his wheezing to be worse  .  Exhaled nitric oxide test today is 34   Allergies  Allergen Reactions  . Prolixin [Fluphenazine]     Severe back pain  . Advair Diskus [Fluticasone-Salmeterol] Anxiety    Immunization History  Administered Date(s) Administered  . Influenza Split 04/14/2015  . Influenza Whole 02/27/2014  . Influenza, High Dose Seasonal PF 06/02/2016  . Pneumococcal Conjugate-13 04/14/2015  . Pneumococcal Polysaccharide-23 05/03/2016    Past Medical History:  Diagnosis Date  . Anxiety   . Cancer ([place])   . Chronic lower back pain   . Closed head injury 1998  . Constipation due to opioid therapy   . COPD (chronic obstructive pulmonary disease) ([place])   . COPD with chronic bronchitis ([place])   . Full dentures   . GERD (gastroesophageal reflux disease)   . Hemoptysis 08/03/2014  . Hypertension   . Multiple rib fractures 1998   left side  . On supplemental oxygen therapy    2L of O2 at night  . Post-obstruction pneumonia due to Pseudomonas aeruginosa 08/07/2014   Endobronchial mass causing LUL obstruction  . Radiation 08/25/14-09/29/14   left upper central lung 45 Gy  . Seizures ([place])   . Shortness of breath dyspnea   . Shoulder dislocation 1998   left  . Spontaneous pneumothorax 08/03/2014   Left side 1st time episode spontaneous pneumothorax associated with acute flare of COPD   . Tobacco abuse     Tobacco History: History  Smoking Status  . Former Smoker  . Packs/day: 0.50  . Years: 50.00  . Types: Cigarettes  . Quit date: 08/03/2014  Smokeless Tobacco  . Never Used   Counseling given:  Not Answered   Outpatient Encounter Prescriptions as of 09/29/2016  Medication Sig  . albuterol (PROVENTIL) (2.5 MG/3ML) 0.083% nebulizer solution Take 3 mLs (2.5 mg total) by nebulization every 6 (six) hours as needed for wheezing or shortness of breath.  Marland Kitchen amLODipine (NORVASC) 10 MG tablet Take 1 tablet (10 mg total) by mouth daily.  Marland Kitchen Bioflavonoid Products (BIOFLEX PO) Take 1 tablet by mouth 2  (two) times daily.   . cholecalciferol (VITAMIN D) 1000 units tablet Take 1,000 Units by mouth daily.  . clonazePAM (KLONOPIN) 0.5 MG tablet Take 0.5 mg by mouth 3 (three) times daily.  Marland Kitchen docusate sodium (COLACE) 100 MG capsule Take 100 mg by mouth 2 (two) times daily.  Marland Kitchen ibuprofen (ADVIL,MOTRIN) 200 MG tablet Take 400 mg by mouth 2 (two) times daily. Reported on 08/03/2015  . multivitamin-iron-minerals-folic acid (CENTRUM) chewable tablet Chew 1 tablet by mouth daily.  Marland Kitchen PROVENTIL HFA 108 (90 BASE) MCG/ACT inhaler   . Saw Palmetto, Serenoa repens, (SAW PALMETTO PO) Take 2 tablets by mouth 3 (three) times daily.  . ST JOHNS WORT PO Take 1 tablet by mouth 3 (three) times daily.  . SYMBICORT 160-4.5 MCG/ACT inhaler Inhale 2 puffs into the lungs 2 (two) times daily.  Marland Kitchen umeclidinium bromide (INCRUSE ELLIPTA) 62.5 MCG/INH AEPB Inhale 1 puff into the lungs daily.  . vitamin E (VITAMIN E) 400 UNIT capsule Take 400 Units by mouth daily.  . [DISCONTINUED] ipratropium-albuterol (DUONEB) 0.5-2.5 (3) MG/3ML SOLN Take 3 mLs by nebulization 4 (four) times daily as needed (Wheezing, shortness of breath). Reported on 08/03/2015   No facility-administered encounter medications on file as of 09/29/2016.      Review of Systems  Constitutional:   No  weight loss, night sweats,  Fevers, chills, fatigue, or  lassitude.  HEENT:   No headaches,  Difficulty swallowing,  Tooth/dental problems, or  Sore throat,                No sneezing, itching, ear ache, + nasal congestion, post nasal drip,   CV:  No chest pain,  Orthopnea, PND, swelling in lower extremities, anasarca, dizziness, palpitations, syncope.   GI  No heartburn, indigestion, abdominal pain, nausea, vomiting, diarrhea, change in bowel habits, loss of appetite, bloody stools.   Resp:   No chest wall deformity  Skin: no rash or lesions.  GU: no dysuria, change in color of urine, no urgency or frequency.  No flank pain, no hematuria   MS:  No joint  pain or swelling.  No decreased range of motion.  No back pain.    Physical Exam  BP 104/72 (BP Location: Left Arm, Cuff Size: Normal)   Pulse 99   Ht '5\' 11"'$  (1.803 m)   Wt 182 lb (82.6 kg)   SpO2 95%   BMI 25.38 kg/m   GEN: A/Ox3; pleasant , NAD, thin    HEENT:  [place]/AT,  EACs-clear, TMs-wnl, NOSE-clear, THROAT-clear, no lesions, no postnasal drip or exudate noted.   NECK:  Supple w/ fair ROM; no JVD; normal carotid impulses w/o bruits; no thyromegaly or nodules palpated; no lymphadenopathy.    RESP  Few exp wheezes . no accessory muscle use, no dullness to percussion  CARD:  RRR, no m/r/g, no peripheral edema, pulses intact, no cyanosis or clubbing.  GI:   Soft & nt; nml bowel sounds; no organomegaly or masses detected.   Musco: Warm bil, no deformities or joint swelling noted.   Neuro: alert, no focal deficits noted.  Skin: Warm, no lesions or rashes   Lab Results:  CBC   ProBNP No results found for: PROBNP  Imaging: No results found.   Assessment & Plan:   No problem-specific Assessment & Plan notes found for this encounter.     Rexene Edison, NP 09/29/2016

## 2016-09-29 NOTE — Assessment & Plan Note (Addendum)
Exacerbation ? AR flare  /Asthmatic/RAD component (elevated FENO)  Add Claritin .  Short steroid challenge   Plan  . Patient Instructions  Prednisone taper over next week.  Claritin '10mg'$  daily for 5 days then As needed  Drainage .  Continue on Symbicort 2 puffs Twice daily  .  Continue on Incruse 1 puff Twice daily   Follow up .Dr. Lake Bells in 3 months and As needed   Please contact office for sooner follow up if symptoms do not improve or worsen or seek emergency care

## 2016-09-29 NOTE — Assessment & Plan Note (Addendum)
Cont follow up with Oncology .  Recent CT chest w/ no evidence of recurrence.

## 2016-09-29 NOTE — Assessment & Plan Note (Signed)
Cont on O2 At bedtime   

## 2016-09-29 NOTE — Patient Instructions (Signed)
Prednisone taper over next week.  Claritin '10mg'$  daily for 5 days then As needed  Drainage .  Continue on Symbicort 2 puffs Twice daily  .  Continue on Incruse 1 puff Twice daily   Follow up .Dr. Lake Bells in 3 months and As needed   Please contact office for sooner follow up if symptoms do not improve or worsen or seek emergency care

## 2016-10-10 NOTE — Progress Notes (Signed)
Reviewed, agree 

## 2016-12-30 ENCOUNTER — Ambulatory Visit: Payer: Self-pay | Admitting: Pulmonary Disease

## 2017-01-13 ENCOUNTER — Telehealth: Payer: Self-pay | Admitting: Pulmonary Disease

## 2017-01-13 MED ORDER — PREDNISONE 10 MG PO TABS: ORAL_TABLET | ORAL | 0 refills | Status: DC

## 2017-01-13 MED ORDER — AMOXICILLIN-POT CLAVULANATE 875-125 MG PO TABS: 1.0000 | ORAL_TABLET | Freq: Two times a day (BID) | ORAL | 0 refills | Status: DC
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
## 2017-01-13 NOTE — Telephone Encounter (Signed)
Called and spoke to pt. Informed him of the recs per MW. Rx sent to preferred pharmacy. Pt verbalized understanding and denied any further questions or concerns at this time.   

## 2017-01-13 NOTE — Telephone Encounter (Signed)
Augmentin 875 mg take one pill twice daily  X 10 days - take at breakfast and supper with large glass of water.  It would help reduce the usual side effects (diarrhea and yeast infections) if you ate cultured yogurt at lunch.   Prednisone 10 mg take  4 each am x 2 days,   2 each am x 2 days,  1 each am x 2 days and stop

## 2017-01-23 ENCOUNTER — Telehealth: Payer: Self-pay | Admitting: Internal Medicine

## 2017-01-23 NOTE — Telephone Encounter (Signed)
Left message for patient regarding appointment on 9/17.

## 2017-02-08 ENCOUNTER — Ambulatory Visit (HOSPITAL_COMMUNITY)
Admission: RE | Admit: 2017-02-08 | Discharge: 2017-02-08 | Disposition: A | Discharge status: Home / Self Care | Payer: Medicare Other | Source: Ambulatory Visit | Attending: Internal Medicine | Admitting: Internal Medicine

## 2017-02-08 ENCOUNTER — Other Ambulatory Visit (HOSPITAL_BASED_OUTPATIENT_CLINIC_OR_DEPARTMENT_OTHER): Payer: Medicare Other

## 2017-02-08 ENCOUNTER — Telehealth: Payer: Self-pay | Admitting: Medical Oncology

## 2017-02-08 DIAGNOSIS — I700 Atherosclerosis of aorta: Secondary | ICD-10-CM | POA: Diagnosis not present

## 2017-02-08 DIAGNOSIS — R918 Other nonspecific abnormal finding of lung field: Secondary | ICD-10-CM | POA: Diagnosis not present

## 2017-02-08 DIAGNOSIS — J439 Emphysema, unspecified: Secondary | ICD-10-CM | POA: Diagnosis not present

## 2017-02-08 DIAGNOSIS — C3492 Malignant neoplasm of unspecified part of left bronchus or lung: Secondary | ICD-10-CM | POA: Diagnosis not present

## 2017-02-08 LAB — COMPREHENSIVE METABOLIC PANEL
ALT: 9 U/L (ref 0–55)
ANION GAP: 8 meq/L (ref 3–11)
AST: 15 U/L (ref 5–34)
Albumin: 3.3 g/dL — ABNORMAL LOW (ref 3.5–5.0)
Alkaline Phosphatase: 75 U/L (ref 40–150)
BILIRUBIN TOTAL: 0.31 mg/dL (ref 0.20–1.20)
BUN: 13.5 mg/dL (ref 7.0–26.0)
CO2: 28 meq/L (ref 22–29)
CREATININE: 0.9 mg/dL (ref 0.7–1.3)
Calcium: 9 mg/dL (ref 8.4–10.4)
Chloride: 105 mEq/L (ref 98–109)
EGFR: 88 mL/min/{1.73_m2} — ABNORMAL LOW (ref >90)
GLUCOSE: 113 mg/dL (ref 70–140)
Potassium: 3.5 mEq/L (ref 3.5–5.1)
SODIUM: 142 meq/L (ref 136–145)
TOTAL PROTEIN: 7.4 g/dL (ref 6.4–8.3)

## 2017-02-08 LAB — CBC WITH DIFFERENTIAL/PLATELET
BASO%: 0.2 % (ref 0.0–2.0)
Basophils Absolute: 0 10*3/uL (ref 0.0–0.1)
EOS%: 1.8 % (ref 0.0–7.0)
Eosinophils Absolute: 0.1 10*3/uL (ref 0.0–0.5)
HCT: 38.9 % (ref 38.4–49.9)
HGB: 12.9 g/dL — ABNORMAL LOW (ref 13.0–17.1)
LYMPH%: 14.4 % (ref 14.0–49.0)
MCH: 28.4 pg (ref 27.2–33.4)
MCHC: 33.2 g/dL (ref 32.0–36.0)
MCV: 85.7 fL (ref 79.3–98.0)
MONO#: 0.5 10*3/uL (ref 0.1–0.9)
MONO%: 8.5 % (ref 0.0–14.0)
NEUT%: 75.1 % — ABNORMAL HIGH (ref 39.0–75.0)
NEUTROS ABS: 4.7 10*3/uL (ref 1.5–6.5)
PLATELETS: 218 10*3/uL (ref 140–400)
RBC: 4.54 10*6/uL (ref 4.20–5.82)
RDW: 12.8 % (ref 11.0–14.6)
WBC: 6.3 10*3/uL (ref 4.0–10.3)
lymph#: 0.9 10*3/uL (ref 0.9–3.3)

## 2017-02-08 MED ORDER — IOPAMIDOL (ISOVUE-300) INJECTION 61%
INTRAVENOUS | Status: AC
  Filled 2017-02-08: qty 75

## 2017-02-08 MED ORDER — IOPAMIDOL (ISOVUE-300) INJECTION 61%
75.0000 mL | Freq: Once | INTRAVENOUS | Status: AC | PRN
  Administered 2017-02-08: 75 mL via INTRAVENOUS

## 2017-02-08 NOTE — Telephone Encounter (Signed)
I left message for pt to come for labs today at 1345 before his CT scan. I told him to call back and confirm and he can leave message with triage or me.

## 2017-02-13 ENCOUNTER — Telehealth: Payer: Self-pay | Admitting: Pulmonary Disease

## 2017-02-13 ENCOUNTER — Other Ambulatory Visit: Payer: Self-pay

## 2017-02-13 NOTE — Telephone Encounter (Signed)
Patient has been scheduled with BQ for 02/16/17 at 245.

## 2017-02-13 NOTE — Telephone Encounter (Signed)
Needs OV.  

## 2017-02-13 NOTE — Telephone Encounter (Signed)
Spoke with patient. He stated that he has finished all of the augmentin and prednisone prescribed on 01/13/17. He is still having a productive cough with green mucus. He also has a slight fever and achy bones. Increased fatigue.  He stated that this has been going on all Summer.   Patient wishes [facility] [place].   BQ, please advise. Thanks!

## 2017-02-16 ENCOUNTER — Ambulatory Visit (INDEPENDENT_AMBULATORY_CARE_PROVIDER_SITE_OTHER): Payer: Medicare Other | Admitting: Pulmonary Disease

## 2017-02-16 ENCOUNTER — Encounter: Payer: Self-pay | Admitting: Pulmonary Disease

## 2017-02-16 ENCOUNTER — Telehealth: Payer: Self-pay | Admitting: Pulmonary Disease

## 2017-02-16 ENCOUNTER — Other Ambulatory Visit (INDEPENDENT_AMBULATORY_CARE_PROVIDER_SITE_OTHER): Payer: Medicare Other

## 2017-02-16 VITALS — BP 130/78 | HR 106 | Ht 71.0 in | Wt 180.2 lb

## 2017-02-16 DIAGNOSIS — R938 Abnormal findings on diagnostic imaging of other specified body structures: Secondary | ICD-10-CM

## 2017-02-16 DIAGNOSIS — J9611 Chronic respiratory failure with hypoxia: Secondary | ICD-10-CM

## 2017-02-16 DIAGNOSIS — Z01818 Encounter for other preprocedural examination: Secondary | ICD-10-CM | POA: Diagnosis not present

## 2017-02-16 DIAGNOSIS — C3490 Malignant neoplasm of unspecified part of unspecified bronchus or lung: Secondary | ICD-10-CM | POA: Diagnosis not present

## 2017-02-16 DIAGNOSIS — R9389 Abnormal findings on diagnostic imaging of other specified body structures: Secondary | ICD-10-CM

## 2017-02-16 LAB — CBC WITH DIFFERENTIAL/PLATELET
BASOS PCT: 0.6 % (ref 0.0–3.0)
Basophils Absolute: 0 10*3/uL (ref 0.0–0.1)
EOS PCT: 1.6 % (ref 0.0–5.0)
Eosinophils Absolute: 0.1 10*3/uL (ref 0.0–0.7)
HCT: 39 % (ref 39.0–52.0)
Hemoglobin: 13.1 g/dL (ref 13.0–17.0)
LYMPHS ABS: 0.8 10*3/uL (ref 0.7–4.0)
LYMPHS PCT: 9.3 % — AB (ref 12.0–46.0)
MCHC: 33.7 g/dL (ref 30.0–36.0)
MCV: 84.3 fl (ref 78.0–100.0)
MONOS PCT: 10.5 % (ref 3.0–12.0)
Monocytes Absolute: 0.9 10*3/uL (ref 0.1–1.0)
NEUTROS ABS: 6.6 10*3/uL (ref 1.4–7.7)
NEUTROS PCT: 78 % — AB (ref 43.0–77.0)
Platelets: 339 10*3/uL (ref 150.0–400.0)
RBC: 4.62 Mil/uL (ref 4.22–5.81)
RDW: 13.2 % (ref 11.5–15.5)
WBC: 8.4 10*3/uL (ref 4.0–10.5)

## 2017-02-16 MED ORDER — PREDNISONE 20 MG PO TABS: 20.0000 mg | ORAL_TABLET | Freq: Every day | ORAL | 0 refills | Status: DC

## 2017-02-16 NOTE — Patient Instructions (Signed)
For your cough and congestion: You need to have a bronchoscopy for further evaluation given the abnormal findings on the CT scan of your chest We will collect a blood test today to make sure that your platelet count is okay We will see you back in about 6 weeks ago the results  For your sinus congestion due to allergic rhinitis:  Keep taking Claritin Keep using Flonase  For your lung cancer: We will perform the bronchoscopy next week  Keep your appointment with Dr. Earlie Server

## 2017-02-16 NOTE — Telephone Encounter (Signed)
No antibiotic; I am performing a bronchoscopy on him next week and I do not want him to be on an antibiotic because it will interfere with the results of the test.  If his symptoms worsen then call us back.

## 2017-02-16 NOTE — Telephone Encounter (Signed)
Pt states he stopped to pick up some lumber after his OV with BQ today. pt states he developed some body ache, weakness, prod cough with green mucus.  Pt is [facility] [place].  BQ please advise. Thanks.

## 2017-02-16 NOTE — Progress Notes (Signed)
Subjective:    Patient ID: Jeffrey Hines, male    DOB: 03-03-1946, 71 y.o.   MRN: [id number]  Synopsis: Jeffrey Hines was diagnosed with squamous cell carcinoma of the left upper lung in 2016. He was hospitalized for Pseudomonas pneumonia at the same time. He started treatment with chemotherapy and radiation therapy in 2016. He also has COPD.  After chemo/radiation treatment of his left upper lobe malignancy he had a left upper lobectomy in the summer 2016. March 2016 pulmonary function test> ratio 50%, FEV1 L (49% predicted, 18% change with bronchodilator), total lung capacity 6.73 L (93% predicted), DLCO 11.13 (33% predicted). June 2016 pulmonary function testing ratio 46%, FEV1 2.47 L (71% Pred), total lung capacity 7.79 L (107% predicted), DLCO 10.62 (31% predicted). 11/2014 ONO < 88% 2 hours March 2018 simple spirometry test: Ratio 42%, FEV1 1.16 L 34% predicted, FVC 2.76 60% percent predicted  HPI Chief Complaint  Patient presents with  . Acute Visit    pt complains of cough x 3 months. coughs up muscous the color of green. some shortness of breath with exertion some wheezing.    Jeffrey Hines says that off and on all summer he has not been doing too well.  He says that he has required antibiotics frequently for green mucus production.  Some days are better than others.  He has some pain throughout his body frequently and feels like he has the flu.  He sometimes has chills, feels feverish.  He has had night sweats.  He doesn't think that this.    He has a lot of sinus congestion and takes claritin which helps, but he doesn't take anything else.    Past Medical History:  Diagnosis Date  . Anxiety   . Cancer ([place])   . Chronic lower back pain   . Closed head injury 1998  . Constipation due to opioid therapy   . COPD (chronic obstructive pulmonary disease) ([place])   . COPD with chronic bronchitis ([place])   . Full dentures   . GERD (gastroesophageal reflux disease)   . Hemoptysis 08/03/2014  .  Hypertension   . Multiple rib fractures 1998   left side  . On supplemental oxygen therapy    2L of O2 at night  . Post-obstruction pneumonia due to Pseudomonas aeruginosa 08/07/2014   Endobronchial mass causing LUL obstruction  . Radiation 08/25/14-09/29/14   left upper central lung 45 Gy  . Seizures ([place])   . Shortness of breath dyspnea   . Shoulder dislocation 1998   left  . Spontaneous pneumothorax 08/03/2014   Left side 1st time episode spontaneous pneumothorax associated with acute flare of COPD   . Tobacco abuse        Review of Systems  Constitutional: Negative for chills, fatigue and fever.  HENT: Negative for postnasal drip, rhinorrhea and sinus pressure.   Respiratory: Positive for shortness of breath and wheezing. Negative for cough.   Cardiovascular: Negative for chest pain, palpitations and leg swelling.       Objective:   Physical Exam  Vitals:   02/16/17 1404  BP: 130/78  Pulse: (!) 106  SpO2: 94%  Weight: 180 lb 4 oz (81.8 kg)  Height: 5\' 11"  (1.803 m)   RA  08/31/2016> Walked 500 feet on room air O2 saturation ranged between 91 and 94%   Gen: well appearing HENT: OP clear, TM's clear, neck supple PULM: Wheezing on the left, right lung clear B, normal percussion CV: RRR, no mgr, trace edema GI:  BS+, soft, nontender Derm: no cyanosis or rash Psyche: normal mood and affect    Records from his March 2018 visit with oncology reviewed were there is no evidence of worsening disease, the plan was to continue observation alone.  March 2018 CT chest: Status post left upper lobectomy, some mild centrilobular emphysema, mild fibrotic change right lower lobe, bronchovascular distribution question aspiration, images independently reviewed  Records from the [place] facility reviewed were his sleep study showed restless leg syndrome, low oxygen when sleeping but no evidence of obstructive sleep apnea.  CBC    Component Value Date/Time   WBC 6.3 02/08/2017 1343    WBC 5.6 12/20/2014 0443   RBC 4.54 02/08/2017 1343   RBC 3.17 (L) 12/20/2014 0443   HGB 12.9 (L) 02/08/2017 1343   HCT 38.9 02/08/2017 1343   PLT 218 02/08/2017 1343   MCV 85.7 02/08/2017 1343   MCH 28.4 02/08/2017 1343   MCH 30.3 12/20/2014 0443   MCHC 33.2 02/08/2017 1343   MCHC 32.8 12/20/2014 0443   RDW 12.8 02/08/2017 1343   LYMPHSABS 0.9 02/08/2017 1343   MONOABS 0.5 02/08/2017 1343   EOSABS 0.1 02/08/2017 1343   BASOSABS 0.0 02/08/2017 1343   Chest imaging:  September 2018 CT chest images independently reviewed showing that he is status post left lower lobectomy, there is significant nodular consolidation and tree-in-bud abnormalities in the left lung, some mucus plugging.      Assessment & Plan:   Pre-procedural examination - Plan: CBC w/Diff  Malignant neoplasm of lung, unspecified laterality, unspecified part of lung (HCC)  Chronic respiratory failure with hypoxia (HCC)  Abnormal CT of the chest  Discussion: I am concerned about Jeffrey Hines. Specifically, he shows signs and symptoms of an atypical infection with night sweats, persistent cough with mucus production which is responsive to antibiotics, body aches, and fevers and chills. The CT scan of his chest was worrisome for pneumonia, however I am concerned about the possibility of something atypical like a fungal or mycobacterial process. Given his lung cancer history the best approach moving forward is for Jeffrey Hines to perform a bronchoscopy to assess further.  Plan: For your cough and congestion: You need to have a bronchoscopy for further evaluation given the abnormal findings on the CT scan of your chest We will collect a blood test today to make sure that your platelet count is okay We will see you back in about 6 weeks ago the results  For your sinus congestion due to allergic rhinitis:  Keep taking Claritin Keep using Flonase  For your lung cancer: We will perform the bronchoscopy next week  Keep your  appointment with Dr. Earlie Hines   Current Outpatient Prescriptions:  .  albuterol (PROVENTIL) (2.5 MG/3ML) 0.083% nebulizer solution, Take 3 mLs (2.5 mg total) by nebulization every 6 (six) hours as needed for wheezing or shortness of breath., Disp: 75 mL, Rfl: 12 .  amLODipine (NORVASC) 10 MG tablet, Take 1 tablet (10 mg total) by mouth daily., Disp: 30 tablet, Rfl: 1 .  Bioflavonoid Products (BIOFLEX PO), Take 1 tablet by mouth 2 (two) times daily. , Disp: , Rfl:  .  cholecalciferol (VITAMIN D) 1000 units tablet, Take 1,000 Units by mouth daily., Disp: , Rfl:  .  clonazePAM (KLONOPIN) 0.5 MG tablet, Take 0.5 mg by mouth 3 (three) times daily., Disp: , Rfl:  .  docusate sodium (COLACE) 100 MG capsule, Take 100 mg by mouth 2 (two) times daily., Disp: , Rfl:  .  ibuprofen (  ADVIL,MOTRIN) 200 MG tablet, Take 400 mg by mouth 2 (two) times daily. Reported on 08/03/2015, Disp: , Rfl:  .  loratadine (CLARITIN) 10 MG tablet, Take 10 mg by mouth daily., Disp: , Rfl:  .  multivitamin-iron-minerals-folic acid (CENTRUM) chewable tablet, Chew 1 tablet by mouth daily., Disp: , Rfl:  .  PROVENTIL HFA 108 (90 BASE) MCG/ACT inhaler, , Disp: , Rfl:  .  Saw Palmetto, Serenoa repens, (SAW PALMETTO PO), Take 2 tablets by mouth 3 (three) times daily., Disp: , Rfl:  .  ST JOHNS WORT PO, Take 1 tablet by mouth 3 (three) times daily., Disp: , Rfl:  .  SYMBICORT 160-4.5 MCG/ACT inhaler, Inhale 2 puffs into the lungs 2 (two) times daily., Disp: , Rfl:  .  umeclidinium bromide (INCRUSE ELLIPTA) 62.5 MCG/INH AEPB, Inhale 1 puff into the lungs daily., Disp: 30 each, Rfl: 11 .  vitamin E (VITAMIN E) 400 UNIT capsule, Take 400 Units by mouth daily., Disp: , Rfl:

## 2017-02-16 NOTE — Telephone Encounter (Signed)
Spoke with pt, aware of recs.  Pt states that if he cannot have abx he is requesting prednisone.  BQ verbally ok'ed pred 20mg  X5 days.  rx sent to preferred pharmacy, pt aware of recs. Nothing further needed.

## 2017-02-20 ENCOUNTER — Ambulatory Visit: Payer: Self-pay | Admitting: Internal Medicine

## 2017-02-21 ENCOUNTER — Telehealth: Payer: Self-pay | Admitting: Internal Medicine

## 2017-02-21 ENCOUNTER — Encounter: Payer: Self-pay | Admitting: Internal Medicine

## 2017-02-21 ENCOUNTER — Ambulatory Visit (HOSPITAL_BASED_OUTPATIENT_CLINIC_OR_DEPARTMENT_OTHER): Payer: Medicare Other | Admitting: Internal Medicine

## 2017-02-21 VITALS — BP 113/79 | HR 104 | Resp 18 | Ht 71.0 in | Wt 178.7 lb

## 2017-02-21 DIAGNOSIS — J189 Pneumonia, unspecified organism: Secondary | ICD-10-CM

## 2017-02-21 DIAGNOSIS — J449 Chronic obstructive pulmonary disease, unspecified: Secondary | ICD-10-CM

## 2017-02-21 DIAGNOSIS — R0609 Other forms of dyspnea: Secondary | ICD-10-CM

## 2017-02-21 DIAGNOSIS — J4489 Other specified chronic obstructive pulmonary disease: Secondary | ICD-10-CM

## 2017-02-21 DIAGNOSIS — R05 Cough: Secondary | ICD-10-CM | POA: Diagnosis not present

## 2017-02-21 DIAGNOSIS — C3492 Malignant neoplasm of unspecified part of left bronchus or lung: Secondary | ICD-10-CM

## 2017-02-21 DIAGNOSIS — C3490 Malignant neoplasm of unspecified part of unspecified bronchus or lung: Secondary | ICD-10-CM

## 2017-02-21 DIAGNOSIS — Z85118 Personal history of other malignant neoplasm of bronchus and lung: Secondary | ICD-10-CM | POA: Diagnosis not present
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
## 2017-02-21 NOTE — Telephone Encounter (Signed)
Scheduled appt per 9/18 los - Gave patient AVS and calender per los. [facility] to contact patient with ct schedule.

## 2017-02-22 ENCOUNTER — Telehealth: Payer: Self-pay | Admitting: Pulmonary Disease

## 2017-02-22 ENCOUNTER — Ambulatory Visit (HOSPITAL_COMMUNITY)
Admission: RE | Admit: 2017-02-22 | Discharge: 2017-02-22 | Disposition: A | Discharge status: Home / Self Care | Payer: Medicare Other | Source: Ambulatory Visit | Attending: Pulmonary Disease | Admitting: Pulmonary Disease

## 2017-02-22 ENCOUNTER — Encounter (HOSPITAL_COMMUNITY): Payer: Self-pay | Admitting: Respiratory Therapy

## 2017-02-22 ENCOUNTER — Encounter (HOSPITAL_COMMUNITY)
Admission: RE | Disposition: A | Discharge status: Home / Self Care | Payer: Self-pay | Source: Ambulatory Visit | Attending: Pulmonary Disease

## 2017-02-22 DIAGNOSIS — J8410 Pulmonary fibrosis, unspecified: Secondary | ICD-10-CM | POA: Insufficient documentation

## 2017-02-22 DIAGNOSIS — R918 Other nonspecific abnormal finding of lung field: Secondary | ICD-10-CM

## 2017-02-22 DIAGNOSIS — Z801 Family history of malignant neoplasm of trachea, bronchus and lung: Secondary | ICD-10-CM | POA: Diagnosis not present

## 2017-02-22 DIAGNOSIS — Z85118 Personal history of other malignant neoplasm of bronchus and lung: Secondary | ICD-10-CM | POA: Diagnosis not present

## 2017-02-22 DIAGNOSIS — J441 Chronic obstructive pulmonary disease with (acute) exacerbation: Secondary | ICD-10-CM | POA: Diagnosis not present

## 2017-02-22 DIAGNOSIS — C3490 Malignant neoplasm of unspecified part of unspecified bronchus or lung: Secondary | ICD-10-CM | POA: Insufficient documentation

## 2017-02-22 DIAGNOSIS — I10 Essential (primary) hypertension: Secondary | ICD-10-CM | POA: Insufficient documentation

## 2017-02-22 DIAGNOSIS — J449 Chronic obstructive pulmonary disease, unspecified: Secondary | ICD-10-CM | POA: Diagnosis not present

## 2017-02-22 DIAGNOSIS — J209 Acute bronchitis, unspecified: Secondary | ICD-10-CM | POA: Diagnosis not present

## 2017-02-22 DIAGNOSIS — J40 Bronchitis, not specified as acute or chronic: Secondary | ICD-10-CM | POA: Diagnosis not present

## 2017-02-22 HISTORY — PX: VIDEO BRONCHOSCOPY: SHX5072

## 2017-02-22 SURGERY — BRONCHOSCOPY, WITH FLUOROSCOPY
Anesthesia: Moderate Sedation | Laterality: Bilateral

## 2017-02-22 MED ORDER — LIDOCAINE HCL 1 % IJ SOLN
INTRAMUSCULAR | Status: DC | PRN
  Administered 2017-02-22: 6 mL via RESPIRATORY_TRACT

## 2017-02-22 MED ORDER — LEVOFLOXACIN 750 MG PO TABS: 750.0000 mg | ORAL_TABLET | Freq: Every day | ORAL | 0 refills | Status: DC

## 2017-02-22 MED ORDER — LIDOCAINE HCL 2 % EX GEL: 1.0000 "application " | Freq: Once | CUTANEOUS | Status: DC

## 2017-02-22 MED ORDER — FENTANYL CITRATE (PF) 100 MCG/2ML IJ SOLN
INTRAMUSCULAR | Status: AC
  Filled 2017-02-22: qty 4

## 2017-02-22 MED ORDER — LIDOCAINE HCL 2 % EX GEL
CUTANEOUS | Status: DC | PRN
Start: 2017-02-22 — End: 2017-02-22
  Administered 2017-02-22: 1

## 2017-02-22 MED ORDER — BUTAMBEN-TETRACAINE-BENZOCAINE 2-2-14 % EX AERO: 1.0000 | INHALATION_SPRAY | Freq: Once | CUTANEOUS | Status: DC

## 2017-02-22 MED ORDER — FENTANYL CITRATE (PF) 100 MCG/2ML IJ SOLN
INTRAMUSCULAR | Status: DC | PRN
  Administered 2017-02-22 (×2): 25 ug via INTRAVENOUS
  Administered 2017-02-22: 50 ug via INTRAVENOUS

## 2017-02-22 MED ORDER — MIDAZOLAM HCL 5 MG/ML IJ SOLN
INTRAMUSCULAR | Status: AC
  Filled 2017-02-22: qty 2

## 2017-02-22 MED ORDER — LIDOCAINE HCL (PF) 1 % IJ SOLN: 5.0000 mL | Freq: Once | INTRAMUSCULAR | Status: DC

## 2017-02-22 MED ORDER — PHENYLEPHRINE HCL 0.25 % NA SOLN
1.0000 | Freq: Four times a day (QID) | NASAL | Status: DC | PRN
  Filled 2017-02-22: qty 15

## 2017-02-22 MED ORDER — SODIUM CHLORIDE 0.9 % IV SOLN
INTRAVENOUS | Status: DC
  Administered 2017-02-22: 09:00:00 via INTRAVENOUS

## 2017-02-22 MED ORDER — MIDAZOLAM HCL 10 MG/2ML IJ SOLN
INTRAMUSCULAR | Status: DC | PRN
  Administered 2017-02-22 (×2): 1 mg via INTRAVENOUS
  Administered 2017-02-22: 2 mg via INTRAVENOUS

## 2017-02-22 MED ORDER — PHENYLEPHRINE HCL 0.25 % NA SOLN
NASAL | Status: DC | PRN
  Administered 2017-02-22: 2

## 2017-02-22 NOTE — Op Note (Signed)
[facility] Cardiopulmonary Patient Name: Jeffrey Hines Pocedure Date: 02/22/2017 MRN: [id number] Attending MD: Juanito Doom , MD Date of Birth: 03-16-1946 CSN: Finalized Age: 71 Admit Type: Outpatient Gender: Male Procedure:            Bronchoscopy Indications:          Left mainstem mass, Abnormal CT scan of chest Providers:            Nathaneil Canary B. Lake Bells, MD, Andre Lefort RRT,RCP, Cherre Huger RRT, RCP Referring MD:          Medicines:            Midazolam 4 mg IV, Fentanyl 100 mcg IV, Lidocaine                        applied to nares and subglottic space, Lidocaine 1%                        subglottic space 6 mL Complications:        No immediate complications Estimated Blood Loss: Estimated blood loss: none. Procedure:            Pre-Anesthesia Assessment:                       - A History and Physical has been performed. Patient                        meds and allergies have been reviewed. The risks and                        benefits of the procedure and the sedation options and                        risks were discussed with the patient. All questions                        were answered and informed consent was obtained.                        Patient identification and proposed procedure were                        verified prior to the procedure by the physician and                        the technician in the procedure room. Mental Status                        Examination: alert and oriented. Airway Examination:                        normal oropharyngeal airway. Respiratory Examination:                        poor air movement in the left lung. CV Examination:                        normal. ASA Grade Assessment: II - A  patient with mild                        systemic disease. After reviewing the risks and                        benefits, the patient was deemed in satisfactory                        condition to undergo  the procedure. The anesthesia plan                        was to use moderate sedation / analgesia (conscious                        sedation). Immediately prior to administration of                        medications, the patient was re-assessed for adequacy                        to receive sedatives. The heart rate, respiratory rate,                        oxygen saturations, blood pressure, adequacy of                        pulmonary ventilation, and response to care were                        monitored throughout the procedure. The physical status                        of the patient was re-assessed after the procedure.                       After obtaining informed consent, the bronchoscope was                        passed under direct vision. Throughout the procedure,                        the patient's blood pressure, pulse, and oxygen                        saturations were monitored continuously. the [id number]                        [id number] scope was introduced through the right nostril                        and advanced to the tracheobronchial tree of both                        lungs. The procedure was accomplished without                        difficulty. The patient tolerated the procedure fairly                        well. The  total duration of the procedure was 20                        minutes. Scope In: 8:54:30 AM Scope Out: 9:12:31 AM Findings:      The nasopharynx/oropharynx appears normal. The larynx appears normal.       The vocal cords appear normal. The subglottic space is normal. The       trachea is of normal caliber. The carina is sharp. The tracheobronchial       tree of the right lung was examined to at least the first subsegmental       level. Bronchial mucosa and anatomy in the right lung are normal; there       are no endobronchial lesions, and no secretions.      Left Lung Abnormalities: A nearly obstructing (greater than 90%       obstructed) mass  was found distally in the left mainstem bronchus. The       mass was large and endobronchial and raised. The lesion was successfully       traversed after the intervention.      Left Lung Abnormalities: Tenacious, thick secretions were found in the       left mainstem bronchus. They were completely obstructing the airway.      Brushings of a mass were obtained in the left mainstem bronchus with a       cytology brush and sent for routine cytology and bacterial, AFB and       fungal analysis. Three samples were obtained.      Endobronchial biopsies of a mass were performed in the left mainstem       bronchus using a forceps and sent for histopathology examination. Three       samples were obtained.      Washings were obtained in the left mainstem bronchus and sent for       bacterial, AFB and fungal analysis. The return was blood-tinged and       cloudy. Impression:           - Left mainstem mass                       - Abnormal CT scan of chest                       - The right lung was normal.                       - An endobronchial and raised mass was found in the                        left mainstem bronchus. This lesion is likely malignant.                       - Tenacious, thick secretions were found in the left                        mainstem bronchus.                       - Brushings were obtained.                       - An endobronchial biopsy was performed. Moderate Sedation:  Moderate (conscious) sedation was personally administered by the       endoscopist. The following parameters were monitored: oxygen saturation,       heart rate, blood pressure, and response to care. Total physician       intraservice time was 25 minutes. Recommendation:       - Await biopsy and brushing results. Procedure Code(s):    --- Professional ---                       [phone], Bronchoscopy, rigid or flexible, including                        fluoroscopic guidance, when performed; with  bronchial                        or endobronchial biopsy(s), single or multiple sites                       [id number], Bronchoscopy, rigid or flexible, including                        fluoroscopic guidance, when performed; with brushing or                        protected brushings Diagnosis Code(s):    --- Professional ---                       R91.8, Other nonspecific abnormal finding of lung field                       J98.9, Respiratory disorder, unspecified                       R09.89, Other specified symptoms and signs involving                        the circulatory and respiratory systems                       R93.8, Abnormal findings on diagnostic imaging of other                        specified body structures CPT copyright 2016 American Medical Association. All rights reserved. The codes documented in this report are preliminary and upon coder review may  be revised to meet current compliance requirements. Norlene Campbell, MD Juanito Doom, MD 02/22/2017 9:26:42 AM This report has been signed electronically. Number of Addenda: 0

## 2017-02-22 NOTE — Telephone Encounter (Signed)
Staff message from BQ:   A,  He needs an appointment with [name] in the next week, NP OK. F/U pneumonia.  THanks,  B   ---------- lmtcb for pt.  Pt needs to be scheduled next week with NP.

## 2017-02-22 NOTE — Discharge Instructions (Signed)
Broncoscopia flexible: cuidados posteriores (Flexible Bronchoscopy, Care After) Estas indicaciones le proporcionan informacin general acerca de cmo deber cuidarse despus del procedimiento. El mdico tambin podr darle instrucciones especficas. Comunquese con el mdico si tiene algn problema o tiene preguntas despus del procedimiento. QU ESPERAR DESPUS DEL PROCEDIMIENTO Es normal tener ms tos o ronquera despus del procedimiento. CUIDADOS EN EL HOGAR  No coma ni beba nada durante las 2horas posteriores al procedimiento. Si trata de comer o beber antes de que desaparezca ese efecto, la comida o la bebida podran pasar a los pulmones. Tambin podra quemarse.  Despus de que hayan transcurrido 2horas y cuando pueda toser y sienta nuseas normalmente, podr comer alimentos blandos y beber lquido lentamente.  El da despus del estudio, podr retomar su dieta habitual.  Podr hacer sus actividades habituales.  Cumpla con los controles mdicos segn las indicaciones.  SOLICITE AYUDA DE INMEDIATO SI:  La dificultad para respirar es [place].  Se siente mareado.  Siente como si se fuera a desmayar.  Siente dolor en el pecho.  Experimenta problemas nuevos que lo preocupan.  Escupe ms que una pequea cantidad de sangre al toser.  Escupe ms sangre que antes al toser.  ASEGRESE DE QUE:  Comprende estas instrucciones.  Controlar su afeccin.  Recibir ayuda de inmediato si no mejora o si empeora.  Esta informacin no tiene Marine scientist el consejo del mdico. Asegrese de hacerle al mdico cualquier pregunta que tenga. Document Released: 06/25/2010 Document Revised: 03/13/2013 Document Reviewed: 01/25/2013 Elsevier Interactive Patient Education  2017 [place].  Nothing to eat or drink until   11:00 am today 02/22/2017.
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
## 2017-02-22 NOTE — Progress Notes (Signed)
Video Bronchoscopy done  Intervention Bronchial washings done Intervention Bronchial biopsy done Intervention bronchial brushings done  Procedure tolerated well

## 2017-02-23 ENCOUNTER — Encounter (HOSPITAL_COMMUNITY): Payer: Self-pay | Admitting: Pulmonary Disease

## 2017-02-23 ENCOUNTER — Ambulatory Visit: Payer: Self-pay | Admitting: Pulmonary Disease

## 2017-02-23 LAB — ACID FAST SMEAR (AFB, MYCOBACTERIA): Acid Fast Smear: NEGATIVE

## 2017-02-23 LAB — ACID FAST SMEAR (AFB): ACID FAST SMEAR - AFSCU2: NEGATIVE

## 2017-02-23 NOTE — Telephone Encounter (Signed)
Patient do not want to be scheduled with NP. Patient want to be scheduled with BQ because he have some serious stuff going on.

## 2017-02-23 NOTE — Telephone Encounter (Signed)
I have made pt aware of BQ's recommendations. Pt states he would prefer to see BQ to review bx results.  BQ does not have any availability next week.  BQ please advise if okay to DB? Thanks.

## 2017-02-23 NOTE — Telephone Encounter (Signed)
I called him to go over the results of his biopsy.  He says he still feels pretty badly.  I advised that he go to the hospital if he gets worse.  Otherwise I'm OK with double booking him on Monday morning, preferred last visit of the day

## 2017-02-24 LAB — CULTURE, RESPIRATORY: SPECIAL REQUESTS: NORMAL

## 2017-02-24 LAB — CULTURE, RESPIRATORY W GRAM STAIN
Culture: NO GROWTH
Special Requests: NORMAL

## 2017-02-24 NOTE — Telephone Encounter (Signed)
Jeffrey Hines can you double book pt to see BQ. I do not have access to do this. Thanks.

## 2017-02-24 NOTE — Telephone Encounter (Signed)
appt scheduled.  Pt aware. Nothing further needed.

## 2017-02-27 ENCOUNTER — Ambulatory Visit (INDEPENDENT_AMBULATORY_CARE_PROVIDER_SITE_OTHER): Payer: Medicare Other | Admitting: Pulmonary Disease

## 2017-02-27 ENCOUNTER — Encounter: Payer: Self-pay | Admitting: Pulmonary Disease

## 2017-02-27 VITALS — BP 112/58 | HR 94 | Temp 97.8°F | Ht 71.0 in | Wt 175.0 lb

## 2017-02-27 DIAGNOSIS — J9809 Other diseases of bronchus, not elsewhere classified: Secondary | ICD-10-CM | POA: Diagnosis not present

## 2017-02-27 DIAGNOSIS — J449 Chronic obstructive pulmonary disease, unspecified: Secondary | ICD-10-CM

## 2017-02-27 DIAGNOSIS — J4489 Other specified chronic obstructive pulmonary disease: Secondary | ICD-10-CM

## 2017-02-27 DIAGNOSIS — C3490 Malignant neoplasm of unspecified part of unspecified bronchus or lung: Secondary | ICD-10-CM

## 2017-02-27 MED ORDER — LEVOFLOXACIN 750 MG PO TABS: 750.0000 mg | ORAL_TABLET | Freq: Every day | ORAL | 0 refills | Status: AC

## 2017-02-27 NOTE — Patient Instructions (Addendum)
For stenosis of your left lower lobe with recurrent pulmonary infections: Take another week of Levaquin 750 mg daily for a total of 14 days of antibiotics I am going to refer you to the interventional pulmonary clinic at [facility] for evaluation of airway dilation  COPD: Continue Incruise and Symbicort  For your history of lung cancer: Continue follow-up with oncology  For constipation: Take a probiotic  We will see you back in 4-6 weeks or sooner if needed
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
## 2017-03-01 ENCOUNTER — Telehealth: Payer: Self-pay | Admitting: Pulmonary Disease

## 2017-03-01 NOTE — Telephone Encounter (Signed)
Spoke with pt, states he has concerns about his appt with [facility] on Friday.  I answered questions to the best of my ability, no further questions at this time.  Will close encounter.

## 2017-03-03 DIAGNOSIS — J432 Centrilobular emphysema: Secondary | ICD-10-CM | POA: Diagnosis not present

## 2017-03-03 DIAGNOSIS — C3492 Malignant neoplasm of unspecified part of left bronchus or lung: Secondary | ICD-10-CM | POA: Diagnosis not present

## 2017-03-03 DIAGNOSIS — J189 Pneumonia, unspecified organism: Secondary | ICD-10-CM | POA: Diagnosis not present

## 2017-03-03 DIAGNOSIS — J9809 Other diseases of bronchus, not elsewhere classified: Secondary | ICD-10-CM | POA: Diagnosis not present

## 2017-03-13 ENCOUNTER — Telehealth: Payer: Self-pay | Admitting: Pulmonary Disease

## 2017-03-13 MED ORDER — LEVOFLOXACIN 750 MG PO TABS: 750.0000 mg | ORAL_TABLET | Freq: Every day | ORAL | 0 refills | Status: DC

## 2017-03-13 NOTE — Telephone Encounter (Signed)
Spoke with patient. He is aware of BQ's recs. Levaquin has been called into pharmacy. Patient already has an appt with TP scheduled for later on this month.   Nothing else needed at time of call.

## 2017-03-13 NOTE — Telephone Encounter (Signed)
He needs that procedure at [facility], he's not going to get better unless they do it. Restart levaquin 750, 7 days Get OV

## 2017-03-13 NOTE — Telephone Encounter (Signed)
Spoke with pt, who states he completed Levaquin 750 on Friday that BQ prescribed during 02/27/17. Pt states he Saturday he developed temp of 99-101, prod cough with green mucus, body aches & increased fatigue.  Pt states he is scheduled for procedure this week and doesn't feel up to it.  BQ please advise. Thanks.

## 2017-03-16 DIAGNOSIS — I2510 Atherosclerotic heart disease of native coronary artery without angina pectoris: Secondary | ICD-10-CM | POA: Diagnosis not present

## 2017-03-16 DIAGNOSIS — J984 Other disorders of lung: Secondary | ICD-10-CM | POA: Diagnosis not present

## 2017-03-16 DIAGNOSIS — Z87891 Personal history of nicotine dependence: Secondary | ICD-10-CM | POA: Diagnosis not present

## 2017-03-16 DIAGNOSIS — J9809 Other diseases of bronchus, not elsewhere classified: Secondary | ICD-10-CM | POA: Diagnosis not present

## 2017-03-16 DIAGNOSIS — J449 Chronic obstructive pulmonary disease, unspecified: Secondary | ICD-10-CM | POA: Diagnosis not present

## 2017-03-16 DIAGNOSIS — K5730 Diverticulosis of large intestine without perforation or abscess without bleeding: Secondary | ICD-10-CM | POA: Diagnosis not present

## 2017-03-16 DIAGNOSIS — Z902 Acquired absence of lung [part of]: Secondary | ICD-10-CM | POA: Diagnosis not present

## 2017-03-16 DIAGNOSIS — Z79899 Other long term (current) drug therapy: Secondary | ICD-10-CM | POA: Diagnosis not present

## 2017-03-16 DIAGNOSIS — Z9221 Personal history of antineoplastic chemotherapy: Secondary | ICD-10-CM | POA: Diagnosis not present

## 2017-03-16 DIAGNOSIS — I10 Essential (primary) hypertension: Secondary | ICD-10-CM | POA: Diagnosis not present

## 2017-03-16 DIAGNOSIS — J440 Chronic obstructive pulmonary disease with acute lower respiratory infection: Secondary | ICD-10-CM | POA: Diagnosis not present

## 2017-03-16 DIAGNOSIS — R9389 Abnormal findings on diagnostic imaging of other specified body structures: Secondary | ICD-10-CM | POA: Diagnosis not present

## 2017-03-16 DIAGNOSIS — D649 Anemia, unspecified: Secondary | ICD-10-CM | POA: Diagnosis not present

## 2017-03-16 DIAGNOSIS — K219 Gastro-esophageal reflux disease without esophagitis: Secondary | ICD-10-CM | POA: Diagnosis not present

## 2017-03-16 DIAGNOSIS — C3492 Malignant neoplasm of unspecified part of left bronchus or lung: Secondary | ICD-10-CM | POA: Diagnosis not present

## 2017-03-16 DIAGNOSIS — Z9981 Dependence on supplemental oxygen: Secondary | ICD-10-CM | POA: Diagnosis not present

## 2017-03-16 DIAGNOSIS — R0989 Other specified symptoms and signs involving the circulatory and respiratory systems: Secondary | ICD-10-CM | POA: Diagnosis not present

## 2017-03-16 DIAGNOSIS — Z85118 Personal history of other malignant neoplasm of bronchus and lung: Secondary | ICD-10-CM | POA: Diagnosis not present

## 2017-03-16 DIAGNOSIS — J441 Chronic obstructive pulmonary disease with (acute) exacerbation: Secondary | ICD-10-CM | POA: Diagnosis not present

## 2017-03-16 DIAGNOSIS — Z9889 Other specified postprocedural states: Secondary | ICD-10-CM | POA: Diagnosis not present

## 2017-03-16 DIAGNOSIS — Z923 Personal history of irradiation: Secondary | ICD-10-CM | POA: Diagnosis not present

## 2017-03-20 ENCOUNTER — Encounter: Payer: Self-pay | Admitting: Pulmonary Disease

## 2017-03-20 ENCOUNTER — Ambulatory Visit (INDEPENDENT_AMBULATORY_CARE_PROVIDER_SITE_OTHER): Payer: Medicare Other | Admitting: Pulmonary Disease

## 2017-03-20 VITALS — BP 116/70 | HR 90 | Ht 70.0 in | Wt 174.1 lb

## 2017-03-20 DIAGNOSIS — R059 Cough, unspecified: Secondary | ICD-10-CM

## 2017-03-20 DIAGNOSIS — C3492 Malignant neoplasm of unspecified part of left bronchus or lung: Secondary | ICD-10-CM

## 2017-03-20 DIAGNOSIS — R05 Cough: Secondary | ICD-10-CM

## 2017-03-20 DIAGNOSIS — J449 Chronic obstructive pulmonary disease, unspecified: Secondary | ICD-10-CM

## 2017-03-20 MED ORDER — DOXYCYCLINE HYCLATE 100 MG PO TABS: 100.0000 mg | ORAL_TABLET | Freq: Two times a day (BID) | ORAL | 0 refills | Status: DC

## 2017-03-20 MED ORDER — FLUTICASONE-UMECLIDIN-VILANT 100-62.5-25 MCG/INH IN AEPB: 1.0000 | INHALATION_SPRAY | Freq: Every day | RESPIRATORY_TRACT | 0 refills | Status: DC

## 2017-03-20 MED ORDER — FLUTTER DEVI: 1.0000 | 0 refills | Status: AC
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
## 2017-03-20 NOTE — Patient Instructions (Signed)
   Use the Trelegy inhaler in place of your Symbicort & Spiriva. If you feel your breathing is worse you can go back to the Symbicort & Spiriva.  Remember to remove any dentures or partials you have before you use your inhaler. Remember to brush your teeth & tongue after you use your inhaler as well as rinse, gargle & spit to keep from getting thrush in your mouth or on your tongue (a white film).   Use the Flutter Valve by blowing into it 3 times in a row twice daily.  Remember not to expose yourself to excessive sunlight while on the Doxycycline & take with a full glass of water remaining upright afterward for 1 hour. Also avoid taking this with any dairy products.  Your sputum specimen should not be refrigerated and should be dropped off at the lab within 4 hours. Early morning specimens are the best.   Call us if you have any new breathing problems or questions before your appointment next week.   TESTS ORDERED: 1. Sputum AFB, Fungus, & Bacteria.

## 2017-03-22 ENCOUNTER — Telehealth: Payer: Self-pay

## 2017-03-22 NOTE — Telephone Encounter (Signed)
-----   Message from Juanito Doom, MD sent at 03/22/2017  7:46 AM EDT ----- split ----- Message ----- From: Len Blalock, CMA Sent: 03/21/2017   9:26 AM To: Juanito Doom, MD  Do you want to order a poly, split, or home sleep test?  Thanks!  ----- Message ----- From: Juanito Doom, MD Sent: 03/21/2017   9:00 AM To: Len Blalock, CMA  Hi,  [facility] wants [name] to try to get him CPAP.  Can we order a sleep study for fatigue, sleepiness?  Thanks, Ruby Cola

## 2017-03-22 NOTE — Telephone Encounter (Signed)
lmtcb X1 for pt- will order sleep study after speaking to pt.

## 2017-03-23 LAB — FUNGAL ORGANISM REFLEX

## 2017-03-23 LAB — FUNGUS CULTURE WITH STAIN

## 2017-03-23 LAB — FUNGUS CULTURE RESULT

## 2017-03-28 LAB — FUNGAL ORGANISM REFLEX

## 2017-03-28 LAB — FUNGUS CULTURE RESULT

## 2017-03-28 LAB — FUNGUS CULTURE WITH STAIN

## 2017-03-29 ENCOUNTER — Ambulatory Visit: Payer: Self-pay | Admitting: Pulmonary Disease

## 2017-03-30 ENCOUNTER — Encounter: Payer: Self-pay | Admitting: Pulmonary Disease

## 2017-03-30 ENCOUNTER — Ambulatory Visit (INDEPENDENT_AMBULATORY_CARE_PROVIDER_SITE_OTHER)
Admission: RE | Admit: 2017-03-30 | Discharge: 2017-03-30 | Disposition: A | Discharge status: Home / Self Care | Payer: Medicare Other | Source: Ambulatory Visit | Attending: Pulmonary Disease | Admitting: Pulmonary Disease

## 2017-03-30 ENCOUNTER — Ambulatory Visit (INDEPENDENT_AMBULATORY_CARE_PROVIDER_SITE_OTHER): Payer: Medicare Other | Admitting: Pulmonary Disease

## 2017-03-30 ENCOUNTER — Ambulatory Visit: Payer: Self-pay | Admitting: Adult Health

## 2017-03-30 VITALS — BP 112/74 | HR 87 | Ht 71.0 in | Wt 178.6 lb

## 2017-03-30 DIAGNOSIS — J181 Lobar pneumonia, unspecified organism: Secondary | ICD-10-CM | POA: Diagnosis not present

## 2017-03-30 DIAGNOSIS — J9809 Other diseases of bronchus, not elsewhere classified: Secondary | ICD-10-CM | POA: Diagnosis not present

## 2017-03-30 DIAGNOSIS — J449 Chronic obstructive pulmonary disease, unspecified: Secondary | ICD-10-CM

## 2017-03-30 MED ORDER — FLUTICASONE-UMECLIDIN-VILANT 100-62.5-25 MCG/INH IN AEPB: 1.0000 | INHALATION_SPRAY | Freq: Every day | RESPIRATORY_TRACT | 12 refills | Status: DC

## 2017-03-30 MED ORDER — FLUTICASONE-UMECLIDIN-VILANT 100-62.5-25 MCG/INH IN AEPB: 1.0000 | INHALATION_SPRAY | Freq: Every day | RESPIRATORY_TRACT | 6 refills | Status: DC

## 2017-03-30 NOTE — Addendum Note (Signed)
Addended by: Lorretta Harp on: 03/30/2017 03:29 PM   Modules accepted: Orders

## 2017-03-30 NOTE — Patient Instructions (Signed)
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

## 2017-03-30 NOTE — Addendum Note (Signed)
Addended by: Georjean Mode on: 03/30/2017 03:22 PM   Modules accepted: Orders

## 2017-03-30 NOTE — Addendum Note (Signed)
Addended by: Georjean Mode on: 03/30/2017 03:26 PM   Modules accepted: Orders

## 2017-03-30 NOTE — Addendum Note (Signed)
Addended by: Georjean Mode on: 03/30/2017 03:28 PM   Modules accepted: Orders

## 2017-03-30 NOTE — Progress Notes (Signed)
Subjective:    Patient ID: Jeffrey Hines, male    DOB: 1945-10-04, 71 y.o.   MRN: [id number]  Synopsis: Mr. Jeffrey Hines was diagnosed with squamous cell carcinoma of the left upper lung in 2016. He was hospitalized for Pseudomonas pneumonia at the same time. He started treatment with chemotherapy and radiation therapy in 2016. He also has COPD.  After chemo/radiation treatment of his left upper lobe malignancy he had a left upper lobectomy in the summer 2016. In the summer of 2018 he developed recurrent respiratory infections predominantly from the left lung. A bronchoscopy on 02/22/2017 was performed and showed tight stenosis in the left lower lobe. Biopsies were taken and were negative for malignancy.  He later went to [facility] and on March 16, 2017 he had a bronchoscopy with balloon dilation of a focal area of acquired bronchomalacia and stenosis of the left lower lobe orifice.  This is felt to be due to torsion of the lung post lobectomy.   HPI Chief Complaint  Patient presents with  . Follow-up    Pt doing better on current antibotic. Here to discuss procedure   [name] says that doxycycline really made a difference.  He thinks that the side effects of the levaquin was really bothering him, so when he changed to doxycycline he felt a lot better.  He had his procedure at [facility] and had a balloon dilation of the left lower lobe orifice.  He was also put on a flutter valve.  He is not currently coughing up more mucus, it has actually improved.  His breathing has improved a little.  He doesn't feel as wheezy.  He hasn't had any fever or chills.  He has a headache from time to time.  No more achyness.     Past Medical History:  Diagnosis Date  . Anxiety   . Cancer ([place])   . Chronic lower back pain   . Closed head injury 1998  . Constipation due to opioid therapy   . COPD (chronic obstructive pulmonary disease) ([place])   . COPD with chronic bronchitis ([place])   . Full dentures   . GERD  (gastroesophageal reflux disease)   . Hemoptysis 08/03/2014  . Hypertension   . Multiple rib fractures 1998   left side  . On supplemental oxygen therapy    2L of O2 at night  . Post-obstruction pneumonia due to Pseudomonas aeruginosa 08/07/2014   Endobronchial mass causing LUL obstruction  . Radiation 08/25/14-09/29/14   left upper central lung 45 Gy  . Seizures ([place])   . Shortness of breath dyspnea   . Shoulder dislocation 1998   left  . Spontaneous pneumothorax 08/03/2014   Left side 1st time episode spontaneous pneumothorax associated with acute flare of COPD   . Tobacco abuse        Review of Systems  Constitutional: Negative for chills, fatigue and fever.  HENT: Negative for postnasal drip, rhinorrhea and sinus pressure.   Respiratory: Positive for shortness of breath and wheezing. Negative for cough.   Cardiovascular: Negative for chest pain, palpitations and leg swelling.       Objective:   Physical Exam  Vitals:   03/30/17 1449  BP: 112/74  Pulse: 87  SpO2: 94%  Weight: 178 lb 9.6 oz (81 kg)  Height: _0  (1.803 m)   RA   Gen: well appearing HENT: OP clear, TM's clear, neck supple PULM: improved air movement in the left lung B, normal percussion CV: RRR, no mgr, trace  edema GI: BS+, soft, nontender Derm: no cyanosis or rash Psyche: normal mood and affect     PFT: March 2016 pulmonary function test> ratio 50%, FEV1 L (49% predicted, 18% change with bronchodilator), total lung capacity 6.73 L (93% predicted), DLCO 11.13 (33% predicted). June 2016 pulmonary function testing ratio 46%, FEV1 2.47 L (71% Pred), total lung capacity 7.79 L (107% predicted), DLCO 10.62 (31% predicted). March 2018 simple spirometry test: Ratio 42%, FEV1 1.16 L 34% predicted, FVC 2.76 60% percent predicted  11/2014 ONO < 88% 2 hours 08/31/2016> Walked 500 feet on room air O2 saturation ranged between 91 and 94%    Micro: 03/16/2017 bronch wash/BAL> oropharyngeal  fluid 02/22/2017 BAL bact/fung/AFB> all negative  CBC    Component Value Date/Time   WBC 8.4 02/16/2017 1434   RBC 4.62 02/16/2017 1434   HGB 13.1 02/16/2017 1434   HGB 12.9 (L) 02/08/2017 1343   HCT 39.0 02/16/2017 1434   HCT 38.9 02/08/2017 1343   PLT 339.0 02/16/2017 1434   PLT 218 02/08/2017 1343   MCV 84.3 02/16/2017 1434   MCV 85.7 02/08/2017 1343   MCH 28.4 02/08/2017 1343   MCH 30.3 12/20/2014 0443   MCHC 33.7 02/16/2017 1434   RDW 13.2 02/16/2017 1434   RDW 12.8 02/08/2017 1343   LYMPHSABS 0.8 02/16/2017 1434   LYMPHSABS 0.9 02/08/2017 1343   MONOABS 0.9 02/16/2017 1434   MONOABS 0.5 02/08/2017 1343   EOSABS 0.1 02/16/2017 1434   EOSABS 0.1 02/08/2017 1343   BASOSABS 0.0 02/16/2017 1434   BASOSABS 0.0 02/08/2017 1343   Chest imaging:  March 2018 CT chest: Status post left upper lobectomy, some mild centrilobular emphysema, mild fibrotic change right lower lobe, bronchovascular distribution question aspiration, images independently reviewed September 2018 CT chest images independently reviewed showing that he is status post left lower lobectomy, there is significant nodular consolidation and tree-in-bud abnormalities in the left lung, some mucus plugging.  Pathology report: September 2018 left mainstem bronchus biopsy: Negative for malignancy, fibrotic changes noted, cytology from brushing negative  Microbiology: September 19 wash and brushing: Multiple species present, none predominant , fungus and AFB pending     Assessment & Plan:   Lobar pneumonia, unspecified organism ([place]) - Plan: DG Chest 2 View  Bronchomalacia, acquired - Plan: Ambulatory Referral for DME  COPD, severe ([place])  Discussion: Jeffrey Hines seems to be doing better finally after a lengthy course of antibiotics and balloon dilation of the lower lobe orifice at [facility] a couple weeks ago.  He has acquired bronchomalacia of the left lower lobe after a left upper lobectomy and radiation  treatment for squamous cell carcinoma.  Based on the location and appearance of the airway the physicians at [facility] felt that he would not benefit from mechanical stent.  However we would like to provide pneumatic stenting with CPAP to help prevent recurrent episodes of pneumonia.  I want to watch him very closely over the next several weeks.  I will have him come back and see [name] next week, if he has any suggestion of recurrent infection (lethargy fever cough etc.) then will put him back on doxycycline.  If his insurance company balks at the idea of giving CPAP for acquired bronchomalacia, then we can consider having him purchase a machine online.  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  albuterol (PROVENTIL) (2.5 MG/3ML) 0.083% nebulizer solution, Take 3 mLs (2.5 mg total) by nebulization every 6 (six) hours as needed for wheezing or shortness of breath., Disp: 75 mL, Rfl: 12 .  Bioflavonoid Products (BIOFLEX PO), Take 1 tablet by mouth 2 (two) times daily. , Disp: , Rfl:  .  clonazePAM (KLONOPIN) 0.5 MG tablet, Take 0.5 mg by mouth 3 (three) times daily., Disp: , Rfl:  .  doxycycline (VIBRA-TABS) 100 MG tablet, Take 1 tablet (100 mg total) by mouth 2 (two) times daily., Disp: 20 tablet, Rfl: 0 .   Fluticasone-Umeclidin-Vilant (TRELEGY ELLIPTA) 100-62.5-25 MCG/INH AEPB, Inhale 1 puff into the lungs daily., Disp: 1 each, Rfl: 0 .  ibuprofen (ADVIL,MOTRIN) 200 MG tablet, Take 400 mg by mouth 2 (two) times daily. Reported on 08/03/2015, Disp: , Rfl:  .  levofloxacin (LEVAQUIN) 750 MG tablet, Take 1 tablet (750 mg total) by mouth daily., Disp: 7 tablet, Rfl: 0 .  loratadine (CLARITIN) 10 MG tablet, Take 10 mg by mouth daily., Disp: , Rfl:  .  PROVENTIL HFA 108 (90 BASE) MCG/ACT inhaler, , Disp: , Rfl:  .  Respiratory Therapy Supplies (FLUTTER) DEVI, 1 Device by Does not apply route as directed., Disp: 1 each, Rfl: 0 .  SYMBICORT 160-4.5 MCG/ACT inhaler, Inhale 2 puffs into the lungs 2 (two) times daily., Disp: , Rfl:  .  umeclidinium bromide (INCRUSE ELLIPTA) 62.5 MCG/INH AEPB, Inhale 1 puff into the lungs daily., Disp: 30 each, Rfl: 11 .  amLODipine (NORVASC) 10 MG tablet, Take 1 tablet (10 mg total) by mouth daily. (Patient not taking: Reported on 03/30/2017), Disp: 30 tablet, Rfl: 1

## 2017-04-05 ENCOUNTER — Other Ambulatory Visit: Payer: Self-pay

## 2017-04-05 DIAGNOSIS — J9809 Other diseases of bronchus, not elsewhere classified: Secondary | ICD-10-CM

## 2017-04-06 ENCOUNTER — Encounter: Payer: Self-pay | Admitting: Adult Health

## 2017-04-06 ENCOUNTER — Ambulatory Visit (INDEPENDENT_AMBULATORY_CARE_PROVIDER_SITE_OTHER): Payer: Medicare Other | Admitting: Adult Health

## 2017-04-06 DIAGNOSIS — J449 Chronic obstructive pulmonary disease, unspecified: Secondary | ICD-10-CM | POA: Diagnosis not present

## 2017-04-06 DIAGNOSIS — R918 Other nonspecific abnormal finding of lung field: Secondary | ICD-10-CM | POA: Diagnosis not present

## 2017-04-06 DIAGNOSIS — C3490 Malignant neoplasm of unspecified part of unspecified bronchus or lung: Secondary | ICD-10-CM | POA: Diagnosis not present

## 2017-04-06 DIAGNOSIS — Z23 Encounter for immunization: Secondary | ICD-10-CM

## 2017-04-06 LAB — ACID FAST CULTURE WITH REFLEXED SENSITIVITIES (MYCOBACTERIA)
Acid Fast Culture: NEGATIVE
Acid Fast Culture: NEGATIVE

## 2017-04-06 LAB — ACID FAST CULTURE WITH REFLEXED SENSITIVITIES

## 2017-04-06 NOTE — Assessment & Plan Note (Signed)
Post obstructive changes on CT chest /CXR felt secondary to acquired bronchomalacia .  CPAP is pending  Hold on additional abx for now as pt is clinically improving   Plan  Patient Instructions  Continue on current regimen  Flu shot today .  Call back if insurance does not approve CPAP .  follow up with Dr. Lake Bells in 1 month and As needed

## 2017-04-06 NOTE — Assessment & Plan Note (Signed)
Stable without flare  Plan Patient Instructions  Continue on current regimen  Flu shot today .  Call back if insurance does not approve CPAP .  follow up with Dr. Lake Bells in 1 month and As needed

## 2017-04-06 NOTE — Assessment & Plan Note (Signed)
Recent bronchoscopy with negative cytology and pathology. Continue follow-up with oncology and serial CT chest

## 2017-04-06 NOTE — Progress Notes (Signed)
@Patient  ID: Jeffrey Hines, male    DOB: 06/08/1945, 71 y.o.   MRN: [id number]  Chief Complaint  Patient presents with  . Follow-up    PNA     Referring provider: [facility]  HPI: 71 year old male followed for COPD, squamous cell carcinoma of the left upper lung diagnosed in 2016, Pseudomonas pneumonia., Nocturnal Hypoxia on O2 At bedtime  .   TEST Joya San  -Pseudomonas pneumonia. 2016  -Squama cell carcinoma 2016 status post chemotherapy and radiation. -March 2016 pulmonary function test> ratio 50%, FEV1 L (49% predicted, 18% change with bronchodilator), total lung capacity 6.73 L (93% predicted), DLCO 11.13 (33% predicted). June 2016 pulmonary function testing ratio 46%, FEV1 2.47 L (71% Pred), total lung capacity 7.79 L (107% predicted), DLCO 10.62 (31% predicted). 11/2014 ONO < 88% 2 hours -March 2018 simple spirometry test: Ratio 42%, FEV1 1.16 L 34% predicted, FVC 2.7660% percent predicted -08/31/2016>Walked 500 feet on room air O2 saturation ranged between 91 and 94%  -March 2018 CT chest: Status post left upper lobectomy, some mild centrilobular emphysema, mild fibrotic change right lower lobe, bronchovascular distribution question aspiration, images independently reviewed -Records from the [place] facility reviewed were his sleep study showed restless leg syndrome, low oxygen when sleeping but no evidence of obstructive sleep apnea.  04/06/2017 Follow up : PNA  Thank you patient presents for 1 week follow-up.  Patient has a known history of COPD and squamous cell carcinoma of the left upper lobe status post chemo and radiation in 2016.  Serial CT follow-up February 09, 2017 showed occlusion of the left lower lobe bronchus by patchy material slightly worsened favor mucus plugging. consolidation of the left lower lobe compatible with a postobstructive pneumonia.  Patient underwent a bronchoscopy September 19 that showed a tight stenosis of the left upper lobe.  Biopsies  were negative for malignancy.  Patient was seen at [facility] and underwent a bronchoscopy on October 11 with a balloon dilation of the focal area of acquired bronchomalacia and stenosis of the left lower lobe orifice this was felt due to torsion of the lung post lobectomy.  Patient was treated with Levaquin for a postobstructive pneumonia.  Patient did not see any clinical improvement and felt that it was making him sick.  Patient was changed over to doxycycline.  Patient has been off antibiotics for 1 week.  Patient says he is feeling much improved.  He says this is  the best he has felt in 6 months.. Chest x-ray last week showed persistent left lower lobe airspace disease/postobstructive changes. Pt has been recommended to have CPAP . This is awaiting insurance approval . Patient denies any fever fatigue hemoptysis chest pain or discolored mucus.Marland Kitchen He remains on Symbicort and [place].  Appetite is improved..   Feels the best in 6 months    Allergies  Allergen Reactions  . Prolixin [Fluphenazine]     Severe back pain  . Advair Diskus [Fluticasone-Salmeterol] Anxiety    Immunization History  Administered Date(s) Administered  . Influenza Split 04/14/2015  . Influenza Whole 02/27/2014  . Influenza, High Dose Seasonal PF 06/02/2016, 04/06/2017  . Pneumococcal Conjugate-13 04/14/2015  . Pneumococcal Polysaccharide-23 05/03/2016    Past Medical History:  Diagnosis Date  . Anxiety   . Cancer ([place])   . Chronic lower back pain   . Closed head injury 1998  . Constipation due to opioid therapy   . COPD (chronic obstructive pulmonary disease) ([place])   . COPD with chronic bronchitis ([place])   .  Full dentures   . GERD (gastroesophageal reflux disease)   . Hemoptysis 08/03/2014  . Hypertension   . Multiple rib fractures 1998   left side  . On supplemental oxygen therapy    2L of O2 at night  . Post-obstruction pneumonia due to Pseudomonas aeruginosa 08/07/2014   Endobronchial mass  causing LUL obstruction  . Radiation 08/25/14-09/29/14   left upper central lung 45 Gy  . Seizures ([place])   . Shortness of breath dyspnea   . Shoulder dislocation 1998   left  . Spontaneous pneumothorax 08/03/2014   Left side 1st time episode spontaneous pneumothorax associated with acute flare of COPD   . Tobacco abuse     Tobacco History: History  Smoking Status  . Former Smoker  . Packs/day: 0.50  . Years: 50.00  . Types: Cigarettes  . Quit date: 08/03/2014  Smokeless Tobacco  . Never Used   Counseling given: Not Answered   Outpatient Encounter Prescriptions as of 04/06/2017  Medication Sig  . albuterol (PROVENTIL) (2.5 MG/3ML) 0.083% nebulizer solution Take 3 mLs (2.5 mg total) by nebulization every 6 (six) hours as needed for wheezing or shortness of breath.  Marland Kitchen Bioflavonoid Products (BIOFLEX PO) Take 1 tablet by mouth 2 (two) times daily.   . clonazePAM (KLONOPIN) 0.5 MG tablet Take 0.5 mg by mouth 3 (three) times daily.  Marland Kitchen ibuprofen (ADVIL,MOTRIN) 200 MG tablet Take 400 mg by mouth 2 (two) times daily. Reported on 08/03/2015  . loratadine (CLARITIN) 10 MG tablet Take 10 mg by mouth daily.  Marland Kitchen PROVENTIL HFA 108 (90 BASE) MCG/ACT inhaler   . Respiratory Therapy Supplies (FLUTTER) DEVI 1 Device by Does not apply route as directed.  . SYMBICORT 160-4.5 MCG/ACT inhaler Inhale 2 puffs into the lungs 2 (two) times daily.  Marland Kitchen umeclidinium bromide (INCRUSE ELLIPTA) 62.5 MCG/INH AEPB Inhale 1 puff into the lungs daily.  . [DISCONTINUED] amLODipine (NORVASC) 10 MG tablet Take 1 tablet (10 mg total) by mouth daily. (Patient not taking: Reported on 04/06/2017)  . [DISCONTINUED] doxycycline (VIBRA-TABS) 100 MG tablet Take 1 tablet (100 mg total) by mouth 2 (two) times daily. (Patient not taking: Reported on 04/06/2017)  . [DISCONTINUED] Fluticasone-Umeclidin-Vilant (TRELEGY ELLIPTA) 100-62.5-25 MCG/INH AEPB Inhale 1 Inhaler into the lungs daily at 6 (six) AM. (Patient not taking: Reported on  04/06/2017)  . [DISCONTINUED] levofloxacin (LEVAQUIN) 750 MG tablet Take 1 tablet (750 mg total) by mouth daily. (Patient not taking: Reported on 04/06/2017)   No facility-administered encounter medications on file as of 04/06/2017.      Review of Systems  Constitutional:   No  weight loss, night sweats,  Fevers, chills, fatigue, or  lassitude.  HEENT:   No headaches,  Difficulty swallowing,  Tooth/dental problems, or  Sore throat,                No sneezing, itching, ear ache, nasal congestion, post nasal drip,   CV:  No chest pain,  Orthopnea, PND, swelling in lower extremities, anasarca, dizziness, palpitations, syncope.   GI  No heartburn, indigestion, abdominal pain, nausea, vomiting, diarrhea, change in bowel habits, loss of appetite, bloody stools.   Resp:.  No chest wall deformity  Skin: no rash or lesions.  GU: no dysuria, change in color of urine, no urgency or frequency.  No flank pain, no hematuria   MS:  No joint pain or swelling.  No decreased range of motion.  No back pain.    Physical Exam  BP 126/78 (BP Location: Left  Arm, Cuff Size: Normal)   Pulse 93   Ht 5\' 11"  (1.803 m)   Wt 174 lb (78.9 kg)   SpO2 93%   BMI 24.27 kg/m   GEN: A/Ox3; pleasant , NAD, thin male   HEENT:  [place]/AT,  EACs-clear, TMs-wnl, NOSE-clear, THROAT-clear, no lesions, no postnasal drip   NECK:  Supple w/ fair ROM; no JVD; normal carotid impulses w/o bruits; no thyromegaly or nodules palpated; no lymphadenopathy.    RESP  decreased breath sounds in the bases , no accessory muscle use, no dullness to percussion  CARD:  RRR, no m/r/g, no peripheral edema, pulses intact, no cyanosis or clubbing.  GI:   Soft & nt; nml bowel sounds; no organomegaly or masses detected.   Musco: Warm bil, no deformities or joint swelling noted.   Neuro: alert, no focal deficits noted.    Skin: Warm, no lesions or rashes    Lab Results:   BMET  ProBNP No results found for: PROBNP  Imaging: Dg  Chest 2 View  Result Date: 03/30/2017 CLINICAL DATA:  Follow-up lobar pneumonia. EXAM: CHEST  2 VIEW COMPARISON:  Chest CT 02/08/2017 FINDINGS: Stable if not progressed airspace disease in the left lower lobe. Left lower lobe bronchus and multifocal segmental bronchial obstruction on prior CT. This is presumably primarily postobstructive change. Post treatment changes of resection and radiotherapy in the left apical lung with volume loss. Hyperinflated right lung is clear. Normal heart size. No change in mediastinal contours. IMPRESSION: Interval bronchoscopy and balloon dilatation of the stenotic left lower lobe bronchus. Postobstructive opacity/infection is persistent if not progressive. Electronically Signed   By: Monte Fantasia M.D.   On: 03/30/2017 16:16     Assessment & Plan:   COPD with chronic bronchitis Stable without flare  Plan Patient Instructions  Continue on current regimen  Flu shot today .  Call back if insurance does not approve CPAP .  follow up with Dr. Lake Bells in 1 month and As needed       Squamous cell carcinoma of lung, stage II Recent bronchoscopy with negative cytology and pathology. Continue follow-up with oncology and serial CT chest  Lung mass Post obstructive changes on CT chest /CXR felt secondary to acquired bronchomalacia .  CPAP is pending  Hold on additional abx for now as pt is clinically improving   Plan  Patient Instructions  Continue on current regimen  Flu shot today .  Call back if insurance does not approve CPAP .  follow up with Dr. Lake Bells in 1 month and As needed          Rexene Edison, NP 04/06/2017

## 2017-04-06 NOTE — Patient Instructions (Signed)
Continue on current regimen  Flu shot today .  Call back if insurance does not approve CPAP .  follow up with Dr. Lake Bells in 1 month and As needed

## 2017-04-07 NOTE — Progress Notes (Signed)
Reviewed, agree 

## 2017-04-26 ENCOUNTER — Telehealth: Payer: Self-pay | Admitting: Pulmonary Disease

## 2017-04-26 MED ORDER — DOXYCYCLINE HYCLATE 100 MG PO TABS: 100.0000 mg | ORAL_TABLET | Freq: Two times a day (BID) | ORAL | 0 refills | Status: DC

## 2017-04-26 NOTE — Telephone Encounter (Signed)
Spoke with pt, he states he is running a fever of 101.5, denies cough, body aches, headache, and body aches. He is very weak and no appetite. Pain when he breathes in deep. He took Ibuprofen and claritin last night. He is requesting Doxycycline because it helped him last time when he had symptoms. Please advise.    [facility]

## 2017-04-26 NOTE — Telephone Encounter (Signed)
Pt is aware of MW recommendations and voiced his understanding.  Pt states this is recurrent pain.  Rx has been sent to preferred pharmacy.  Pt has been scheduled with MW on 05/05/17. Nothing further needed.

## 2017-04-26 NOTE — Telephone Encounter (Signed)
If it's pain on breathing in the same area as before, ok to try doxy 100 mg bid x 10days but if new pain or not getting better on doxy needs to go to UC or ER  - [facility] need f/u by 11/30 in office with whoever has an opening as need to address why it keeps happening

## 2017-05-05 ENCOUNTER — Encounter: Payer: Self-pay | Admitting: Internal Medicine

## 2017-05-05 ENCOUNTER — Ambulatory Visit (INDEPENDENT_AMBULATORY_CARE_PROVIDER_SITE_OTHER): Payer: Medicare Other | Admitting: Internal Medicine

## 2017-05-05 ENCOUNTER — Ambulatory Visit (INDEPENDENT_AMBULATORY_CARE_PROVIDER_SITE_OTHER)
Admission: RE | Admit: 2017-05-05 | Discharge: 2017-05-05 | Disposition: A | Discharge status: Home / Self Care | Payer: Medicare Other | Source: Ambulatory Visit | Attending: Internal Medicine | Admitting: Internal Medicine

## 2017-05-05 VITALS — BP 110/74 | HR 96 | Temp 97.2°F | Ht 70.0 in | Wt 174.0 lb

## 2017-05-05 DIAGNOSIS — J449 Chronic obstructive pulmonary disease, unspecified: Secondary | ICD-10-CM | POA: Diagnosis not present

## 2017-05-05 DIAGNOSIS — C3492 Malignant neoplasm of unspecified part of left bronchus or lung: Secondary | ICD-10-CM | POA: Diagnosis not present

## 2017-05-05 DIAGNOSIS — C3490 Malignant neoplasm of unspecified part of unspecified bronchus or lung: Secondary | ICD-10-CM | POA: Diagnosis not present

## 2017-05-05 MED ORDER — DOXYCYCLINE HYCLATE 100 MG PO TABS: 100.0000 mg | ORAL_TABLET | Freq: Two times a day (BID) | ORAL | 5 refills | Status: DC

## 2017-05-05 NOTE — Patient Instructions (Addendum)
For recurrent pain on breating in the same area or nasty mucus production > doxy 100 mg twice daily x 10 days   Please remember to go to the  x-ray department downstairs in the basement  for your tests - we will call you with the results when they are available.      Change the follow up office visit to see Dr Lake Bells in 6 weeks - call sooner if needed

## 2017-05-05 NOTE — Progress Notes (Signed)
@Patient  ID: Jeffrey Hines, male    DOB: [birth year], 71 y.o.   MRN: [id number]  Chief Complaint  Patient presents with  . Follow-up    PNA     Referring provider: [facility]  Brief patient profile:  71 yowm quit smoking 07/2014  followed for COPD, squamous cell carcinoma of the left upper lung diagnosed in 2016, Pseudomonas pneumonia., Nocturnal Hypoxia on O2 At bedtime  .   TEST Joya San  -Pseudomonas pneumonia. 2016  -Squama cell carcinoma 2016 status post chemotherapy and radiation. -March 2016 pulmonary function test> ratio 50%, FEV1 L (49% predicted, 18% change with bronchodilator), total lung capacity 6.73 L (93% predicted), DLCO 11.13 (33% predicted). June 2016 pulmonary function testing ratio 46%, FEV1 2.47 L (71% Pred), total lung capacity 7.79 L (107% predicted), DLCO 10.62 (31% predicted). 11/2014 ONO < 88% 2 hours -March 2018 simple spirometry test: Ratio 42%, FEV1 1.16 L 34% predicted, FVC 2.7660% percent predicted -08/31/2016>Walked 500 feet on room air O2 saturation ranged between 91 and 94%  -March 2018 CT chest: Status post left upper lobectomy, some mild centrilobular emphysema, mild fibrotic change right lower lobe, bronchovascular distribution question aspiration, images independently reviewed -Records from the [place] facility reviewed were his sleep study showed restless leg syndrome, low oxygen when sleeping but no evidence of obstructive sleep apnea.  04/06/2017 Follow up : PNA / NP ov Thank you patient presents for 1 week follow-up.  Patient has a known history of COPD and squamous cell carcinoma of the left upper lobe status post chemo and radiation in 2016.  Serial CT follow-up February 09, 2017 showed occlusion of the left lower lobe bronchus by patchy material slightly worsened favor mucus plugging. consolidation of the left lower lobe compatible with a postobstructive pneumonia.  Patient underwent a bronchoscopy September 19 that showed a tight  stenosis of the left upper lobe.  Biopsies were negative for malignancy.  Patient was seen at [facility] and underwent a bronchoscopy on March 16 2017 with a balloon dilation of the focal area of acquired bronchomalacia and stenosis of the left lower lobe orifice this was felt due to torsion of the lung post lobectomy.  Patient was treated with Levaquin for a postobstructive pneumonia.  Patient did not see any clinical improvement and felt that it was making him sick.  Patient was changed over to doxycycline.  Patient has been off antibiotics for 1 week.  Patient says he is feeling much improved.  He says this is  the best he has felt in 6 months.. Chest x-ray last week showed persistent left lower lobe airspace disease/postobstructive changes. Pt has been recommended to have CPAP . This is awaiting insurance approval . Patient denies any fever fatigue hemoptysis chest pain or discolored mucus.Marland Kitchen He remains on Symbicort and [place].  Appetite is improved.. Feels the best in 6 months  rec No change rx     04/26/17  PC re same cp > doxy x 10 days   05/05/2017  f/u ov/Armentha Branagan re:  Lung ca with prob recurrent  Post obst pna  Chief Complaint  Patient presents with  . Acute Visit    Pt states he had been having fever, aches and cough for the past few wks. Yesterday and today he feels "more normal".    Recurrent L cp under L breast assoc  minimally productive cough acutely ill on 11/21 with fever of 101.5,  Much better by 11/29 p 8 days of doxy, same as last round.  Presently no cp, doe = MMRC3 = can't walk 100 yards even at a slow pace at a flat grade s stopping due to sob  / no better with saba  While maint on symb/incruse - could not tol dpi spiriva but hasn't tried the smi version   No obvious day to day or daytime variability or assoc excess/ purulent sputum or mucus plugs or hemoptysis or  chest tightness, subjective wheeze or overt sinus or hb symptoms. No unusual exposure hx or h/o childhood  pna/ asthma or knowledge of premature birth.  Sleeping ok flat without nocturnal  or early am exacerbation  of respiratory  c/o's or need for noct saba. Also denies any obvious fluctuation of symptoms with weather or environmental changes or other aggravating or alleviating factors except as outlined above   Current Allergies, Complete Past Medical History, Past Surgical History, Family History, and Social History were reviewed in [employer] record.  ROS  The following are not active complaints unless bolded Hoarseness, sore throat, dysphagia, dental problems, itching, sneezing,  nasal congestion or discharge of excess mucus or purulent secretions, ear ache,   fever, chills, sweats, unintended wt loss or wt gain, classically pleuritic or exertional cp,  orthopnea pnd or leg swelling, presyncope, palpitations, abdominal pain, anorexia, nausea, vomiting, diarrhea  or change in bowel habits or change in bladder habits, change in stools or change in urine, dysuria, hematuria,  rash, arthralgias, visual complaints, headache, numbness, weakness or ataxia or problems with walking or coordination,  change in mood/affect or memory.        Current Meds  Medication Sig  . Bioflavonoid Products (BIOFLEX PO) Take 1 tablet by mouth 2 (two) times daily.   . clonazePAM (KLONOPIN) 0.5 MG tablet Take 0.5 mg by mouth 3 (three) times daily.  Marland Kitchen doxycycline (VIBRA-TABS) 100 MG tablet Take 1 tablet (100 mg total) by mouth 2 (two) times daily.  Marland Kitchen ibuprofen (ADVIL,MOTRIN) 200 MG tablet Take 400 mg by mouth 2 (two) times daily. Reported on 08/03/2015  . loratadine (CLARITIN) 10 MG tablet Take 10 mg by mouth daily.  Marland Kitchen PROVENTIL HFA 108 (90 BASE) MCG/ACT inhaler   . Respiratory Therapy Supplies (FLUTTER) DEVI 1 Device by Does not apply route as directed.  . SYMBICORT 160-4.5 MCG/ACT inhaler Inhale 2 puffs into the lungs 2 (two) times daily.  Marland Kitchen umeclidinium bromide (INCRUSE ELLIPTA) 62.5 MCG/INH AEPB  Inhale 1 puff into the lungs daily.  Marland Kitchen                Physical Exam Chronically ill amb wm nad  Wt Readings from Last 3 Encounters:  05/05/17 174 lb (78.9 kg)  04/06/17 174 lb (78.9 kg)  03/30/17 178 lb 9.6 oz (81 kg)     Vital signs reviewed - Note on arrival 02 sats  96% on RA    HEENT: nl dentition, turbinates bilaterally, and oropharynx. Nl external ear canals without cough reflex   NECK :  without JVD/Nodes/TM/ nl carotid upstrokes bilaterally   LUNGS: no acc muscle use,  Nl contour chest with scattered bilateral insp / exp rhonchi, no localized wheeze on L but BS are diminished.    CV:  RRR  no s3 or murmur or increase in P2, and no edema   ABD:  soft and nontender with nl inspiratory excursion in the supine position. No bruits or organomegaly appreciated, bowel sounds nl  MS:  Nl gait/ ext warm without deformities, calf tenderness, cyanosis or clubbing No obvious  joint restrictions   SKIN: warm and dry without lesions    NEURO:  alert, approp, nl sensorium with  no motor or cerebellar deficits apparent.      CXR PA and Lateral:   05/05/2017 :    I personally reviewed images and agree with radiology impression as follows:    Stable posttherapy changes of the LEFT lung. COPD changes RIGHT lung without acute infiltrate.        Assessment & Plan:

## 2017-05-06 ENCOUNTER — Encounter: Payer: Self-pay | Admitting: Internal Medicine

## 2017-05-06 NOTE — Assessment & Plan Note (Signed)
-   Status post left upper lobectomy summer 2016 - seen at [facility] and underwent a bronchoscopy on March 16 2017 with a balloon dilation of the focal area of acquired bronchomalacia and stenosis of the left lower lobe orifice this was felt due to torsion of the lung post lobectomy  Most likely experiencing post obst infection in same exact distributions and now has twice responded to doxy where previously failed levaquin so req continue cycles of doxy for flares, f/u Dr Lake Bells oncology

## 2017-05-06 NOTE — Assessment & Plan Note (Addendum)
March 2016 pulmonary function test> ratio 50%, FEV1 L (49% predicted, 18% change with bronchodilator), total lung capacity 6.73 L (93% predicted), DLCO 11.13 (33% predicted). June 2016 pulmonary function testing ratio 46%, FEV1 2.47 L (71% Pred), total lung capacity 7.79 L (107% predicted), DLCO 10.62 (31% predicted). 11/2014 ONO < 88% 2 hours  GOLD II criteria stable on symb/ incruse > no change in rx needed    I had an extended discussion with the patient reviewing all relevant studies completed to date and  lasting 15 to 20 minutes of a 25 minute acute office visit for pt new to me  Each maintenance medication was reviewed in detail including most importantly the difference between maintenance and prns and under what circumstances the prns are to be triggered using an action plan format that is not reflected in the computer generated alphabetically organized AVS.    Please see AVS for specific instructions unique to this visit that I personally wrote and verbalized to the the pt in detail and then reviewed with pt  by my nurse highlighting any  changes in therapy recommended at today's visit to their plan of care.

## 2017-05-09 ENCOUNTER — Ambulatory Visit: Payer: Self-pay | Admitting: Pulmonary Disease

## 2017-05-09 NOTE — Progress Notes (Deleted)
Subjective:    Patient ID: Jeffrey Hines, male    DOB: [birth year], 71 y.o.   MRN: [id number]  Synopsis: Mr. Colligan was diagnosed with squamous cell carcinoma of the left upper lung in 2016. He was hospitalized for Pseudomonas pneumonia at the same time. He started treatment with chemotherapy and radiation therapy in 2016. He also has COPD.  After chemo/radiation treatment of his left upper lobe malignancy he had a left upper lobectomy in the summer 2016. In the summer of 2018 he developed recurrent respiratory infections predominantly from the left lung. A bronchoscopy on 02/22/2017 was performed and showed tight stenosis in the left lower lobe. Biopsies were taken and were negative for malignancy.  He later went to [facility] and on March 16, 2017 he had a bronchoscopy with balloon dilation of a focal area of acquired bronchomalacia and stenosis of the left lower lobe orifice.  This is felt to be due to torsion of the lung post lobectomy.   HPI No chief complaint on file.  ***   Past Medical History:  Diagnosis Date  . Anxiety   . Cancer ([place])   . Chronic lower back pain   . Closed head injury 1998  . Constipation due to opioid therapy   . COPD (chronic obstructive pulmonary disease) ([place])   . COPD with chronic bronchitis ([place])   . Full dentures   . GERD (gastroesophageal reflux disease)   . Hemoptysis 08/03/2014  . Hypertension   . Multiple rib fractures 1998   left side  . On supplemental oxygen therapy    2L of O2 at night  . Post-obstruction pneumonia due to Pseudomonas aeruginosa 08/07/2014   Endobronchial mass causing LUL obstruction  . Radiation 08/25/14-09/29/14   left upper central lung 45 Gy  . Seizures ([place])   . Shortness of breath dyspnea   . Shoulder dislocation 1998   left  . Spontaneous pneumothorax 08/03/2014   Left side 1st time episode spontaneous pneumothorax associated with acute flare of COPD   . Tobacco abuse        Review of Systems    Constitutional: Negative for chills, fatigue and fever.  HENT: Negative for postnasal drip, rhinorrhea and sinus pressure.   Respiratory: Positive for shortness of breath and wheezing. Negative for cough.   Cardiovascular: Negative for chest pain, palpitations and leg swelling.       Objective:   Physical Exam  There were no vitals filed for this visit. RA   ***    PFT: March 2016 pulmonary function test> ratio 50%, FEV1 L (49% predicted, 18% change with bronchodilator), total lung capacity 6.73 L (93% predicted), DLCO 11.13 (33% predicted). June 2016 pulmonary function testing ratio 46%, FEV1 2.47 L (71% Pred), total lung capacity 7.79 L (107% predicted), DLCO 10.62 (31% predicted). March 2018 simple spirometry test: Ratio 42%, FEV1 1.16 L 34% predicted, FVC 2.76 60% percent predicted  11/2014 ONO < 88% 2 hours 08/31/2016> Walked 500 feet on room air O2 saturation ranged between 91 and 94%    Micro: 03/16/2017 bronch wash/BAL> oropharyngeal fluid 02/22/2017 BAL bact/fung/AFB> all negative  CBC    Component Value Date/Time   WBC 8.4 02/16/2017 1434   RBC 4.62 02/16/2017 1434   HGB 13.1 02/16/2017 1434   HGB 12.9 (L) 02/08/2017 1343   HCT 39.0 02/16/2017 1434   HCT 38.9 02/08/2017 1343   PLT 339.0 02/16/2017 1434   PLT 218 02/08/2017 1343   MCV 84.3 02/16/2017 1434   MCV 85.7  02/08/2017 1343   MCH 28.4 02/08/2017 1343   MCH 30.3 12/20/2014 0443   MCHC 33.7 02/16/2017 1434   RDW 13.2 02/16/2017 1434   RDW 12.8 02/08/2017 1343   LYMPHSABS 0.8 02/16/2017 1434   LYMPHSABS 0.9 02/08/2017 1343   MONOABS 0.9 02/16/2017 1434   MONOABS 0.5 02/08/2017 1343   EOSABS 0.1 02/16/2017 1434   EOSABS 0.1 02/08/2017 1343   BASOSABS 0.0 02/16/2017 1434   BASOSABS 0.0 02/08/2017 1343   Chest imaging:  March 2018 CT chest: Status post left upper lobectomy, some mild centrilobular emphysema, mild fibrotic change right lower lobe, bronchovascular distribution question aspiration,  images independently reviewed September 2018 CT chest images independently reviewed showing that he is status post left lower lobectomy, there is significant nodular consolidation and tree-in-bud abnormalities in the left lung, some mucus plugging.  Pathology report: September 2018 left mainstem bronchus biopsy: Negative for malignancy, fibrotic changes noted, cytology from brushing negative  Microbiology: September 19 wash and brushing: Multiple species present, none predominant , fungus and AFB pending     Assessment & Plan:   No diagnosis found.  ***   Current Outpatient Medications:  .  Bioflavonoid Products (BIOFLEX PO), Take 1 tablet by mouth 2 (two) times daily. , Disp: , Rfl:  .  clonazePAM (KLONOPIN) 0.5 MG tablet, Take 0.5 mg by mouth 3 (three) times daily., Disp: , Rfl:  .  doxycycline (VIBRA-TABS) 100 MG tablet, Take 1 tablet (100 mg total) by mouth 2 (two) times daily., Disp: 20 tablet, Rfl: 5 .  ibuprofen (ADVIL,MOTRIN) 200 MG tablet, Take 400 mg by mouth 2 (two) times daily. Reported on 08/03/2015, Disp: , Rfl:  .  loratadine (CLARITIN) 10 MG tablet, Take 10 mg by mouth daily., Disp: , Rfl:  .  PROVENTIL HFA 108 (90 BASE) MCG/ACT inhaler, , Disp: , Rfl:  .  Respiratory Therapy Supplies (FLUTTER) DEVI, 1 Device by Does not apply route as directed., Disp: 1 each, Rfl: 0 .  SYMBICORT 160-4.5 MCG/ACT inhaler, Inhale 2 puffs into the lungs 2 (two) times daily., Disp: , Rfl:  .  umeclidinium bromide (INCRUSE ELLIPTA) 62.5 MCG/INH AEPB, Inhale 1 puff into the lungs daily., Disp: 30 each, Rfl: 11

## 2017-05-12 ENCOUNTER — Telehealth: Payer: Self-pay | Admitting: Pulmonary Disease

## 2017-05-12 MED ORDER — PREDNISONE 10 MG PO TABS: ORAL_TABLET | ORAL | 0 refills | Status: DC

## 2017-05-12 MED ORDER — AMOXICILLIN-POT CLAVULANATE 875-125 MG PO TABS: 1.0000 | ORAL_TABLET | Freq: Two times a day (BID) | ORAL | 0 refills | Status: DC

## 2017-05-12 NOTE — Telephone Encounter (Signed)
Spoke with pt who stated that he was prescribed doxycycline on 05/05/17 which is not helping with his symptoms of a headache and a  cough with light green mucus.  Pt states that he began running a fever of 101.6 x2 days ago but currently is afebrile.  Dr. Melvyn Novas. Please advise if there is anything else that we can prescribe for pt since doxycycline has not helped.

## 2017-05-12 NOTE — Telephone Encounter (Signed)
It's because there is blockage causing the infection which hasn't been aleviated   Suggest try  Prednisone 10 mg take  4 each am x 2 days,   2 each am x 2 days,  1 each am x 2 days and stop Augmentin 875 mg take one pill twice daily  X 10 days - take at breakfast and supper with large glass of water.  It would help reduce the usual side effects (diarrhea and yeast infections) if you ate cultured yogurt at lunch.  F/u with Dr Spero Curb next avail

## 2017-05-12 NOTE — Telephone Encounter (Signed)
Called and spoke with pt and he is aware of MW recs.  He will call back if not feeling better after taking these meds as BQ schedule is booked out until 1/15 when the pt already has an appt.

## 2017-06-15 ENCOUNTER — Telehealth: Payer: Self-pay | Admitting: Internal Medicine

## 2017-06-15 MED ORDER — PREDNISONE 20 MG PO TABS: 20.0000 mg | ORAL_TABLET | Freq: Every day | ORAL | 0 refills | Status: DC

## 2017-06-15 MED ORDER — AMOXICILLIN-POT CLAVULANATE 875-125 MG PO TABS: 1.0000 | ORAL_TABLET | Freq: Two times a day (BID) | ORAL | 0 refills | Status: DC

## 2017-06-15 NOTE — Telephone Encounter (Signed)
Pt c/o increased sob, prod cough with green mucus, sinus congestion, pnd, HA, body aches, chills.  Denies fever, chest pains.  S/s present X1 week.   Taking ibuprofen, maintenance meds to help with s/s.  Requesting amoxicillin and pred taper.    Pt uses [facility] in [place]   BQ please advise on recs.  Thanks.

## 2017-06-15 NOTE — Telephone Encounter (Signed)
Pt is aware of below message and voiced his understanding.  Rx for Augmentin 875 and prednisone 20mg  has been sent to preferred pharmacy. Nothing further is needed.

## 2017-06-15 NOTE — Telephone Encounter (Signed)
Prednisone 20mg  daily x 5 days Augmentin 875 bid x 5 days Call if no improvment

## 2017-06-20 ENCOUNTER — Encounter: Payer: Self-pay | Admitting: Pulmonary Disease

## 2017-06-20 ENCOUNTER — Ambulatory Visit (INDEPENDENT_AMBULATORY_CARE_PROVIDER_SITE_OTHER): Payer: Medicare Other | Admitting: Pulmonary Disease

## 2017-06-20 VITALS — BP 134/76 | HR 94 | Ht 70.0 in | Wt 175.0 lb

## 2017-06-20 DIAGNOSIS — J441 Chronic obstructive pulmonary disease with (acute) exacerbation: Secondary | ICD-10-CM

## 2017-06-20 DIAGNOSIS — J189 Pneumonia, unspecified organism: Secondary | ICD-10-CM

## 2017-06-20 DIAGNOSIS — C3412 Malignant neoplasm of upper lobe, left bronchus or lung: Secondary | ICD-10-CM

## 2017-06-20 MED ORDER — DOXYCYCLINE HYCLATE 100 MG PO TABS: ORAL_TABLET | ORAL | 2 refills | Status: DC

## 2017-06-20 MED ORDER — AMOXICILLIN-POT CLAVULANATE 875-125 MG PO TABS: ORAL_TABLET | ORAL | 2 refills | Status: DC

## 2017-06-20 NOTE — Patient Instructions (Addendum)
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 Julien Nordmann  COPD: Continue Incruise and Symbicort and as needed albuterol  I will see you back in May

## 2017-06-20 NOTE — Progress Notes (Signed)
Subjective:    Patient ID: Jeffrey Hines, male    DOB: [birth year], 72 y.o.   MRN: [id number]  Synopsis: Jeffrey Hines was diagnosed with squamous cell carcinoma of the left upper lung in 2016. He was hospitalized for Pseudomonas pneumonia at the same time. He started treatment with chemotherapy and radiation therapy in 2016. He also has COPD.  After chemo/radiation treatment of his left upper lobe malignancy he had a left upper lobectomy in the summer 2016. In the summer of 2018 he developed recurrent respiratory infections predominantly from the left lung. A bronchoscopy on 02/22/2017 was performed and showed tight stenosis in the left lower lobe. Biopsies were taken and were negative for malignancy.  He later went to [facility] and on March 16, 2017 he had a bronchoscopy with balloon dilation of a focal area of acquired bronchomalacia and stenosis of the left lower lobe orifice.  This is felt to be due to torsion of the lung post lobectomy.   HPI Chief Complaint  Patient presents with  . Follow-up    pt states he is doing well today.    He was feeling poorly last week so he had to call in a prescription for prednisone and Augmentin. He said that he woke up with aching in his arms and legs and he had a fever to 102.  He had chest congestion, cough and dyspnea during this time.  He had a bad headache.    He has yet to pick up his CPAP machine that we prescribed.  He says that he needs to call back to the company to pick it up.    He says that the Augmentin worked pretty well this time.      Past Medical History:  Diagnosis Date  . Anxiety   . Cancer ([place])   . Chronic lower back pain   . Closed head injury 1998  . Constipation due to opioid therapy   . COPD (chronic obstructive pulmonary disease) ([place])   . COPD with chronic bronchitis ([place])   . Full dentures   . GERD (gastroesophageal reflux disease)   . Hemoptysis 08/03/2014  . Hypertension   . Multiple rib fractures 1998   left side  . On supplemental oxygen therapy    2L of O2 at night  . Post-obstruction pneumonia due to Pseudomonas aeruginosa 08/07/2014   Endobronchial mass causing LUL obstruction  . Radiation 08/25/14-09/29/14   left upper central lung 45 Gy  . Seizures ([place])   . Shortness of breath dyspnea   . Shoulder dislocation 1998   left  . Spontaneous pneumothorax 08/03/2014   Left side 1st time episode spontaneous pneumothorax associated with acute flare of COPD   . Tobacco abuse        Review of Systems  Constitutional: Negative for chills, fatigue and fever.  HENT: Negative for postnasal drip, rhinorrhea and sinus pressure.   Respiratory: Positive for shortness of breath and wheezing. Negative for cough.   Cardiovascular: Negative for chest pain, palpitations and leg swelling.       Objective:   Physical Exam  Vitals:   06/20/17 1500  BP: 134/76  Pulse: 94  SpO2: 99%  Weight: 175 lb (79.4 kg)  Height: _0  (1.778 m)   RA   Gen: chronically ill appearing HENT: OP clear, TM's clear, neck supple PULM: CTA B, normal percussion CV: RRR, no mgr, trace edema GI: BS+, soft, nontender Derm: no cyanosis or rash Psyche: normal mood and affect  PFT: March 2016 pulmonary function test> ratio 50%, FEV1 L (49% predicted, 18% change with bronchodilator), total lung capacity 6.73 L (93% predicted), DLCO 11.13 (33% predicted). June 2016 pulmonary function testing ratio 46%, FEV1 2.47 L (71% Pred), total lung capacity 7.79 L (107% predicted), DLCO 10.62 (31% predicted). March 2018 simple spirometry test: Ratio 42%, FEV1 1.16 L 34% predicted, FVC 2.76 60% percent predicted  11/2014 ONO < 88% 2 hours 08/31/2016> Walked 500 feet on room air O2 saturation ranged between 91 and 94%    Micro: 03/16/2017 bronch wash/BAL> oropharyngeal fluid 02/22/2017 BAL bact/fung/AFB> all negative  CBC    Component Value Date/Time   WBC 8.4 02/16/2017 1434   RBC 4.62 02/16/2017 1434   HGB 13.1  02/16/2017 1434   HGB 12.9 (L) 02/08/2017 1343   HCT 39.0 02/16/2017 1434   HCT 38.9 02/08/2017 1343   PLT 339.0 02/16/2017 1434   PLT 218 02/08/2017 1343   MCV 84.3 02/16/2017 1434   MCV 85.7 02/08/2017 1343   MCH 28.4 02/08/2017 1343   MCH 30.3 12/20/2014 0443   MCHC 33.7 02/16/2017 1434   RDW 13.2 02/16/2017 1434   RDW 12.8 02/08/2017 1343   LYMPHSABS 0.8 02/16/2017 1434   LYMPHSABS 0.9 02/08/2017 1343   MONOABS 0.9 02/16/2017 1434   MONOABS 0.5 02/08/2017 1343   EOSABS 0.1 02/16/2017 1434   EOSABS 0.1 02/08/2017 1343   BASOSABS 0.0 02/16/2017 1434   BASOSABS 0.0 02/08/2017 1343   Chest imaging:  March 2018 CT chest: Status post left upper lobectomy, some mild centrilobular emphysema, mild fibrotic change right lower lobe, bronchovascular distribution question aspiration, images independently reviewed September 2018 CT chest images independently reviewed showing that he is status post left lower lobectomy, there is significant nodular consolidation and tree-in-bud abnormalities in the left lung, some mucus plugging.  Pathology report: September 2018 left mainstem bronchus biopsy: Negative for malignancy, fibrotic changes noted, cytology from brushing negative  Microbiology: September 19 wash and brushing: Multiple species present, none predominant , fungus and AFB pending       Assessment & Plan:   Recurrent pneumonia  Malignant neoplasm of upper lobe of left lung ([place])  COPD with acute exacerbation ([place])  Discussion: Lamere has had recurrent respiratory infections for the last several months.  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  Fortunately he has not had evidence of recurrent bronchogenic cancer based on bronchoscopy and biopsy.  I think the best approach moving forward is to put him on scheduled antibiotics because he is needed antibiotics regularly.  He also needs to start using CPAP for  a pneumatic stent to help improve mucociliary clearance.  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 Julien Nordmann  COPD: Continue Incruise and Symbicort and as needed albuterol  I will see you back in May 2019   Current Outpatient Medications:  .  Bioflavonoid Products (BIOFLEX PO), Take 1 tablet by mouth 2 (  two) times daily. , Disp: , Rfl:  .  clonazePAM (KLONOPIN) 0.5 MG tablet, Take 0.5 mg by mouth 3 (three) times daily., Disp: , Rfl:  .  ibuprofen (ADVIL,MOTRIN) 200 MG tablet, Take 400 mg by mouth 2 (two) times daily. Reported on 08/03/2015, Disp: , Rfl:  .  loratadine (CLARITIN) 10 MG tablet, Take 10 mg by mouth daily., Disp: , Rfl:  .  PROVENTIL HFA 108 (90 BASE) MCG/ACT inhaler, , Disp: , Rfl:  .  Respiratory Therapy Supplies (FLUTTER) DEVI, 1 Device by Does not apply route as directed., Disp: 1 each, Rfl: 0 .  SYMBICORT 160-4.5 MCG/ACT inhaler, Inhale 2 puffs into the lungs 2 (two) times daily., Disp: , Rfl:  .  umeclidinium bromide (INCRUSE ELLIPTA) 62.5 MCG/INH AEPB, Inhale 1 puff into the lungs daily., Disp: 30 each, Rfl: 11 .  amoxicillin-clavulanate (AUGMENTIN) 875-125 MG tablet, Take 1 tab BID Xfirst 5 days of every other month (Mar, May, Jul), Disp: 10 tablet, Rfl: 2 .  doxycycline (VIBRA-TABS) 100 MG tablet, Take 1 tab BID Xfirst 5 days of every other month (Feb, Apr, Jun), Disp: 10 tablet, Rfl: 2

## 2017-08-11 ENCOUNTER — Telehealth: Payer: Self-pay | Admitting: Pulmonary Disease

## 2017-08-11 MED ORDER — AMOXICILLIN-POT CLAVULANATE 875-125 MG PO TABS: 1.0000 | ORAL_TABLET | Freq: Two times a day (BID) | ORAL | 0 refills | Status: DC

## 2017-08-11 NOTE — Telephone Encounter (Signed)
augmentin is already listed on MAR so just needs a refill

## 2017-08-11 NOTE — Telephone Encounter (Signed)
Pt reports of temp 99.8 this morning, headache, body aches, wheezing, dry cough occ prod with green mucus x4d Pt has been prescribed doxy 1 tab bid first 5 days every other month Feb, April & Jun. Pt is requesting amoxicillin.   MW please advise, as BQ is on vacation. Thanks  AVS 06/20/17: Prior Versions: 1. 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 Julien Nordmann  COPD: Continue Incruise and Symbicort and as needed albuterol  I will see you back in May

## 2017-08-11 NOTE — Telephone Encounter (Signed)
Called pt letting him know since he already had the Augmentin listed on his med list we were going to refill that medication for him.  Pt expressed understanding. Nothing further needed at this current time.  Rx sent to pt's preferred pharmacy.

## 2017-08-21 ENCOUNTER — Inpatient Hospital Stay: Payer: Medicare Other | Attending: Internal Medicine

## 2017-08-21 ENCOUNTER — Ambulatory Visit: Payer: Self-pay | Admitting: Internal Medicine

## 2017-08-21 DIAGNOSIS — C3412 Malignant neoplasm of upper lobe, left bronchus or lung: Secondary | ICD-10-CM | POA: Diagnosis not present

## 2017-08-21 DIAGNOSIS — J449 Chronic obstructive pulmonary disease, unspecified: Secondary | ICD-10-CM

## 2017-08-21 DIAGNOSIS — C3490 Malignant neoplasm of unspecified part of unspecified bronchus or lung: Secondary | ICD-10-CM

## 2017-08-21 DIAGNOSIS — R05 Cough: Secondary | ICD-10-CM | POA: Diagnosis not present

## 2017-08-21 DIAGNOSIS — C3492 Malignant neoplasm of unspecified part of left bronchus or lung: Secondary | ICD-10-CM

## 2017-08-21 LAB — CBC WITH DIFFERENTIAL/PLATELET
BAND NEUTROPHILS: 0 %
BASOS ABS: 0 10*3/uL (ref 0.0–0.1)
BASOS PCT: 0 %
Blasts: 0 %
EOS ABS: 0.1 10*3/uL (ref 0.0–0.5)
Eosinophils Relative: 3 %
HEMATOCRIT: 39.8 % (ref 38.4–49.9)
HEMOGLOBIN: 13.2 g/dL (ref 13.0–17.1)
Lymphocytes Relative: 16 %
Lymphs Abs: 0.8 10*3/uL — ABNORMAL LOW (ref 0.9–3.3)
MCH: 28.2 pg (ref 27.2–33.4)
MCHC: 33.2 g/dL (ref 32.0–36.0)
MCV: 85 fL (ref 79.3–98.0)
METAMYELOCYTES PCT: 0 %
MONO ABS: 0.5 10*3/uL (ref 0.1–0.9)
MYELOCYTES: 0 %
Monocytes Relative: 11 %
Neutro Abs: 3.3 10*3/uL (ref 1.5–6.5)
Neutrophils Relative %: 70 %
Other: 0 %
PROMYELOCYTES ABS: 0 %
Platelets: 247 10*3/uL (ref 140–400)
RBC: 4.68 MIL/uL (ref 4.20–5.82)
RDW: 14.2 % (ref 11.0–14.6)
WBC: 4.7 10*3/uL (ref 4.0–10.3)
nRBC: 0 /100 WBC

## 2017-08-21 LAB — COMPREHENSIVE METABOLIC PANEL
ALBUMIN: 3.4 g/dL — AB (ref 3.5–5.0)
ALK PHOS: 76 U/L (ref 40–150)
ALT: 12 U/L (ref 0–55)
AST: 18 U/L (ref 5–34)
Anion gap: 6 (ref 3–11)
BILIRUBIN TOTAL: 0.4 mg/dL (ref 0.2–1.2)
BUN: 16 mg/dL (ref 7–26)
CALCIUM: 9.4 mg/dL (ref 8.4–10.4)
CO2: 31 mmol/L — AB (ref 22–29)
CREATININE: 0.96 mg/dL (ref 0.70–1.30)
Chloride: 103 mmol/L (ref 98–109)
GFR calc non Af Amer: >60 mL/min (ref >60)
GLUCOSE: 114 mg/dL (ref 70–140)
Potassium: 4.2 mmol/L (ref 3.5–5.1)
SODIUM: 140 mmol/L (ref 136–145)
TOTAL PROTEIN: 7.7 g/dL (ref 6.4–8.3)

## 2017-08-22 ENCOUNTER — Ambulatory Visit (HOSPITAL_COMMUNITY)
Admission: RE | Admit: 2017-08-22 | Discharge: 2017-08-22 | Disposition: A | Discharge status: Home / Self Care | Payer: Medicare Other | Source: Ambulatory Visit | Attending: Internal Medicine | Admitting: Internal Medicine

## 2017-08-22 ENCOUNTER — Encounter (HOSPITAL_COMMUNITY): Payer: Self-pay

## 2017-08-22 DIAGNOSIS — J449 Chronic obstructive pulmonary disease, unspecified: Secondary | ICD-10-CM

## 2017-08-22 DIAGNOSIS — Z9889 Other specified postprocedural states: Secondary | ICD-10-CM | POA: Diagnosis not present

## 2017-08-22 DIAGNOSIS — J4489 Other specified chronic obstructive pulmonary disease: Secondary | ICD-10-CM

## 2017-08-22 DIAGNOSIS — C3492 Malignant neoplasm of unspecified part of left bronchus or lung: Secondary | ICD-10-CM

## 2017-08-22 DIAGNOSIS — C3490 Malignant neoplasm of unspecified part of unspecified bronchus or lung: Secondary | ICD-10-CM | POA: Diagnosis not present

## 2017-08-22 DIAGNOSIS — Z85118 Personal history of other malignant neoplasm of bronchus and lung: Secondary | ICD-10-CM | POA: Insufficient documentation

## 2017-08-22 DIAGNOSIS — R918 Other nonspecific abnormal finding of lung field: Secondary | ICD-10-CM | POA: Insufficient documentation

## 2017-08-22 DIAGNOSIS — I700 Atherosclerosis of aorta: Secondary | ICD-10-CM | POA: Insufficient documentation

## 2017-08-22 DIAGNOSIS — J439 Emphysema, unspecified: Secondary | ICD-10-CM | POA: Insufficient documentation

## 2017-08-22 MED ORDER — IOPAMIDOL (ISOVUE-300) INJECTION 61%
75.0000 mL | Freq: Once | INTRAVENOUS | Status: AC | PRN
  Administered 2017-08-22: 75 mL via INTRAVENOUS

## 2017-08-22 MED ORDER — IOPAMIDOL (ISOVUE-300) INJECTION 61%
INTRAVENOUS | Status: AC
  Filled 2017-08-22: qty 75

## 2017-08-28 ENCOUNTER — Encounter: Payer: Self-pay | Admitting: Internal Medicine

## 2017-08-28 ENCOUNTER — Inpatient Hospital Stay (HOSPITAL_BASED_OUTPATIENT_CLINIC_OR_DEPARTMENT_OTHER): Payer: Medicare Other | Admitting: Internal Medicine

## 2017-08-28 ENCOUNTER — Telehealth: Payer: Self-pay

## 2017-08-28 DIAGNOSIS — C3490 Malignant neoplasm of unspecified part of unspecified bronchus or lung: Secondary | ICD-10-CM

## 2017-08-28 DIAGNOSIS — R05 Cough: Secondary | ICD-10-CM

## 2017-08-28 DIAGNOSIS — C3412 Malignant neoplasm of upper lobe, left bronchus or lung: Secondary | ICD-10-CM | POA: Diagnosis not present

## 2017-08-28 NOTE — Addendum Note (Signed)
Addended by: Ardeen Garland on: 08/28/2017 02:15 PM   Modules accepted: Orders

## 2017-08-28 NOTE — Telephone Encounter (Signed)
Printed avs and calender of upcoming appointment. Per 3/25 los
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
## 2017-10-02 ENCOUNTER — Other Ambulatory Visit: Payer: Self-pay | Admitting: Pulmonary Disease

## 2017-11-13 ENCOUNTER — Other Ambulatory Visit: Payer: Self-pay | Admitting: Pulmonary Disease

## 2017-12-31 ENCOUNTER — Other Ambulatory Visit: Payer: Self-pay | Admitting: Pulmonary Disease

## 2018-02-02 ENCOUNTER — Other Ambulatory Visit: Payer: Self-pay | Admitting: Pulmonary Disease

## 2018-02-02 ENCOUNTER — Telehealth: Payer: Self-pay | Admitting: Pulmonary Disease

## 2018-02-02 DIAGNOSIS — J189 Pneumonia, unspecified organism: Secondary | ICD-10-CM

## 2018-02-02 MED ORDER — DOXYCYCLINE HYCLATE 100 MG PO TABS: ORAL_TABLET | ORAL | 2 refills | Status: DC

## 2018-02-02 NOTE — Telephone Encounter (Signed)
Spoke with patient, requesting Doxycycline be called in. Per [facility]S patient takes alternating dose of Antibiotics monthly.   On February 1 take doxycycline 100 mg twice a day for 5 days On March 1 take Augmentin 875 twice a day for 5 days On April 1 take doxycycline 100 mg twice a day for 5 days On May 1 take Augmentin 875 twice a day for 5 days Come back and see me in May  Next appt is 03/02/2018. Patient is due for Doxycycline 100mg  BID x 5 days.  MD McQuaid please advise if okay to fill.

## 2018-02-02 NOTE — Telephone Encounter (Signed)
Spoke with patient, made aware prescription has been sent to pharmacy, Appt 03/02/2018 @ 2:15pm. Nothing further needed at this time. Will route to MD McQuaid as FYI.

## 2018-02-02 NOTE — Telephone Encounter (Signed)
OK to refill He needs to come in and see me too, next available

## 2018-02-06 NOTE — Telephone Encounter (Signed)
thanks

## 2018-02-09 ENCOUNTER — Telehealth: Payer: Self-pay | Admitting: Oncology

## 2018-02-09 NOTE — Telephone Encounter (Signed)
Tried to call regarding 9/20 I did leave a message

## 2018-02-23 ENCOUNTER — Ambulatory Visit (HOSPITAL_COMMUNITY)
Admission: RE | Admit: 2018-02-23 | Discharge: 2018-02-23 | Disposition: A | Discharge status: Home / Self Care | Payer: Medicare Other | Source: Ambulatory Visit | Attending: Internal Medicine | Admitting: Internal Medicine

## 2018-02-23 ENCOUNTER — Inpatient Hospital Stay: Payer: Medicare Other | Attending: Internal Medicine

## 2018-02-23 DIAGNOSIS — I700 Atherosclerosis of aorta: Secondary | ICD-10-CM | POA: Diagnosis not present

## 2018-02-23 DIAGNOSIS — Z79899 Other long term (current) drug therapy: Secondary | ICD-10-CM | POA: Insufficient documentation

## 2018-02-23 DIAGNOSIS — C3490 Malignant neoplasm of unspecified part of unspecified bronchus or lung: Secondary | ICD-10-CM

## 2018-02-23 DIAGNOSIS — C3412 Malignant neoplasm of upper lobe, left bronchus or lung: Secondary | ICD-10-CM | POA: Insufficient documentation

## 2018-02-23 DIAGNOSIS — I10 Essential (primary) hypertension: Secondary | ICD-10-CM | POA: Diagnosis not present

## 2018-02-23 DIAGNOSIS — R5383 Other fatigue: Secondary | ICD-10-CM | POA: Insufficient documentation

## 2018-02-23 DIAGNOSIS — Z923 Personal history of irradiation: Secondary | ICD-10-CM | POA: Insufficient documentation

## 2018-02-23 DIAGNOSIS — J439 Emphysema, unspecified: Secondary | ICD-10-CM | POA: Diagnosis not present

## 2018-02-23 LAB — CBC WITH DIFFERENTIAL (CANCER CENTER ONLY)
BASOS PCT: 0 %
Basophils Absolute: 0 10*3/uL (ref 0.0–0.1)
Eosinophils Absolute: 0.2 10*3/uL (ref 0.0–0.5)
Eosinophils Relative: 5 %
HEMATOCRIT: 41.4 % (ref 38.4–49.9)
Hemoglobin: 13.5 g/dL (ref 13.0–17.1)
LYMPHS ABS: 0.7 10*3/uL — AB (ref 0.9–3.3)
Lymphocytes Relative: 15 %
MCH: 28.7 pg (ref 27.2–33.4)
MCHC: 32.6 g/dL (ref 32.0–36.0)
MCV: 88.1 fL (ref 79.3–98.0)
MONO ABS: 0.7 10*3/uL (ref 0.1–0.9)
MONOS PCT: 15 %
NEUTROS ABS: 3 10*3/uL (ref 1.5–6.5)
Neutrophils Relative %: 65 %
Platelet Count: 194 10*3/uL (ref 140–400)
RBC: 4.7 MIL/uL (ref 4.20–5.82)
RDW: 13.1 % (ref 11.0–14.6)
WBC Count: 4.6 10*3/uL (ref 4.0–10.3)

## 2018-02-23 LAB — CMP (CANCER CENTER ONLY)
ALBUMIN: 3.7 g/dL (ref 3.5–5.0)
ALK PHOS: 86 U/L (ref 38–126)
ALT: 11 U/L (ref 0–44)
ANION GAP: 8 (ref 5–15)
AST: 18 U/L (ref 15–41)
BUN: 14 mg/dL (ref 8–23)
CALCIUM: 8.7 mg/dL — AB (ref 8.9–10.3)
CO2: 29 mmol/L (ref 22–32)
CREATININE: 0.92 mg/dL (ref 0.61–1.24)
Chloride: 105 mmol/L (ref 98–111)
GFR, Est AFR Am: >60 mL/min (ref >60)
GFR, Estimated: >60 mL/min (ref >60)
GLUCOSE: 87 mg/dL (ref 70–99)
Potassium: 3.9 mmol/L (ref 3.5–5.1)
SODIUM: 142 mmol/L (ref 135–145)
Total Bilirubin: 0.5 mg/dL (ref 0.3–1.2)
Total Protein: 7.7 g/dL (ref 6.5–8.1)

## 2018-02-23 MED ORDER — IOHEXOL 300 MG/ML  SOLN
75.0000 mL | Freq: Once | INTRAMUSCULAR | Status: AC | PRN
  Administered 2018-02-23: 75 mL via INTRAVENOUS

## 2018-02-28 ENCOUNTER — Encounter: Payer: Self-pay | Admitting: Oncology

## 2018-02-28 ENCOUNTER — Telehealth: Payer: Self-pay

## 2018-02-28 ENCOUNTER — Other Ambulatory Visit: Payer: Self-pay

## 2018-02-28 ENCOUNTER — Inpatient Hospital Stay (HOSPITAL_BASED_OUTPATIENT_CLINIC_OR_DEPARTMENT_OTHER): Payer: Medicare Other | Admitting: Oncology

## 2018-02-28 VITALS — BP 140/107 | HR 97 | Temp 98.4°F | Resp 18 | Ht 70.0 in | Wt 179.5 lb

## 2018-02-28 DIAGNOSIS — C3412 Malignant neoplasm of upper lobe, left bronchus or lung: Secondary | ICD-10-CM | POA: Diagnosis not present

## 2018-02-28 DIAGNOSIS — R5383 Other fatigue: Secondary | ICD-10-CM | POA: Diagnosis not present

## 2018-02-28 DIAGNOSIS — C3492 Malignant neoplasm of unspecified part of left bronchus or lung: Secondary | ICD-10-CM

## 2018-02-28 DIAGNOSIS — Z79899 Other long term (current) drug therapy: Secondary | ICD-10-CM | POA: Diagnosis not present

## 2018-02-28 DIAGNOSIS — I10 Essential (primary) hypertension: Secondary | ICD-10-CM
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
## 2018-02-28 NOTE — Telephone Encounter (Signed)
Printed avs and calender of upcoming appointment. Per 9/25 los

## 2018-02-28 NOTE — Assessment & Plan Note (Signed)
This is a very pleasant 72 year old white male with stage IIB non-small cell lung cancer, squamous cell carcinoma status post neoadjuvant concurrent chemoradiation with significant improvement of his disease followed by left lower lobectomy and lymph node dissection. The patient is currently on observation and he is feeling fine. He had a restaging CT scan of the chest and is here to discuss the results.  The patient was seen with Dr. Julien Nordmann.  CT scan results were discussed with the patient which showed no evidence of disease recurrence.  Recommend that he continue observation.  He will have a restaging CT scan of the chest in 6 months and follow-up 1 to 2 days after the CT scan to discuss the results.  The patient's blood pressure is elevated today.  He states that he was previous on medication but stopped on his own.  We advised him to follow-up with his primary care provider for further evaluation and management of his blood pressure.  He was advised to call immediately if he has any concerning symptoms in the interval. The patient voices understanding of current disease status and treatment options and is in agreement with the current care plan. All questions were answered. The patient knows to call the clinic with any problems, questions or concerns. We can certainly see the patient much sooner if necessary.

## 2018-03-02 ENCOUNTER — Ambulatory Visit (INDEPENDENT_AMBULATORY_CARE_PROVIDER_SITE_OTHER): Payer: Medicare Other | Admitting: Pulmonary Disease

## 2018-03-02 ENCOUNTER — Encounter: Payer: Self-pay | Admitting: Pulmonary Disease

## 2018-03-02 VITALS — BP 136/84 | HR 109 | Ht 71.0 in | Wt 179.2 lb

## 2018-03-02 DIAGNOSIS — G2581 Restless legs syndrome: Secondary | ICD-10-CM | POA: Diagnosis not present

## 2018-03-02 DIAGNOSIS — J441 Chronic obstructive pulmonary disease with (acute) exacerbation: Secondary | ICD-10-CM | POA: Diagnosis not present

## 2018-03-02 DIAGNOSIS — J9809 Other diseases of bronchus, not elsewhere classified: Secondary | ICD-10-CM

## 2018-03-02 DIAGNOSIS — J189 Pneumonia, unspecified organism: Secondary | ICD-10-CM | POA: Diagnosis not present

## 2018-03-02 DIAGNOSIS — C3412 Malignant neoplasm of upper lobe, left bronchus or lung: Secondary | ICD-10-CM

## 2018-03-02 DIAGNOSIS — C3492 Malignant neoplasm of unspecified part of left bronchus or lung: Secondary | ICD-10-CM

## 2018-03-02 MED ORDER — ROPINIROLE HCL 0.25 MG PO TABS: 0.2500 mg | ORAL_TABLET | Freq: Every day | ORAL | 2 refills | Status: DC

## 2018-03-02 MED ORDER — AMOXICILLIN-POT CLAVULANATE 875-125 MG PO TABS: ORAL_TABLET | ORAL | 3 refills | Status: DC

## 2018-03-02 MED ORDER — DOXYCYCLINE HYCLATE 100 MG PO TABS: ORAL_TABLET | ORAL | 2 refills | Status: DC
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
## 2018-03-02 NOTE — Patient Instructions (Signed)
Restless leg syndrome (new problem): Start taking Requip 1 hour before bedtime Initial: 0.25 mg once daily 1 to 3 hours before bedtime. Dose may be increased after 2 days to 0.5 mg daily, and after 7 days to 1 mg daily. Dose may be further titrated upward in 0.5 mg increments every week until reaching a daily dose of 3 mg during week 6. Daily dose may be increased to a maximum of 4 mg beginning week 7.  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

## 2018-03-08 ENCOUNTER — Telehealth: Payer: Self-pay | Admitting: Pulmonary Disease

## 2018-03-08 DIAGNOSIS — J189 Pneumonia, unspecified organism: Secondary | ICD-10-CM

## 2018-03-08 MED ORDER — DOXYCYCLINE HYCLATE 100 MG PO TABS: ORAL_TABLET | ORAL | 2 refills | Status: DC

## 2018-03-08 MED ORDER — AMOXICILLIN-POT CLAVULANATE 875-125 MG PO TABS: ORAL_TABLET | ORAL | 3 refills | Status: DC

## 2018-03-08 NOTE — Telephone Encounter (Signed)
Spoke with pt,  He states he does not have the correct quantity for the Augmentin Rx. He states he is supposed to have 14 pills. Please clarify Dr. Lake Bells because I am not sure if you wanted him to take it once daily this time around. Thanks.  amoxicillin-clavulanate (AUGMENTIN) 875-125 MG tablet [[id number]]   Order Details  Dose, Route, Frequency: As Directed Note to Pharmacy:  Take first 7 days of every other month    Dispense Quantity: 7 tablet Refills: 3 Fills remaining: --        Sig: TAKE 1 TABLET BY MOUTH TWICE A DAY FIRST 5 DAYS OF EVERY OTHER MONTH (MAR, MAY, JULY)       Written Date: 03/02/18 Expiration Date: 03/02/19    Start Date: 03/02/18 End Date: --         Ordering Provider: Juanito Doom, MD DEA #:  [id number] NPI:  [id number]   Authorizing Provider: Juanito Doom, MD DEA #:  [id number] NPI:  [id number]   Ordering User:  Valerie Salts, CMA            Original Order:  amoxicillin-clavulanate (AUGMENTIN) 875-125 MG tablet [[id number]]    Pharmacy:  [id number] - [place], [place] #:  [id number]    Pharmacy Comments:  --       Fill quantity remaining:  -- Fill quantity used:  -- Next fill due: --        Order Class   Normal  Lab Test Results   Component Date/Time Value Range & Unit Status  Creatinine 02/23/18 1052 0.92 0.61 - 1.24 mg/dL Final  All Administrations of amoxicillin-clavulanate (AUGMENTIN) 875-125 MG tablet   (suggestion)  The administrations shown are only for this specific order and not for other orders for the same medication that may be in this encounter.  No Administrations Recorded

## 2018-03-08 NOTE — Telephone Encounter (Signed)
Twice a day first day of the month

## 2018-03-08 NOTE — Telephone Encounter (Signed)
Spoke with pt he states he has been taking the medication twice a day for 5 days and feels doing it this way is not going to do anything. He would like to have 10 pills so he can take it the way he had been taking it all along. In the note above it looks like pt needed a 7 day supply #14 for each ABX. I resent the correct quantity for both and nothing further is needed.   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

## 2018-05-01 ENCOUNTER — Other Ambulatory Visit: Payer: Self-pay | Admitting: Pulmonary Disease

## 2018-05-01 DIAGNOSIS — J189 Pneumonia, unspecified organism: Secondary | ICD-10-CM

## 2018-05-01 MED ORDER — DOXYCYCLINE HYCLATE 100 MG PO TABS: ORAL_TABLET | ORAL | 2 refills | Status: DC

## 2018-05-28 ENCOUNTER — Ambulatory Visit (INDEPENDENT_AMBULATORY_CARE_PROVIDER_SITE_OTHER): Payer: Medicare Other | Admitting: Nurse Practitioner

## 2018-05-28 ENCOUNTER — Encounter: Payer: Self-pay | Admitting: Nurse Practitioner

## 2018-05-28 ENCOUNTER — Telehealth: Payer: Self-pay | Admitting: Pulmonary Disease

## 2018-05-28 VITALS — BP 102/60 | HR 129 | Ht 71.0 in | Wt 177.2 lb

## 2018-05-28 DIAGNOSIS — R509 Fever, unspecified: Secondary | ICD-10-CM | POA: Diagnosis not present

## 2018-05-28 DIAGNOSIS — J449 Chronic obstructive pulmonary disease, unspecified: Secondary | ICD-10-CM

## 2018-05-28 LAB — POCT INFLUENZA A/B
INFLUENZA A, POC: NEGATIVE
INFLUENZA B, POC: NEGATIVE

## 2018-05-28 MED ORDER — METHYLPREDNISOLONE ACETATE 80 MG/ML IJ SUSP
80.0000 mg | Freq: Once | INTRAMUSCULAR | Status: AC
  Administered 2018-05-28: 80 mg via INTRAMUSCULAR

## 2018-05-28 MED ORDER — LEVOFLOXACIN 750 MG PO TABS: 750.0000 mg | ORAL_TABLET | Freq: Every day | ORAL | 0 refills | Status: DC

## 2018-05-28 MED ORDER — PREDNISONE 10 MG PO TABS: ORAL_TABLET | ORAL | 0 refills | Status: DC

## 2018-05-28 NOTE — Telephone Encounter (Signed)
Called and spoke with Jeffrey Hines [facility]) which stated pt began to having body aches yesterday, 12/22. Pt now has rattling in lungs, body aches, coughing up green mucus, increased SOB, and also running a fever of 101.8.  While speaking with Jeffrey Hines, she stated an appt had been scheduled for pt tomorrow, 12/24 but due to his symptoms, she wanted to speak with a nurse in regards to this.  I stated to Jeffrey Hines we needed to get pt seen today and not have him wait until tomorrow. Jeffrey Hines expressed understanding. OV scheduled for pt this afternoon at 4:30 with TN.  Nothing further needed.

## 2018-05-28 NOTE — Patient Instructions (Addendum)
Patient does not want to go to the ED today or be admitted Will order stat chest x ray Will start Levaquin Will order prednisone taper DepoMedrol given in office today May take mucinex May take delsym Tylenol or motrin for fever Follow up in 1 week or sooner if needed If symptoms worsen please go to the ED

## 2018-05-28 NOTE — Progress Notes (Signed)
_0  ID: [name], male    DOB: 1945-11-04, 72 y.o.   MRN: [id number]  Chief Complaint  Patient presents with  . Cough    with green congestion    Referring provider: [facility]  Synopsis: Mr. Gieselman was diagnosed with squamous cell carcinoma of the left upper lung in 2016. He was hospitalized for Pseudomonas pneumonia at the same time. He started treatment with chemotherapy and radiation therapy in 2016. He also has COPD.  After chemo/radiation treatment of his left upper lobe malignancy he had a left upper lobectomy in the summer 2016. In the summer of 2018 he developed recurrent respiratory infections predominantly from the left lung. A bronchoscopy on 02/22/2017 was performed and showed tight stenosis in the left lower lobe. Biopsies were taken and were negative for malignancy.  He later went to [facility] and on March 16, 2017 he had a bronchoscopy with balloon dilation of a focal area of acquired bronchomalacia and stenosis of the left lower lobe orifice.  This is felt to be due to torsion of the lung post lobectomy.  HPI  72 year old male with chronic respiratory failure with hypoxia, COPD, postobstructive pneumonia due to Pseudomonas, Premasol, carcinoma of the lungs stage II followed by Dr. Lake Bells.   Tests: PFT: March 2016 pulmonary function test> ratio 50%, FEV1 L (49% predicted, 18% change with bronchodilator), total lung capacity 6.73 L (93% predicted), DLCO 11.13 (33% predicted). June 2016 pulmonary function testing ratio 46%, FEV1 2.47 L (71% Pred), total lung capacity 7.79 L (107% predicted), DLCO 10.62 (31% predicted). March 2018 simple spirometry test: Ratio 42%, FEV1 1.16 L 34% predicted, FVC 2.76 60% percent predicted  11/2014 ONO < 88% 2 hours 08/31/2016> Walked 500 feet on room air O2 saturation ranged between 91 and 94%   Micro: 03/16/2017 bronch wash/BAL> oropharyngeal fluid 02/22/2017 BAL bact/fung/AFB> all negative  OV 05/28/18 -  OV acute fever Patient presents with fever that started yesterday. States that he is having chest congestion and cough. Complains of body aches. He has taken motrin with minimal relief noted. His temperature was reported as 101.0 F this a.m., but he afebrile in office today. His sats were 86% when arriving to the office and returned to 93% when placed on O2 at 3L [place]. He has O2 at home to use when needed, but did not bring it today. Patient's flu swab in office today was negative.     Allergies  Allergen Reactions  . Prolixin [Fluphenazine]     Severe back pain  . Advair Diskus [Fluticasone-Salmeterol] Anxiety    Immunization History  Administered Date(s) Administered  . Influenza Inj Mdck Quad Pf 03/01/2018  . Influenza Split 04/14/2015  . Influenza Whole 02/27/2014  . Influenza, High Dose Seasonal PF 06/02/2016, 04/06/2017  . Pneumococcal Conjugate-13 04/14/2015  . Pneumococcal Polysaccharide-23 05/03/2016    Past Medical History:  Diagnosis Date  . Anxiety   . Cancer ([place])   . Chronic lower back pain   . Closed head injury 1998  . Constipation due to opioid therapy   . COPD (chronic obstructive pulmonary disease) ([place])   . COPD with chronic bronchitis ([place])   . Full dentures   . GERD (gastroesophageal reflux disease)   . Hemoptysis 08/03/2014  . Hypertension   . Multiple rib fractures 1998   left side  . On supplemental oxygen therapy    2L of O2 at night  . Post-obstruction pneumonia due to Pseudomonas aeruginosa 08/07/2014   Endobronchial mass causing LUL  obstruction  . Radiation 08/25/14-09/29/14   left upper central lung 45 Gy  . Seizures ([place])   . Shortness of breath dyspnea   . Shoulder dislocation 1998   left  . Spontaneous pneumothorax 08/03/2014   Left side 1st time episode spontaneous pneumothorax associated with acute flare of COPD   . Tobacco abuse     Tobacco History: Social History   Tobacco Use  Smoking Status Former Smoker  . Packs/day: 0.50  .  Years: 50.00  . Pack years: 25.00  . Types: Cigarettes  . Last attempt to quit: 08/03/2014  . Years since quitting: 3.8  Smokeless Tobacco Never Used   Counseling given: Not Answered   Outpatient Encounter Medications as of 05/28/2018  Medication Sig  . amoxicillin-clavulanate (AUGMENTIN) 875-125 MG tablet TAKE 1 TABLET BY MOUTH TWICE A DAY FIRST 5 DAYS OF EVERY OTHER MONTH (MAR, MAY, JULY)  . Bioflavonoid Products (BIOFLEX PO) Take 1 tablet by mouth 2 (two) times daily.   . clonazePAM (KLONOPIN) 0.5 MG tablet Take 0.5 mg by mouth 3 (three) times daily.  Marland Kitchen doxycycline (VIBRA-TABS) 100 MG tablet TAKE 1 TABLET BY MOUTH TWICE DAILY FIRST 7 DAYS OF EVERY OTHER MONTH (FEB, APR, JUNE)  . ibuprofen (ADVIL,MOTRIN) 200 MG tablet Take 400 mg by mouth 2 (two) times daily. Reported on 08/03/2015  . levofloxacin (LEVAQUIN) 750 MG tablet Take 1 tablet (750 mg total) by mouth daily.  Marland Kitchen loratadine (CLARITIN) 10 MG tablet Take 10 mg by mouth daily.  . predniSONE (DELTASONE) 10 MG tablet Take 4 tabs for 2 days, then 3 tabs for 2 days, then 2 tabs for 2 days, then 1 tab for 2 days, then stop  . PROVENTIL HFA 108 (90 BASE) MCG/ACT inhaler   . Respiratory Therapy Supplies (FLUTTER) DEVI 1 Device by Does not apply route as directed.  Marland Kitchen rOPINIRole (REQUIP) 0.25 MG tablet Take 1 tablet (0.25 mg total) by mouth at bedtime.  . SYMBICORT 160-4.5 MCG/ACT inhaler Inhale 2 puffs into the lungs 2 (two) times daily.  Marland Kitchen umeclidinium bromide (INCRUSE ELLIPTA) 62.5 MCG/INH AEPB Inhale 1 puff into the lungs daily.  . [EXPIRED] methylPREDNISolone acetate (DEPO-MEDROL) injection 80 mg    No facility-administered encounter medications on file as of 05/28/2018.      Review of Systems  Review of Systems  Constitutional: Positive for fever. Negative for chills.  HENT: Negative.   Respiratory: Positive for cough and shortness of breath. Negative for wheezing.   Cardiovascular: Negative.  Negative for chest pain,  palpitations and leg swelling.  Gastrointestinal: Negative.   Allergic/Immunologic: Negative.   Neurological: Negative.   Psychiatric/Behavioral: Negative.        Physical Exam  BP 102/60 (BP Location: Left Arm, Patient Position: Sitting, Cuff Size: Normal)   Pulse (!) 129   Ht _0  (1.803 m)   Wt 177 lb 3.2 oz (80.4 kg)   SpO2 93%   BMI 24.71 kg/m   Wt Readings from Last 5 Encounters:  05/28/18 177 lb 3.2 oz (80.4 kg)  03/02/18 179 lb 3.2 oz (81.3 kg)  02/28/18 179 lb 8 oz (81.4 kg)  08/28/17 177 lb 6.4 oz (80.5 kg)  06/20/17 175 lb (79.4 kg)     Physical Exam Vitals signs and nursing note reviewed.  Constitutional:      General: He is not in acute distress.    Appearance: He is well-developed. He is ill-appearing.  Cardiovascular:     Rate and Rhythm: Regular rhythm. Tachycardia present.  Pulmonary:  Effort: Pulmonary effort is normal.     Breath sounds: Normal breath sounds. No wheezing or rhonchi.  Musculoskeletal:        General: No swelling.  Skin:    General: Skin is warm and dry.  Neurological:     Mental Status: He is alert and oriented to person, place, and time.       Assessment & Plan:   COPD with chronic bronchitis ([place]) Flu swab in office was negative today Patient Instructions  Patient does not want to go to the ED today or be admitted Will order stat chest x ray Will start Levaquin Will order prednisone taper DepoMedrol given in office today May take mucinex May take delsym Tylenol or motrin for fever Follow up in 1 week or sooner if needed If symptoms worsen please go to the ED         Fenton Foy, NP 05/29/2018

## 2018-05-29 ENCOUNTER — Encounter: Payer: Self-pay | Admitting: Nurse Practitioner

## 2018-05-29 ENCOUNTER — Ambulatory Visit: Payer: Self-pay | Admitting: Nurse Practitioner

## 2018-05-29 ENCOUNTER — Ambulatory Visit (INDEPENDENT_AMBULATORY_CARE_PROVIDER_SITE_OTHER)
Admission: RE | Admit: 2018-05-29 | Discharge: 2018-05-29 | Disposition: A | Discharge status: Home / Self Care | Payer: Medicare Other | Source: Ambulatory Visit | Attending: Nurse Practitioner | Admitting: Nurse Practitioner

## 2018-05-29 DIAGNOSIS — R509 Fever, unspecified: Secondary | ICD-10-CM | POA: Diagnosis not present

## 2018-05-29 DIAGNOSIS — R0602 Shortness of breath: Secondary | ICD-10-CM | POA: Diagnosis not present

## 2018-05-29 DIAGNOSIS — R05 Cough: Secondary | ICD-10-CM | POA: Diagnosis not present

## 2018-05-29 NOTE — Assessment & Plan Note (Signed)
Flu swab in office was negative today Patient Instructions  Patient does not want to go to the ED today or be admitted Will order stat chest x ray Will start Levaquin Will order prednisone taper DepoMedrol given in office today May take mucinex May take delsym Tylenol or motrin for fever Follow up in 1 week or sooner if needed If symptoms worsen please go to the ED

## 2018-06-01 ENCOUNTER — Encounter: Payer: Self-pay | Admitting: Nurse Practitioner

## 2018-06-01 ENCOUNTER — Other Ambulatory Visit: Payer: Self-pay | Admitting: General Surgery

## 2018-06-01 ENCOUNTER — Ambulatory Visit (INDEPENDENT_AMBULATORY_CARE_PROVIDER_SITE_OTHER): Payer: Medicare Other | Admitting: Nurse Practitioner

## 2018-06-01 DIAGNOSIS — J9611 Chronic respiratory failure with hypoxia: Secondary | ICD-10-CM

## 2018-06-01 DIAGNOSIS — J449 Chronic obstructive pulmonary disease, unspecified: Secondary | ICD-10-CM | POA: Diagnosis not present

## 2018-06-01 NOTE — Assessment & Plan Note (Signed)
Patient Instructions  Glad to see you are doing well Continue current medications Continue mucinex Follow up with Dr. Lake Bells in 3 months or sooner if needed

## 2018-06-01 NOTE — Progress Notes (Signed)
_0  ID: Jeffrey Hines, male    DOB: 1946-03-03, 72 y.o.   MRN: [id number]  Chief Complaint  Patient presents with  . Follow-up    COPD with chronic bronchitis    Referring provider: [facility]   Synopsis: Jeffrey Hines was diagnosed with squamous cell carcinoma of the left upper lung in 2016. He was hospitalized for Pseudomonas pneumonia at the same time. He started treatment with chemotherapy and radiation therapy in 2016. He also has COPD. After chemo/radiation treatment of his left upper lobe malignancy he had a left upper lobectomy in the summer 2016. In the summer of 2018 he developed recurrent respiratory infections predominantly from the left lung. A bronchoscopy on 02/22/2017 was performed and showed tight stenosis in the left lower lobe. Biopsies were taken and were negative for malignancy. He later went to [facility] and on March 16, 2017 he had a bronchoscopy with balloon dilation of a focal area of acquired bronchomalacia and stenosis of the left lower lobe orifice. This is felt to be due to torsion of the lung post lobectomy.   HPI  72 year old male with chronic respiratory failure with hypoxia, COPD, postobstructive pneumonia due to Pseudomonas, Premasol, carcinoma of the lungs stage II followed by Dr. Lake Bells.   Tests:  CXR 05/29/18- Stable chest with left lung post treatment changes superimposed on chronic lung disease. No acute cardiopulmonary abnormality identified.  PFT: March 2016 pulmonary function test> ratio 50%, FEV1 L (49% predicted, 18% change with bronchodilator), total lung capacity 6.73 L (93% predicted), DLCO 11.13 (33% predicted). June 2016 pulmonary function testing ratio 46%, FEV1 2.47 L (71% Pred), total lung capacity 7.79 L (107% predicted), DLCO 10.62 (31% predicted). March 2018 simple spirometry test: Ratio 42%, FEV1 1.16 L 34% predicted, FVC 2.76 60% percent predicted  11/2014 ONO < 88% 2 hours 08/31/2016>Walked 500 feet  on room air O2 saturation ranged between 91 and 94%   Micro: 03/16/2017 bronch wash/BAL> oropharyngeal fluid 02/22/2017 BAL bact/fung/AFB> all negative  OV 06/01/18 - follow up Patient was last seen by me on 05/28/18 for fever and chest congestion. His sats were low in office and was doing poorly that day. His chest x ray did not show any pneumonia and flu swab was negative. He was started on levaquin and prednisone. He states that he is much improved today. Appears well today. He has O2 to use at home if oxygen levels drop. His vitals are stable today. He has not had a fever since last seen here in the office. He denies any chest pain or edema.    Allergies  Allergen Reactions  . Prolixin [Fluphenazine]     Severe back pain  . Advair Diskus [Fluticasone-Salmeterol] Anxiety    Immunization History  Administered Date(s) Administered  . Influenza Inj Mdck Quad Pf 03/01/2018  . Influenza Split 04/14/2015  . Influenza Whole 02/27/2014  . Influenza, High Dose Seasonal PF 06/02/2016, 04/06/2017  . Pneumococcal Conjugate-13 04/14/2015  . Pneumococcal Polysaccharide-23 05/03/2016    Past Medical History:  Diagnosis Date  . Anxiety   . Cancer ([place])   . Chronic lower back pain   . Closed head injury 1998  . Constipation due to opioid therapy   . COPD (chronic obstructive pulmonary disease) ([place])   . COPD with chronic bronchitis ([place])   . Full dentures   . GERD (gastroesophageal reflux disease)   . Hemoptysis 08/03/2014  . Hypertension   . Multiple rib fractures 1998   left side  . On  supplemental oxygen therapy    2L of O2 at night  . Post-obstruction pneumonia due to Pseudomonas aeruginosa 08/07/2014   Endobronchial mass causing LUL obstruction  . Radiation 08/25/14-09/29/14   left upper central lung 45 Gy  . Seizures ([place])   . Shortness of breath dyspnea   . Shoulder dislocation 1998   left  . Spontaneous pneumothorax 08/03/2014   Left side 1st time episode spontaneous  pneumothorax associated with acute flare of COPD   . Tobacco abuse     Tobacco History: Social History   Tobacco Use  Smoking Status Former Smoker  . Packs/day: 0.50  . Years: 50.00  . Pack years: 25.00  . Types: Cigarettes  . Last attempt to quit: 08/03/2014  . Years since quitting: 3.8  Smokeless Tobacco Never Used   Counseling given: Yes   Outpatient Encounter Medications as of 06/01/2018  Medication Sig  . amoxicillin-clavulanate (AUGMENTIN) 875-125 MG tablet TAKE 1 TABLET BY MOUTH TWICE A DAY FIRST 5 DAYS OF EVERY OTHER MONTH (MAR, MAY, JULY)  . Bioflavonoid Products (BIOFLEX PO) Take 1 tablet by mouth 2 (two) times daily.   . clonazePAM (KLONOPIN) 0.5 MG tablet Take 0.5 mg by mouth 3 (three) times daily.  Marland Kitchen doxycycline (VIBRA-TABS) 100 MG tablet TAKE 1 TABLET BY MOUTH TWICE DAILY FIRST 7 DAYS OF EVERY OTHER MONTH (FEB, APR, JUNE)  . ibuprofen (ADVIL,MOTRIN) 200 MG tablet Take 400 mg by mouth 2 (two) times daily. Reported on 08/03/2015  . levofloxacin (LEVAQUIN) 750 MG tablet Take 1 tablet (750 mg total) by mouth daily.  Marland Kitchen loratadine (CLARITIN) 10 MG tablet Take 10 mg by mouth daily.  . predniSONE (DELTASONE) 10 MG tablet Take 4 tabs for 2 days, then 3 tabs for 2 days, then 2 tabs for 2 days, then 1 tab for 2 days, then stop  . PROVENTIL HFA 108 (90 BASE) MCG/ACT inhaler   . Respiratory Therapy Supplies (FLUTTER) DEVI 1 Device by Does not apply route as directed.  Marland Kitchen rOPINIRole (REQUIP) 0.25 MG tablet Take 1 tablet (0.25 mg total) by mouth at bedtime.  . SYMBICORT 160-4.5 MCG/ACT inhaler Inhale 2 puffs into the lungs 2 (two) times daily.  Marland Kitchen umeclidinium bromide (INCRUSE ELLIPTA) 62.5 MCG/INH AEPB Inhale 1 puff into the lungs daily.   No facility-administered encounter medications on file as of 06/01/2018.      Review of Systems  Review of Systems  Constitutional: Negative.  Negative for chills and fever.  HENT: Negative.  Negative for congestion.   Respiratory: Positive  for cough and shortness of breath. Negative for wheezing.   Cardiovascular: Negative.  Negative for chest pain, palpitations and leg swelling.  Gastrointestinal: Negative.   Allergic/Immunologic: Negative.   Neurological: Negative.   Psychiatric/Behavioral: Negative.        Physical Exam  BP 134/70 (BP Location: Left Arm, Patient Position: Sitting, Cuff Size: Normal)   Pulse 72   Ht _0  (1.803 m)   Wt 183 lb (83 kg)   SpO2 95%   BMI 25.52 kg/m   Wt Readings from Last 5 Encounters:  06/01/18 183 lb (83 kg)  05/28/18 177 lb 3.2 oz (80.4 kg)  03/02/18 179 lb 3.2 oz (81.3 kg)  02/28/18 179 lb 8 oz (81.4 kg)  08/28/17 177 lb 6.4 oz (80.5 kg)     Physical Exam Vitals signs and nursing note reviewed.  Constitutional:      General: He is not in acute distress.    Appearance: He is well-developed.  Cardiovascular:     Rate and Rhythm: Normal rate and regular rhythm.  Pulmonary:     Effort: Pulmonary effort is normal. No respiratory distress.     Breath sounds: Normal breath sounds. No wheezing or rhonchi.  Musculoskeletal:        General: No swelling.  Skin:    General: Skin is warm and dry.  Neurological:     Mental Status: He is alert and oriented to person, place, and time.     Imaging: Dg Chest 2 View  Result Date: 05/29/2018 CLINICAL DATA:  72 year old male with productive cough. Wheezing shortness of breath and fever for 2 days. History of treated lung cancer. EXAM: CHEST - 2 VIEW COMPARISON:  Restaging chest CT 02/23/2018 and earlier. FINDINGS: Sequelae of partial left pneumonectomy with superimposed bronchiectasis and architectural distortion in the residual left lung. Compared to chest radiographs 05/05/2017 left lung ventilation is improved, and appears stable to that in September. Stable leftward shift of the mediastinum and mediastinal contours. Stable coarse interstitial opacity in the right lung since 2018. No acute pulmonary opacity. Stable tracheal air  column. No acute osseous abnormality identified. Negative visible bowel gas pattern. IMPRESSION: Stable chest with left lung post treatment changes superimposed on chronic lung disease. No acute cardiopulmonary abnormality identified. Electronically Signed   By: Genevie Ann M.D.   On: 05/29/2018 09:01     Assessment & Plan:   COPD with chronic bronchitis ([place]) Patient Instructions  Glad to see you are doing well Continue current medications Continue mucinex Follow up with Dr. Lake Bells in 3 months or sooner if needed    Chronic respiratory failure with hypoxia ([place]) Continue O2 as needed at home     Fenton Foy, NP 06/01/2018

## 2018-06-01 NOTE — Assessment & Plan Note (Signed)
Continue O2 as needed at home

## 2018-06-01 NOTE — Patient Instructions (Addendum)
Glad to see you are doing well Continue current medications Continue mucinex Follow up with Dr. Lake Bells in 3 months or sooner if needed

## 2018-06-05 ENCOUNTER — Ambulatory Visit: Payer: Self-pay | Admitting: Nurse Practitioner

## 2018-06-06 NOTE — Progress Notes (Signed)
Reviewed, agree 

## 2018-08-20 ENCOUNTER — Telehealth: Payer: Self-pay | Admitting: Pulmonary Disease

## 2018-08-20 MED ORDER — LEVOFLOXACIN 500 MG PO TABS: 500.0000 mg | ORAL_TABLET | Freq: Every day | ORAL | 0 refills | Status: DC

## 2018-08-20 NOTE — Telephone Encounter (Signed)
It sounds like he is having bronchiectatic flare .   Has he had any travel , exposure to possible people that have traveled or been in crowds that would place him at risk for COVID 19  Is he able to eat and drink ?  I tried to call pt and goes straight to voice mail .   Best option is to bring in for sick visit and I can see him with resp/contact isolation   If he declines Could use Levaquin 500mg  daily #7 , take with food  Take probiotic daily  mucinex Twice daily As needed  Cough/congestion  Flutter vavle Three times a day  .  Fluids and rest  Tylenol for fever As needed     If not improving or worsens with fever , not eating will need to be seen or go to ER .   Please contact office for sooner follow up if symptoms do not improve or worsen or seek emergency care        Coronavirus (COVID-19) Are you at risk?  Are you at risk for the Coronavirus (COVID-19)?  To be considered HIGH RISK for Coronavirus (COVID-19), you have to meet the following criteria:  . Traveled to [place], [place], [place], [place] or [place]; or in the [place] to [place], [place], [place], or [place]; and have fever, cough, and shortness of breath within the last 2 weeks of travel OR . Been in close contact with a person diagnosed with COVID-19 within the last 2 weeks and have fever, cough, and shortness of breath . IF YOU DO NOT MEET THESE CRITERIA, YOU ARE CONSIDERED LOW RISK FOR COVID-19.  What to do if you are HIGH RISK for COVID-19?  Marland Kitchen If you are having a medical emergency, call 911. . Seek medical care right away. Before you go to a doctor's office, urgent care or emergency department, call ahead and tell them about your recent travel, contact with someone diagnosed with COVID-19, and your symptoms. You should receive instructions from your physician's office regarding next steps of care.  . When you arrive at healthcare provider, tell the healthcare staff immediately you have returned  from visiting [place], [place], [place], [place] or [place]; or traveled in the [place] to [place], [place], [place], or [place]; in the last two weeks or you have been in close contact with a person diagnosed with COVID-19 in the last 2 weeks.   . Tell the health care staff about your symptoms: fever, cough and shortness of breath. . After you have been seen by a medical provider, you will be either: o Tested for (COVID-19) and discharged home on quarantine except to seek medical care if symptoms worsen, and asked to  - Stay home and avoid contact with others until you get your results (4-5 days)  - Avoid travel on public transportation if possible (such as bus, train, or airplane) or o Sent to the Emergency Department by EMS for evaluation, COVID-19 testing, and possible admission depending on your condition and test results.  What to do if you are LOW RISK for COVID-19?  Reduce your risk of any infection by using the same precautions used for avoiding the common cold or flu:  Marland Kitchen Wash your hands often with soap and warm water for at least 20 seconds.  If soap and water are not readily available, use an alcohol-based hand sanitizer with at least 60% alcohol.  . If coughing or sneezing, cover your  mouth and nose by coughing or sneezing into the elbow areas of your shirt or coat, into a tissue or into your sleeve (not your hands). . Avoid shaking hands with others and consider head nods or verbal greetings only. . Avoid touching your eyes, nose, or mouth with unwashed hands.  . Avoid close contact with people who are sick. . Avoid places or events with large numbers of people in one location, like concerts or sporting events. . Carefully consider travel plans you have or are making. . If you are planning any travel outside or inside the [name], visit the CDC's Travelers' Health webpage for the latest health notices. . If you have some symptoms but not all symptoms, continue to monitor at  home and seek medical attention if your symptoms worsen. . If you are having a medical emergency, call 911.   [place] / e-Visit: [email or web address]         [facility]: [facility]: [phone]                   [facility]: [phone]

## 2018-08-20 NOTE — Telephone Encounter (Signed)
Called and spoke with patient regarding TP recommendations below.  Signed     It sounds like he is having bronchiectatic flare .   Has he had any travel , exposure to possible people that have traveled or been in crowds that would place him at risk for COVID 19  Is he able to eat and drink ?  I tried to call pt and goes straight to voice mail .   Best option is to bring in for sick visit and I can see him with resp/contact isolation   If he declines Could use Levaquin 500mg  daily #7 , take with food  Take probiotic daily  mucinex Twice daily As needed  Cough/congestion  Flutter vavle Three times a day  .  Fluids and rest  Tylenol for fever As needed     If not improving or worsens with fever , not eating will need to be seen or go to ER .   Please contact office for sooner follow up if symptoms do not improve or worsen or seek emergency care          Patient advised he is eating and drinking, and denied coming into the office this week due to the covid-19 virus. Advised patient that TP recommended Levaquin 500mg  for 7 days  Patient verbilized that this was okay to be called in today [facility] in [place], [place] Pt states that he will not use the flutter valve, and is not taking Tylenol. Advised pt twice during the phone call all of TP recommendations Advised pt that if he doesn't improve, to seek ER or call EMS; also advised him he can use E-Visit as well. Pt verbalized understanding, had no further concerns Placed order to  Pharmacy for Levaquin 500mg  x7days Nothing further needed.

## 2018-08-20 NOTE — Telephone Encounter (Signed)
Routing to [facility] Triage due to this being a [facility] patient.

## 2018-08-20 NOTE — Telephone Encounter (Signed)
Primary Pulmonologist: Dr. Lake Bells Last office visit and with whom: 06/01/18 with Kenney Houseman What do we see them for (pulmonary problems): COPD bronchiectasis  Last OV assessment/plan: Return in about 4 months (around 07/02/2018).  Restless leg syndrome (new problem): Start taking Requip 1 hour before bedtime Initial: 0.25 mg once daily 1 to 3 hours before bedtime. Dose may be increased after 2 days to 0.5 mg daily, and after 7 days to 1 mg daily. Dose may be further titrated upward in 0.5 mg increments every week until reaching a daily dose of 3 mg during week 6. Daily dose may be increased to a maximum of 4 mg beginning week 7.  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  Not offering appts to sick patients per protocol.    Reason for call: Spoke with patient's spouse Dr. Rowe Robert. She stated that the patient has been experiencing increased SOB with walking, productive cough with clear mucus, increased fatigue for the past week. She took his temp yesterday and it was 100F. She checked his O2 yesterday and it was around the 88% mark, usually it is in the mid 90s.   He is currently using his Symbicort and albuterol inhalers. The OTC medication he is taking is ibuprofen for the fever.   Per his spouse[facility] [place]   TP, please advise since BQ is not in the office today.

## 2018-08-24 ENCOUNTER — Telehealth: Payer: Self-pay | Admitting: Pulmonary Disease

## 2018-08-24 NOTE — Telephone Encounter (Signed)
Spoke with the pt and scheduled his f/u with BQ for 10/05/2018 at 2 pm  He states he may call back to reschedule this depending on what is going on with the novel virus at that time  Nothing further needed

## 2018-08-28 ENCOUNTER — Inpatient Hospital Stay: Payer: Medicare Other

## 2018-08-30 ENCOUNTER — Ambulatory Visit: Payer: Self-pay | Admitting: Internal Medicine

## 2018-09-07 ENCOUNTER — Other Ambulatory Visit: Payer: Self-pay

## 2018-09-07 ENCOUNTER — Ambulatory Visit (INDEPENDENT_AMBULATORY_CARE_PROVIDER_SITE_OTHER): Payer: Medicare Other | Admitting: Nurse Practitioner

## 2018-09-07 ENCOUNTER — Encounter: Payer: Self-pay | Admitting: Nurse Practitioner

## 2018-09-07 DIAGNOSIS — G2581 Restless legs syndrome: Secondary | ICD-10-CM | POA: Diagnosis not present

## 2018-09-07 MED ORDER — ROPINIROLE HCL 0.5 MG PO TABS: 0.5000 mg | ORAL_TABLET | Freq: Every day | ORAL | 0 refills | Status: DC

## 2018-09-07 NOTE — Patient Instructions (Signed)
Continue Requip 1 hour before bedtime Initial: 0.5 mg daily, and after 7 days to 1 mg daily. Dose may be further titrated upward in 0.5 mg increments every week until reaching a daily dose of 3 mg during week 6. Daily dose may be increased to a maximum of 4 mg beginning week 7.  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

## 2018-09-07 NOTE — Progress Notes (Signed)
Virtual Visit via Telephone Note  I connected with Jeffrey Hines on 09/07/18 at  3:00 PM EDT by telephone and verified that I am speaking with the correct person using two identifiers.   I discussed the limitations, risks, security and privacy concerns of performing an evaluation and management service by telephone and the availability of in person appointments. I also discussed with the patient that there may be a patient responsible charge related to this service. The patient expressed understanding and agreed to proceed.   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 Lake Bells.   Patient is a tele-visit today for a follow-up.  He is followed up today on Requip.  He states that he has been doing well at 0.5 mg at night.  He is requesting his prescription be changed from 0.25 to 0.5 mg at night.  While he has been doing well since his last visit.  This is been a stable interval for him.  Compliant with medications.Denies f/c/s, n/v/d, hemoptysis, PND, leg swelling.     Observations/Objective: Chest imaging:  March 2018 CT chest: Status post left upper lobectomy, some mild centrilobular emphysema, mild fibrotic change right lower lobe, bronchovascular distribution question aspiration, images independently reviewed September 2018 CT chest images independently reviewed showing that he is status post left lower lobectomy, there is significant nodular consolidation and tree-in-bud abnormalities in the left lung, some mucus plugging.  Pathology report: September 2018 left mainstem bronchus biopsy: Negative for malignancy, fibrotic changes noted, cytology from brushing negative  Microbiology: September 19 wash and brushing: Multiple species present, none predominant , fungus and AFB pending  Assessment and Plan: Discussion: Patient is  following up today on Requip/restless leg syndrome.  He states that he has been doing well at 0.5 mg at night.  He is requesting his prescription be changed from 0.25 to 0.5 mg at night.  While he has been doing well since his last visit.  This is been a stable interval for him.  Compliant with medications.  Patient Instructions  Continue Requip 1 hour before bedtime Initial: 0.5 mg daily, and after 7 days to 1 mg daily. Dose may be further titrated upward in 0.5 mg increments every week until reaching a daily dose of 3 mg during week 6. Daily dose may be increased to a maximum of 4 mg beginning week 7.  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 The patient was provided an opportunity to ask questions and all were answered. The patient agreed with the plan and demonstrated an understanding of the instructions.   The patient was advised to call back or seek an in-person evaluation if the symptoms worsen or if the condition fails to improve as anticipated.  I provided 22 minutes of non-face-to-face time during this encounter.   Fenton Foy, NP

## 2018-09-07 NOTE — Assessment & Plan Note (Signed)
Discussion: Patient is following up today on Requip/restless leg syndrome.  He states that he has been doing well at 0.5 mg at night.  He is requesting his prescription be changed from 0.25 to 0.5 mg at night.  While he has been doing well since his last visit.  This is been a stable interval for him.  Compliant with medications.  Patient Instructions  Continue Requip 1 hour before bedtime Initial: 0.5 mg daily, and after 7 days to 1 mg daily. Dose may be further titrated upward in 0.5 mg increments every week until reaching a daily dose of 3 mg during week 6. Daily dose may be increased to a maximum of 4 mg beginning week 7.  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

## 2018-09-08 NOTE — Progress Notes (Signed)
Reviewed, agree 

## 2018-09-15 ENCOUNTER — Other Ambulatory Visit: Payer: Self-pay | Admitting: Pulmonary Disease

## 2018-09-15 DIAGNOSIS — J189 Pneumonia, unspecified organism: Secondary | ICD-10-CM

## 2018-09-17 ENCOUNTER — Ambulatory Visit: Payer: Self-pay | Admitting: Pulmonary Disease

## 2018-09-20 ENCOUNTER — Telehealth: Payer: Self-pay

## 2018-09-20 NOTE — Telephone Encounter (Signed)
I think the inogen POC is one of the most light weight POC's. Does he feel like he's having an exacerbation? He may need a tele-visit.

## 2018-09-20 NOTE — Telephone Encounter (Signed)
Attempted to call pt but unable to reach. Left message for pt to return call. 

## 2018-09-20 NOTE — Telephone Encounter (Signed)
Called and spoke with pt. Pt stated he currently has an Inogen that he purchased himself. Pt is wanting to know if he could be established with a DME to be able to receive a lighter weight POC for him to use with exertion as he has been having more problems with SOB. Pt stated yesterday, 4/15 at rest sats were 90% on room air but then after going to the garage and then back to the house, by the time he had returned back to the house, he was "huffing and puffing" and sats were at 76%.  Pt stated today his sats have been ranging 88-90 on room air and then he has been putting O2 on at 3L which has gotten sats up to range of 95-96.  Due to this and with pt wanting a lighter POC and seeming like we need to get pt to come into the office for a qualifying walk so we know exactly how many liters pulsed for [name] to order for pt, Tonya, please advise if we need to schedule pt a face-to-face OV with qualifying walk at that [place]. Thanks!

## 2018-09-21 NOTE — Telephone Encounter (Signed)
Attempted to call pt but unable to reach.left message for pt to return call. 

## 2018-09-24 NOTE — Telephone Encounter (Signed)
Attempted to call pt but unable to reach. Left message for pt to return call. 

## 2018-09-25 NOTE — Telephone Encounter (Signed)
Called and spoke with pt in regards to the info stated by [place] and pt stated he was going to take care of the Inogen POC himself and upgrade from the current Inogen that he has as he does. Asked pt if he believed that he might have had an exacerbation the other day when I spoke with him on the phone and pt stated he feels much better now compared to 5 days ago and stated he believed that it was just that day he was not feeling well. Stated to pt if he began to have worsening symptoms again to call office and we might need to schedule him a televisit. Pt expressed understanding. Nothing further needed.

## 2018-09-29 ENCOUNTER — Other Ambulatory Visit: Payer: Self-pay | Admitting: Nurse Practitioner

## 2018-10-04 ENCOUNTER — Telehealth: Payer: Self-pay | Admitting: Medical Oncology

## 2018-10-04 ENCOUNTER — Telehealth: Payer: Self-pay | Admitting: Internal Medicine

## 2018-10-04 NOTE — Telephone Encounter (Signed)
Received two schedule messages 4/30, one to schedule patient for lab before 5/4 f/u with MM and another to schedule lab prior to ct and MM a few days later.   Patient currently on schedule for MM 5/4 - no ct scan on schedule. Spoke with desk nurse and per desk nurse added lab for next week and moved f/u with MM to week of 5/11. Per desk nurse she sent a message to central radiology re scheduling ct next week.  Scheduled patient for lab 5/5 and MM 5/14. I also sent a message to central radiology re scheduling scan for next week.  Left message for patient re new appointment dates/times and information for patient to follow up with central radiology re scan. Schedule mailed.

## 2018-10-04 NOTE — Telephone Encounter (Signed)
Pt called and said to cancel his appt fro tomorrow. . Done and schedule message sent to r/s lab, CT and f/u visit.

## 2018-10-05 ENCOUNTER — Ambulatory Visit: Payer: Self-pay | Admitting: Pulmonary Disease

## 2018-10-05 ENCOUNTER — Inpatient Hospital Stay: Payer: Medicare Other

## 2018-10-06 ENCOUNTER — Inpatient Hospital Stay (HOSPITAL_COMMUNITY)
Admission: EM | Admit: 2018-10-06 | Discharge: 2018-10-10 | DRG: 871 | Disposition: A | Discharge status: Home / Self Care | Payer: Medicare Other | Attending: Family Medicine | Admitting: Family Medicine

## 2018-10-06 ENCOUNTER — Emergency Department (HOSPITAL_COMMUNITY): Payer: Medicare Other

## 2018-10-06 ENCOUNTER — Encounter (HOSPITAL_COMMUNITY): Payer: Self-pay | Admitting: Emergency Medicine

## 2018-10-06 ENCOUNTER — Other Ambulatory Visit: Payer: Self-pay

## 2018-10-06 DIAGNOSIS — A419 Sepsis, unspecified organism: Principal | ICD-10-CM | POA: Diagnosis present

## 2018-10-06 DIAGNOSIS — Z85118 Personal history of other malignant neoplasm of bronchus and lung: Secondary | ICD-10-CM

## 2018-10-06 DIAGNOSIS — R0602 Shortness of breath: Secondary | ICD-10-CM | POA: Diagnosis not present

## 2018-10-06 DIAGNOSIS — Z888 Allergy status to other drugs, medicaments and biological substances status: Secondary | ICD-10-CM

## 2018-10-06 DIAGNOSIS — Z791 Long term (current) use of non-steroidal anti-inflammatories (NSAID): Secondary | ICD-10-CM

## 2018-10-06 DIAGNOSIS — Z902 Acquired absence of lung [part of]: Secondary | ICD-10-CM

## 2018-10-06 DIAGNOSIS — Z801 Family history of malignant neoplasm of trachea, bronchus and lung: Secondary | ICD-10-CM

## 2018-10-06 DIAGNOSIS — Z20828 Contact with and (suspected) exposure to other viral communicable diseases: Secondary | ICD-10-CM | POA: Diagnosis not present

## 2018-10-06 DIAGNOSIS — Z87891 Personal history of nicotine dependence: Secondary | ICD-10-CM

## 2018-10-06 DIAGNOSIS — J189 Pneumonia, unspecified organism: Secondary | ICD-10-CM

## 2018-10-06 DIAGNOSIS — Z820 Family history of epilepsy and other diseases of the nervous system: Secondary | ICD-10-CM

## 2018-10-06 DIAGNOSIS — J441 Chronic obstructive pulmonary disease with (acute) exacerbation: Secondary | ICD-10-CM | POA: Diagnosis present

## 2018-10-06 DIAGNOSIS — J151 Pneumonia due to Pseudomonas: Secondary | ICD-10-CM | POA: Diagnosis not present

## 2018-10-06 DIAGNOSIS — M545 Low back pain: Secondary | ICD-10-CM | POA: Diagnosis present

## 2018-10-06 DIAGNOSIS — G8929 Other chronic pain: Secondary | ICD-10-CM | POA: Diagnosis present

## 2018-10-06 DIAGNOSIS — J9621 Acute and chronic respiratory failure with hypoxia: Secondary | ICD-10-CM | POA: Diagnosis not present

## 2018-10-06 DIAGNOSIS — R0902 Hypoxemia: Secondary | ICD-10-CM | POA: Diagnosis present

## 2018-10-06 DIAGNOSIS — Z9221 Personal history of antineoplastic chemotherapy: Secondary | ICD-10-CM

## 2018-10-06 DIAGNOSIS — C3490 Malignant neoplasm of unspecified part of unspecified bronchus or lung: Secondary | ICD-10-CM | POA: Diagnosis not present

## 2018-10-06 DIAGNOSIS — K219 Gastro-esophageal reflux disease without esophagitis: Secondary | ICD-10-CM | POA: Diagnosis present

## 2018-10-06 DIAGNOSIS — Z03818 Encounter for observation for suspected exposure to other biological agents ruled out: Secondary | ICD-10-CM | POA: Diagnosis not present

## 2018-10-06 DIAGNOSIS — Z792 Long term (current) use of antibiotics: Secondary | ICD-10-CM

## 2018-10-06 DIAGNOSIS — J440 Chronic obstructive pulmonary disease with acute lower respiratory infection: Secondary | ICD-10-CM | POA: Diagnosis present

## 2018-10-06 DIAGNOSIS — I10 Essential (primary) hypertension: Secondary | ICD-10-CM | POA: Diagnosis present

## 2018-10-06 DIAGNOSIS — F419 Anxiety disorder, unspecified: Secondary | ICD-10-CM | POA: Diagnosis present

## 2018-10-06 DIAGNOSIS — G2581 Restless legs syndrome: Secondary | ICD-10-CM | POA: Diagnosis present

## 2018-10-06 LAB — COMPREHENSIVE METABOLIC PANEL
ALT: 18 U/L (ref 0–44)
AST: 24 U/L (ref 15–41)
Albumin: 4 g/dL (ref 3.5–5.0)
Alkaline Phosphatase: 83 U/L (ref 38–126)
Anion gap: 11 (ref 5–15)
BUN: 15 mg/dL (ref 8–23)
CO2: 25 mmol/L (ref 22–32)
Calcium: 8.8 mg/dL — ABNORMAL LOW (ref 8.9–10.3)
Chloride: 98 mmol/L (ref 98–111)
Creatinine, Ser: 1.05 mg/dL (ref 0.61–1.24)
GFR calc Af Amer: >60 mL/min (ref >60)
GFR calc non Af Amer: >60 mL/min (ref >60)
Glucose, Bld: 116 mg/dL — ABNORMAL HIGH (ref 70–99)
Potassium: 3.7 mmol/L (ref 3.5–5.1)
Sodium: 134 mmol/L — ABNORMAL LOW (ref 135–145)
Total Bilirubin: 1.2 mg/dL (ref 0.3–1.2)
Total Protein: 8.1 g/dL (ref 6.5–8.1)

## 2018-10-06 LAB — CBC WITH DIFFERENTIAL/PLATELET
Abs Immature Granulocytes: 0.05 10*3/uL (ref 0.00–0.07)
Basophils Absolute: 0 10*3/uL (ref 0.0–0.1)
Basophils Relative: 0 %
Eosinophils Absolute: 0.1 10*3/uL (ref 0.0–0.5)
Eosinophils Relative: 0 %
HCT: 45.7 % (ref 39.0–52.0)
Hemoglobin: 14.7 g/dL (ref 13.0–17.0)
Immature Granulocytes: 0 %
Lymphocytes Relative: 3 %
Lymphs Abs: 0.5 10*3/uL — ABNORMAL LOW (ref 0.7–4.0)
MCH: 28.7 pg (ref 26.0–34.0)
MCHC: 32.2 g/dL (ref 30.0–36.0)
MCV: 89.3 fL (ref 80.0–100.0)
Monocytes Absolute: 1.3 10*3/uL — ABNORMAL HIGH (ref 0.1–1.0)
Monocytes Relative: 9 %
Neutro Abs: 12.2 10*3/uL — ABNORMAL HIGH (ref 1.7–7.7)
Neutrophils Relative %: 88 %
Platelets: 210 10*3/uL (ref 150–400)
RBC: 5.12 MIL/uL (ref 4.22–5.81)
RDW: 12.4 % (ref 11.5–15.5)
WBC: 14.1 10*3/uL — ABNORMAL HIGH (ref 4.0–10.5)
nRBC: 0 % (ref 0.0–0.2)

## 2018-10-06 LAB — LACTIC ACID, PLASMA: Lactic Acid, Venous: 1.6 mmol/L (ref 0.5–1.9)

## 2018-10-06 LAB — LACTATE DEHYDROGENASE: LDH: 194 U/L — ABNORMAL HIGH (ref 98–192)

## 2018-10-06 LAB — FERRITIN: Ferritin: 237 ng/mL (ref 24–336)

## 2018-10-06 LAB — D-DIMER, QUANTITATIVE: D-Dimer, Quant: 1.22 ug/mL-FEU — ABNORMAL HIGH (ref 0.00–0.50)

## 2018-10-06 LAB — FIBRINOGEN: Fibrinogen: 568 mg/dL — ABNORMAL HIGH (ref 210–475)

## 2018-10-06 LAB — C-REACTIVE PROTEIN: CRP: 11.4 mg/dL — ABNORMAL HIGH (ref <1.0)

## 2018-10-06 LAB — PROCALCITONIN: Procalcitonin: <0.1 ng/mL

## 2018-10-06 LAB — TRIGLYCERIDES: Triglycerides: 34 mg/dL (ref <150)

## 2018-10-06 MED ORDER — ACETAMINOPHEN 500 MG PO TABS: 1000.0000 mg | ORAL_TABLET | Freq: Once | ORAL | Status: DC

## 2018-10-06 MED ORDER — IBUPROFEN 400 MG PO TABS
600.0000 mg | ORAL_TABLET | Freq: Once | ORAL | Status: AC
  Administered 2018-10-06: 600 mg via ORAL
  Filled 2018-10-06: qty 1

## 2018-10-06 MED ORDER — AZITHROMYCIN 250 MG PO TABS
500.0000 mg | ORAL_TABLET | Freq: Once | ORAL | Status: AC
  Administered 2018-10-06: 500 mg via ORAL
  Filled 2018-10-06: qty 2

## 2018-10-06 MED ORDER — SODIUM CHLORIDE 0.9 % IV SOLN
2.0000 g | INTRAVENOUS | Status: DC
  Administered 2018-10-06: 2 g via INTRAVENOUS
  Filled 2018-10-06: qty 20

## 2018-10-06 MED ORDER — SODIUM CHLORIDE 0.9 % IV BOLUS
1000.0000 mL | Freq: Once | INTRAVENOUS | Status: AC
  Administered 2018-10-06: 1000 mL via INTRAVENOUS

## 2018-10-06 NOTE — ED Triage Notes (Signed)
Reports having COPD and frequently taking antibiotics for infection that seems to not go away.  C/O fever and SOB currently.  Max fever 104.  Currently 100.6.
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
## 2018-10-07 ENCOUNTER — Other Ambulatory Visit: Payer: Self-pay

## 2018-10-07 DIAGNOSIS — M545 Low back pain: Secondary | ICD-10-CM | POA: Diagnosis present

## 2018-10-07 DIAGNOSIS — Z20828 Contact with and (suspected) exposure to other viral communicable diseases: Secondary | ICD-10-CM | POA: Diagnosis present

## 2018-10-07 DIAGNOSIS — A419 Sepsis, unspecified organism: Secondary | ICD-10-CM | POA: Diagnosis not present

## 2018-10-07 DIAGNOSIS — Z9221 Personal history of antineoplastic chemotherapy: Secondary | ICD-10-CM | POA: Diagnosis not present

## 2018-10-07 DIAGNOSIS — J440 Chronic obstructive pulmonary disease with acute lower respiratory infection: Secondary | ICD-10-CM | POA: Diagnosis present

## 2018-10-07 DIAGNOSIS — Z87891 Personal history of nicotine dependence: Secondary | ICD-10-CM | POA: Diagnosis not present

## 2018-10-07 DIAGNOSIS — J189 Pneumonia, unspecified organism: Secondary | ICD-10-CM | POA: Diagnosis not present

## 2018-10-07 DIAGNOSIS — Z792 Long term (current) use of antibiotics: Secondary | ICD-10-CM | POA: Diagnosis not present

## 2018-10-07 DIAGNOSIS — K219 Gastro-esophageal reflux disease without esophagitis: Secondary | ICD-10-CM | POA: Diagnosis present

## 2018-10-07 DIAGNOSIS — J151 Pneumonia due to Pseudomonas: Secondary | ICD-10-CM | POA: Diagnosis not present

## 2018-10-07 DIAGNOSIS — C3490 Malignant neoplasm of unspecified part of unspecified bronchus or lung: Secondary | ICD-10-CM | POA: Diagnosis present

## 2018-10-07 DIAGNOSIS — Z902 Acquired absence of lung [part of]: Secondary | ICD-10-CM | POA: Diagnosis not present

## 2018-10-07 DIAGNOSIS — Z801 Family history of malignant neoplasm of trachea, bronchus and lung: Secondary | ICD-10-CM | POA: Diagnosis not present

## 2018-10-07 DIAGNOSIS — I10 Essential (primary) hypertension: Secondary | ICD-10-CM | POA: Diagnosis present

## 2018-10-07 DIAGNOSIS — J441 Chronic obstructive pulmonary disease with (acute) exacerbation: Secondary | ICD-10-CM | POA: Diagnosis present

## 2018-10-07 DIAGNOSIS — R0902 Hypoxemia: Secondary | ICD-10-CM | POA: Diagnosis present

## 2018-10-07 DIAGNOSIS — G8929 Other chronic pain: Secondary | ICD-10-CM | POA: Diagnosis present

## 2018-10-07 DIAGNOSIS — Z888 Allergy status to other drugs, medicaments and biological substances status: Secondary | ICD-10-CM | POA: Diagnosis not present

## 2018-10-07 DIAGNOSIS — F419 Anxiety disorder, unspecified: Secondary | ICD-10-CM | POA: Diagnosis present

## 2018-10-07 DIAGNOSIS — Z85118 Personal history of other malignant neoplasm of bronchus and lung: Secondary | ICD-10-CM | POA: Diagnosis not present

## 2018-10-07 DIAGNOSIS — Z791 Long term (current) use of non-steroidal anti-inflammatories (NSAID): Secondary | ICD-10-CM | POA: Diagnosis not present

## 2018-10-07 DIAGNOSIS — G2581 Restless legs syndrome: Secondary | ICD-10-CM | POA: Diagnosis present

## 2018-10-07 DIAGNOSIS — J9621 Acute and chronic respiratory failure with hypoxia: Secondary | ICD-10-CM | POA: Diagnosis present

## 2018-10-07 DIAGNOSIS — J9601 Acute respiratory failure with hypoxia: Secondary | ICD-10-CM | POA: Diagnosis not present

## 2018-10-07 DIAGNOSIS — Z820 Family history of epilepsy and other diseases of the nervous system: Secondary | ICD-10-CM | POA: Diagnosis not present

## 2018-10-07 LAB — CBC
HCT: 37.7 % — ABNORMAL LOW (ref 39.0–52.0)
Hemoglobin: 12.2 g/dL — ABNORMAL LOW (ref 13.0–17.0)
MCH: 28.9 pg (ref 26.0–34.0)
MCHC: 32.4 g/dL (ref 30.0–36.0)
MCV: 89.3 fL (ref 80.0–100.0)
Platelets: 179 10*3/uL (ref 150–400)
RBC: 4.22 MIL/uL (ref 4.22–5.81)
RDW: 12.7 % (ref 11.5–15.5)
WBC: 13.7 10*3/uL — ABNORMAL HIGH (ref 4.0–10.5)
nRBC: 0 % (ref 0.0–0.2)

## 2018-10-07 LAB — BASIC METABOLIC PANEL
Anion gap: 15 (ref 5–15)
BUN: 12 mg/dL (ref 8–23)
CO2: 21 mmol/L — ABNORMAL LOW (ref 22–32)
Calcium: 8.3 mg/dL — ABNORMAL LOW (ref 8.9–10.3)
Chloride: 106 mmol/L (ref 98–111)
Creatinine, Ser: 0.78 mg/dL (ref 0.61–1.24)
GFR calc Af Amer: >60 mL/min (ref >60)
GFR calc non Af Amer: >60 mL/min (ref >60)
Glucose, Bld: 91 mg/dL (ref 70–99)
Potassium: 4 mmol/L (ref 3.5–5.1)
Sodium: 142 mmol/L (ref 135–145)

## 2018-10-07 LAB — TROPONIN I: Troponin I: <0.03 ng/mL (ref <0.03)

## 2018-10-07 LAB — SARS CORONAVIRUS 2 BY RT PCR (HOSPITAL ORDER, PERFORMED IN ~~LOC~~ HOSPITAL LAB): SARS Coronavirus 2: NEGATIVE

## 2018-10-07 MED ORDER — SODIUM CHLORIDE 0.9 % IV BOLUS
1000.0000 mL | Freq: Once | INTRAVENOUS | Status: AC
  Administered 2018-10-07: 1000 mL via INTRAVENOUS

## 2018-10-07 MED ORDER — HYPROMELLOSE (GONIOSCOPIC) 2.5 % OP SOLN
1.0000 [drp] | Freq: Three times a day (TID) | OPHTHALMIC | Status: DC | PRN
  Filled 2018-10-07: qty 15

## 2018-10-07 MED ORDER — POLYETHYLENE GLYCOL 3350 17 G PO PACK: 17.0000 g | PACK | Freq: Every day | ORAL | Status: DC | PRN

## 2018-10-07 MED ORDER — IPRATROPIUM BROMIDE HFA 17 MCG/ACT IN AERS
2.0000 | INHALATION_SPRAY | RESPIRATORY_TRACT | Status: DC | PRN
  Filled 2018-10-07: qty 12.9

## 2018-10-07 MED ORDER — ORAL CARE MOUTH RINSE
15.0000 mL | Freq: Two times a day (BID) | OROMUCOSAL | Status: DC
  Administered 2018-10-07 – 2018-10-09 (×4): 15 mL via OROMUCOSAL

## 2018-10-07 MED ORDER — PIPERACILLIN-TAZOBACTAM 3.375 G IVPB
3.3750 g | Freq: Three times a day (TID) | INTRAVENOUS | Status: DC
  Administered 2018-10-07 (×2): 3.375 g via INTRAVENOUS
  Filled 2018-10-07 (×5): qty 50

## 2018-10-07 MED ORDER — MOMETASONE FURO-FORMOTEROL FUM 200-5 MCG/ACT IN AERO
2.0000 | INHALATION_SPRAY | Freq: Two times a day (BID) | RESPIRATORY_TRACT | Status: DC
  Administered 2018-10-07 – 2018-10-10 (×6): 2 via RESPIRATORY_TRACT
  Filled 2018-10-07: qty 8.8

## 2018-10-07 MED ORDER — POLYVINYL ALCOHOL 1.4 % OP SOLN
1.0000 [drp] | Freq: Three times a day (TID) | OPHTHALMIC | Status: DC | PRN
  Filled 2018-10-07: qty 15

## 2018-10-07 MED ORDER — ROPINIROLE HCL 1 MG PO TABS
0.5000 mg | ORAL_TABLET | Freq: Every day | ORAL | Status: DC
  Administered 2018-10-07 – 2018-10-09 (×3): 0.5 mg via ORAL
  Filled 2018-10-07 (×4): qty 1

## 2018-10-07 MED ORDER — LEVOFLOXACIN IN D5W 750 MG/150ML IV SOLN: 750.0000 mg | INTRAVENOUS | Status: DC

## 2018-10-07 MED ORDER — IPRATROPIUM BROMIDE HFA 17 MCG/ACT IN AERS
2.0000 | INHALATION_SPRAY | RESPIRATORY_TRACT | Status: DC
  Administered 2018-10-09: 2 via RESPIRATORY_TRACT
  Filled 2018-10-07: qty 12.9

## 2018-10-07 MED ORDER — PIPERACILLIN-TAZOBACTAM 3.375 G IVPB
3.3750 g | Freq: Three times a day (TID) | INTRAVENOUS | Status: DC
  Administered 2018-10-07 – 2018-10-09 (×5): 3.375 g via INTRAVENOUS
  Filled 2018-10-07 (×6): qty 50

## 2018-10-07 MED ORDER — ENOXAPARIN SODIUM 40 MG/0.4ML ~~LOC~~ SOLN
40.0000 mg | SUBCUTANEOUS | Status: DC
  Administered 2018-10-07 – 2018-10-09 (×3): 40 mg via SUBCUTANEOUS
  Filled 2018-10-07 (×3): qty 0.4

## 2018-10-07 MED ORDER — ALBUTEROL SULFATE HFA 108 (90 BASE) MCG/ACT IN AERS
4.0000 | INHALATION_SPRAY | RESPIRATORY_TRACT | Status: DC | PRN
  Filled 2018-10-07: qty 6.7

## 2018-10-07 MED ORDER — ACETAMINOPHEN 325 MG PO TABS
650.0000 mg | ORAL_TABLET | Freq: Four times a day (QID) | ORAL | Status: DC | PRN
  Administered 2018-10-07 (×2): 650 mg via ORAL
  Filled 2018-10-07 (×2): qty 2

## 2018-10-07 NOTE — ED Notes (Signed)
Pt ambulated steady without assistance, stead gait. Oxygen was at 92 while walking, dropped to 82 when returned to bed and was placed back on oxygen and returned back to 95
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
## 2018-10-07 NOTE — Progress Notes (Signed)
The patient is admitted to 2 W with the diagnosis of hypoxia. A & O x 4. Denied any any acute pain at this time. Patient oriented to staff, ascom/call bell. Full assessment to epic completed. Will continue to monitor.

## 2018-10-07 NOTE — Progress Notes (Signed)
Explained to pt since he was on Covid r/o Ibuprofen or NSAIDs were not advised and when he was able to have Tylenol again at 2100 I'd bring it in. Pt stated he had a mild HA and he found at home Tylenol wasn't effective that is why he always used Ibuprofen. Again apologized for not being allowed to give it to him. Pt also upset because a doctor didn't come see him today especially that they didn't tell him he was going to be here a couple more days at least.

## 2018-10-07 NOTE — Progress Notes (Signed)
Pharmacy Antibiotic Note  Favio Moder is a 73 y.o. male admitted on 10/06/2018 with pneumonia.  Pharmacy has been consulted for zosyn dosing. SCr 0.78, CrCl ~ 86 ml/min  Plan: Zosyn 3.375g IV every 8 hours Monitor renal function, LOT and ability to narrow  Height: 5\' 10"  (177.8 cm) Weight: 167 lb 12.8 oz (76.1 kg) IBW/kg (Calculated) : 73  Temp (24hrs), Avg:99.4 F (37.4 C), Min:98.2 F (36.8 C), Max:100.6 F (38.1 C)  Recent Labs  Lab 10/06/18 2221 10/07/18 0425  WBC 14.1* 13.7*  CREATININE 1.05 0.78  LATICACIDVEN 1.6  --     Estimated Creatinine Clearance: 86.2 mL/min (by C-G formula based on SCr of 0.78 mg/dL).    Allergies  Allergen Reactions  . Prolixin [Fluphenazine]     Severe back pain  . Advair Diskus [Fluticasone-Salmeterol] Anxiety    Antimicrobials this admission: Zosyn 5/3>> Ceftriaxone x1 azithro x1  Dose adjustments this admission: n/a  Microbiology results: 5/2 BCx: sent 5/2 COVID: neg  Bertis Ruddy, PharmD Clinical Pharmacist Please check AMION for all [place] numbers 10/07/2018 7:13 AM
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
## 2018-10-07 NOTE — Progress Notes (Signed)
PT Cancellation Note  Patient Details Name: Jeffrey Hines MRN: [id number] DOB: 1945-10-18   Cancelled Treatment:    Reason Eval/Treat Not Completed: (P) Medical issues which prohibited therapy Per RN although pt initial Covid results were negative, pt physician has requested retest. Charge RN requests follow back for Evaluation once subsequent test results are back to assure proper PPE. PT will follow back tomorrow.   Marijane Trower B. Migdalia Dk PT, DPT Acute Rehabilitation Services Pager [phone] Office [phone]    [place] 10/07/2018, 12:25 PM

## 2018-10-08 ENCOUNTER — Inpatient Hospital Stay: Payer: Medicare Other | Admitting: Internal Medicine

## 2018-10-08 ENCOUNTER — Other Ambulatory Visit (HOSPITAL_COMMUNITY): Payer: Self-pay

## 2018-10-08 ENCOUNTER — Telehealth: Payer: Self-pay | Admitting: Internal Medicine

## 2018-10-08 ENCOUNTER — Inpatient Hospital Stay (HOSPITAL_COMMUNITY): Payer: Medicare Other

## 2018-10-08 ENCOUNTER — Telehealth: Payer: Self-pay | Admitting: Pulmonary Disease

## 2018-10-08 ENCOUNTER — Encounter (HOSPITAL_COMMUNITY): Payer: Self-pay

## 2018-10-08 DIAGNOSIS — R0902 Hypoxemia: Secondary | ICD-10-CM

## 2018-10-08 LAB — CBC
HCT: 38.4 % — ABNORMAL LOW (ref 39.0–52.0)
Hemoglobin: 12.6 g/dL — ABNORMAL LOW (ref 13.0–17.0)
MCH: 28.6 pg (ref 26.0–34.0)
MCHC: 32.8 g/dL (ref 30.0–36.0)
MCV: 87.1 fL (ref 80.0–100.0)
Platelets: 194 10*3/uL (ref 150–400)
RBC: 4.41 MIL/uL (ref 4.22–5.81)
RDW: 12.6 % (ref 11.5–15.5)
WBC: 12 10*3/uL — ABNORMAL HIGH (ref 4.0–10.5)
nRBC: 0 % (ref 0.0–0.2)

## 2018-10-08 LAB — BASIC METABOLIC PANEL
Anion gap: 9 (ref 5–15)
BUN: 12 mg/dL (ref 8–23)
CO2: 25 mmol/L (ref 22–32)
Calcium: 8.4 mg/dL — ABNORMAL LOW (ref 8.9–10.3)
Chloride: 105 mmol/L (ref 98–111)
Creatinine, Ser: 0.95 mg/dL (ref 0.61–1.24)
GFR calc Af Amer: >60 mL/min (ref >60)
GFR calc non Af Amer: >60 mL/min (ref >60)
Glucose, Bld: 105 mg/dL — ABNORMAL HIGH (ref 70–99)
Potassium: 3.7 mmol/L (ref 3.5–5.1)
Sodium: 139 mmol/L (ref 135–145)

## 2018-10-08 LAB — NOVEL CORONAVIRUS, NAA (HOSP ORDER, SEND-OUT TO REF LAB; TAT 18-24 HRS): SARS-CoV-2, NAA: NOT DETECTED

## 2018-10-08 LAB — MRSA PCR SCREENING: MRSA by PCR: NEGATIVE

## 2018-10-08 MED ORDER — CLONAZEPAM 0.5 MG PO TABS: 0.2500 mg | ORAL_TABLET | Freq: Three times a day (TID) | ORAL | Status: DC | PRN

## 2018-10-08 MED ORDER — IOHEXOL 300 MG/ML  SOLN
75.0000 mL | Freq: Once | INTRAMUSCULAR | Status: AC | PRN
  Administered 2018-10-08: 75 mL via INTRAVENOUS

## 2018-10-08 MED ORDER — CLONAZEPAM 0.25 MG PO TBDP
0.2500 mg | ORAL_TABLET | Freq: Three times a day (TID) | ORAL | Status: DC | PRN
  Administered 2018-10-09 – 2018-10-10 (×2): 0.25 mg via ORAL
  Filled 2018-10-08 (×2): qty 1

## 2018-10-08 NOTE — Progress Notes (Signed)
Msg sent to doctor at [phone] about renewing Tele with continuous pulse ox or just having bedside continuous pulse ox.

## 2018-10-08 NOTE — Telephone Encounter (Signed)
Returned patient's phone call regarding cancelling an appointment, per patient's request 05/05 appointment has been cancelled due to patient being in the hospital.

## 2018-10-08 NOTE — Progress Notes (Signed)
Family Med Doctor notified of irritation and agitation and pt/wife informed me earlier this is a sign his temp is increasing. Temp now 100.7 was 98.4 at change of shifts VS. Pt had Tylenol approx 2 hours ago. Order to give pt Albuterol MDI  (prn) and Tears eye drops for c/o tearing and burning of eyes. Keep oxygen sats greater than 90% and High Flow Oxygen would benefit the pt.

## 2018-10-08 NOTE — Telephone Encounter (Signed)
Called and spoke with patient regarding TN recommendations and response below Advised pt that he is being followed by the hospital doctors in the hospital Message will be sent over to BQ for review and update of pt's status Pt verbalized and expressed understanding Nothing further needed at this time.

## 2018-10-08 NOTE — Telephone Encounter (Signed)
I called patient, but we got disconnected. Please let him know that he is being followed by the hospital doctors in the hospital. I will forward note to Dr. [place] Bells as well so that he will know that patient is in the hospital. Thanks.

## 2018-10-08 NOTE — Progress Notes (Signed)
Return from doctor: DC tele and start continuous bedside pulse ox. Keep sats 88-93% (sat parameters written earlier today)

## 2018-10-08 NOTE — Progress Notes (Signed)
Pt continues to nap on and off. Easily awakens. Sleeping in upright position ([employer] [place]) on back

## 2018-10-08 NOTE — Progress Notes (Signed)
Telemetry notified that doctor is going to reorder telemetry with cont pulse ox.

## 2018-10-08 NOTE — Progress Notes (Signed)
Oxygen sats on 4 L/M [place] 88-89% at rest. Resp easy and non labored at 20. Oxygen increased to 5 L/M [place] to get to goal oxygen level of 90% MD just gave me.

## 2018-10-08 NOTE — Evaluation (Signed)
Physical Therapy Evaluation Patient Details Name: Jeffrey Hines MRN: [id number] DOB: 09/24/1945 Today's Date: 10/08/2018   History of Present Illness  Jeffrey Hines is a 73 y.o. male presenting with acute on chronic hypoxic respiratory failure . PMH is significant for COPD, Stage II lung cancer, postobstructive PNA d/t pseudomonas. Initial COVID negative, but repeat send out ordered.  Clinical Impression  Pt admitted with above. Pt mobility limited by pain in lungs with breathing and SOB with moving. Pt functioning at min guard level. Pt SPO2 dec to 82% on 3Lo2 via [place] during marching in place. Recovered to91% within 2 min. Pt states "I was scared to go to sleep last night, I'm tired of trying." Notified RN, palliative consult may be appropriate. Acute PT to cont to follow.    Follow Up Recommendations No PT follow up;Supervision/Assistance - 24 hour    Equipment Recommendations  None recommended by PT    Recommendations for Other Services       Precautions / Restrictions Precautions Precautions: Fall Precaution Comments: droplet, contact precautions, pain with deep breaths Restrictions Weight Bearing Restrictions: No      Mobility  Bed Mobility Overal bed mobility: Needs Assistance Bed Mobility: Supine to Sit     Supine to sit: Supervision     General bed mobility comments: HOB elevated, no physical assist needed  Transfers Overall transfer level: Needs assistance Equipment used: None Transfers: Sit to/from [facility] Sit to Stand: Min assist Stand pivot transfers: Min assist       General transfer comment: minA for power up but steady, min guard for std pvt to chair  Ambulation/Gait         Gait velocity: slow   General Gait Details: due to precautions limited to in room. pt marched in place x 1 min and SPO2 dec to 82% on 3LO2 via [place] with report "I can't go anymore, It hurts."  Stairs            Wheelchair Mobility    Modified  [place] (Stroke Patients Only)       Balance Overall balance assessment: Needs assistance Sitting-balance support: No upper extremity supported;Feet supported Sitting balance-Leahy Scale: Fair     Standing balance support: No upper extremity supported Standing balance-Leahy Scale: Fair Standing balance comment: preferes to have unilateral UE support                             Pertinent Vitals/Pain Pain Assessment: 0-10 Pain Score: 8  Pain Location: lungs with deep breaths Pain Descriptors / Indicators: Sharp Pain Intervention(s): Monitored during session    Home Living Family/patient expects to be discharged to:: Private residence Living Arrangements: Spouse/significant other Available Help at Discharge: Family;Available 24 hours/day Type of Home: House Home Access: Stairs to enter Entrance Stairs-Rails: Right Entrance Stairs-Number of Steps: 5 Home Layout: One level Home Equipment: None      Prior Function Level of Independence: Independent         Comments: was driving, likes to work on Hydrologist Dominance   Dominant Hand: Right    Extremity/Trunk Assessment   Upper Extremity Assessment Upper Extremity Assessment: Generalized weakness    Lower Extremity Assessment Lower Extremity Assessment: Generalized weakness    Cervical / Trunk Assessment Cervical / Trunk Assessment: Normal  Communication   Communication: HOH  Cognition Arousal/Alertness: Awake/alert Behavior During Therapy: WFL for tasks assessed/performed Overall Cognitive Status: Within Functional Limits for tasks assessed  General Comments General comments (skin integrity, edema, etc.): SpO2 dec to 82% on 3LO2 during marching in place, returned to 91% within 2 min with v/c's to purse lip breath    Exercises     Assessment/Plan    PT Assessment Patient needs continued PT services  PT Problem List Decreased  activity tolerance;Decreased balance;Decreased mobility;Decreased strength;Pain       PT Treatment Interventions Gait training;Stair training;Functional mobility training;Therapeutic activities;Therapeutic exercise;Balance training;Neuromuscular re-education    PT Goals (Current goals can be found in the Care Plan section)  Acute Rehab PT Goals Patient Stated Goal: improve the pain and breathing PT Goal Formulation: With patient Time For Goal Achievement: 10/22/18 Potential to Achieve Goals: Good    Frequency Min 3X/week   Barriers to discharge        Co-evaluation               AM-PAC PT "6 Clicks" Mobility  Outcome Measure Help needed turning from your back to your side while in a flat bed without using bedrails?: None Help needed moving from lying on your back to sitting on the side of a flat bed without using bedrails?: None Help needed moving to and from a bed to a chair (including a wheelchair)?: A Little Help needed standing up from a chair using your arms (e.g., wheelchair or bedside chair)?: A Little Help needed to walk in hospital room?: A Little Help needed climbing 3-5 steps with a railing? : A Little 6 Click Score: 20    End of Session Equipment Utilized During Treatment: Gait belt Activity Tolerance: Patient limited by pain Patient left: in chair;with call bell/phone within reach Nurse Communication: Mobility status PT Visit Diagnosis: Unsteadiness on feet (R26.81);Pain;Muscle weakness (generalized) (M62.81);Difficulty in walking, not elsewhere classified (R26.2) Pain - part of body: (lungs)    Time: 3810-1751 PT Time Calculation (min) (ACUTE ONLY): 29 min   Charges:   PT Evaluation $PT Eval Moderate Complexity: 1 Mod PT Treatments $Therapeutic Activity: 8-22 mins        Kittie Plater, PT, DPT Acute Rehabilitation Services Pager #: [phone] Office #: [phone]   [place] 10/08/2018, 11:30 AM

## 2018-10-08 NOTE — Progress Notes (Signed)
Family Med Teaching Service Doctor given update in condition including temp at max temp tonight of 100.9. No c/o from pt and pt just napping on and off. Also informed pt remains on 5 L/M  oxygen and sats 91-92%. Informed doctor cont pulse ox connected to tele and tele orders dc around 0500. Doctor to renew order.

## 2018-10-08 NOTE — Telephone Encounter (Signed)
Pt's spouse calling to inform that spouse was admitted on yesterday morning with pneumonia. He is a BQ patient. They are really afraid as he is very sick currently on 3 liters. They would like BQ to be aware and possibly consult. Made aware BQ working COVID-19 shifts. Pt's spouse wife would like a call back from app.

## 2018-10-08 NOTE — Progress Notes (Addendum)
Family Medicine Teaching Service Daily Progress Note Intern Pager: [phone]  Patient name: Jeffrey Hines Medical record number: [id number] Date of birth: 1945-07-11 Age: 73 y.o. Gender: male  Primary Care Provider: [facility] Consultants: none Code Status: full  Pt Overview and Major Events to Date:  5/3 - admitted.  Initial covid negative, repeat send out ordered.   Assessment and Plan: Jeffrey Hines is a 73 y.o. male presenting with acute on chronic hypoxic respiratory failure . PMH is significant for COPD, Stage II lung cancer, postobstructive PNA d/t pseudomonas  Acute on chronic hypoxia and dyspnea on exertion - hx of COPD and lung Ca s/p L upper and lower lobectomy. Pt has a chronic pseudomonal pneumonia. 2L O2 at night at home, requiring 5L currently. Febrile to 100.9 overnight. Wbc 13.7. Rapid covid test negative, getting repeat. Currently on IV zosyn. NG2D blood cultures.  - continue zosyn (5/3- )/CTX on 5/2.   - Continuous pulse ox,  - continuous cardiac monitor.  - Wean O2 as able, goal 88-92%.   - dulera - PRN ipratropium and albuterol inhalers.  - PT eval  - f/u covid testing - tylenol prn for fever/pain  Stage II Lung carcinoma - s/p chemotherapy and wedge resection of L upper and lower in 2016.  Follows w/ Dr. Inda Merlin outpatient. Has CT lung scheduled for 5/5.  - f/u CT w/ contrast  Hx of postobstructive pseudomonal PNA - chronically on 7 days of doxy and augmentin in alternating month.  Completed two days of augmentin this month.  - mgmt as above  Anxiety - pt has klonopin TID at home for anxiety.  Patient reports being less anxious now that he is out of the ED - start klonopin 0.25mg  TID PRN  FEN/GI: miralax prn PPx: lovenox  Disposition: home  Subjective:  Feels better today.  He says his lungs feel more 'clear'.  He takes his klonopin TID for anxiety.  Has only been feeling anxious while he was in the ED.    Objective: Temp:  [98 F (36.7  C)-100.9 F (38.3 C)] 100.9 F (38.3 C) (05/04 0328) Pulse Rate:  [66-99] 90 (05/04 0328) Resp:  [16-20] 20 (05/04 0328) BP: (121-143)/(74-80) 135/79 (05/04 0328) SpO2:  [88 %-99 %] 92 % (05/04 0328) Physical Exam: See attending attestation for physical exam   Laboratory: Recent Labs  Lab 10/06/18 2221 10/07/18 0425  WBC 14.1* 13.7*  HGB 14.7 12.2*  HCT 45.7 37.7*  PLT 210 179   Recent Labs  Lab 10/06/18 2221 10/07/18 0425  NA 134* 142  K 3.7 4.0  CL 98 106  CO2 25 21*  BUN 15 12  CREATININE 1.05 0.78  CALCIUM 8.8* 8.3*  PROT 8.1  --   BILITOT 1.2  --   ALKPHOS 83  --   ALT 18  --   AST 24  --   GLUCOSE 116* 91    Imaging/Diagnostic Tests:   Benay Pike, MD 10/08/2018, 6:01 AM PGY-1, [place] Intern pager: [phone], text pages welcome

## 2018-10-08 NOTE — Progress Notes (Signed)
Tele station informed they need to call staff if oxygen sats drop below 91%. Stated note will be applied to pt's tele section.

## 2018-10-08 NOTE — Progress Notes (Signed)
Informed pt that doctor ordered Albuterol Inhaler for him and Tears Eye drops. Asked pt if he wanted them and he stated No. The inhaler causes BLE cramps and then he has to get out of bed and walk and he doesn't feel like he needs the tears. Asked pt if he wanted a cool cloth to forehead for temp since Tylenol didn't work for pain and he still got a temp plus he couldn't have it yet anyway. Pt stated he'd try a cool cloth. HOB elevated. Informed pt I would be back in and check on him and his temp in 1 hour. Pt ok with plan. Instructed pt to call immediately if anything changes before I come in or at any time. Pt voiced understanding.

## 2018-10-08 NOTE — Progress Notes (Signed)
Encouraged pt to try to sleep on his stomach. Pt stated he's not been able to sleep on his stomach or left side since his left lung cancer and surgery. Assisted pt to right side. Temp down to 100.2. Pt stated every once in awhile he gets chest discomfort in the center of his chest but it doesn't last long and it isn't frequent. I asked if it was when he took deep breaths and he stated No because he can't take deep breaths. I encouraged pt to take deep breaths although it may hurt to get more air into the bottom of his lung. Pt does take deep breaths to cough because he has a strong productive cough. Will keep a close eye on pt. Instructed pt to call me if anything at all changes (chest pain/discomfort gets worse, breathing becomes worse: harder to breath or catch breath, increased SOB, he becomes more irritated or restless, or pain anywhere increases, or anything that is different). Pt voiced understanding. Will check temp in 1 hour. Pt also stated he's afraid to go to sleep because he feels he'll stop breathing. Reassured we have different staff members including me watching him very closely and to try to get a little sleep or his condition could get worse.

## 2018-10-09 ENCOUNTER — Other Ambulatory Visit: Payer: Medicare Other

## 2018-10-09 ENCOUNTER — Ambulatory Visit (HOSPITAL_COMMUNITY): Payer: Medicare Other

## 2018-10-09 DIAGNOSIS — J9601 Acute respiratory failure with hypoxia: Secondary | ICD-10-CM

## 2018-10-09 LAB — CBC WITH DIFFERENTIAL/PLATELET
Abs Immature Granulocytes: 0.02 10*3/uL (ref 0.00–0.07)
Basophils Absolute: 0 10*3/uL (ref 0.0–0.1)
Basophils Relative: 0 %
Eosinophils Absolute: 0.1 10*3/uL (ref 0.0–0.5)
Eosinophils Relative: 1 %
HCT: 36.5 % — ABNORMAL LOW (ref 39.0–52.0)
Hemoglobin: 11.9 g/dL — ABNORMAL LOW (ref 13.0–17.0)
Immature Granulocytes: 0 %
Lymphocytes Relative: 10 %
Lymphs Abs: 0.9 10*3/uL (ref 0.7–4.0)
MCH: 28.3 pg (ref 26.0–34.0)
MCHC: 32.6 g/dL (ref 30.0–36.0)
MCV: 86.9 fL (ref 80.0–100.0)
Monocytes Absolute: 1.3 10*3/uL — ABNORMAL HIGH (ref 0.1–1.0)
Monocytes Relative: 15 %
Neutro Abs: 6.2 10*3/uL (ref 1.7–7.7)
Neutrophils Relative %: 74 %
Platelets: 202 10*3/uL (ref 150–400)
RBC: 4.2 MIL/uL — ABNORMAL LOW (ref 4.22–5.81)
RDW: 12.6 % (ref 11.5–15.5)
WBC: 8.5 10*3/uL (ref 4.0–10.5)
nRBC: 0 % (ref 0.0–0.2)

## 2018-10-09 LAB — BASIC METABOLIC PANEL
Anion gap: 11 (ref 5–15)
BUN: 12 mg/dL (ref 8–23)
CO2: 26 mmol/L (ref 22–32)
Calcium: 8.2 mg/dL — ABNORMAL LOW (ref 8.9–10.3)
Chloride: 101 mmol/L (ref 98–111)
Creatinine, Ser: 0.85 mg/dL (ref 0.61–1.24)
GFR calc Af Amer: >60 mL/min (ref >60)
GFR calc non Af Amer: >60 mL/min (ref >60)
Glucose, Bld: 104 mg/dL — ABNORMAL HIGH (ref 70–99)
Potassium: 3.2 mmol/L — ABNORMAL LOW (ref 3.5–5.1)
Sodium: 138 mmol/L (ref 135–145)

## 2018-10-09 MED ORDER — IBUPROFEN 200 MG PO TABS
400.0000 mg | ORAL_TABLET | Freq: Once | ORAL | Status: AC
  Administered 2018-10-09: 400 mg via ORAL
  Filled 2018-10-09: qty 2

## 2018-10-09 MED ORDER — LEVOFLOXACIN 750 MG PO TABS
750.0000 mg | ORAL_TABLET | Freq: Every day | ORAL | Status: DC
  Administered 2018-10-09: 750 mg via ORAL
  Filled 2018-10-09: qty 1

## 2018-10-09 MED ORDER — POTASSIUM CHLORIDE CRYS ER 20 MEQ PO TBCR
40.0000 meq | EXTENDED_RELEASE_TABLET | Freq: Once | ORAL | Status: AC
  Administered 2018-10-09: 40 meq via ORAL
  Filled 2018-10-09: qty 2

## 2018-10-09 MED ORDER — IPRATROPIUM-ALBUTEROL 0.5-2.5 (3) MG/3ML IN SOLN: 3.0000 mL | RESPIRATORY_TRACT | Status: DC | PRN

## 2018-10-09 NOTE — Progress Notes (Signed)
Physical Therapy Treatment Patient Details Name: Jeffrey Hines MRN: [id number] DOB: 08/12/1945 Today's Date: 10/09/2018    History of Present Illness Jeffrey Hines is a 73 y.o. male presenting with acute on chronic hypoxic respiratory failure secondary to PNA. COVID-19 test (-). PMH includes COPD, Stage II lung cancer, post-obstructive PNA due to pseudomonas.   PT Comments    Pt progressing well with mobility. Indep with mobility, although remains limited by DOE with minimal activity. SpO2 down to 88% on RA with ambulation, down to 85% after stair training; returning to 92% on RA with prolonged standing rest break and deep breathing (RN aware). Further education regarding energy conservation strategies and potential for return home with O2. Pt has met short-term acute PT goals. Will d/c PT.    Follow Up Recommendations  No PT follow up;Supervision - Intermittent     Equipment Recommendations  None recommended by PT    Recommendations for Other Services       Precautions / Restrictions Precautions Precautions: Fall Restrictions Weight Bearing Restrictions: No    Mobility  Bed Mobility Overal bed mobility: Independent                Transfers Overall transfer level: Independent Equipment used: None                Ambulation/Gait Ambulation/Gait assistance: Independent Gait Distance (Feet): 400 Feet Assistive device: None Gait Pattern/deviations: Step-through pattern;Decreased stride length Gait velocity: Decreased Gait velocity interpretation: 1.31 - 2.62 ft/sec, indicative of limited community ambulator General Gait Details: Steady gait, indep without device; required multiple standing rest breaks due to DOE. SpO2 down to 88% on RA while ambulating; down to 85% after stair training requiring prolonged standing rest and deep breathing to recover. Cues to stop talking and focus on breathing   Stairs Stairs: Yes Stairs assistance: Modified independent  (Device/Increase time) Stair Management: One rail Right;Alternating pattern;Forwards Number of Stairs: 10 General stair comments: Mod indep with rail support. SpO2 down to 85% on RA and DOE 3/4   Wheelchair Mobility    Modified Rankin (Stroke Patients Only)       Balance Overall balance assessment: Needs assistance Sitting-balance support: No upper extremity supported;Feet supported Sitting balance-Leahy Scale: Good       Standing balance-Leahy Scale: Good                              Cognition Arousal/Alertness: Awake/alert Behavior During Therapy: WFL for tasks assessed/performed Overall Cognitive Status: Within Functional Limits for tasks assessed                                        Exercises      General Comments        Pertinent Vitals/Pain Pain Assessment: No/denies pain    Home Living                      Prior Function            PT Goals (current goals can now be found in the care plan section) Progress towards PT goals: Goals met/education completed, patient discharged from PT    Frequency    Min 3X/week      PT Plan Current plan remains appropriate    Co-evaluation              AM-PAC  PT "6 Clicks" Mobility   Outcome Measure  Help needed turning from your back to your side while in a flat bed without using bedrails?: None Help needed moving from lying on your back to sitting on the side of a flat bed without using bedrails?: None Help needed moving to and from a bed to a chair (including a wheelchair)?: None Help needed standing up from a chair using your arms (e.g., wheelchair or bedside chair)?: None Help needed to walk in hospital room?: None Help needed climbing 3-5 steps with a railing? : None 6 Click Score: 24    End of Session   Activity Tolerance: Patient tolerated treatment well Patient left: in bed;with call bell/phone within reach Nurse Communication: Mobility status PT  Visit Diagnosis: Muscle weakness (generalized) (M62.81)     Time: 0634-9494 PT Time Calculation (min) (ACUTE ONLY): 14 min  Charges:  $Gait Training: 8-22 mins                    Mabeline Caras, PT, DPT Acute Rehabilitation Services  Pager [phone] Office [place] 10/09/2018, 4:51 PM

## 2018-10-09 NOTE — Progress Notes (Addendum)
Spoke with Dr. Ashley Mariner from pulm who said he it was okay to start patient on 7 days of 750mg  PO Levaquin for a total of 10 days of antibiotic treatment.

## 2018-10-09 NOTE — Discharge Summary (Addendum)
[facility] Discharge Summary  Patient name: Jeffrey Hines Medical record number: [id number] Date of birth: 1946/06/02 Age: 73 y.o. Gender: male Date of Admission: 10/06/2018  Date of Discharge: 10/10/2018 Admitting Physician: Alveda Reasons, MD  Primary Care Provider: [facility] Consultants: none  Indication for Hospitalization: pneumonia  Discharge Diagnoses/Problem List:  Pneumonia Anxiety Stage II lung carcinoma Anxiety Restless leg syndrome  Disposition: Home  Discharge Condition: Stable  Discharge Exam:  General: NAD, non-toxic, well-appearing, sitting comfortably in chair.  Cardiovascular: RRR, normal S1, S2. 2+ RP bilaterally.  No BLEE Respiratory: Good air movement bilaterally.  Some mild high-pitched rhonchi with expiration. No IWOB.  Omair Abdomen: + BS. NT, ND, soft to palpation.  Extremities: Warm and well perfused. Moving spontaneously.   Brief Hospital Course:  Patient came to ED after having worsening dyspnea and cough.  He was found to be hypoxic to 83%. He was started on CTX and azithromycin in the ED which was then changed to zosyn on admission. He was tested for covid and was negative x2.  He was treated with dulera and PRN ipratropium and albuterol.  He initially required 5L oxygen and was eventually weaned down to RA by day of discharge.  CT scan on 5/4 showed bronchopneumonia of the right lower lobe, increasing consolidation of the left lobe, and postobstructive infection/inflammation.  Pulmonology was consulted and agreed he could be transitioned to PO levaquin.  Patient was discharged on a 10-day course of p.o. Levaquin.  Issues for Follow Up:  1. Patient's respiratory status  Significant Procedures: none  Significant Labs and Imaging:  Recent Labs  Lab 10/08/18 0620 10/09/18 0450 10/10/18 0448  WBC 12.0* 8.5 6.4  HGB 12.6* 11.9* 12.1*  HCT 38.4* 36.5* 36.8*  PLT 194 202 190   Recent Labs  Lab  10/06/18 2221 10/07/18 0425 10/08/18 0620 10/09/18 0450 10/10/18 0448  NA 134* 142 139 138 141  K 3.7 4.0 3.7 3.2* 3.9  CL 98 106 105 101 100  CO2 25 21* 25 26 30   GLUCOSE 116* 91 105* 104* 114*  BUN 15 12 12 12 11   CREATININE 1.05 0.78 0.95 0.85 0.80  CALCIUM 8.8* 8.3* 8.4* 8.2* 8.4*  ALKPHOS 83  --   --   --   --   AST 24  --   --   --   --   ALT 18  --   --   --   --   ALBUMIN 4.0  --   --   --   --     Results/Tests Pending at Time of Discharge: none  Discharge Medications:  Allergies as of 10/10/2018      Reactions   Prolixin [fluphenazine]    Severe back pain   Advair Diskus [fluticasone-salmeterol] Anxiety      Medication List    STOP taking these medications   predniSONE 10 MG tablet Commonly known as:  DELTASONE     TAKE these medications   amoxicillin-clavulanate 875-125 MG tablet Commonly known as:  AUGMENTIN TAKE 1 TABLET BY MOUTH TWICE A DAY FIRST 5 DAYS OF EVERY OTHER MONTH (MAR, MAY, JULY) What changed:    how much to take  how to take this  when to take this   BIOFLEX PO Take 1 tablet by mouth 2 (two) times daily.   clonazePAM 0.5 MG tablet Commonly known as:  KLONOPIN Take 0.5 mg by mouth 3 (three) times daily.   doxycycline 100 MG tablet Commonly  known as:  VIBRA-TABS TAKE 1 TABLET BY MOUTH TWICE DAILY FIRST 7 DAYS OF EVERY OTHER MONTH (FEB, APR, JUNE) What changed:  See the new instructions.   Flutter Devi 1 Device by Does not apply route as directed.   ibuprofen 200 MG tablet Commonly known as:  ADVIL Take 400 mg by mouth 2 (two) times daily. Reported on 08/03/2015   levofloxacin 750 MG tablet Commonly known as:  LEVAQUIN Take 1 tablet (750 mg total) by mouth daily for 5 days. What changed:  Another medication with the same name was removed. Continue taking this medication, and follow the directions you see here.   loratadine 10 MG tablet Commonly known as:  CLARITIN Take 10 mg by mouth at bedtime.   rOPINIRole 0.5 MG  tablet Commonly known as:  REQUIP TAKE 1 TABLET (0.5 MG TOTAL) BY MOUTH AT BEDTIME FOR 30 DAYS.   Symbicort 160-4.5 MCG/ACT inhaler Generic drug:  budesonide-formoterol Inhale 2 puffs into the lungs 2 (two) times daily.       Discharge Instructions: Please refer to Patient Instructions section of EMR for full details.  Patient was counseled important signs and symptoms that should prompt return to medical care, changes in medications, dietary instructions, activity restrictions, and follow up appointments.   Follow-Up Appointments: Follow-up Information    Curt Bears, MD Follow up.   Specialty:  Oncology Contact information: [place] [address] [phone]        [facility] Follow up.   Why:  nurse from [place] will call with further instructions Contact information: [place] [address] [phone]           Wilber Oliphant, MD 10/10/2018, 9:14 PM PGY-1, [place]

## 2018-10-09 NOTE — Progress Notes (Signed)
Family Medicine Teaching Service Daily Progress Note Intern Pager: [phone]  Patient name: Jeffrey Hines Medical record number: [id number] Date of birth: Nov 16, 1945 Age: 73 y.o. Gender: male  Primary Care Provider: [facility] Consultants: none Code Status: full  Pt Overview and Major Events to Date:  5/3 - admitted.  Initial covid negative, repeat send out ordered.   Assessment and Plan: Maui Britten is a 73 y.o. male presenting with acute on chronic hypoxic respiratory failure . PMH is significant for COPD, Stage II lung cancer, postobstructive PNA d/t pseudomonas  Acute on chronic hypoxia 2/2 Pneumonia - hx of COPD and lung Ca s/p L upper and lower lobectomy. Pt has a chronic pseudomonal pneumonia for which he takes courses of doxycycline or augmentin on alternating months. 2L O2 at night at home, requiring 5L currently. afebrileovernight. Wbc 8.5.  COVID negative x2. Currently on IV zosyn. NG2D blood cultures. MRSA pcr negative. NG2D on blood cultures  - discontinue zosyn (5/3-5/5 )/CTX on 5/2.   - start PO levaquin 750mg  (5/5-5/11) - Continuous pulse ox,  - Wean O2 as able, goal 88-92%.   - dulera - PRN ipratropium and albuterol inhalers.  - PT eval  - tylenol prn for fever/pain  Stage II Lung carcinoma - s/p chemotherapy and wedge resection of L upper and lower in 2016.  Follows w/ Dr. Inda Merlin outpatient. CT Lung was performed and showed likely postobstructive infection/inflammation on the left side.   Hx of postobstructive pseudomonal PNA - chronically on 7 days of doxy and augmentin in alternating month.  Completed two days of augmentin this month.  - mgmt as above  Anxiety - pt has klonopin TID at home for anxiety.  Patient reports being less anxious now that he is out of the ED - start klonopin 0.25mg  TID PRN  Restless leg syndrome - takes ropinirole 0.5mg  qhs - continue ropinirole  FEN/GI: miralax prn, repleted K 70mEq PPx: lovenox  Disposition:  home  Subjective:  Patient states he is feeling better and has been breathing better.  He says he has oxygen at home but hadn't been using it in a while.  He says he feels like he was getting a pneumonia a week before he came in.  He says he used to get them every 20 days before he started taking antibiotics every month.  He says he is 'ready to go home'.     Objective: Temp:  [98.5 F (36.9 C)-100.3 F (37.9 C)] 99.4 F (37.4 C) (05/05 0033) Pulse Rate:  [90-96] 92 (05/05 0033) Resp:  [18-20] 18 (05/04 1952) BP: (138-148)/(73-92) 148/81 (05/05 0033) SpO2:  [92 %-96 %] 96 % (05/05 0033) Physical Exam: GEN: alert and oriented. No acute distress.   CV: regular rhythm.  Normal rate.  No murmur.   PULM: significant rhonchi both sides posteriorly.  Decreased breath sounds in lower left side.   GI: soft, nontender. Normal bowel sounds  PSYCH: pleasant affect.      Laboratory: Recent Labs  Lab 10/06/18 2221 10/07/18 0425 10/08/18 0620  WBC 14.1* 13.7* 12.0*  HGB 14.7 12.2* 12.6*  HCT 45.7 37.7* 38.4*  PLT 210 179 194   Recent Labs  Lab 10/06/18 2221 10/07/18 0425 10/08/18 0620  NA 134* 142 139  K 3.7 4.0 3.7  CL 98 106 105  CO2 25 21* 25  BUN 15 12 12   CREATININE 1.05 0.78 0.95  CALCIUM 8.8* 8.3* 8.4*  PROT 8.1  --   --   BILITOT 1.2  --   --  ALKPHOS 83  --   --   ALT 18  --   --   AST 24  --   --   GLUCOSE 116* 91 105*    Imaging/Diagnostic Tests:   Benay Pike, MD 10/09/2018, 5:59 AM PGY-1, [place] Intern pager: [phone], text pages welcome

## 2018-10-09 NOTE — Progress Notes (Signed)
SATURATION QUALIFICATIONS: (This note is used to comply with regulatory documentation for home oxygen)  Patient Saturations on Room Air at Rest = 91%  Patient Saturations on Room Air while Ambulating = 87%  Patient Saturations on 1 Liters of oxygen while Ambulating = 89-92%
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
## 2018-10-10 ENCOUNTER — Telehealth: Payer: Self-pay

## 2018-10-10 LAB — CBC
HCT: 36.8 % — ABNORMAL LOW (ref 39.0–52.0)
Hemoglobin: 12.1 g/dL — ABNORMAL LOW (ref 13.0–17.0)
MCH: 28.4 pg (ref 26.0–34.0)
MCHC: 32.9 g/dL (ref 30.0–36.0)
MCV: 86.4 fL (ref 80.0–100.0)
Platelets: 190 10*3/uL (ref 150–400)
RBC: 4.26 MIL/uL (ref 4.22–5.81)
RDW: 12.4 % (ref 11.5–15.5)
WBC: 6.4 10*3/uL (ref 4.0–10.5)
nRBC: 0 % (ref 0.0–0.2)

## 2018-10-10 LAB — BASIC METABOLIC PANEL
Anion gap: 11 (ref 5–15)
BUN: 11 mg/dL (ref 8–23)
CO2: 30 mmol/L (ref 22–32)
Calcium: 8.4 mg/dL — ABNORMAL LOW (ref 8.9–10.3)
Chloride: 100 mmol/L (ref 98–111)
Creatinine, Ser: 0.8 mg/dL (ref 0.61–1.24)
GFR calc Af Amer: >60 mL/min (ref >60)
GFR calc non Af Amer: >60 mL/min (ref >60)
Glucose, Bld: 114 mg/dL — ABNORMAL HIGH (ref 70–99)
Potassium: 3.9 mmol/L (ref 3.5–5.1)
Sodium: 141 mmol/L (ref 135–145)

## 2018-10-10 MED ORDER — IBUPROFEN 200 MG PO TABS
400.0000 mg | ORAL_TABLET | Freq: Once | ORAL | Status: AC
  Administered 2018-10-10: 400 mg via ORAL
  Filled 2018-10-10: qty 2

## 2018-10-10 MED ORDER — LEVOFLOXACIN 750 MG PO TABS: 750.0000 mg | ORAL_TABLET | Freq: Every day | ORAL | 0 refills | Status: AC

## 2018-10-10 NOTE — TOC Transition Note (Signed)
Transition of Care [facility]) - CM/SW Discharge Note   Patient Details  Name: Jeffrey Hines MRN: [id number] Date of Birth: 1945-09-13  Transition of Care [facility]) CM/SW Contact:  Zenon Mayo, RN Phone Number: 10/10/2018, 10:32 AM   Clinical Narrative:    From home with wife, NCM received order for home oxygen, NCM spoke with patient to get home oxygen set up, he states he has his own home oxygen that he brought from [employer] and he does not want any other oxygen.  He states his wife will transport him home and he has his oxygen with him in the room.  He has no issues with getting his medications.  He goes to [facility], his PCP is Dr. Domenica Fail, the [facility]le[facility] is Glean Hess [phone] ext [phone], pager is [phone].     Final next level of care: Home/Self Care Barriers to Discharge: No Barriers Identified   Patient Goals and CMS Choice Patient states their goals for this hospitalization and ongoing recovery are:: to go home and be able to mow the lawn.   Choice offered to / list presented to : NA  Discharge Placement                       Discharge Plan and Services In-house Referral: NA Discharge Planning Services: CM Consult Post Acute Care Choice: NA          DME Arranged: Oxygen DME Agency: NA Date DME Agency Contacted: 10/10/18 Time DME Agency Contacted: 33 Representative spoke with at DME Agency: zack(patient states he has his own oxygen and does not want oxygen with [employer])            Social Determinants of Health (SDOH) Interventions     Readmission Risk Interventions Readmission Risk Prevention Plan 10/10/2018  Post Dischage Appt Complete  Medication Screening Complete  Transportation Screening Complete  Some recent data might be hidden

## 2018-10-10 NOTE — Care Management Important Message (Signed)
Important Message  Patient Details  Name: Jeffrey Hines MRN: [id number] Date of Birth: 1945-10-25   Medicare Important Message Given:  Yes    Zenon Mayo, RN 10/10/2018, 11:10 AM

## 2018-10-10 NOTE — Discharge Instructions (Addendum)
Dear Jeffrey Hines,   Thank you for letting us participate in your care! In this section, you will find a brief hospital admission summary of why you were admitted to the hospital, what happened during your admission, your diagnosis/diagnoses, and recommended follow up.   You were admitted because you were experiencing shortness of breath.   You were diagnosed with pneumonia.  You were treated with Levaquin.   You were discharged with a new order for Oxygen. You requested a specific type of oxygen machine, which you may have to speak to [place] for a specific model or what they can provide to meet your needs.   Please continue to take Levaquin through 10/15/18   DOCTOR'S APPOINTMENT & FOLLOW UP CARE INSTRUCTIONS  Future Appointments  Date Time Provider [place]  10/18/2018 10:00 AM Curt Bears, MD [facility] None     Thank you for choosing [facility]! Take care and be well!  [facility]  [place],  [address] [phone]

## 2018-10-10 NOTE — Evaluation (Signed)
Occupational Therapy Evaluation Patient Details Name: Huxley Shurley MRN: [id number] DOB: Jun 22, 1945 Today's Date: 10/10/2018    History of Present Illness Qunicy Higinbotham is a 73 y.o. male presenting with acute on chronic hypoxic respiratory failure secondary to PNA. COVID-19 test (-). PMH includes COPD, Stage II lung cancer, post-obstructive PNA due to pseudomonas.   Clinical Impression   Pt PTA: independent with mobility and ADL. Pt currently performing ADL functional mobility and transfers with no AD and modified independence with good balance. O2 sats do desat to 86% on RA after 100' exertion requiring 1 minute of recovery to increase >90% O2. Pt modified independent with ADL at this time. Pt performing figure four technique for LB dressing for energy conservation. Pt does not require continued OT skilled services. Education performed on pursed lip breathing for O2, but pt reports that he is unable to perform. OT signing off.    Follow Up Recommendations  No OT follow up;Supervision - Intermittent    Equipment Recommendations  None recommended by OT    Recommendations for Other Services       Precautions / Restrictions Precautions Precautions: Fall Restrictions Weight Bearing Restrictions: No      Mobility Bed Mobility               General bed mobility comments: sitting in recliner  Transfers Overall transfer level: Independent Equipment used: None Transfers: Sit to/from [employer] to Stand: Modified independent (Device/Increase time) Stand pivot transfers: Modified independent (Device/Increase time)       General transfer comment: no physical assist required    Balance Overall balance assessment: Mild deficits observed, not formally tested   Sitting balance-Leahy Scale: Good       Standing balance-Leahy Scale: Good                             ADL either performed or assessed with clinical judgement   ADL Overall ADL's  : At baseline                                       General ADL Comments: Pt aware of energy conservation techniques and has been living with COPD and lung CA for years.      Vision Baseline Vision/History: Wears glasses Wears Glasses: At all times Vision Assessment?: No apparent visual deficits     Perception     Praxis      Pertinent Vitals/Pain Pain Assessment: No/denies pain     Hand Dominance Right   Extremity/Trunk Assessment Upper Extremity Assessment Upper Extremity Assessment: Overall WFL for tasks assessed   Lower Extremity Assessment Lower Extremity Assessment: Overall WFL for tasks assessed   Cervical / Trunk Assessment Cervical / Trunk Assessment: Normal   Communication Communication Communication: HOH   Cognition Arousal/Alertness: Awake/alert Behavior During Therapy: WFL for tasks assessed/performed Overall Cognitive Status: Within Functional Limits for tasks assessed                                     General Comments  pt ad lib in room with nursing; O2 desats to 86% on RA w/p exertion. 1 minute to recover  >90%    Exercises     Shoulder Instructions      Home Living Family/patient expects to be discharged to::  Private residence Living Arrangements: Spouse/significant other Available Help at Discharge: Family;Available 24 hours/day Type of Home: House Home Access: Stairs to enter [employer] of Steps: 5 Entrance Stairs-Rails: Right Home Layout: One level     Bathroom Shower/Tub: Teacher, early years/pre: Standard                Prior Functioning/Environment                   OT Problem List: Decreased activity tolerance;Decreased safety awareness      OT Treatment/Interventions:      OT Goals(Current goals can be found in the care plan section) Acute Rehab OT Goals Patient Stated Goal: improve the pain and breathing  OT Frequency:     Barriers to D/C:             Co-evaluation              AM-PAC OT "6 Clicks" Daily Activity     Outcome Measure Help from another person eating meals?: None Help from another person taking care of personal grooming?: None Help from another person toileting, which includes using toliet, bedpan, or urinal?: None Help from another person bathing (including washing, rinsing, drying)?: A Little Help from another person to put on and taking off regular upper body clothing?: None Help from another person to put on and taking off regular lower body clothing?: A Little 6 Click Score: 22   End of Session Equipment Utilized During Treatment: Gait belt Nurse Communication: Mobility status  Activity Tolerance: Patient tolerated treatment well Patient left: in chair;with call bell/phone within reach  OT Visit Diagnosis: Unsteadiness on feet (R26.81);Muscle weakness (generalized) (M62.81)                Time: 1026-1040 OT Time Calculation (min): 14 min Charges:  OT General Charges $OT Visit: 1 Visit OT Evaluation $OT Eval Moderate Complexity: 1 Mod  Darryl Nestle) Marsa Aris OTR/L Acute Rehabilitation Services Pager: [phone] Office: [place] 10/10/2018, 4:13 PM

## 2018-10-11 LAB — CULTURE, BLOOD (ROUTINE X 2)
Culture: NO GROWTH
Culture: NO GROWTH
Special Requests: ADEQUATE
Special Requests: ADEQUATE

## 2018-10-16 ENCOUNTER — Telehealth: Payer: Self-pay | Admitting: Pulmonary Disease

## 2018-10-16 NOTE — Telephone Encounter (Signed)
Received a [employer] RX for patient to have an POC. Per previous notes, patient already has a POC through [employer] but is it not clear how many liters he is currently using. Just states that he is using 2L at night. Left message for patient to call back to clarify.   Per the 09/20/18 note, patient wants to upgrade to a new machine from [employer].   BQ, are you ok with me stamping your name on the RX for a new [employer] for the patient? Thanks!

## 2018-10-17 NOTE — Telephone Encounter (Signed)
OK 

## 2018-10-18 ENCOUNTER — Encounter: Payer: Self-pay | Admitting: Internal Medicine

## 2018-10-18 ENCOUNTER — Other Ambulatory Visit: Payer: Self-pay

## 2018-10-18 ENCOUNTER — Inpatient Hospital Stay: Payer: Medicare Other | Attending: Internal Medicine | Admitting: Internal Medicine

## 2018-10-18 VITALS — BP 142/86 | HR 90 | Temp 97.7°F | Resp 20 | Ht 70.0 in | Wt 170.9 lb

## 2018-10-18 DIAGNOSIS — R05 Cough: Secondary | ICD-10-CM

## 2018-10-18 DIAGNOSIS — R0609 Other forms of dyspnea: Secondary | ICD-10-CM | POA: Diagnosis not present

## 2018-10-18 DIAGNOSIS — J189 Pneumonia, unspecified organism: Secondary | ICD-10-CM | POA: Diagnosis not present

## 2018-10-18 DIAGNOSIS — C3490 Malignant neoplasm of unspecified part of unspecified bronchus or lung: Secondary | ICD-10-CM

## 2018-10-18 DIAGNOSIS — Z85118 Personal history of other malignant neoplasm of bronchus and lung: Secondary | ICD-10-CM | POA: Insufficient documentation

## 2018-10-18 DIAGNOSIS — C3492 Malignant neoplasm of unspecified part of left bronchus or lung: Secondary | ICD-10-CM

## 2018-10-18 NOTE — Telephone Encounter (Signed)
Noted. Will go ahead and fill out paperwork and fax to [employer].    Will close this encounter.

## 2018-10-18 NOTE — Telephone Encounter (Signed)
Pt states he uses 2 Liters of oxygen during the day. Message will be forward to [place].

## 2018-10-18 NOTE — Telephone Encounter (Signed)
Will complete form and fax back to [employer] once patient returns the call to state how many liters of O2 he is using during the day.
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
## 2018-10-18 NOTE — Telephone Encounter (Signed)
Patient is returning phone call.  Phone number is [phone].

## 2018-10-19 ENCOUNTER — Telehealth: Payer: Self-pay | Admitting: Internal Medicine

## 2018-10-19 NOTE — Telephone Encounter (Signed)
F/u appt per 5/14 los - f/u in 3 months. Pt getting letter mailed with appt date and time

## 2018-10-24 ENCOUNTER — Other Ambulatory Visit: Payer: Self-pay | Admitting: Nurse Practitioner

## 2018-10-30 ENCOUNTER — Telehealth: Payer: Self-pay | Admitting: Pulmonary Disease

## 2018-10-30 NOTE — Telephone Encounter (Signed)
Primary Pulmonologist: BQ Last office visit and with whom: 09/07/18 with [place] What do we see them for (pulmonary problems): COPD and Resp Failure Last OV assessment/plan: Continue Requip 1 hour before bedtime Initial: 0.5 mg daily, and after 7 days to 1 mg daily. Dose may be further titrated upward in 0.5 mg increments every week until reaching a daily dose of 3 mg during week 6. Daily dose may be increased to a maximum of 4 mg beginning week 7.  COPD: Continue Symbicort 2 puffs twice a day no matter how you feel  Partially occluded left lower lobe with bronchiectasis and recurrent exacerbations: Continue Augmentin the first 7 days of every other month, alternating with doxycycline the first 7 days of alternating months Use a flutter valve twice a day Call me if you have increasing chest congestion or mucus production I am glad you have had a flu shot  Stay active  We will see you back in 4 months or sooner if needed  Was appointment offered to patient (explain)?  Patient was offered an appointment for this afternoon at 430 BUT REFUSED. REQUESTING RECOMMENDATIONS FIRST. PREFERS TO HAVE LEVAQUIN CALLED IN FIRST.    Reason for call: Spoke with patient. He stated that he believes he is having a COPD flare up. He has increased SOB for the past days. Has a productive cough with clear mucus (using the flutter valve for this). He is still using 2-3 L of O2. He is still taking the Doxy and Augmentin every other month. Still using Symbicort 160 twice daily. Denies any fever, body aches.   Patient refused appointment multiple times on phone. Stated that he just wanted to have Levaquin called in.   Pharmacy is [facility] in [place].   TP please advise.

## 2018-10-30 NOTE — Telephone Encounter (Signed)
im sorry he will need video visit for further evaluation   Please contact office for sooner follow up if symptoms do not improve or worsen or seek emergency care

## 2018-10-30 NOTE — Telephone Encounter (Signed)
Called and spoke with pt letting him know that we needed to get him scheduled for visit to further evaluate symptoms and could not just send in Rx levaquin. Pt expressed understanding. Pt has been scheduled for mychart video visit tomorrow, 5/27 with Lazaro Arms at 3pm. Nothing further needed.

## 2018-10-31 ENCOUNTER — Other Ambulatory Visit: Payer: Self-pay

## 2018-10-31 ENCOUNTER — Encounter: Payer: Self-pay | Admitting: Nurse Practitioner

## 2018-10-31 ENCOUNTER — Ambulatory Visit (INDEPENDENT_AMBULATORY_CARE_PROVIDER_SITE_OTHER): Payer: Medicare Other | Admitting: Nurse Practitioner

## 2018-10-31 DIAGNOSIS — J189 Pneumonia, unspecified organism: Secondary | ICD-10-CM | POA: Diagnosis not present

## 2018-10-31 DIAGNOSIS — R05 Cough: Secondary | ICD-10-CM

## 2018-10-31 NOTE — Assessment & Plan Note (Signed)
Patient states that he has had increased shortness of breath for the past couple weeks.  He has a productive cough that is productive of clear mucus.  He is compliant with flutter valve.  He uses 2 to 3 L of O2.  Patient is currently on doxycycline and Augmentin every other month.  He uses Symbicort as directed.  Patient was recently admitted to the hospital on 10/06/2018 for pneumonia.  Was sent home on Levaquin.  He states that he did feel improved but unfortunately has started to feel worse again since he is off the medication.  He states that 2 nights ago he did have a fever of 101.0 F, but over the past 2 days he has not had any fever.   Note: recent CT scan was concerning in hospital. Patient has followed up with Dr. Julien Nordmann and he recommended for patient, if he has no improvement in his condition, to reach out to Dr. Lake Bells for consideration of bronchoscopy and to rule out any underlying malignancy. He will see him back for follow-up visit in 3 months for evaluation with repeat CT scan of the chest for restaging of his disease. I will discuss this with Dr. Lake Bells for further recommendations.   Patient Instructions  Will order levaquin Please keep follow up with Dr. Julien Nordmann as scheduled Continue flutter valve  Continue to alternate Augmentin and doxycycline as directed. Will discuss recent CT with Dr. Lake Bells for further recommendations  Follow up: Please follow up with Dr. Lake Bells in 3 months or sooner if needed

## 2018-10-31 NOTE — Patient Instructions (Addendum)
Will order levaquin Please keep follow up with Dr. Julien Nordmann as scheduled Continue flutter valve  Continue to alternate Augmentin and doxycycline as directed. Will discuss recent CT with Dr. Lake Bells for further recommendations  Follow up: Please follow up with Dr. Lake Bells in 3 months or sooner if needed

## 2018-10-31 NOTE — Progress Notes (Signed)
Virtual Visit via Telephone Note  I connected with Jeffrey Hines on 10/31/18 at  3:00 PM EDT by telephone and verified that I am speaking with the correct person using two identifiers.  Location: Patient: home Provider: office   I discussed the limitations, risks, security and privacy concerns of performing an evaluation and management service by telephone and the availability of in person appointments. I also discussed with the patient that there may be a patient responsible charge related to this service. The patient expressed understanding and agreed to proceed.   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 Lake Bells.   Patient has a tele-visit today for an acute visit.  He states that he has had increased shortness of breath for the past couple weeks.  He has a productive cough that is productive of clear mucus.  He is compliant with flutter valve.  He uses 2 to 3 L of O2.  Patient is currently on doxycycline and Augmentin every other month.  He uses Symbicort as directed.  Patient was recently admitted to the hospital on 10/06/2018 for pneumonia.  Was sent home on Levaquin.  He states that he did feel improved but unfortunately has started to feel worse again since he is off the medication.  He states that 2 nights ago he did have a fever of 101.0 F, but over the past 2 days he has not had any fever. Denies n/v/d, hemoptysis, PND, leg swelling.    Observations/Objective: CT chest 10/08/18 - Bronchopneumonia of the right lower lobe.  There is increasing consolidation/volume loss and ill-defined nodularity of the residual left lung, predominantly in the dependent regions. Given the presence of debris within the left mainstem bronchus and the absence of a continuous air column from the left main stem into the residual bronchi, this is  favored to be postobstructive infection/inflammation, potentially from chronic aspiration. Correlation with bronchoscopy may be considered, as endoluminal tumor or chronic stricture cannot be excluded.  Treatment changes of the left chest are similar to the comparison, with surgical suture lines in the hilum from prior with wedge resection/lobectomy and radiation therapy, residual fluid/tissue at the left apex, and significant right to left mediastinal shift.  Multiple mediastinal lymph nodes, presumably reactive given the presence of bronchopneumonia.  PRIOR THERAPY:  1) Concurrent chemoradiation with chemotherapy the form of weekly carboplatin for AUC 2 and  paclitaxel at 45 mg/m. Status post 5 cycles of chemotherapy last dose was given 09/22/2014 with partial response. 2) Left video-assisted thoracoscopy, wedge resection of left lower lobe and thoracoscopic left upper lobectomy with mediastinal lymph node dissection under the care of Dr. Roxan Hockey on 12/15/2014.  CURRENT THERAPY: Observation.  Assessment and Plan: Patient states that he has had increased shortness of breath for the past couple weeks.  He has a productive cough that is productive of clear mucus.  He is compliant with flutter valve.  He uses 2 to 3 L of O2.  Patient is currently on doxycycline and Augmentin every other month.  He uses Symbicort as directed.  Patient was recently admitted to the hospital on 10/06/2018 for pneumonia.  Was sent home on Levaquin.  He states that he did feel improved but unfortunately has started to feel worse again since he is off the medication.  He states that 2 nights ago he did have a fever of 101.0 F, but over the past 2 days  he has not had any fever.   Note: recent CT scan was concerning in hospital. Patient has followed up with Dr. Julien Nordmann and he recommended for patient, if he has no improvement in his condition, to reach out to Dr. Lake Bells for consideration of bronchoscopy and to rule  out any underlying malignancy. He will see him back for follow-up visit in 3 months for evaluation with repeat CT scan of the chest for restaging of his disease. I will discuss this with Dr. Lake Bells for further recommendations.   Patient Instructions  Will order levaquin Please keep follow up with Dr. Julien Nordmann as scheduled Continue flutter valve  Continue to alternate Augmentin and doxycycline as directed. Will discuss recent CT with Dr. Lake Bells for further recommendations    Follow Up Instructions: Please follow up with Dr. Lake Bells in 3 months or sooner if needed   I discussed the assessment and treatment plan with the patient. The patient was provided an opportunity to ask questions and all were answered. The patient agreed with the plan and demonstrated an understanding of the instructions.   The patient was advised to call back or seek an in-person evaluation if the symptoms worsen or if the condition fails to improve as anticipated.  I provided 23 minutes of non-face-to-face time during this encounter.   Fenton Foy, NP

## 2018-10-31 NOTE — Progress Notes (Signed)
This has proven to be a difficult problem to treat over the years since his surgery. I would recommend that he complete a 14 day course of antibiotics now and perform a repeat CT chest mid June If the nodularity/mass has worsened by that point then I can perform a bronchoscopy If he has no recovery prior to that though I'm happy to consider a bronchoscopy sooner, but my suspcion is he needs a prolonged course of antibiotics He needs to focus on mucociliary clearance: hypertonic saline, flutter valve, using CPAP at night.

## 2018-11-02 ENCOUNTER — Telehealth: Payer: Self-pay | Admitting: Nurse Practitioner

## 2018-11-02 DIAGNOSIS — J189 Pneumonia, unspecified organism: Secondary | ICD-10-CM

## 2018-11-02 MED ORDER — LEVOFLOXACIN 500 MG PO TABS: 500.0000 mg | ORAL_TABLET | Freq: Every day | ORAL | 0 refills | Status: AC

## 2018-11-02 NOTE — Telephone Encounter (Signed)
Progress Notes by Fenton Foy, NP at 10/31/2018 3:00 PM  Author: Fenton Foy, NP Author Type: Nurse Practitioner Filed: 11/02/2018 2:06 PM  Note Status: Signed Cosign: Cosign Not Required Encounter Date: 10/31/2018  Editor: Fenton Foy, NP (Nurse Practitioner)    Please call to let patient know that I have discussed his CT scan with Dr. Lake Bells and he recommends:  14 day course of antibiotics now (I have ordered this)  perform a repeat CT chest mid June (please order)  If the area of concern has worsened by that point then Dr. Lake Bells can perform a bronchoscopy If he has no recovery prior to that though - would consider a bronchoscopy sooner  He needs to focus on mucociliary clearance: hypertonic saline, flutter valve, using CPAP at night.  I have tried to contact the patient twice with no answer. Please try to get this message to him. Thanks.      Called and spoke with pt letting him know the info stated by Kenney Houseman that the abx was sent to pharmacy for him and that we were going to repeat CT mid June and if the area of concern had worsened that BQ would probably perform bronch and pt verbalized understanding. Order has been placed for CT. Nothing further needed.

## 2018-11-02 NOTE — Addendum Note (Signed)
Addended by: Fenton Foy on: 11/02/2018 02:01 PM   Modules accepted: Orders

## 2018-11-02 NOTE — Progress Notes (Signed)
Please call to let patient know that I have discussed his CT scan with Dr. Lake Bells and he recommends:  14 day course of antibiotics now (I have ordered this)  perform a repeat CT chest mid June (please order)  If the area of concern has worsened by that point then Dr. Lake Bells can perform a bronchoscopy If he has no recovery prior to that though - would consider a bronchoscopy sooner  He needs to focus on mucociliary clearance: hypertonic saline, flutter valve, using CPAP at night.  I have tried to contact the patient twice with no answer. Please try to get this message to him. Thanks.

## 2018-11-13 ENCOUNTER — Other Ambulatory Visit: Payer: Self-pay | Admitting: Pulmonary Disease

## 2018-11-14 ENCOUNTER — Telehealth: Payer: Self-pay | Admitting: Pulmonary Disease

## 2018-11-14 DIAGNOSIS — J189 Pneumonia, unspecified organism: Secondary | ICD-10-CM

## 2018-11-14 NOTE — Telephone Encounter (Signed)
Yes. The scan was supposed to be ordered with contrast. Yes. It was supposed to be scheduled mid June. It might have gotten pushed out due to covid. Please correct order. Thanks.

## 2018-11-14 NOTE — Telephone Encounter (Signed)
Call returned to patient, made aware of [place] recommendations. Voiced understanding. New order for chest ct with contrast placed. Nothing further is needed at this time.

## 2018-11-14 NOTE — Telephone Encounter (Signed)
Spoke with pt, he has two questions. He wants to know if the CT scan needed to be without contrast since his previous CT was done with contrast. He also would like to know if the CT should be done in the middle of the month once he finishes the ABX. I advised him that it was probably pushed out due to the Covid-19 pandemic. Please advise     Patient Instructions by Fenton Foy, NP at 10/31/2018 3:00 PM  Author: Fenton Foy, NP Author Type: Nurse Practitioner Filed: 10/31/2018 4:37 PM  Note Status: Addendum Cosign: Cosign Not Required Encounter Date: 10/31/2018  Editor: Fenton Foy, NP (Nurse Practitioner)  Prior Versions: 1. Fenton Foy, NP (Nurse Practitioner) at 10/31/2018 4:35 PM - Signed    Will order levaquin Please keep follow up with Dr. Julien Nordmann as scheduled Continue flutter valve  Continue to alternate Augmentin and doxycycline as directed. Will discuss recent CT with Dr. Lake Bells for further recommendations  Follow up: Please follow up with Dr. Lake Bells in 3 months or sooner if needed

## 2018-11-20 ENCOUNTER — Telehealth: Payer: Self-pay

## 2018-11-20 DIAGNOSIS — J449 Chronic obstructive pulmonary disease, unspecified: Secondary | ICD-10-CM

## 2018-11-20 NOTE — Telephone Encounter (Signed)
LMTCB. Will send message to triage for f/u. BMet order has been placed.

## 2018-11-20 NOTE — Telephone Encounter (Signed)
ATC Patient.  Left message for Patient to call back.

## 2018-11-20 NOTE — Telephone Encounter (Signed)
-----   Message from Ilona Sorrel sent at 11/15/2018  8:07 AM EDT ----- Regarding: BMET for CT Hello.  You put in order yesterday for pt to have CT Chest with contrast instead of CT Chest without.  He has already been scheduled for 7/2 at 10:15.  He is going to need labs.  Can you please put in an order for a BMET.    Thanks! Judeen Hammans

## 2018-11-22 NOTE — Telephone Encounter (Signed)
LMTCB

## 2018-11-23 NOTE — Telephone Encounter (Signed)
Pt returning call.  [phone]

## 2018-11-23 NOTE — Telephone Encounter (Signed)
Called & spoke w/ pt about getting labs done before his CT chest w/ Contrast. I let pt know he can come to the [facility] office to get this lab drawn anytime before his CT scan scheduled for 12/06/2018. Pt verbalized understanding with no additional questions.   Order for BMET has already been placed. Nothing further needed at this time.

## 2018-11-28 ENCOUNTER — Telehealth: Payer: Self-pay | Admitting: General Practice

## 2018-11-28 ENCOUNTER — Telehealth: Payer: Self-pay | Admitting: *Deleted

## 2018-11-28 ENCOUNTER — Telehealth: Payer: Self-pay | Admitting: Nurse Practitioner

## 2018-11-28 DIAGNOSIS — Z20822 Contact with and (suspected) exposure to covid-19: Secondary | ICD-10-CM

## 2018-11-28 NOTE — Telephone Encounter (Signed)
Called pt, lvm to return call to schedule covid testing.

## 2018-11-28 NOTE — Telephone Encounter (Signed)
Called and spoke with pt letting him know the information stated by [place] and that he needs to be retested for COVID again. Also stated to pt if he did become worse that he needed to go to ED for further evaluation. Pt verbalized understanding.  Routing to [facility] testing pool so they can get pt scheduled for COVID test.

## 2018-11-28 NOTE — Telephone Encounter (Signed)
Patient will need to be tested for covid again. Please place order for testing center. Take tylenol for fever.

## 2018-11-28 NOTE — Telephone Encounter (Signed)
Primary Pulmonologist: BQ Last office visit and with whom: 10/31/2018 with [place] What do we see them for (pulmonary problems): pna Last OV assessment/plan: Instructions    Return for follow up. Will order levaquin Please keep follow up with Dr. Julien Nordmann as scheduled Continue flutter valve  Continue to alternate Augmentin and doxycycline as directed. Will discuss recent CT with Dr. Lake Bells for further recommendations  Follow up: Please follow up with Dr. Lake Bells in 3 months or sooner if needed     Was appointment offered to patient (explain)?  Pt wants recommendations   Reason for call: called and spoke with pt who stated since stopping the levaquin 6/11, he has not felt the same. Pt has been coughing up green phlegm, and had a temp last night 6/23 ranging from 101-102. Pt stated he did take some ibuprofen and used his oxygen. This morning his temp was 97.7. pt is currently doing 2L of O2.  Pt states he takes claritin twice daily and also has been using his symbicort as well as the flutter valve.  Pt stated yesterday when he ran the high fever, he did feel weak and has had body aches.  Pt was tested for COVID 5/3 when he was in the hospital and those results were negative. Pt is wanting recommendations to help with his symptoms. Tonya, please advise on this for pt. Thank you!

## 2018-11-28 NOTE — Telephone Encounter (Signed)
.   Pt has been coughing up green phlegm, and had a temp last night 6/23 ranging from 101-102.  Pt stated yesterday when he ran the high fever, he did feel weak and has had body aches.  Pt was tested for COVID 5/3 when he was in the hospital and those results were negative.   Pt scheduled for testing tomorrow at [facility] site. Testing process reviewed, stay in car, wear ask. Pt verbalizes understanding.   Requested by [facility].  Pts CB# [phone]

## 2018-11-29 ENCOUNTER — Other Ambulatory Visit: Payer: Self-pay

## 2018-11-29 DIAGNOSIS — R6889 Other general symptoms and signs: Secondary | ICD-10-CM | POA: Diagnosis not present

## 2018-11-29 DIAGNOSIS — Z20822 Contact with and (suspected) exposure to covid-19: Secondary | ICD-10-CM

## 2018-11-29 DIAGNOSIS — J449 Chronic obstructive pulmonary disease, unspecified: Secondary | ICD-10-CM

## 2018-12-03 LAB — NOVEL CORONAVIRUS, NAA: SARS-CoV-2, NAA: NOT DETECTED

## 2018-12-05 ENCOUNTER — Other Ambulatory Visit: Payer: Self-pay

## 2018-12-05 ENCOUNTER — Other Ambulatory Visit (INDEPENDENT_AMBULATORY_CARE_PROVIDER_SITE_OTHER): Payer: Medicare Other

## 2018-12-05 ENCOUNTER — Telehealth: Payer: Self-pay | Admitting: *Deleted

## 2018-12-05 DIAGNOSIS — J449 Chronic obstructive pulmonary disease, unspecified: Secondary | ICD-10-CM

## 2018-12-05 LAB — BASIC METABOLIC PANEL
BUN: 13 mg/dL (ref 6–23)
CO2: 30 mEq/L (ref 19–32)
Calcium: 8.7 mg/dL (ref 8.4–10.5)
Chloride: 102 mEq/L (ref 96–112)
Creatinine, Ser: 0.76 mg/dL (ref 0.40–1.50)
GFR: 100.71 mL/min (ref >60.00)
Glucose, Bld: 84 mg/dL (ref 70–99)
Potassium: 3.5 mEq/L (ref 3.5–5.1)
Sodium: 140 mEq/L (ref 135–145)

## 2018-12-05 NOTE — Telephone Encounter (Signed)

## 2018-12-06 ENCOUNTER — Other Ambulatory Visit: Payer: Self-pay

## 2018-12-06 ENCOUNTER — Ambulatory Visit (INDEPENDENT_AMBULATORY_CARE_PROVIDER_SITE_OTHER)
Admission: RE | Admit: 2018-12-06 | Discharge: 2018-12-06 | Disposition: A | Discharge status: Home / Self Care | Payer: Medicare Other | Source: Ambulatory Visit | Attending: Nurse Practitioner | Admitting: Nurse Practitioner

## 2018-12-06 ENCOUNTER — Telehealth: Payer: Self-pay | Admitting: Nurse Practitioner

## 2018-12-06 DIAGNOSIS — J189 Pneumonia, unspecified organism: Secondary | ICD-10-CM | POA: Diagnosis not present

## 2018-12-06 DIAGNOSIS — R911 Solitary pulmonary nodule: Secondary | ICD-10-CM | POA: Diagnosis not present

## 2018-12-06 MED ORDER — IOHEXOL 300 MG/ML  SOLN
80.0000 mL | Freq: Once | INTRAMUSCULAR | Status: AC | PRN
  Administered 2018-12-06: 80 mL via INTRAVENOUS

## 2018-12-06 NOTE — Telephone Encounter (Signed)
Called patient back and advised him of normal results. Nothing further needed.

## 2018-12-10 ENCOUNTER — Telehealth: Payer: Self-pay | Admitting: Pulmonary Disease

## 2018-12-10 ENCOUNTER — Other Ambulatory Visit: Payer: Self-pay | Admitting: General Surgery

## 2018-12-10 DIAGNOSIS — R911 Solitary pulmonary nodule: Secondary | ICD-10-CM

## 2018-12-10 NOTE — Telephone Encounter (Signed)
Patient wife contacted regarding the CT and given response per Dr. Lake Bells. Information noted on the result note. Nothing further needed.

## 2018-12-20 ENCOUNTER — Telehealth: Payer: Self-pay | Admitting: Internal Medicine

## 2018-12-20 NOTE — Telephone Encounter (Signed)
R/s appt per 7/15 sch msg - unable to reach pt left message with appt date and time

## 2019-01-18 ENCOUNTER — Other Ambulatory Visit: Payer: Self-pay

## 2019-01-18 ENCOUNTER — Ambulatory Visit (HOSPITAL_COMMUNITY)
Admission: RE | Admit: 2019-01-18 | Discharge: 2019-01-18 | Disposition: A | Discharge status: Home / Self Care | Payer: Medicare Other | Source: Ambulatory Visit | Attending: Internal Medicine | Admitting: Internal Medicine

## 2019-01-18 ENCOUNTER — Inpatient Hospital Stay: Payer: Medicare Other | Attending: Internal Medicine

## 2019-01-18 ENCOUNTER — Other Ambulatory Visit: Payer: Medicare Other

## 2019-01-18 ENCOUNTER — Encounter (HOSPITAL_COMMUNITY): Payer: Self-pay

## 2019-01-18 DIAGNOSIS — C3490 Malignant neoplasm of unspecified part of unspecified bronchus or lung: Secondary | ICD-10-CM | POA: Diagnosis not present

## 2019-01-18 DIAGNOSIS — Z85118 Personal history of other malignant neoplasm of bronchus and lung: Secondary | ICD-10-CM | POA: Diagnosis not present

## 2019-01-18 DIAGNOSIS — J479 Bronchiectasis, uncomplicated: Secondary | ICD-10-CM | POA: Diagnosis not present

## 2019-01-18 DIAGNOSIS — Z9981 Dependence on supplemental oxygen: Secondary | ICD-10-CM | POA: Insufficient documentation

## 2019-01-18 DIAGNOSIS — R0602 Shortness of breath: Secondary | ICD-10-CM | POA: Insufficient documentation

## 2019-01-18 DIAGNOSIS — J9811 Atelectasis: Secondary | ICD-10-CM | POA: Diagnosis not present

## 2019-01-18 DIAGNOSIS — J449 Chronic obstructive pulmonary disease, unspecified: Secondary | ICD-10-CM | POA: Insufficient documentation

## 2019-01-18 DIAGNOSIS — J439 Emphysema, unspecified: Secondary | ICD-10-CM | POA: Diagnosis not present

## 2019-01-18 DIAGNOSIS — R05 Cough: Secondary | ICD-10-CM | POA: Insufficient documentation

## 2019-01-18 DIAGNOSIS — R030 Elevated blood-pressure reading, without diagnosis of hypertension: Secondary | ICD-10-CM | POA: Insufficient documentation

## 2019-01-18 LAB — CBC WITH DIFFERENTIAL (CANCER CENTER ONLY)
Abs Immature Granulocytes: 0.03 10*3/uL (ref 0.00–0.07)
Basophils Absolute: 0 10*3/uL (ref 0.0–0.1)
Basophils Relative: 0 %
Eosinophils Absolute: 0.3 10*3/uL (ref 0.0–0.5)
Eosinophils Relative: 4 %
HCT: 40.8 % (ref 39.0–52.0)
Hemoglobin: 13.4 g/dL (ref 13.0–17.0)
Immature Granulocytes: 0 %
Lymphocytes Relative: 14 %
Lymphs Abs: 0.9 10*3/uL (ref 0.7–4.0)
MCH: 28 pg (ref 26.0–34.0)
MCHC: 32.8 g/dL (ref 30.0–36.0)
MCV: 85.4 fL (ref 80.0–100.0)
Monocytes Absolute: 0.8 10*3/uL (ref 0.1–1.0)
Monocytes Relative: 12 %
Neutro Abs: 4.8 10*3/uL (ref 1.7–7.7)
Neutrophils Relative %: 70 %
Platelet Count: 254 10*3/uL (ref 150–400)
RBC: 4.78 MIL/uL (ref 4.22–5.81)
RDW: 13.5 % (ref 11.5–15.5)
WBC Count: 6.8 10*3/uL (ref 4.0–10.5)
nRBC: 0 % (ref 0.0–0.2)

## 2019-01-18 LAB — CMP (CANCER CENTER ONLY)
ALT: 15 U/L (ref 0–44)
AST: 19 U/L (ref 15–41)
Albumin: 3.7 g/dL (ref 3.5–5.0)
Alkaline Phosphatase: 73 U/L (ref 38–126)
Anion gap: 11 (ref 5–15)
BUN: 16 mg/dL (ref 8–23)
CO2: 27 mmol/L (ref 22–32)
Calcium: 9.1 mg/dL (ref 8.9–10.3)
Chloride: 103 mmol/L (ref 98–111)
Creatinine: 0.92 mg/dL (ref 0.61–1.24)
GFR, Est AFR Am: >60 mL/min (ref >60)
GFR, Estimated: >60 mL/min (ref >60)
Glucose, Bld: 92 mg/dL (ref 70–99)
Potassium: 3.6 mmol/L (ref 3.5–5.1)
Sodium: 141 mmol/L (ref 135–145)
Total Bilirubin: 0.5 mg/dL (ref 0.3–1.2)
Total Protein: 7.5 g/dL (ref 6.5–8.1)

## 2019-01-18 MED ORDER — SODIUM CHLORIDE (PF) 0.9 % IJ SOLN
INTRAMUSCULAR | Status: AC
  Filled 2019-01-18: qty 50

## 2019-01-18 MED ORDER — IOHEXOL 300 MG/ML  SOLN
75.0000 mL | Freq: Once | INTRAMUSCULAR | Status: AC | PRN
  Administered 2019-01-18: 75 mL via INTRAVENOUS

## 2019-01-21 ENCOUNTER — Telehealth: Payer: Self-pay | Admitting: Internal Medicine

## 2019-01-21 ENCOUNTER — Other Ambulatory Visit: Payer: Self-pay

## 2019-01-21 ENCOUNTER — Inpatient Hospital Stay (HOSPITAL_BASED_OUTPATIENT_CLINIC_OR_DEPARTMENT_OTHER): Payer: Medicare Other | Admitting: Physician Assistant

## 2019-01-21 ENCOUNTER — Encounter: Payer: Self-pay | Admitting: Physician Assistant

## 2019-01-21 VITALS — BP 163/96 | HR 90 | Temp 98.2°F | Resp 22 | Ht 70.0 in | Wt 166.6 lb

## 2019-01-21 DIAGNOSIS — R0602 Shortness of breath: Secondary | ICD-10-CM | POA: Diagnosis not present

## 2019-01-21 DIAGNOSIS — Z85118 Personal history of other malignant neoplasm of bronchus and lung: Secondary | ICD-10-CM | POA: Diagnosis not present

## 2019-01-21 DIAGNOSIS — R05 Cough: Secondary | ICD-10-CM | POA: Diagnosis not present

## 2019-01-21 DIAGNOSIS — R030 Elevated blood-pressure reading, without diagnosis of hypertension: Secondary | ICD-10-CM | POA: Diagnosis not present

## 2019-01-21 DIAGNOSIS — C3492 Malignant neoplasm of unspecified part of left bronchus or lung: Secondary | ICD-10-CM

## 2019-01-21 DIAGNOSIS — J449 Chronic obstructive pulmonary disease, unspecified: Secondary | ICD-10-CM | POA: Diagnosis not present

## 2019-01-21 DIAGNOSIS — Z9981 Dependence on supplemental oxygen: Secondary | ICD-10-CM | POA: Diagnosis not present
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
## 2019-01-21 NOTE — Telephone Encounter (Signed)
Scheduled appt per 8/17 los - mailed letter with appt date and time

## 2019-01-25 ENCOUNTER — Telehealth: Payer: Self-pay | Admitting: Pulmonary Disease

## 2019-01-25 ENCOUNTER — Other Ambulatory Visit: Payer: Self-pay

## 2019-01-25 ENCOUNTER — Telehealth: Payer: Self-pay | Admitting: Adult Health

## 2019-01-25 ENCOUNTER — Ambulatory Visit (INDEPENDENT_AMBULATORY_CARE_PROVIDER_SITE_OTHER): Payer: Medicare Other | Admitting: Adult Health

## 2019-01-25 ENCOUNTER — Encounter: Payer: Self-pay | Admitting: Adult Health

## 2019-01-25 DIAGNOSIS — J9611 Chronic respiratory failure with hypoxia: Secondary | ICD-10-CM

## 2019-01-25 DIAGNOSIS — J441 Chronic obstructive pulmonary disease with (acute) exacerbation: Secondary | ICD-10-CM | POA: Diagnosis not present

## 2019-01-25 MED ORDER — LEVOFLOXACIN 500 MG PO TABS: 500.0000 mg | ORAL_TABLET | Freq: Every day | ORAL | 0 refills | Status: AC

## 2019-01-25 MED ORDER — PREDNISONE 10 MG PO TABS: ORAL_TABLET | ORAL | 0 refills | Status: DC

## 2019-01-25 NOTE — Telephone Encounter (Signed)
Called spoke with patient he has been having increased shortness of breath, he is wearing his O2 during day at 2L not just at night.  patient is having a productive cough, green to yellow sputum, having headaches also. Patient also states he has not been out of house since his appt at the hospital with his oncologist on Monday 01/21/19. Patient denies fever or body aches. Patient does take ibuprofen 3x a day every day.   Patient scheduled for a televisit with TP at 1130 01/25/19

## 2019-01-25 NOTE — Patient Instructions (Addendum)
Begin Levquin 500mg  daily for 7 days , take with food.  Mucinex DM Twice daily  As needed cough/congestion  Prednisone taper over next week .  Saline nasal rinses As needed   Continue Symbicort 2 puffs Twice daily   Albuterol Neb every 4hrs as needed wheezing /shortness of breath  COVID 19 testing is recommended. - please call if you change your mind.  Continue on oxygen 2l/m with activity and At bedtime   Follow up with 1-2 weeks televisit and As needed   Please contact office for sooner follow up if symptoms do not improve or worsen or seek emergency care       Person Under Monitoring Name: Jeffrey Hines  Location: [place] [address]   Infection Prevention Recommendations for Individuals Confirmed to have, or Being Evaluated for, 2019 Novel Coronavirus (COVID-19) Infection Who Receive Care at Home  Individuals who are confirmed to have, or are being evaluated for, COVID-19 should follow the prevention steps below until a healthcare provider or local or state health department says they can return to normal activities.  Stay home except to get medical care You should restrict activities outside your home, except for getting medical care. Do not go to work, school, or public areas, and do not use public transportation or taxis.  Call ahead before visiting your doctor Before your medical appointment, call the healthcare provider and tell them that you have, or are being evaluated for, COVID-19 infection. This will help the healthcare provider's office take steps to keep other people from getting infected. Ask your healthcare provider to call the local or state health department.  Monitor your symptoms Seek prompt medical attention if your illness is worsening (e.g., difficulty breathing). Before going to your medical appointment, call the healthcare provider and tell them that you have, or are being evaluated for, COVID-19 infection. Ask your healthcare  provider to call the local or state health department.  Wear a facemask You should wear a facemask that covers your nose and mouth when you are in the same room with other people and when you visit a healthcare provider. People who live with or visit you should also wear a facemask while they are in the same room with you.  Separate yourself from other people in your home As much as possible, you should stay in a different room from other people in your home. Also, you should use a separate bathroom, if available.  Avoid sharing household items You should not share dishes, drinking glasses, cups, eating utensils, towels, bedding, or other items with other people in your home. After using these items, you should wash them thoroughly with soap and water.  Cover your coughs and sneezes Cover your mouth and nose with a tissue when you cough or sneeze, or you can cough or sneeze into your sleeve. Throw used tissues in a lined trash can, and immediately wash your hands with soap and water for at least 20 seconds or use an alcohol-based hand rub.  Wash your [facility] your hands often and thoroughly with soap and water for at least 20 seconds. You can use an alcohol-based hand sanitizer if soap and water are not available and if your hands are not visibly dirty. Avoid touching your eyes, nose, and mouth with unwashed hands.   Prevention Steps for Caregivers and Household Members of Individuals Confirmed to have, or Being Evaluated for, COVID-19 Infection Being Cared for in the Home  If you live with, or provide  care at home for, a person confirmed to have, or being evaluated for, COVID-19 infection please follow these guidelines to prevent infection:  Follow healthcare provider's instructions Make sure that you understand and can help the patient follow any healthcare provider instructions for all care.  Provide for the patient's basic needs You should help the patient with basic needs  in the home and provide support for getting groceries, prescriptions, and other personal needs.  Monitor the patient's symptoms If they are getting sicker, call his or her medical provider and tell them that the patient has, or is being evaluated for, COVID-19 infection. This will help the healthcare provider's office take steps to keep other people from getting infected. Ask the healthcare provider to call the local or state health department.  Limit the number of people who have contact with the patient  If possible, have only one caregiver for the patient.  Other household members should stay in another home or place of residence. If this is not possible, they should stay  in another room, or be separated from the patient as much as possible. Use a separate bathroom, if available.  Restrict visitors who do not have an essential need to be in the home.  Keep older adults, very young children, and other sick people away from the patient Keep older adults, very young children, and those who have compromised immune systems or chronic health conditions away from the patient. This includes people with chronic heart, lung, or kidney conditions, diabetes, and cancer.  Ensure good ventilation Make sure that shared spaces in the home have good air flow, such as from an air conditioner or an opened window, weather permitting.  Wash your hands often  Wash your hands often and thoroughly with soap and water for at least 20 seconds. You can use an alcohol based hand sanitizer if soap and water are not available and if your hands are not visibly dirty.  Avoid touching your eyes, nose, and mouth with unwashed hands.  Use disposable paper towels to dry your hands. If not available, use dedicated cloth towels and replace them when they become wet.  Wear a facemask and gloves  Wear a disposable facemask at all times in the room and gloves when you touch or have contact with the patient's blood,  body fluids, and/or secretions or excretions, such as sweat, saliva, sputum, nasal mucus, vomit, urine, or feces.  Ensure the mask fits over your nose and mouth tightly, and do not touch it during use.  Throw out disposable facemasks and gloves after using them. Do not reuse.  Wash your hands immediately after removing your facemask and gloves.  If your personal clothing becomes contaminated, carefully remove clothing and launder. Wash your hands after handling contaminated clothing.  Place all used disposable facemasks, gloves, and other waste in a lined container before disposing them with other household waste.  Remove gloves and wash your hands immediately after handling these items.  Do not share dishes, glasses, or other household items with the patient  Avoid sharing household items. You should not share dishes, drinking glasses, cups, eating utensils, towels, bedding, or other items with a patient who is confirmed to have, or being evaluated for, COVID-19 infection.  After the person uses these items, you should wash them thoroughly with soap and water.  Wash laundry thoroughly  Immediately remove and wash clothes or bedding that have blood, body fluids, and/or secretions or excretions, such as sweat, saliva, sputum, nasal mucus, vomit, urine,  or feces, on them.  Wear gloves when handling laundry from the patient.  Read and follow directions on labels of laundry or clothing items and detergent. In general, wash and dry with the warmest temperatures recommended on the label.  Clean all areas the individual has used often  Clean all touchable surfaces, such as counters, tabletops, doorknobs, bathroom fixtures, toilets, phones, keyboards, tablets, and bedside tables, every day. Also, clean any surfaces that may have blood, body fluids, and/or secretions or excretions on them.  Wear gloves when cleaning surfaces the patient has come in contact with.  Use a diluted bleach solution  (e.g., dilute bleach with 1 part bleach and 10 parts water) or a household disinfectant with a label that says EPA-registered for coronaviruses. To make a bleach solution at home, add 1 tablespoon of bleach to 1 quart (4 cups) of water. For a larger supply, add  cup of bleach to 1 gallon (16 cups) of water.  Read labels of cleaning products and follow recommendations provided on product labels. Labels contain instructions for safe and effective use of the cleaning product including precautions you should take when applying the product, such as wearing gloves or eye protection and making sure you have good ventilation during use of the product.  Remove gloves and wash hands immediately after cleaning.  Monitor yourself for signs and symptoms of illness Caregivers and household members are considered close contacts, should monitor their health, and will be asked to limit movement outside of the home to the extent possible. Follow the monitoring steps for close contacts listed on the symptom monitoring form.   ? If you have additional questions, contact your local health department or call the epidemiologist on call at [phone] (available 24/7). ? This guidance is subject to change. For the most up-to-date guidance from [facility], please refer to their website: [email or web address]

## 2019-01-25 NOTE — Telephone Encounter (Signed)
Patient had a televisit today with TP. She would like for the patient to have another televisit in 1wk.   Called patient twice this afternoon to get him schedule. He did not answer, left detailed messages for him to call back to get scheduled.  Will keep this encounter open to make sure he has been taken care of.

## 2019-01-25 NOTE — Progress Notes (Signed)
Virtual Visit via Telephone Note  I connected with Jeffrey Hines on 01/25/19 at 11:30 AM EDT by telephone and verified that I am speaking with the correct person using two identifiers.  Location: Patient: Home   Provider: Office    I discussed the limitations, risks, security and privacy concerns of performing an evaluation and management service by telephone and the availability of in person appointments. I also discussed with the patient that there may be a patient responsible charge related to this service. The patient expressed understanding and agreed to proceed.   History of Present Illness: 73 year old male former smoker followed for COPD, squamous cell carcinoma of the left upper lung diagnosed 2016 (status post chemo and radiation), Pseudomonas pneumonia and chronic respiratory failure on Oxygen 2l/m .   Today tele-visit is for an acute office visit. Complains of 4 days of sinus congestion, nasal drainage , sore throat , ear fullness , sinus pressure. Has started to cough up for thick nasty tasting green mucus. Having chest congestion now for last couple of days. Has increased DOE. No fever. No hemopytsis , no n/v/d . Appetite is okay . Does have some body aches.    Lives with wife. She is doing well. No known sick contacts. Has been staying home and is being very careful with pandemic.  Patient has underlying severe COPD that is oxygen dependent at 2l/m with activity and At bedtime.  He remains on Symbicort twice daily. Alternates Augmentin and Doxycycline first 7 days each month.   Previous on spiriva , incruse, says he was intolerant .  Observations/Objective: O2 sats 92% on room air .   Pseudomonas pneumonia. 2016  -Squama cell carcinoma 2016 status post chemotherapy and radiation. -March 2016 pulmonary function test> ratio 50%, FEV1 L (49% predicted, 18% change with bronchodilator), total lung capacity 6.73 L (93% predicted), DLCO 11.13 (33% predicted). June 2016 pulmonary  function testing ratio 46%, FEV1 2.47 L (71% Pred), total lung capacity 7.79 L (107% predicted), DLCO 10.62 (31% predicted). 11/2014 ONO < 88% 2 hours -March 2018 simple spirometry test: Ratio 42%, FEV1 1.16 L 34% predicted, FVC 2.7660% percent predicted -08/31/2016>Walked 500 feet on room air O2 saturation ranged between 91 and 94%  -March 2018 CT chest: Status post left upper lobectomy, some mild centrilobular emphysema, mild fibrotic change right lower lobe, bronchovascular distribution question aspiration, images independently reviewed -Records from the [place] facility reviewed were his sleep study showed restless leg syndrome, low oxygen when sleeping but no evidence of obstructive sleep apnea. -CT chest January 18, 2019 stable right middle lobe nodule at 6 mm, extensive posttreatment changes in the left upper lobe status post lobectomy and radiation.  Extensive bronchial wall thickening, bronchiectasis and tree-in-bud nodularity in the left lower lung   Assessment and Plan: COPD exacerbation with early sinusitis /Bronchitis  Chronic Respiratory Failure - cont on O2 .  Lung cancer - stable CT chest 01/18/19   Plan  Patient Instructions  Begin Levquin 500mg  daily for 7 days , take with food.  Mucinex DM Twice daily  As needed cough/congestion  Prednisone taper over next week .  Saline nasal rinses As needed   Continue Symbicort 2 puffs Twice daily   Albuterol Neb every 4hrs as needed wheezing /shortness of breath  COVID 19 testing is recommended. - please call if you change your mind.  Continue on oxygen 2l/m with activity and At bedtime   Follow up with 1-2 weeks televisit and As needed   Please contact office for  sooner follow up if symptoms do not improve or worsen or seek emergency care    .      Follow Up Instructions: Follow up in 1-2 weeks and As needed      I discussed the assessment and treatment plan with the patient. The patient was provided an opportunity to ask  questions and all were answered. The patient agreed with the plan and demonstrated an understanding of the instructions.   The patient was advised to call back or seek an in-person evaluation if the symptoms worsen or if the condition fails to improve as anticipated.  I provided 24 minutes of non-f ace-to-face time during this encounter.   Rexene Edison, NP

## 2019-01-27 NOTE — Progress Notes (Signed)
Reviewed, agree 

## 2019-02-07 ENCOUNTER — Other Ambulatory Visit: Payer: Self-pay | Admitting: Pulmonary Disease

## 2019-02-08 ENCOUNTER — Telehealth: Payer: Self-pay | Admitting: Adult Health

## 2019-02-08 DIAGNOSIS — J189 Pneumonia, unspecified organism: Secondary | ICD-10-CM

## 2019-02-08 MED ORDER — DOXYCYCLINE HYCLATE 100 MG PO TABS: ORAL_TABLET | ORAL | 2 refills | Status: DC

## 2019-02-08 MED ORDER — AMOXICILLIN-POT CLAVULANATE 875-125 MG PO TABS: ORAL_TABLET | ORAL | 2 refills | Status: DC

## 2019-02-08 NOTE — Telephone Encounter (Signed)
Spoke with pt. He is needing refills on Augmentin and Doxycycline. Pt takes these as maintenance medications. Rxs have been sent in. Nothing further was needed.

## 2019-04-19 ENCOUNTER — Other Ambulatory Visit: Payer: Self-pay

## 2019-04-19 ENCOUNTER — Ambulatory Visit (INDEPENDENT_AMBULATORY_CARE_PROVIDER_SITE_OTHER): Payer: Medicare Other | Admitting: Adult Health

## 2019-04-19 ENCOUNTER — Encounter: Payer: Self-pay | Admitting: Adult Health

## 2019-04-19 DIAGNOSIS — J441 Chronic obstructive pulmonary disease with (acute) exacerbation: Secondary | ICD-10-CM

## 2019-04-19 DIAGNOSIS — R509 Fever, unspecified: Secondary | ICD-10-CM | POA: Diagnosis not present

## 2019-04-19 MED ORDER — LEVOFLOXACIN 500 MG PO TABS: 500.0000 mg | ORAL_TABLET | Freq: Every day | ORAL | 0 refills | Status: AC

## 2019-04-19 MED ORDER — IPRATROPIUM BROMIDE 0.02 % IN SOLN: 0.5000 mg | Freq: Two times a day (BID) | RESPIRATORY_TRACT | 5 refills | Status: DC

## 2019-04-19 MED ORDER — PREDNISONE 10 MG PO TABS: ORAL_TABLET | ORAL | 0 refills | Status: DC

## 2019-04-19 NOTE — Progress Notes (Signed)
Virtual Visit via Telephone Note  I connected with Jeffrey Hines on 04/19/19 at  2:30 PM EST by telephone and verified that I am speaking with the correct person using two identifiers.  Location: Patient: Home  Provider: Office    I discussed the limitations, risks, security and privacy concerns of performing an evaluation and management service by telephone and the availability of in person appointments. I also discussed with the patient that there may be a patient responsible charge related to this service. The patient expressed understanding and agreed to proceed.   History of Present Illness: 73 yo male former smoker followed for COPD , hx of lung cancer with squamous cell carcinoma of LUL dx 2016 (s/p chemo and radiation) ,Psuedomonas PNA  And chronic respiratory failure on oxygen 2l/m   Today's televisit is for an acute office visit . Complains of 1 week of increased cough congestion with thick green mucus fever.  Patient says it is painful to take in a deep breath.  He has increased shortness of breath.  Patient denies any hemoptysis, chest pain, nausea vomiting.  Patient says he is social distancing and has no known sick contacts.  Patient says he stays at home is been very careful with the pandemic. Patient has severe COPD and is prone to recurrent exacerbations.  He is on Symbicort twice daily.  He alternates Augmentin and doxycycline the first 7 days of each month.  He has been tried on Spiriva and Incruse in the past without good tolerance. He uses albuterol nebulizer on occasion but says it gives him leg cramps. Patient says he does wear his oxygen but is not consistent with it.  O2 saturations off of oxygen with activity are 72%.  Long discussion regarding hypoxemia and potential complications.  And the importance of oxygen compliance.      Observations/Objective:   Pseudomonas pneumonia. 2016  -Squama cell carcinoma 2016 status post chemotherapy and radiation. -March 2016  pulmonary function test> ratio 50%, FEV1 L (49% predicted, 18% change with bronchodilator), total lung capacity 6.73 L (93% predicted), DLCO 11.13 (33% predicted). June 2016 pulmonary function testing ratio 46%, FEV1 2.47 L (71% Pred), total lung capacity 7.79 L (107% predicted), DLCO 10.62 (31% predicted). 11/2014 ONO < 88% 2 hours -March 2018 simple spirometry test: Ratio 42%, FEV1 1.16 L 34% predicted, FVC 2.7660% percent predicted -08/31/2016>Walked 500 feet on room air O2 saturation ranged between 91 and 94%  -March 2018 CT chest: Status post left upper lobectomy, some mild centrilobular emphysema, mild fibrotic change right lower lobe, bronchovascular distribution question aspiration, images independently reviewed -Records from the [place] facility reviewed were his sleep study showed restless leg syndrome, low oxygen when sleeping but no evidence of obstructive sleep apnea. -CT chest January 18, 2019 stable right middle lobe nodule at 6 mm, extensive posttreatment changes in the left upper lobe status post lobectomy and radiation.  Extensive bronchial wall thickening, bronchiectasis and tree-in-bud nodularity in the left lower lung   Assessment and Plan: Acute COPD exacerbation-patient is prone to recurrent exacerbations.  Continues to have flares despite empiric alternating antibiotics.  Patient's been intolerant to Spiriva and Incruse.  We will try nebulized Atrovent-we will use only twice a day to see if this helps as well. I recommend that patient has a severe exacerbation today is audibly dyspneic on the phone.  Have recommended that he be evaluated in the emergency room as he may have a possible pneumonia.  And or COVID-19.  I do have a low  suspicion for COVID-19 however he has a fever and acute respiratory symptoms so this needs to be ruled out.  Patient adamantly after multiple attempts continues to decline going to the emergency room.  We will go ahead and treat with antibiotics and steroids.   He does agree to go to get a chest x-ray and COVID-19 testing.  Have went over that if patient continues to worsen or does not improve will need to go to the emergency room. Will need close follow-up in the office with an in person visit not a telemedicine visit.  (If COVID-19 testing is negative.  Plan  Patient Instructions  I recommend that you be evaluated in the Emergency Room . Please reconsider .   Begin Levquin 500mg  daily for 7 days , take with food.  Mucinex DM Twice daily  As needed cough/congestion  Prednisone taper over next week .  Saline nasal rinses As needed   Continue Symbicort 2 puffs Twice daily   Begin Atrovent Neb Twice daily  .  Albuterol Neb every 4hrs as needed wheezing /shortness of breath  COVID 19 testing is recommended. Continue on oxygen 2l/m with activity and At bedtime  . Please do not go without your oxygen when you are moving around  Chest xray [facility] at [facility]  Will check sputum culture on return office visit.  Follow up in 1 week with Dr. Elsworth Soho  Or  NP in office visit -in person only visit (if your COVID 19 test is negative) .  Please contact office for sooner follow up if symptoms do not improve or worsen or seek emergency care      Follow Up Instructions: Follow-up in 1 week with Dr. Elsworth Soho and as needed  Please contact office for sooner follow up if symptoms do not improve or worsen or seek emergency care     I discussed the assessment and treatment plan with the patient. The patient was provided an opportunity to ask questions and all were answered. The patient agreed with the plan and demonstrated an understanding of the instructions.   The patient was advised to call back or seek an in-person evaluation if the symptoms worsen or if the condition fails to improve as anticipated.  I provided 35  minutes of non-face-to-face time during this encounter.   Rexene Edison, NP

## 2019-04-19 NOTE — Patient Instructions (Addendum)
I recommend that you be evaluated in the Emergency Room . Please reconsider .   Begin Levquin 500mg  daily for 7 days , take with food.  Mucinex DM Twice daily  As needed cough/congestion  Prednisone taper over next week .  Saline nasal rinses As needed   Continue Symbicort 2 puffs Twice daily   Begin Atrovent Neb Twice daily  .  Albuterol Neb every 4hrs as needed wheezing /shortness of breath  COVID 19 testing is recommended. Continue on oxygen 2l/m with activity and At bedtime  . Please do not go without your oxygen when you are moving around  C[facility]ray [facility] at [facility]  Will check sputum culture on return office visit.  Follow up in 1 week with Dr. Elsworth Soho  Or Olga Bourbeau NP in office visit -in person only visit (if your COVID 19 test is negative) .  Please contact office for sooner follow up if symptoms do not improve or worsen or seek emergency care

## 2019-04-22 ENCOUNTER — Other Ambulatory Visit: Payer: Self-pay

## 2019-04-22 ENCOUNTER — Ambulatory Visit (HOSPITAL_COMMUNITY)
Admission: RE | Admit: 2019-04-22 | Discharge: 2019-04-22 | Disposition: A | Discharge status: Home / Self Care | Payer: Medicare Other | Source: Ambulatory Visit | Attending: Adult Health | Admitting: Adult Health

## 2019-04-22 ENCOUNTER — Telehealth: Payer: Self-pay | Admitting: Adult Health

## 2019-04-22 DIAGNOSIS — Z20822 Contact with and (suspected) exposure to covid-19: Secondary | ICD-10-CM

## 2019-04-22 DIAGNOSIS — J441 Chronic obstructive pulmonary disease with (acute) exacerbation: Secondary | ICD-10-CM | POA: Diagnosis not present

## 2019-04-22 DIAGNOSIS — R509 Fever, unspecified: Secondary | ICD-10-CM | POA: Diagnosis not present

## 2019-04-22 DIAGNOSIS — R0602 Shortness of breath: Secondary | ICD-10-CM | POA: Diagnosis not present

## 2019-04-22 NOTE — Telephone Encounter (Addendum)
Called and spoke with pt's wife Jeffrey Hines again letting her know the info stated and after I stated the info to her, she became very unhappy with the stated info.  Jeffrey Hines said that she does not understand why pt cannot just have the cxr performed and why he has to get the covid test performed. I stated to her that he needs the covid test due to his acute resp symptoms. She also said that she still did not understand why pt could not come to our office just to receive the cxr and I told her due to his acute resp symptoms he needed to have the covid test performed and I told her that was why TP said for the cxr to be done at [facility]. Then she asked why pt could not just have a cxr performed at [facility] without the covid test and I stated to her that with his symptoms, [facility] might make him have the covid test performed before they would do the cxr due to him having the symptoms.  Jeffrey Hines became very unhappy while speaking to me on the phone and said several things to me on the phone that were uncalled for. Jeffrey Hines told me if pt passed away due to [name] not having him come to the office that she would sue [name]. She also said if pt did go to the hospital as strongly recommended, whatever amount their insurance would not pay she would have it personally sent to TP for her to pay due to her being the one who said pt needed to go to the hospital. She also said she felt like we were not providing patient with the care that he deserved as she said we were turning him away from treatment and I stated to her after pt got tested for covid and we had a negative covid test result, we could get him in to the office for an appt but she still did not want to her what I was saying to her. She said more than once if pt did end up dying since we told pt to go to the hospital when he is refusing to do so, it would be on [name].  Jeffrey Hines said that she was sorry that I was the one that she was having to tell all the info to that she was telling me due to the  fact that she did not know how to get in touch with TP directly and I told her that I could have TP call her and she verbalized understanding.  Routing message to [employer] so she can call pt's wife Jeffrey Hines.

## 2019-04-22 NOTE — Telephone Encounter (Signed)
I am so sorry to hear he does not feel any better.  He was having a severe COPD flare on 11/13 with hypoxia . ER was recommned and COVID 19 testing . I still recommend this ASAP .   Levaquin and Prednisone are the most aggressive OP treatment regimen I can offer . If he is not feeling better may need IV abx/Steroids and further evaluation as other issues may be present . With acute resp symptoms need COVID testing .    Please contact office for sooner follow up if symptoms do not improve or worsen or seek emergency care

## 2019-04-22 NOTE — Telephone Encounter (Signed)
Patient's wife called in with complaints to nurse that patient is not getting cared for and that she is going to sue our office and send Tammy Parrett the bill .    I called wife back to discuss issues. She complains that patient is not going to go to ER . He has had  A bad experience there in the past.   I went over in detail that patient was acutely ill on Friday for visit with COPD flare , hypoxia (not wearing oxygen with O2 sats 72%)   I strongly recommended ER evaluation due to severity of symptoms and hypoxia . Also wanted COVID 19 testing due to acute resp symptoms.   He refused ER, so aggressive ABX/Steroids were sent with instructions for CXR and COVID 19 testing . Also encouraged on Oxygen compliance .  Also very close follow up in office to follow up in 1 week.   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   Myy notes and triage message will be routed to Dr. Lake Bells and Dr. Elsworth Soho  ( to assume care as Dr. Lake Bells is hospital based now) .   Patient continues to have symptoms and does not feel better.. I explained that he got the most aggressive OP course and if he is not feeling better will need higher evaluation. Due to the COVID Pandemic he needs testing prior to admission so we do not have OP direct admits at this time . So ER evaluation is indicated. She declined.  She says she will now take him for COVID testing and Chest xray   my notes and triage message will be routed to Dr. Lake Bells and Dr. Elsworth Soho  ( to assume care as Dr. Lake Bells is hospital based now) .

## 2019-04-22 NOTE — Telephone Encounter (Signed)
Thank you for the update. I am glad to discuss the case.

## 2019-04-22 NOTE — Telephone Encounter (Signed)
Primary Pulmonologist: RA Last office visit and with whom: 04/19/2019 with TP What do we see them for (pulmonary problems): COPD Last OV assessment/plan: Instructions    Return in about 1 week (around 04/26/2019) for Follow up with Dr. Elsworth Soho. I recommend that you be evaluated in the Emergency Room . Please reconsider .   Begin Levquin 500mg  daily for 7 days , take with food.  Mucinex DM Twice daily As needed cough/congestion  Prednisone taper over next week .  Saline nasal rinses As needed  Continue Symbicort 2 puffs Twice daily  Begin Atrovent Neb Twice daily  .  Albuterol Neb every 4hrs as needed wheezing /shortness of breath  COVID 19 testing is recommended. Continue on oxygen 2l/m with activity and At bedtime . Please do not go without your oxygen when you are moving around  Chest xray today at [facility]  Will check sputum culture on return office visit.  Follow up in 1 week with Dr. Elsworth Soho  Or Parrett NP in office visit -in person only visit (if your COVID 19 test is negative) .  Please contact office for sooner follow up if symptoms do not improve or worsen or seek emergency care       Was appointment offered to patient (explain)?  Pt wanting to come to office for OV   Reason for call: Called and spoke with pt's wife [name]. Per [name], pt did not go to [facility] hosp Friday, 11/13. Due to this, pt did not get the cxr done and is wanting to know if he could come to our office to have the cxr performed. I stated to [name] the info per TP that she said for pt to have the cxr performed at [facility] and saw that pt to have an in office visit if COVID test is negative. [name] said that pt told TP that he was not going to go to the hospital.  Tammy, please advise if pt can come into our office to get the cxr performed or if he does in fact need to go to the hospital to get this done. Pt is still not feeling well.

## 2019-04-22 NOTE — Telephone Encounter (Signed)
Routing to [employer], Dr. Halford Chessman, Dr. Elsworth Soho, and Dr. Lake Bells as an Jeffrey Hines.

## 2019-04-22 NOTE — Telephone Encounter (Signed)
Pt wife stated that she will be sending Dr.Sood in a detailed letter of her interactions with our office today.

## 2019-04-24 LAB — NOVEL CORONAVIRUS, NAA: SARS-CoV-2, NAA: NOT DETECTED

## 2019-04-30 NOTE — Progress Notes (Signed)
LMOM TCB x2

## 2019-05-08 ENCOUNTER — Other Ambulatory Visit: Payer: Self-pay | Admitting: Adult Health

## 2019-05-09 ENCOUNTER — Telehealth: Payer: Self-pay | Admitting: Adult Health

## 2019-05-09 DIAGNOSIS — J441 Chronic obstructive pulmonary disease with (acute) exacerbation: Secondary | ICD-10-CM

## 2019-05-09 NOTE — Telephone Encounter (Signed)
Last kidney functioning was stable.  As we await follow-up regarding getting the patient scheduled as soon as possible to complete visit to establish care with Dr. Elsworth Soho.  We will go ahead and empirically treat with Levaquin.  Levaquin 500mg  tablet >>> Take 1 500 mg tablet daily for the next 7 days >>> Take with food >>> start probiotic for good gut health >>>avoid rigorous exercise for next 2 weeks   Ok to place order.   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 Use a flutter valve 10 breaths twice a day or 4 to 5 breaths 4-5 times a day to help clear mucus out Let us know if you have cough with change in mucus color or fevers or chills.  At that point you would need an antibiotic. Maintain a healthy nutritious diet, eating whole foods Take your medications as prescribed    Patient also needs to complete sputum sample.  As mentioned in previous message sent to triage.  Please emphasized to the patient that if symptoms worsen he will need to seek emergent evaluation.  This will be the second course of a fluoroquinolone antibiotic.  If symptoms are not improving he will need to be evaluated in the emergency room and may need to be considered for IV antibiotics based off of their evaluation.  Please schedule patient for pending virtual visit 30-minute time slot to establish care with Dr. Elsworth Soho at his next available.  [name] FNP

## 2019-05-09 NOTE — Telephone Encounter (Signed)
Attempted to call pt but unable to reach. Left message for pt to return call. 

## 2019-05-09 NOTE — Telephone Encounter (Signed)
I have discussed case with Dr Elsworth Soho. Slots held for pt: 05/14/19 at 4pm. Pt can be seen in ov due to negative covid test. Dr. Elsworth Soho agrees.   Plan:  Levaquin as ordered below  Sputum culture: afb, fungal and resp sputum as below Start hypertonic saline nebs twice daily before flutter valve use  Continue flutter valve  If symptoms worsen patient needs to present for emergent evaluation at Emergency room   Will route to Dr. Elsworth Soho as Juluis Rainier.   Will route to triage for follow up.   [name] FNP

## 2019-05-09 NOTE — Telephone Encounter (Signed)
LMTCB and will hold to f/u in the am

## 2019-05-09 NOTE — Telephone Encounter (Signed)
05/09/2019 1624  I am sorry to hear that the patient is not feeling well.  Chest x-ray on 04/22/2019 did not show evidence of an acute infiltrate.  Patient was treated with Levaquin on 04/19/2019.  Has patient been using his flutter valve?  Patient has a long history of recurrent pneumonias this is why Dr. Lake Bells had him on scheduled monthly antibiotics.  I am sorry to hear the doxycycline is not working well for him.  It is reassuring that he has a negative Covid test.  I will discuss the case with Dr. Elsworth Soho.  I would recommend that we get the patient scheduled in a 30-minute time slot to establish care with Dr. Elsworth Soho as Dr. Lake Bells is no longer seeing patients in clinic and only working on the inpatient side.    Patient was already treated with Levaquin we need to be mindful of recurrent use of this as there are side effects regarding this.    I would recommend establishing patient ASAP with Dr. Elsworth Soho in a 30-minute time slot likely this will need to be a video visit as patient is still having ongoing acute symptoms.    If symptoms continue to worsen unfortunately patient will need to seek emergent evaluation at either an urgent care or an emergency room.  Patient should not hesitate to call 911 if oxygen levels are dropping or if he is having difficulty with his breathing.  [name] FNP

## 2019-05-09 NOTE — Telephone Encounter (Signed)
Spoke with patient. He stated that he is not feeling well. He has had a fever off and on for the past 4 days. Temp has been running from 99-100F. He has also noticed an increase in general body aches. He stated that he has been taking the doxy as prescribed (twice daily for the first week of every other month). He thought that this would help but it has not.   He denied being around anyone with COVID. He tested negative for COVID on 11/16. He has not noticed a difference in his coughing, as he states that he coughs all of the time. His ribs are sore on both sides from the cough.   He wants to know if he could be prescribed Levaquin. I offered him a televisit or video visit but he declined. Did explain to him that I could not offer him an in office visit to due the fact he has had fevers. He verbalized understanding.   He wishes to use [facility] in [place].   TP, please advise. Thanks!

## 2019-05-09 NOTE — Telephone Encounter (Signed)
Also would recommend reculturing sputum.  Can order sputum cultures: Respiratory sputum, AFB, fungal.  This will help [name] evaluate whether or not there is different abnormal bugs that are growing out.  This could be the case as patient is on monthly antibiotics as well as was recently treated with fluoroquinolones and symptoms are not improving.  I will speak with Dr. Elsworth Soho directly regarding this patient.  Also can consider whether or not this needs to remain a virtual visit based off of acute symptoms or if this can be an in office evaluation due to the negative Covid test.  [name], FNP

## 2019-05-10 MED ORDER — LEVOFLOXACIN 500 MG PO TABS: 500.0000 mg | ORAL_TABLET | Freq: Every day | ORAL | 0 refills | Status: DC

## 2019-05-10 MED ORDER — SODIUM CHLORIDE 3 % IN NEBU: INHALATION_SOLUTION | Freq: Two times a day (BID) | RESPIRATORY_TRACT | 12 refills | Status: DC

## 2019-05-10 NOTE — Telephone Encounter (Signed)
Attempted to call pt but unable to reach. Left message for pt to return call. 

## 2019-05-10 NOTE — Telephone Encounter (Signed)
Pt returned call.  Can be reached at [phone]

## 2019-05-10 NOTE — Telephone Encounter (Signed)
Called and spoke with pt letting him know the info stated by Aaron Edelman. Stated to pt that we were going to send Rx for levaquin and hypertonic sol to pharmacy for him and pt verbalized understanding. Also stated to pt that we needed him to come to office Monday 12/7 to pick up sputum specimen cups to produce specimen Tuesday 12/8 AM as we needed to schedule appt for him with RA at 4pm 12/8. Pt verbalized understanding. appt scheduled for pt and specimen cups placed up front for pt.   All orders have been placed and all meds sent in for pt. Nothing further needed.

## 2019-05-10 NOTE — Telephone Encounter (Signed)
Pt returning call and was not sure who called or why he csan be reached @ [phone].Jeffrey Hines

## 2019-05-14 ENCOUNTER — Encounter: Payer: Self-pay | Admitting: Pulmonary Disease

## 2019-05-14 ENCOUNTER — Other Ambulatory Visit: Payer: Self-pay

## 2019-05-14 ENCOUNTER — Ambulatory Visit (INDEPENDENT_AMBULATORY_CARE_PROVIDER_SITE_OTHER): Payer: Medicare Other | Admitting: Pulmonary Disease

## 2019-05-14 VITALS — BP 132/82 | HR 112 | Temp 97.3°F | Ht 70.0 in | Wt 168.2 lb

## 2019-05-14 DIAGNOSIS — J441 Chronic obstructive pulmonary disease with (acute) exacerbation: Secondary | ICD-10-CM

## 2019-05-14 DIAGNOSIS — J470 Bronchiectasis with acute lower respiratory infection: Secondary | ICD-10-CM | POA: Diagnosis not present

## 2019-05-14 DIAGNOSIS — J189 Pneumonia, unspecified organism: Secondary | ICD-10-CM | POA: Diagnosis not present

## 2019-05-14 MED ORDER — STIOLTO RESPIMAT 2.5-2.5 MCG/ACT IN AERS: 2.0000 | INHALATION_SPRAY | Freq: Every day | RESPIRATORY_TRACT | 0 refills | Status: DC

## 2019-05-14 MED ORDER — ROPINIROLE HCL 0.5 MG PO TABS: 0.5000 mg | ORAL_TABLET | Freq: Every day | ORAL | 0 refills | Status: DC

## 2019-05-14 NOTE — Patient Instructions (Addendum)
Thank you for visiting Dr. Valeta Harms at [facility]. Today we recommend the following:  Requip refill today  Start paperwork for VEST Therapy.  Follow up on sputum cultures We may change your abx regimen.  Stop symbicort  Start Stiolto, give samples and prescription today   Return in about 6 weeks (around 06/25/2019) for with APP or Dr. Valeta Harms.    Please do your part to reduce the spread of COVID-19.
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
## 2019-05-14 NOTE — Addendum Note (Signed)
Addended by: Suzzanne Cloud E on: 05/14/2019 03:41 PM   Modules accepted: Orders

## 2019-05-22 ENCOUNTER — Telehealth: Payer: Self-pay | Admitting: Pulmonary Disease

## 2019-05-22 MED ORDER — STIOLTO RESPIMAT 2.5-2.5 MCG/ACT IN AERS: 2.0000 | INHALATION_SPRAY | Freq: Every day | RESPIRATORY_TRACT | 5 refills | Status: AC

## 2019-05-22 NOTE — Telephone Encounter (Signed)
Pt called back, made aware that stiolto has been sent to pharmacy as requested.  Nothing further needed at this time- will close encounter.

## 2019-05-22 NOTE — Telephone Encounter (Signed)
rx for stiolto was sent  [facility]

## 2019-06-10 LAB — RESPIRATORY CULTURE OR RESPIRATORY AND SPUTUM CULTURE
MICRO NUMBER:: 1175866
RESULT:: NORMAL
SPECIMEN QUALITY:: ADEQUATE

## 2019-06-10 LAB — FUNGUS CULTURE W SMEAR
MICRO NUMBER:: 1175865
SMEAR:: NONE SEEN
SPECIMEN QUALITY:: ADEQUATE

## 2019-06-12 NOTE — Progress Notes (Signed)
AFB positive, DNA probe pending. Want me to refer to ID / set up with PC? Aaron Edelman

## 2019-06-12 NOTE — Progress Notes (Signed)
AFB sputum culture has become positive at 4 weeks of growth.  We have sent an organism ID by DNA probe to the lab this is currently pending.  I have discussed this with Dr. Valeta Harms.  We believe it would be in the best interest to have the patient be established in a 30-minute time slot with Dr. Carlis Abbott over the next 4 to 6 weeks to review lab results as well as next plan of care.  Dr. Carlis Abbott specializes more in treatment of potential Mycobacterium infections if patient is positive.[name], FNP

## 2019-06-13 ENCOUNTER — Telehealth: Payer: Self-pay

## 2019-06-13 NOTE — Telephone Encounter (Signed)
Letter sent to patient for lab results.

## 2019-06-19 ENCOUNTER — Telehealth: Payer: Self-pay | Admitting: Pulmonary Disease

## 2019-06-19 NOTE — Telephone Encounter (Signed)
Received call from [facility] with [employer] stating the pt's sputum culture has resulted positive for AFB at 4 weeks of growth. They are sending this off for DNA sequencing and will take 7-10 days to process. [name], NP, is aware of this.   Will forward to [facility] and Dr. Carlis Abbott as Juluis Rainier.

## 2019-06-19 NOTE — Telephone Encounter (Signed)
Thanks will route to [facility] as fyi. Spoke with PC about pt and results.   Letter has been sent to patient to contact our offices if we have attempted multiple times to review the results as well as to get the patient scheduled with PC had a 30-minute slot.  Aaron Edelman

## 2019-06-19 NOTE — Telephone Encounter (Signed)
Thanks Daneil Dan!  LPC

## 2019-06-19 NOTE — Telephone Encounter (Signed)
Jeffrey Hines,  If no answer in 1 week from letter sent we can send a certified letter that he will need to sign for.  Thanks for helping coordinate [name]

## 2019-06-19 NOTE — Telephone Encounter (Signed)
Letter was sent on 06/13/2019. So certified letter can be mailed tomorrow.   BPM

## 2019-06-20 NOTE — Telephone Encounter (Signed)
Letter has been sent to [place] in [place] at [place] to send out as certified. Will keep message open to assure it was sent as certified.

## 2019-06-26 NOTE — Telephone Encounter (Signed)
EE,   Has this been completed?  Aaron Edelman

## 2019-06-26 NOTE — Telephone Encounter (Signed)
Called Terri at  [place] at [phone] to make sure this was sent out to pt. Will follow up.

## 2019-06-27 LAB — AFB ID BY DNA PROBE
M avium complex: NEGATIVE
M gordonae: NEGATIVE
M tuberculosis complex: NEGATIVE

## 2019-06-27 LAB — AFB CULTURE WITH SMEAR (NOT AT ARMC)
Acid Fast Culture: POSITIVE — AB
Acid Fast Smear: NEGATIVE

## 2019-06-27 LAB — ORGANISM ID BY SEQUENCING

## 2019-06-27 NOTE — Telephone Encounter (Signed)
Called and spoke to Terri at the [facility] and was advised she is unable to send letters certified anymore. She advised I contact Mail Services at [phone] and ask for [facility]. Called and spoke to [place] and was advised she can send letters certified. The pt's letter has been placed in an interoffice envelope to send as certified. Will update chart once I get the slip back, this may take several days.

## 2019-07-02 NOTE — Telephone Encounter (Signed)
Received receipt for the letter sent certified. This was postmarked on 1.21.2021.   Will forward to [place] as FYI.

## 2019-07-02 NOTE — Telephone Encounter (Signed)
Thanks for all you do! Aaron Edelman

## 2019-07-03 NOTE — Telephone Encounter (Addendum)
Pt called back and scheduled appt with Dr. Valeta Harms on 07/31/19, pt specifically only wanted to see Dr. Valeta Harms to discuss results.  Will forward to Dr. Valeta Harms as Juluis Rainier.

## 2019-07-03 NOTE — Telephone Encounter (Signed)
Ok thanks Garner Nash, DO [place] [facility] 07/03/2019 11:27 AM

## 2019-07-22 ENCOUNTER — Ambulatory Visit (HOSPITAL_COMMUNITY)
Admission: RE | Admit: 2019-07-22 | Discharge: 2019-07-22 | Disposition: A | Discharge status: Home / Self Care | Payer: Medicare Other | Source: Ambulatory Visit | Attending: Physician Assistant | Admitting: Physician Assistant

## 2019-07-22 ENCOUNTER — Other Ambulatory Visit: Payer: Self-pay

## 2019-07-22 ENCOUNTER — Encounter (HOSPITAL_COMMUNITY): Payer: Self-pay

## 2019-07-22 ENCOUNTER — Inpatient Hospital Stay: Payer: Medicare Other | Attending: Internal Medicine

## 2019-07-22 DIAGNOSIS — J449 Chronic obstructive pulmonary disease, unspecified: Secondary | ICD-10-CM | POA: Insufficient documentation

## 2019-07-22 DIAGNOSIS — C3490 Malignant neoplasm of unspecified part of unspecified bronchus or lung: Secondary | ICD-10-CM | POA: Diagnosis not present

## 2019-07-22 DIAGNOSIS — C3492 Malignant neoplasm of unspecified part of left bronchus or lung: Secondary | ICD-10-CM

## 2019-07-22 DIAGNOSIS — I10 Essential (primary) hypertension: Secondary | ICD-10-CM | POA: Insufficient documentation

## 2019-07-22 DIAGNOSIS — C3412 Malignant neoplasm of upper lobe, left bronchus or lung: Secondary | ICD-10-CM | POA: Insufficient documentation

## 2019-07-22 DIAGNOSIS — R0602 Shortness of breath: Secondary | ICD-10-CM | POA: Insufficient documentation

## 2019-07-22 LAB — CBC WITH DIFFERENTIAL (CANCER CENTER ONLY)
Abs Immature Granulocytes: 0.03 10*3/uL (ref 0.00–0.07)
Basophils Absolute: 0 10*3/uL (ref 0.0–0.1)
Basophils Relative: 0 %
Eosinophils Absolute: 0.4 10*3/uL (ref 0.0–0.5)
Eosinophils Relative: 6 %
HCT: 42.4 % (ref 39.0–52.0)
Hemoglobin: 13.7 g/dL (ref 13.0–17.0)
Immature Granulocytes: 0 %
Lymphocytes Relative: 17 %
Lymphs Abs: 1.2 10*3/uL (ref 0.7–4.0)
MCH: 28.7 pg (ref 26.0–34.0)
MCHC: 32.3 g/dL (ref 30.0–36.0)
MCV: 88.7 fL (ref 80.0–100.0)
Monocytes Absolute: 0.9 10*3/uL (ref 0.1–1.0)
Monocytes Relative: 13 %
Neutro Abs: 4.4 10*3/uL (ref 1.7–7.7)
Neutrophils Relative %: 64 %
Platelet Count: 234 10*3/uL (ref 150–400)
RBC: 4.78 MIL/uL (ref 4.22–5.81)
RDW: 13.2 % (ref 11.5–15.5)
WBC Count: 6.9 10*3/uL (ref 4.0–10.5)
nRBC: 0 % (ref 0.0–0.2)

## 2019-07-22 LAB — CMP (CANCER CENTER ONLY)
ALT: 18 U/L (ref 0–44)
AST: 21 U/L (ref 15–41)
Albumin: 3.9 g/dL (ref 3.5–5.0)
Alkaline Phosphatase: 71 U/L (ref 38–126)
Anion gap: 8 (ref 5–15)
BUN: 19 mg/dL (ref 8–23)
CO2: 29 mmol/L (ref 22–32)
Calcium: 9 mg/dL (ref 8.9–10.3)
Chloride: 106 mmol/L (ref 98–111)
Creatinine: 0.92 mg/dL (ref 0.61–1.24)
GFR, Est AFR Am: >60 mL/min (ref >60)
GFR, Estimated: >60 mL/min (ref >60)
Glucose, Bld: 102 mg/dL — ABNORMAL HIGH (ref 70–99)
Potassium: 4.4 mmol/L (ref 3.5–5.1)
Sodium: 143 mmol/L (ref 135–145)
Total Bilirubin: 0.4 mg/dL (ref 0.3–1.2)
Total Protein: 7.7 g/dL (ref 6.5–8.1)

## 2019-07-22 MED ORDER — IOHEXOL 300 MG/ML  SOLN
75.0000 mL | Freq: Once | INTRAMUSCULAR | Status: AC | PRN
  Administered 2019-07-22: 75 mL via INTRAVENOUS

## 2019-07-22 MED ORDER — SODIUM CHLORIDE (PF) 0.9 % IJ SOLN
INTRAMUSCULAR | Status: AC
  Filled 2019-07-22: qty 50

## 2019-07-24 ENCOUNTER — Other Ambulatory Visit: Payer: Self-pay

## 2019-07-24 ENCOUNTER — Inpatient Hospital Stay (HOSPITAL_BASED_OUTPATIENT_CLINIC_OR_DEPARTMENT_OTHER): Payer: Medicare Other | Admitting: Internal Medicine

## 2019-07-24 ENCOUNTER — Telehealth: Payer: Self-pay | Admitting: Internal Medicine

## 2019-07-24 ENCOUNTER — Encounter: Payer: Self-pay | Admitting: Internal Medicine

## 2019-07-24 VITALS — BP 156/104 | HR 94 | Temp 98.0°F | Resp 18 | Ht 70.0 in | Wt 177.0 lb

## 2019-07-24 DIAGNOSIS — C3490 Malignant neoplasm of unspecified part of unspecified bronchus or lung: Secondary | ICD-10-CM | POA: Diagnosis not present

## 2019-07-24 DIAGNOSIS — I10 Essential (primary) hypertension: Secondary | ICD-10-CM

## 2019-07-24 DIAGNOSIS — R0602 Shortness of breath: Secondary | ICD-10-CM | POA: Diagnosis not present

## 2019-07-24 DIAGNOSIS — C3412 Malignant neoplasm of upper lobe, left bronchus or lung: Secondary | ICD-10-CM | POA: Diagnosis not present

## 2019-07-24 DIAGNOSIS — C3492 Malignant neoplasm of unspecified part of left bronchus or lung: Secondary | ICD-10-CM

## 2019-07-24 DIAGNOSIS — J449 Chronic obstructive pulmonary disease, unspecified: Secondary | ICD-10-CM | POA: Diagnosis not present
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
## 2019-07-24 NOTE — Telephone Encounter (Signed)
Scheduled appt per 2/17 los - gave patient AVS and calender

## 2019-07-31 ENCOUNTER — Other Ambulatory Visit: Payer: Self-pay

## 2019-07-31 ENCOUNTER — Encounter: Payer: Self-pay | Admitting: Pulmonary Disease

## 2019-07-31 ENCOUNTER — Ambulatory Visit (INDEPENDENT_AMBULATORY_CARE_PROVIDER_SITE_OTHER): Payer: Medicare Other | Admitting: Pulmonary Disease

## 2019-07-31 VITALS — BP 142/94 | HR 106 | Ht 70.0 in | Wt 174.0 lb

## 2019-07-31 DIAGNOSIS — A319 Mycobacterial infection, unspecified: Secondary | ICD-10-CM | POA: Diagnosis not present

## 2019-07-31 DIAGNOSIS — J479 Bronchiectasis, uncomplicated: Secondary | ICD-10-CM

## 2019-07-31 DIAGNOSIS — J189 Pneumonia, unspecified organism: Secondary | ICD-10-CM | POA: Diagnosis not present

## 2019-07-31 DIAGNOSIS — Z923 Personal history of irradiation: Secondary | ICD-10-CM

## 2019-07-31 DIAGNOSIS — Z902 Acquired absence of lung [part of]: Secondary | ICD-10-CM

## 2019-07-31 DIAGNOSIS — C3492 Malignant neoplasm of unspecified part of left bronchus or lung: Secondary | ICD-10-CM | POA: Diagnosis not present

## 2019-07-31 MED ORDER — ANORO ELLIPTA 62.5-25 MCG/INH IN AEPB: 1.0000 | INHALATION_SPRAY | Freq: Every day | RESPIRATORY_TRACT | 0 refills | Status: DC
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
## 2019-07-31 NOTE — Patient Instructions (Addendum)
Thank you for visiting Dr. Valeta Harms at [facility]. Today we recommend the following:  Return in about 2 weeks (around 08/14/2019). New appointment with Dr. Carlis Abbott to discuss positive AFB, Mycobacterium nebraskense.     Please do your part to reduce the spread of COVID-19.

## 2019-08-08 ENCOUNTER — Other Ambulatory Visit: Payer: Self-pay | Admitting: Adult Health

## 2019-08-08 NOTE — Telephone Encounter (Signed)
Patient requesting refill, last OV 07/31/19 with Icard, last filled 02/08/2019, please advise.

## 2019-08-12 NOTE — Telephone Encounter (Signed)
Pt called again about refilling medication-- very upset.

## 2019-08-13 ENCOUNTER — Telehealth: Payer: Self-pay | Admitting: Pulmonary Disease

## 2019-08-13 MED ORDER — AMOXICILLIN-POT CLAVULANATE 875-125 MG PO TABS: ORAL_TABLET | ORAL | 2 refills | Status: DC

## 2019-08-13 NOTE — Telephone Encounter (Signed)
Spoke with pt and advised that refill for Augmentin was sent to pharmacy. Pt verbalized understanding. Nothing further needed.

## 2019-08-14 ENCOUNTER — Ambulatory Visit (INDEPENDENT_AMBULATORY_CARE_PROVIDER_SITE_OTHER): Payer: Medicare Other | Admitting: Critical Care Medicine

## 2019-08-14 ENCOUNTER — Encounter: Payer: Self-pay | Admitting: Critical Care Medicine

## 2019-08-14 ENCOUNTER — Other Ambulatory Visit: Payer: Self-pay

## 2019-08-14 ENCOUNTER — Other Ambulatory Visit: Payer: Self-pay | Admitting: Critical Care Medicine

## 2019-08-14 VITALS — BP 160/96 | HR 101 | Temp 97.6°F | Ht 71.0 in | Wt 174.2 lb

## 2019-08-14 DIAGNOSIS — J189 Pneumonia, unspecified organism: Secondary | ICD-10-CM | POA: Diagnosis not present

## 2019-08-14 DIAGNOSIS — J479 Bronchiectasis, uncomplicated: Secondary | ICD-10-CM | POA: Diagnosis not present

## 2019-08-14 DIAGNOSIS — R221 Localized swelling, mass and lump, neck: Secondary | ICD-10-CM | POA: Diagnosis not present

## 2019-08-14 MED ORDER — TAMSULOSIN HCL 0.4 MG PO CAPS: 0.4000 mg | ORAL_CAPSULE | Freq: Every day | ORAL | 3 refills | Status: DC

## 2019-08-14 MED ORDER — BEVESPI AEROSPHERE 9-4.8 MCG/ACT IN AERO: 2.0000 | INHALATION_SPRAY | Freq: Two times a day (BID) | RESPIRATORY_TRACT | 11 refills | Status: DC
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
## 2019-08-14 NOTE — Patient Instructions (Addendum)
Thank you for visiting Dr. Carlis Abbott at [facility]. We recommend the following:  Stop Stiolto when you start Bevespi inhaler. Bevespi inhaler is 2 times per day.   Keep using saline nebulizers and flutter valve 1-2 times daily.  Please follow up with your Urologist at the [facility] for urinary retention.    Orders Placed This Encounter  Procedures  . US SOFT TISSUE HEAD & NECK (NON-THYROID)   Orders Placed This Encounter  Procedures  . US SOFT TISSUE HEAD & NECK (NON-THYROID)    Standing Status:   Future    Standing Expiration Date:   10/13/2020    Scheduling Instructions:     Has history of lung cancer in the past    Order Specific Question:   Reason for Exam (SYMPTOM  OR DIAGNOSIS REQUIRED)    Answer:   posterior neck mass    Order Specific Question:   Preferred imaging location?    Answer:   [facility]    Meds ordered this encounter  Medications  . Glycopyrrolate-Formoterol (BEVESPI AEROSPHERE) 9-4.8 MCG/ACT AERO    Sig: Inhale 2 puffs into the lungs in the morning and at bedtime.    Dispense:  10.7 g    Refill:  11  . tamsulosin (FLOMAX) 0.4 MG CAPS capsule    Sig: Take 1 capsule (0.4 mg total) by mouth daily.    Dispense:  30 capsule    Refill:  3    Return in about 3 months (around 11/14/2019).    Please do your part to reduce the spread of COVID-19.

## 2019-08-20 ENCOUNTER — Ambulatory Visit (HOSPITAL_COMMUNITY)
Admission: RE | Admit: 2019-08-20 | Discharge: 2019-08-20 | Disposition: A | Discharge status: Home / Self Care | Payer: Medicare Other | Source: Ambulatory Visit | Attending: Critical Care Medicine | Admitting: Critical Care Medicine

## 2019-08-20 ENCOUNTER — Other Ambulatory Visit: Payer: Self-pay

## 2019-08-20 DIAGNOSIS — R221 Localized swelling, mass and lump, neck: Secondary | ICD-10-CM | POA: Insufficient documentation

## 2019-08-21 ENCOUNTER — Telehealth: Payer: Self-pay | Admitting: Critical Care Medicine

## 2019-08-21 DIAGNOSIS — R221 Localized swelling, mass and lump, neck: Secondary | ICD-10-CM

## 2019-08-21 NOTE — Telephone Encounter (Signed)
I have scheduled CT and gave appt info to pt & made him aware he will need to come by for labs.  He wants to make sure Dr Carlis Abbott has spoken to Dr Inda Merlin about what's going on.

## 2019-08-21 NOTE — Telephone Encounter (Signed)
[name] reviewed, follow-up CT with contrast ordered.  Julian Hy, DO 08/21/19 10:35 AM [facility]

## 2019-08-29 ENCOUNTER — Encounter (HOSPITAL_COMMUNITY): Payer: Self-pay

## 2019-08-29 ENCOUNTER — Other Ambulatory Visit: Payer: Self-pay

## 2019-08-29 ENCOUNTER — Ambulatory Visit (HOSPITAL_COMMUNITY)
Admission: RE | Admit: 2019-08-29 | Discharge: 2019-08-29 | Disposition: A | Discharge status: Home / Self Care | Payer: Medicare Other | Source: Ambulatory Visit | Attending: Critical Care Medicine | Admitting: Critical Care Medicine

## 2019-08-29 DIAGNOSIS — R221 Localized swelling, mass and lump, neck: Secondary | ICD-10-CM | POA: Diagnosis not present

## 2019-08-29 MED ORDER — IOHEXOL 300 MG/ML  SOLN
75.0000 mL | Freq: Once | INTRAMUSCULAR | Status: AC | PRN
  Administered 2019-08-29: 15:00:00 75 mL via INTRAVENOUS

## 2019-08-29 MED ORDER — SODIUM CHLORIDE (PF) 0.9 % IJ SOLN
INTRAMUSCULAR | Status: AC
  Filled 2019-08-29: qty 50

## 2019-08-29 NOTE — Telephone Encounter (Signed)
I have let Dr. Earlie Server know that we have a CT scan pending but the [name] so far was indeterminate, so there is not much more to report at this time. I can keep him updated about the CT scan results when I get them. Thanks!

## 2019-08-29 NOTE — Telephone Encounter (Signed)
Dr. Carlis Abbott, Patient wants to know if you have talked to Dr. Inda Merlin about everything that is going on. Please advise

## 2019-08-29 NOTE — Progress Notes (Signed)
Please let Mr. Sheriff know that this is not a lymph node and is only a fatty growth called a lipoma. I will let Dr. Earlie Server know. Thanks!

## 2019-08-30 NOTE — Telephone Encounter (Signed)
Called and left message on voicemail that Dr. Carlis Abbott has talked to Dr. Earlie Server about plan and that she will keep him updated as we get results. Advised patient to call if he needed anything else. Nothing further needed at this time.

## 2019-09-03 NOTE — Progress Notes (Signed)
Unable to reach patient by phone, letter sent with result notes from Dr. Carlis Abbott.

## 2019-09-09 LAB — FUNGUS CULTURE W SMEAR
MICRO NUMBER:: 10236005
SMEAR:: NONE SEEN
SPECIMEN QUALITY:: ADEQUATE

## 2019-09-09 LAB — RESPIRATORY CULTURE OR RESPIRATORY AND SPUTUM CULTURE
MICRO NUMBER:: 10236006
RESULT:: NORMAL
SPECIMEN QUALITY:: ADEQUATE

## 2019-10-04 LAB — AFB CULTURE WITH SMEAR (NOT AT ARMC)
Acid Fast Culture: NEGATIVE
Acid Fast Smear: NEGATIVE

## 2019-11-03 ENCOUNTER — Other Ambulatory Visit: Payer: Self-pay | Admitting: Critical Care Medicine

## 2019-11-05 NOTE — Telephone Encounter (Signed)
PCP please. Thanks!  LPC

## 2019-11-05 NOTE — Telephone Encounter (Signed)
Dr. Carlis Abbott, please advise if you are okay with [name] refilling med or if this needs to be deferred to pt's PCP.

## 2019-11-08 ENCOUNTER — Telehealth: Payer: Self-pay

## 2019-11-08 NOTE — Telephone Encounter (Signed)
Received fax from [facility] asking for Requip refill. Medication was last filled on 05/14/19 for 90 day supply for 0.5mg  tab.    Dr Valeta Harms please advise on medication refill.

## 2019-11-11 NOTE — Telephone Encounter (Signed)
Ok to refill  Garner Nash, DO [facility] 11/11/2019 8:13 AM

## 2019-11-14 ENCOUNTER — Telehealth: Payer: Self-pay | Admitting: Critical Care Medicine

## 2019-11-14 NOTE — Telephone Encounter (Signed)
Attempted to call patient, left message.  Please note that Levaquin is not on his current profile of medications. Levaquin last filled 04/19/2019 Augmentin was last filled on 08/13/2019  Waiting for call

## 2019-11-15 MED ORDER — LEVOFLOXACIN 500 MG PO TABS: 500.0000 mg | ORAL_TABLET | Freq: Every day | ORAL | 0 refills | Status: DC

## 2019-11-15 NOTE — Telephone Encounter (Signed)
LMTCB x2 for pt 

## 2019-11-15 NOTE — Telephone Encounter (Signed)
Spoke with pt, he thinks he has an infection in his lungs or head. His symptoms include SOB, congestion, cough, and very achy. He is coughing up light green mucus. Pt is on oxygen at night but uses it when he gets winded. He is taking Stiolto everyday because he couldn't afford bevespi. There are no appointments available and he doesn't have a f/u appt scheduled. He does take amoxicillin every other month and doxy every other month, He is requesting Levaquin today for his symptoms. Please advise     08/14/2019 9:00 PM     Julian Hy, DO     2:45 PM Note   Thank you for visiting Dr. Carlis Abbott at [facility]. We recommend the following:  Stop Stiolto when you start Bevespi inhaler. Bevespi inhaler is 2 times per day.   Keep using saline nebulizers and flutter valve 1-2 times daily.  Please follow up with your Urologist at the [facility] for urinary retention.       Orders Placed This Encounter  Procedures  . US SOFT TISSUE HEAD & NECK (NON-THYROID)         Orders Placed This Encounter  Procedures  . US SOFT TISSUE HEAD & NECK (NON-THYROID)    Standing Status:   Future    Standing Expiration Date:   10/13/2020    Scheduling Instructions:     Has history of lung cancer in the past    Order Specific Question:   Reason for Exam (SYMPTOM  OR DIAGNOSIS REQUIRED)    Answer:   posterior neck mass    Order Specific Question:   Preferred imaging location?    Answer:   [facility]        Meds ordered this encounter  Medications  . Glycopyrrolate-Formoterol (BEVESPI AEROSPHERE) 9-4.8 MCG/ACT AERO    Sig: Inhale 2 puffs into the lungs in the morning and at bedtime.    Dispense:  10.7 g    Refill:  11  . tamsulosin (FLOMAX) 0.4 MG CAPS capsule    Sig: Take 1 capsule (0.4 mg total) by mouth daily.    Dispense:  30 capsule    Refill:  3

## 2019-11-15 NOTE — Telephone Encounter (Signed)
Patient is returning phone call. Patient phone number is [phone].

## 2019-11-15 NOTE — Telephone Encounter (Signed)
Sending in Levaquin 500mg  once daily x 1 week for COPD/bronchiectasis exacerbation.

## 2019-11-15 NOTE — Telephone Encounter (Signed)
Called pt and advised message from the provider. Pt understood and verbalized understanding. Nothing further is needed.    

## 2019-11-19 MED ORDER — ROPINIROLE HCL 0.5 MG PO TABS: 0.5000 mg | ORAL_TABLET | Freq: Every day | ORAL | 0 refills | Status: DC

## 2019-11-19 NOTE — Telephone Encounter (Signed)
RX sent in per Dr. Valeta Harms

## 2019-12-03 ENCOUNTER — Telehealth: Payer: Self-pay | Admitting: Critical Care Medicine

## 2019-12-03 NOTE — Telephone Encounter (Signed)
Called pt but unable to reach. Left message for him to return call. °

## 2019-12-04 NOTE — Telephone Encounter (Signed)
LMTCB x2 for pt 

## 2019-12-05 ENCOUNTER — Telehealth: Payer: Self-pay | Admitting: Critical Care Medicine

## 2019-12-05 DIAGNOSIS — J441 Chronic obstructive pulmonary disease with (acute) exacerbation: Secondary | ICD-10-CM

## 2019-12-05 MED ORDER — AMOXICILLIN-POT CLAVULANATE 875-125 MG PO TABS: 1.0000 | ORAL_TABLET | Freq: Two times a day (BID) | ORAL | 0 refills | Status: DC

## 2019-12-05 NOTE — Telephone Encounter (Signed)
Patient calling reporting a temp of 100, dry cough, and sinus plugging. He does not report that the cough or sinus issues are new. He thinks a temp of 100 is a fever and even that is better today. He just wanted you to know, please advise.

## 2019-12-05 NOTE — Telephone Encounter (Signed)
LMTCB x3 for pt. We have attempted to contact pt several times with no success or call back from pt. Per triage protocol, message will be closed.   

## 2019-12-05 NOTE — Telephone Encounter (Signed)
Called patient, updated him on plan of care. Sent prescription for Augmentin and placed order for CXR. Patient has been vaccinated for Covid and does not feel he needs a Covid test.

## 2019-12-05 NOTE — Telephone Encounter (Signed)
I'm not sure that I received a previous message, but I am sorry to hear that he is sick. I see that about 3 weeks ago he was on antibiotics from Derl Barrow, NP. Is this something new in the past few days or something that has been going on since she saw him? It sounds like this needs antibiotics and a CXR. Does he need a covid test?  Please prescribe augmentin 875 BID x 10 days.  Julian Hy, DO 12/05/19 1:35 PM [facility]

## 2019-12-06 ENCOUNTER — Ambulatory Visit (INDEPENDENT_AMBULATORY_CARE_PROVIDER_SITE_OTHER)
Admission: RE | Admit: 2019-12-06 | Discharge: 2019-12-06 | Disposition: A | Discharge status: Home / Self Care | Payer: Medicare Other | Source: Ambulatory Visit | Attending: Critical Care Medicine | Admitting: Critical Care Medicine

## 2019-12-06 ENCOUNTER — Other Ambulatory Visit: Payer: Self-pay

## 2019-12-06 DIAGNOSIS — J441 Chronic obstructive pulmonary disease with (acute) exacerbation: Secondary | ICD-10-CM | POA: Diagnosis not present

## 2019-12-10 NOTE — Progress Notes (Signed)
Please let Mr. Bitter know that his chest x-ray is stable for him and does not show any new changes.  Please ensure that he has filled his antibiotics.  If he is not feeling improved he needs to make an in person visit. Thanks!  LPC

## 2019-12-16 ENCOUNTER — Other Ambulatory Visit: Payer: Self-pay

## 2019-12-16 ENCOUNTER — Ambulatory Visit: Payer: Self-pay

## 2019-12-16 ENCOUNTER — Encounter (HOSPITAL_COMMUNITY): Payer: Self-pay

## 2019-12-16 ENCOUNTER — Inpatient Hospital Stay (HOSPITAL_COMMUNITY)
Admission: EM | Admit: 2019-12-16 | Discharge: 2019-12-20 | DRG: 378 | Disposition: A | Discharge status: Home / Self Care | Payer: Medicare Other | Attending: Family Medicine | Admitting: Family Medicine

## 2019-12-16 DIAGNOSIS — I10 Essential (primary) hypertension: Secondary | ICD-10-CM | POA: Diagnosis present

## 2019-12-16 DIAGNOSIS — K219 Gastro-esophageal reflux disease without esophagitis: Secondary | ICD-10-CM | POA: Diagnosis present

## 2019-12-16 DIAGNOSIS — Z801 Family history of malignant neoplasm of trachea, bronchus and lung: Secondary | ICD-10-CM | POA: Diagnosis not present

## 2019-12-16 DIAGNOSIS — N400 Enlarged prostate without lower urinary tract symptoms: Secondary | ICD-10-CM | POA: Diagnosis present

## 2019-12-16 DIAGNOSIS — Z87891 Personal history of nicotine dependence: Secondary | ICD-10-CM | POA: Diagnosis not present

## 2019-12-16 DIAGNOSIS — F419 Anxiety disorder, unspecified: Secondary | ICD-10-CM | POA: Diagnosis present

## 2019-12-16 DIAGNOSIS — K5731 Diverticulosis of large intestine without perforation or abscess with bleeding: Principal | ICD-10-CM | POA: Diagnosis present

## 2019-12-16 DIAGNOSIS — G8929 Other chronic pain: Secondary | ICD-10-CM | POA: Diagnosis present

## 2019-12-16 DIAGNOSIS — J449 Chronic obstructive pulmonary disease, unspecified: Secondary | ICD-10-CM | POA: Diagnosis not present

## 2019-12-16 DIAGNOSIS — Z888 Allergy status to other drugs, medicaments and biological substances status: Secondary | ICD-10-CM

## 2019-12-16 DIAGNOSIS — Z03818 Encounter for observation for suspected exposure to other biological agents ruled out: Secondary | ICD-10-CM | POA: Diagnosis not present

## 2019-12-16 DIAGNOSIS — R519 Headache, unspecified: Secondary | ICD-10-CM | POA: Diagnosis present

## 2019-12-16 DIAGNOSIS — R569 Unspecified convulsions: Secondary | ICD-10-CM | POA: Diagnosis present

## 2019-12-16 DIAGNOSIS — Z79899 Other long term (current) drug therapy: Secondary | ICD-10-CM | POA: Diagnosis not present

## 2019-12-16 DIAGNOSIS — G2581 Restless legs syndrome: Secondary | ICD-10-CM | POA: Diagnosis present

## 2019-12-16 DIAGNOSIS — Z820 Family history of epilepsy and other diseases of the nervous system: Secondary | ICD-10-CM | POA: Diagnosis not present

## 2019-12-16 DIAGNOSIS — J90 Pleural effusion, not elsewhere classified: Secondary | ICD-10-CM | POA: Diagnosis not present

## 2019-12-16 DIAGNOSIS — Z9981 Dependence on supplemental oxygen: Secondary | ICD-10-CM

## 2019-12-16 DIAGNOSIS — R0602 Shortness of breath: Secondary | ICD-10-CM | POA: Diagnosis not present

## 2019-12-16 DIAGNOSIS — D62 Acute posthemorrhagic anemia: Secondary | ICD-10-CM | POA: Diagnosis not present

## 2019-12-16 DIAGNOSIS — Z902 Acquired absence of lung [part of]: Secondary | ICD-10-CM | POA: Diagnosis not present

## 2019-12-16 DIAGNOSIS — Z720 Tobacco use: Secondary | ICD-10-CM | POA: Diagnosis present

## 2019-12-16 DIAGNOSIS — M545 Low back pain: Secondary | ICD-10-CM | POA: Diagnosis present

## 2019-12-16 DIAGNOSIS — J440 Chronic obstructive pulmonary disease with acute lower respiratory infection: Secondary | ICD-10-CM | POA: Diagnosis present

## 2019-12-16 DIAGNOSIS — K922 Gastrointestinal hemorrhage, unspecified: Secondary | ICD-10-CM | POA: Diagnosis not present

## 2019-12-16 DIAGNOSIS — Z20822 Contact with and (suspected) exposure to covid-19: Secondary | ICD-10-CM | POA: Diagnosis present

## 2019-12-16 DIAGNOSIS — J189 Pneumonia, unspecified organism: Secondary | ICD-10-CM

## 2019-12-16 DIAGNOSIS — J4489 Other specified chronic obstructive pulmonary disease: Secondary | ICD-10-CM | POA: Diagnosis present

## 2019-12-16 DIAGNOSIS — C3490 Malignant neoplasm of unspecified part of unspecified bronchus or lung: Secondary | ICD-10-CM | POA: Diagnosis present

## 2019-12-16 DIAGNOSIS — C3412 Malignant neoplasm of upper lobe, left bronchus or lung: Secondary | ICD-10-CM | POA: Diagnosis present

## 2019-12-16 DIAGNOSIS — K921 Melena: Secondary | ICD-10-CM | POA: Diagnosis not present

## 2019-12-16 LAB — TYPE AND SCREEN
ABO/RH(D): O POS
Antibody Screen: NEGATIVE

## 2019-12-16 LAB — COMPREHENSIVE METABOLIC PANEL
ALT: 16 U/L (ref 0–44)
AST: 21 U/L (ref 15–41)
Albumin: 4.2 g/dL (ref 3.5–5.0)
Alkaline Phosphatase: 70 U/L (ref 38–126)
Anion gap: 7 (ref 5–15)
BUN: 18 mg/dL (ref 8–23)
CO2: 33 mmol/L — ABNORMAL HIGH (ref 22–32)
Calcium: 8.9 mg/dL (ref 8.9–10.3)
Chloride: 100 mmol/L (ref 98–111)
Creatinine, Ser: 0.78 mg/dL (ref 0.61–1.24)
GFR calc Af Amer: >60 mL/min (ref >60)
GFR calc non Af Amer: >60 mL/min (ref >60)
Glucose, Bld: 95 mg/dL (ref 70–99)
Potassium: 4.6 mmol/L (ref 3.5–5.1)
Sodium: 140 mmol/L (ref 135–145)
Total Bilirubin: 0.6 mg/dL (ref 0.3–1.2)
Total Protein: 7.8 g/dL (ref 6.5–8.1)

## 2019-12-16 LAB — CBC
HCT: 37.2 % — ABNORMAL LOW (ref 39.0–52.0)
Hemoglobin: 11.9 g/dL — ABNORMAL LOW (ref 13.0–17.0)
MCH: 28.4 pg (ref 26.0–34.0)
MCHC: 32 g/dL (ref 30.0–36.0)
MCV: 88.8 fL (ref 80.0–100.0)
Platelets: 269 10*3/uL (ref 150–400)
RBC: 4.19 MIL/uL — ABNORMAL LOW (ref 4.22–5.81)
RDW: 13.6 % (ref 11.5–15.5)
WBC: 6.6 10*3/uL (ref 4.0–10.5)
nRBC: 0 % (ref 0.0–0.2)

## 2019-12-16 LAB — POC OCCULT BLOOD, ED: Fecal Occult Bld: POSITIVE — AB

## 2019-12-16 LAB — SARS CORONAVIRUS 2 BY RT PCR (HOSPITAL ORDER, PERFORMED IN ~~LOC~~ HOSPITAL LAB): SARS Coronavirus 2: NEGATIVE

## 2019-12-16 MED ORDER — PANTOPRAZOLE SODIUM 40 MG IV SOLR
40.0000 mg | Freq: Two times a day (BID) | INTRAVENOUS | Status: DC
  Administered 2019-12-16 – 2019-12-19 (×6): 40 mg via INTRAVENOUS
  Filled 2019-12-16 (×6): qty 40

## 2019-12-16 NOTE — ED Triage Notes (Addendum)
Patient states he had bright red blood in his diarrheal stool  9 days ago. Patient states that lasted for 4 days, then 2 days ago the blood was dark and the stool was constipated. Patient states last night he saw black in his underwear last n
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
## 2019-12-17 LAB — COMPREHENSIVE METABOLIC PANEL
ALT: 16 U/L (ref 0–44)
AST: 19 U/L (ref 15–41)
Albumin: 3.5 g/dL (ref 3.5–5.0)
Alkaline Phosphatase: 65 U/L (ref 38–126)
Anion gap: 8 (ref 5–15)
BUN: 15 mg/dL (ref 8–23)
CO2: 30 mmol/L (ref 22–32)
Calcium: 8.6 mg/dL — ABNORMAL LOW (ref 8.9–10.3)
Chloride: 100 mmol/L (ref 98–111)
Creatinine, Ser: 0.74 mg/dL (ref 0.61–1.24)
GFR calc Af Amer: >60 mL/min (ref >60)
GFR calc non Af Amer: >60 mL/min (ref >60)
Glucose, Bld: 81 mg/dL (ref 70–99)
Potassium: 4.2 mmol/L (ref 3.5–5.1)
Sodium: 138 mmol/L (ref 135–145)
Total Bilirubin: 0.8 mg/dL (ref 0.3–1.2)
Total Protein: 6.8 g/dL (ref 6.5–8.1)

## 2019-12-17 LAB — CBC
HCT: 38 % — ABNORMAL LOW (ref 39.0–52.0)
Hemoglobin: 12.4 g/dL — ABNORMAL LOW (ref 13.0–17.0)
MCH: 28.5 pg (ref 26.0–34.0)
MCHC: 32.6 g/dL (ref 30.0–36.0)
MCV: 87.4 fL (ref 80.0–100.0)
Platelets: 255 10*3/uL (ref 150–400)
RBC: 4.35 MIL/uL (ref 4.22–5.81)
RDW: 13.3 % (ref 11.5–15.5)
WBC: 7.3 10*3/uL (ref 4.0–10.5)
nRBC: 0 % (ref 0.0–0.2)

## 2019-12-17 LAB — CBC WITH DIFFERENTIAL/PLATELET
Abs Immature Granulocytes: 0.02 10*3/uL (ref 0.00–0.07)
Basophils Absolute: 0 10*3/uL (ref 0.0–0.1)
Basophils Relative: 1 %
Eosinophils Absolute: 0.3 10*3/uL (ref 0.0–0.5)
Eosinophils Relative: 5 %
HCT: 35.9 % — ABNORMAL LOW (ref 39.0–52.0)
Hemoglobin: 11.6 g/dL — ABNORMAL LOW (ref 13.0–17.0)
Immature Granulocytes: 0 %
Lymphocytes Relative: 15 %
Lymphs Abs: 1 10*3/uL (ref 0.7–4.0)
MCH: 28.4 pg (ref 26.0–34.0)
MCHC: 32.3 g/dL (ref 30.0–36.0)
MCV: 88 fL (ref 80.0–100.0)
Monocytes Absolute: 1 10*3/uL (ref 0.1–1.0)
Monocytes Relative: 14 %
Neutro Abs: 4.7 10*3/uL (ref 1.7–7.7)
Neutrophils Relative %: 65 %
Platelets: 240 10*3/uL (ref 150–400)
RBC: 4.08 MIL/uL — ABNORMAL LOW (ref 4.22–5.81)
RDW: 13.4 % (ref 11.5–15.5)
WBC: 7.1 10*3/uL (ref 4.0–10.5)
nRBC: 0 % (ref 0.0–0.2)

## 2019-12-17 LAB — PHOSPHORUS: Phosphorus: 3.7 mg/dL (ref 2.5–4.6)

## 2019-12-17 LAB — TSH: TSH: 1.426 u[IU]/mL (ref 0.350–4.500)

## 2019-12-17 LAB — MAGNESIUM: Magnesium: 2 mg/dL (ref 1.7–2.4)

## 2019-12-17 MED ORDER — SODIUM CHLORIDE 0.9 % IV SOLN: INTRAVENOUS | Status: AC

## 2019-12-17 MED ORDER — HYDROCODONE-ACETAMINOPHEN 5-325 MG PO TABS: 1.0000 | ORAL_TABLET | ORAL | Status: DC | PRN

## 2019-12-17 MED ORDER — ACETAMINOPHEN 650 MG RE SUPP: 650.0000 mg | Freq: Four times a day (QID) | RECTAL | Status: DC | PRN

## 2019-12-17 MED ORDER — ORAL CARE MOUTH RINSE
15.0000 mL | Freq: Two times a day (BID) | OROMUCOSAL | Status: DC
  Administered 2019-12-17 – 2019-12-20 (×6): 15 mL via OROMUCOSAL

## 2019-12-17 MED ORDER — LORATADINE 10 MG PO TABS
10.0000 mg | ORAL_TABLET | Freq: Every day | ORAL | Status: DC
  Administered 2019-12-17 – 2019-12-20 (×4): 10 mg via ORAL
  Filled 2019-12-17 (×4): qty 1

## 2019-12-17 MED ORDER — ONDANSETRON HCL 4 MG/2ML IJ SOLN: 4.0000 mg | Freq: Four times a day (QID) | INTRAMUSCULAR | Status: DC | PRN

## 2019-12-17 MED ORDER — CLONAZEPAM 0.5 MG PO TABS
0.5000 mg | ORAL_TABLET | Freq: Two times a day (BID) | ORAL | Status: DC
  Administered 2019-12-17 – 2019-12-20 (×8): 0.5 mg via ORAL
  Filled 2019-12-17 (×8): qty 1

## 2019-12-17 MED ORDER — POLYETHYLENE GLYCOL 3350 17 G PO PACK
17.0000 g | PACK | Freq: Two times a day (BID) | ORAL | Status: DC
  Administered 2019-12-17 – 2019-12-20 (×7): 17 g via ORAL
  Filled 2019-12-17 (×7): qty 1

## 2019-12-17 MED ORDER — ALBUTEROL SULFATE (2.5 MG/3ML) 0.083% IN NEBU
2.5000 mg | INHALATION_SOLUTION | RESPIRATORY_TRACT | Status: DC | PRN
  Administered 2019-12-19: 2.5 mg via RESPIRATORY_TRACT
  Filled 2019-12-17: qty 3

## 2019-12-17 MED ORDER — IPRATROPIUM-ALBUTEROL 0.5-2.5 (3) MG/3ML IN SOLN
3.0000 mL | Freq: Four times a day (QID) | RESPIRATORY_TRACT | Status: DC
  Administered 2019-12-17 (×2): 3 mL via RESPIRATORY_TRACT
  Filled 2019-12-17 (×2): qty 3

## 2019-12-17 MED ORDER — TAMSULOSIN HCL 0.4 MG PO CAPS
0.4000 mg | ORAL_CAPSULE | Freq: Every day | ORAL | Status: DC
  Administered 2019-12-17 – 2019-12-20 (×4): 0.4 mg via ORAL
  Filled 2019-12-17 (×4): qty 1

## 2019-12-17 MED ORDER — ONDANSETRON HCL 4 MG PO TABS: 4.0000 mg | ORAL_TABLET | Freq: Four times a day (QID) | ORAL | Status: DC | PRN

## 2019-12-17 MED ORDER — IPRATROPIUM-ALBUTEROL 0.5-2.5 (3) MG/3ML IN SOLN
3.0000 mL | Freq: Three times a day (TID) | RESPIRATORY_TRACT | Status: DC
  Administered 2019-12-18 – 2019-12-19 (×4): 3 mL via RESPIRATORY_TRACT
  Filled 2019-12-17 (×4): qty 3

## 2019-12-17 MED ORDER — MOMETASONE FURO-FORMOTEROL FUM 100-5 MCG/ACT IN AERO
2.0000 | INHALATION_SPRAY | Freq: Two times a day (BID) | RESPIRATORY_TRACT | Status: DC
  Administered 2019-12-17 – 2019-12-20 (×6): 2 via RESPIRATORY_TRACT
  Filled 2019-12-17: qty 8.8

## 2019-12-17 MED ORDER — SODIUM CHLORIDE 0.9% FLUSH
3.0000 mL | Freq: Two times a day (BID) | INTRAVENOUS | Status: DC
  Administered 2019-12-17: 3 mL via INTRAVENOUS

## 2019-12-17 MED ORDER — FLUTTER DEVI: 1.0000 | Status: DC

## 2019-12-17 MED ORDER — ROPINIROLE HCL 1 MG PO TABS
0.5000 mg | ORAL_TABLET | Freq: Every day | ORAL | Status: DC
  Administered 2019-12-17 – 2019-12-19 (×4): 0.5 mg via ORAL
  Filled 2019-12-17 (×4): qty 1

## 2019-12-17 MED ORDER — IPRATROPIUM BROMIDE 0.02 % IN SOLN: 0.5000 mg | Freq: Two times a day (BID) | RESPIRATORY_TRACT | Status: DC | PRN

## 2019-12-17 MED ORDER — ACETAMINOPHEN 325 MG PO TABS
650.0000 mg | ORAL_TABLET | Freq: Four times a day (QID) | ORAL | Status: DC | PRN
  Administered 2019-12-17 – 2019-12-18 (×3): 650 mg via ORAL
  Filled 2019-12-17 (×3): qty 2

## 2019-12-17 MED ORDER — GUAIFENESIN ER 600 MG PO TB12
600.0000 mg | ORAL_TABLET | Freq: Two times a day (BID) | ORAL | Status: DC
  Administered 2019-12-17 – 2019-12-20 (×8): 600 mg via ORAL
  Filled 2019-12-17 (×8): qty 1

## 2019-12-17 NOTE — ED Notes (Addendum)
Attempted to call [facility] for report, there was no answer and no purple man provided.

## 2019-12-17 NOTE — ED Notes (Signed)
Attempted to call report to [facility], RN at [phone], no response. Called [facility] number, stated that they will call back and room assignments might change. Purple man placed at [phone] stated to call report at [phone].

## 2019-12-17 NOTE — ED Notes (Signed)
Attempted to call report to [facility] again, she is in a patient room and will call back.

## 2019-12-17 NOTE — ED Notes (Signed)
Assumed care of pt at this time. Pt sleeping in stretcher, wakes to verbal stimuli. No s/x of acute distress.

## 2019-12-17 NOTE — ED Notes (Signed)
Assumed care of pt at this time. Pt sleeping in stretcher, VSS. No s/sx of acute distress.

## 2019-12-17 NOTE — ED Notes (Signed)
Patient resting quietly, breakfast tray provided.

## 2019-12-17 NOTE — ED Notes (Signed)
Report given to [facility], Therapist, sports.
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
## 2019-12-17 NOTE — Progress Notes (Signed)
PROGRESS NOTE    Jeffrey Hines  SNK:[id number] DOB: 25-May-1946 DOA: 12/16/2019 PCP: [facility]    Brief Narrative:  Jeffrey Hines is a 74 y.o. 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  Last night he started to see black stool in his underwear that concerned him  Denies history of GI bleed in the past he has has intermittent hemorrhoids no abdominal point pain no fevers  He did have a respiratory infection beginning of July but that has resolved.  Patient not on anticoagulation.  He has not had a colonoscopy.  Does not have a GI doctor.  He takes Ibuprofen 400 mg TID   Assessment & Plan:   Active Problems:   COPD with chronic bronchitis (HCC)   Tobacco abuse   Hypertension   Lung cancer (HCC)   Restless leg syndrome   Lower GI bleed   1. GI bleeding.  Suspect lower GI bleed.  No further bowel movements since coming to hospital.  Hemoglobin currently stable.  Gastroenterology following. He does take ibuprofen at home. He is on PPI 2. COPD with chronic bronchitis.  Currently stable.  He is chronically on 3 L of oxygen.  Continue on bronchodilators as needed. 3. Lung cancer.  Chronic, stable, follow-up with pulmonology/oncology 4. Restless leg syndrome.  Continue requip 5. BPH. Continue on flomax   DVT prophylaxis: SCDs Start: 12/17/19 0132  Code Status: Full code Family Communication: Discussed with patient Disposition Plan: Status is: Inpatient  Remains inpatient appropriate because:Hemodynamically unstable and Ongoing diagnostic testing needed not appropriate for  outpatient work up   Dispo: The patient is from: Home              Anticipated d/c is to: Home              Anticipated d/c date is: 1 day              Patient currently is not medically stable to d/c.    Consultants:   Gastroenterology  Procedures:     Antimicrobials:       Subjective: Feels congested.  Not had any further bowel movement since coming to the hospital.  Objective: Vitals:   12/17/19 1057 12/17/19 1130 12/17/19 1214 12/17/19 1255  BP: 137/88 (!) 142/89 (!) 148/103 (!) 138/93  Pulse: 81 77 89 80  Resp: (!) 22 (!) 22 (!) 21 20  Temp: 97.9 F (36.6 C)  97.9 F (36.6 C) 97.9 F (36.6 C)  TempSrc: Oral   Oral  SpO2: 96% 96% 94% 97%  Weight:      Height:       No intake or output data in the 24 hours ending 12/17/19 1307 Filed Weights   12/16/19 1458  Weight: 74.4 kg    Examination:  General exam: Appears calm and comfortable  Respiratory system: Occasional rhonchi bilaterally. Respiratory effort normal. Cardiovascular system: S1 & S2 heard, RRR. No JVD, murmurs, rubs, gallops or clicks. No pedal edema. Gastrointestinal system: Abdomen is nondistended, soft and nontender. No organomegaly or masses felt. Normal bowel sounds heard. Central nervous system: Alert and oriented. No focal neurological deficits. Extremities:  Symmetric 5 x 5 power. Skin: No rashes, lesions or ulcers Psychiatry: Judgement and insight appear normal. Mood & affect appropriate.     Data Reviewed: I have personally reviewed following labs and imaging studies  CBC: Recent Labs  Lab 12/16/19 1515 12/17/19 0519  WBC 6.6 7.1  NEUTROABS  --  4.7  HGB 11.9* 11.6*  HCT 37.2* 35.9*  MCV 88.8 88.0  PLT 269 696   Basic Metabolic Panel: Recent Labs  Lab 12/16/19 1515 12/17/19 0519  NA 140 138  K 4.6 4.2  CL 100 100  CO2 33* 30  GLUCOSE 95 81  BUN 18 15  CREATININE 0.78 0.74  CALCIUM 8.9 8.6*  MG  --  2.0  PHOS  --  3.7   GFR: Estimated Creatinine Clearance:  86.5 mL/min (by C-G formula based on SCr of 0.74 mg/dL). Liver Function Tests: Recent Labs  Lab 12/16/19 1515 12/17/19 0519  AST 21 19  ALT 16 16  ALKPHOS 70 65  BILITOT 0.6 0.8  PROT 7.8 6.8  ALBUMIN 4.2 3.5   No results for input(s): LIPASE, AMYLASE in the last 168 hours. No results for input(s): AMMONIA in the last 168 hours. Coagulation Profile: No results for input(s): INR, PROTIME in the last 168 hours. Cardiac Enzymes: No results for input(s): CKTOTAL, CKMB, CKMBINDEX, TROPONINI in the last 168 hours. BNP (last 3 results) No results for input(s): PROBNP in the last 8760 hours. HbA1C: No results for input(s): HGBA1C in the last 72 hours. CBG: No results for input(s): GLUCAP in the last 168 hours. Lipid Profile: No results for input(s): CHOL, HDL, LDLCALC, TRIG, CHOLHDL, LDLDIRECT in the last 72 hours. Thyroid Function Tests: Recent Labs    12/17/19 0520  TSH 1.426   Anemia Panel: No results for input(s): VITAMINB12, FOLATE, FERRITIN, TIBC, IRON, RETICCTPCT in the last 72 hours. Sepsis Labs: No results for input(s): PROCALCITON, LATICACIDVEN in the last 168 hours.  Recent Results (from the past 240 hour(s))  SARS Coronavirus 2 by RT PCR (hospital order, performed in [facility] hospital lab) Nasopharyngeal Nasopharyngeal Swab     Status: None   Collection Time: 12/16/19  9:00 PM   Specimen: Nasopharyngeal Swab  Result Value Ref Range Status   SARS Coronavirus 2 NEGATIVE NEGATIVE Final    Comment: (NOTE) SARS-CoV-2 target nucleic acids are NOT DETECTED.  The SARS-CoV-2 RNA is generally detectable in upper and lower respiratory specimens during the acute phase of infection. The lowest concentration of SARS-CoV-2 viral copies this assay can detect is 250 copies / mL. A negative result does not preclude SARS-CoV-2 infection and should not be used as the sole basis for treatment or other patient management decisions.  A negative result may occur with improper  specimen collection / handling, submission of specimen other than nasopharyngeal swab, presence of viral mutation(s) within the areas targeted by this assay, and inadequate number of viral copies (<250 copies / mL). A negative result must be combined with clinical observations, patient history, and epidemiological information.  Fact Sheet for Patients:   StrictlyIdeas.no  Fact Sheet for Healthcare Providers: BankingDealers.co.za  This test is not yet approved or  cleared by the [place] FDA and has been authorized for detection and/or diagnosis of SARS-CoV-2 by FDA under an Emergency Use Authorization (EUA).  This EUA will remain in effect (meaning this test can be used) for the duration of the COVID-19 declaration under Section 564(b)(1) of the Act, 21 U.S.C. section 360bbb-3(b)(1), unless the authorization is terminated or  revoked sooner.  Performed at [facility], [place] [address] [place], [place] [address]          Radiology Studies: No results found.      Scheduled Meds:  clonazePAM  0.5 mg Oral BID   guaiFENesin  600 mg Oral BID   ipratropium-albuterol  3 mL Nebulization Q6H   loratadine  10 mg Oral Daily   mometasone-formoterol  2 puff Inhalation BID   pantoprazole (PROTONIX) IV  40 mg Intravenous Q12H   polyethylene glycol  17 g Oral BID   rOPINIRole  0.5 mg Oral QHS   tamsulosin  0.4 mg Oral Daily   Continuous Infusions:   LOS: 1 day    Time spent: 68mins    Kathie Dike, MD [facility]   If 7PM-7AM, please contact night-coverage [email or web address]  12/17/2019, 1:07 PM

## 2019-12-18 ENCOUNTER — Encounter (HOSPITAL_COMMUNITY)
Admission: EM | Disposition: A | Discharge status: Home / Self Care | Payer: Self-pay | Source: Home / Self Care | Attending: Family Medicine

## 2019-12-18 ENCOUNTER — Inpatient Hospital Stay (HOSPITAL_COMMUNITY): Payer: Medicare Other

## 2019-12-18 LAB — CBC
HCT: 38.9 % — ABNORMAL LOW (ref 39.0–52.0)
Hemoglobin: 12.6 g/dL — ABNORMAL LOW (ref 13.0–17.0)
MCH: 28.3 pg (ref 26.0–34.0)
MCHC: 32.4 g/dL (ref 30.0–36.0)
MCV: 87.2 fL (ref 80.0–100.0)
Platelets: 279 10*3/uL (ref 150–400)
RBC: 4.46 MIL/uL (ref 4.22–5.81)
RDW: 13.3 % (ref 11.5–15.5)
WBC: 11.1 10*3/uL — ABNORMAL HIGH (ref 4.0–10.5)
nRBC: 0 % (ref 0.0–0.2)

## 2019-12-18 LAB — BASIC METABOLIC PANEL
Anion gap: 9 (ref 5–15)
BUN: 12 mg/dL (ref 8–23)
CO2: 30 mmol/L (ref 22–32)
Calcium: 8.7 mg/dL — ABNORMAL LOW (ref 8.9–10.3)
Chloride: 99 mmol/L (ref 98–111)
Creatinine, Ser: 0.8 mg/dL (ref 0.61–1.24)
GFR calc Af Amer: >60 mL/min (ref >60)
GFR calc non Af Amer: >60 mL/min (ref >60)
Glucose, Bld: 119 mg/dL — ABNORMAL HIGH (ref 70–99)
Potassium: 4.2 mmol/L (ref 3.5–5.1)
Sodium: 138 mmol/L (ref 135–145)

## 2019-12-18 SURGERY — COLONOSCOPY WITH PROPOFOL
Anesthesia: Monitor Anesthesia Care

## 2019-12-18 MED ORDER — SUMATRIPTAN SUCCINATE 25 MG PO TABS
25.0000 mg | ORAL_TABLET | ORAL | Status: DC | PRN
  Administered 2019-12-18 – 2019-12-19 (×2): 25 mg via ORAL
  Filled 2019-12-18 (×4): qty 1

## 2019-12-18 MED ORDER — ACETAMINOPHEN 500 MG PO TABS
1000.0000 mg | ORAL_TABLET | Freq: Four times a day (QID) | ORAL | Status: DC | PRN
  Administered 2019-12-18 – 2019-12-20 (×3): 1000 mg via ORAL
  Filled 2019-12-18 (×3): qty 2

## 2019-12-18 NOTE — Progress Notes (Signed)
PROGRESS NOTE    Jeffrey Hines  MVH:[id number] DOB: November 05, 1945 DOA: 12/16/2019 PCP: [facility]    Brief Narrative:  Jeffrey Hines is a 74 y.o. 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  Last night he started to see black stool in his underwear that concerned him  Denies history of GI bleed in the past he has has intermittent hemorrhoids no abdominal point pain no fevers  He did have a respiratory infection beginning of July but that has resolved.  Patient not on anticoagulation.  He has not had a colonoscopy.  Does not have a GI doctor.  He takes Ibuprofen 400 mg TID   Assessment & Plan:   Active Problems:   COPD with chronic bronchitis (HCC)   Tobacco abuse   Hypertension   Lung cancer (HCC)   Restless leg syndrome   Lower GI bleed   1. GI bleeding.  Suspect lower GI bleed.  No further bowel movements since coming to hospital.  Hemoglobin currently stable.  Gastroenterology following. Encouraged him to stop IBU, Tylenol 1st choice--Dr.Buccini feels needs to stay 1 moire day given risk of further bleed 2. Chronic Headaches-willing to trial imitrex-started this admit--might need to go to [facility] clinic 3. COPD with chronic bronchitis.  Currently stable.  He is chronically on 3 L of oxygen.  Continue on bronchodilators as needed. 4. Lung cancer.  Chronic, stable, follow-up with pulmonology/oncology 5. Restless leg syndrome.  Continue requip 6. BPH. Continue on flomax   DVT prophylaxis: SCDs Start: 12/17/19 0132  Code Status: Full code Family Communication: Discussed with  patient Disposition Plan: Status is: Inpatient  Remains inpatient appropriate because:Hemodynamically unstable and Ongoing diagnostic testing needed not appropriate for outpatient work up   Dispo: The patient is from: Home              Anticipated d/c is to: Home              Anticipated d/c date is: 1 day              Patient currently is not medically stable to d/c.    Consultants:   Gastroenterology  Procedures:     Antimicrobials:       Subjective: Bad HA's No cp no fever No dark tarry stool Hemoglobin up from 11--->12  Objective: Vitals:   12/17/19 2051 12/18/19 0156 12/18/19 0613 12/18/19 0824  BP: 133/75 130/78 128/86   Pulse: 76 92 95   Resp: 18 17 18    Temp: 99.1 F (37.3 C) 98.5 F (36.9 C) 98.2 F (36.8 C)   TempSrc:  Oral Oral   SpO2: 96% 93% 91% 92%  Weight:      Height:        Intake/Output Summary (Last 24 hours) at 12/18/2019 1543 Last data filed at 12/18/2019 0901 Gross per 24 hour  Intake 736.25 ml  Output 450 ml  Net 286.25 ml   Filed Weights   12/16/19 1458  Weight: 74.4 kg    Examination:  General exam: Appears calm and comfortable  Respiratory system: Occasional rhonchi bilaterally. Respiratory effort normal. Cardiovascular system: S1 & S2  heard, RRR. No JVD, murmurs, rubs, gallops or clicks. No pedal edema. Gastrointestinal system: Abdomen is nondistended, soft and nontender. No organomegaly or masses felt. Normal bowel sounds heard. Central nervous system: Alert and oriented. No focal neurological deficits. Extremities: Symmetric 5 x 5 power. Skin: No rashes, lesions or ulcers Psychiatry: Judgement and insight appear normal. Mood & affect appropriate.     Data Reviewed: I have personally reviewed following labs and imaging studies  CBC: Recent Labs  Lab 12/16/19 1515 12/17/19 0519 12/17/19 1727 12/18/19 0448  WBC 6.6 7.1 7.3 11.1*  NEUTROABS  --  4.7  --   --   HGB 11.9* 11.6* 12.4* 12.6*  HCT 37.2* 35.9* 38.0*  38.9*  MCV 88.8 88.0 87.4 87.2  PLT 269 240 255 353   Basic Metabolic Panel: Recent Labs  Lab 12/16/19 1515 12/17/19 0519 12/18/19 0448  NA 140 138 138  K 4.6 4.2 4.2  CL 100 100 99  CO2 33* 30 30  GLUCOSE 95 81 119*  BUN 18 15 12   CREATININE 0.78 0.74 0.80  CALCIUM 8.9 8.6* 8.7*  MG  --  2.0  --   PHOS  --  3.7  --    GFR: Estimated Creatinine Clearance: 86.5 mL/min (by C-G formula based on SCr of 0.8 mg/dL). Liver Function Tests: Recent Labs  Lab 12/16/19 1515 12/17/19 0519  AST 21 19  ALT 16 16  ALKPHOS 70 65  BILITOT 0.6 0.8  PROT 7.8 6.8  ALBUMIN 4.2 3.5   No results for input(s): LIPASE, AMYLASE in the last 168 hours. No results for input(s): AMMONIA in the last 168 hours. Coagulation Profile: No results for input(s): INR, PROTIME in the last 168 hours. Cardiac Enzymes: No results for input(s): CKTOTAL, CKMB, CKMBINDEX, TROPONINI in the last 168 hours. BNP (last 3 results) No results for input(s): PROBNP in the last 8760 hours. HbA1C: No results for input(s): HGBA1C in the last 72 hours. CBG: No results for input(s): GLUCAP in the last 168 hours. Lipid Profile: No results for input(s): CHOL, HDL, LDLCALC, TRIG, CHOLHDL, LDLDIRECT in the last 72 hours. Thyroid Function Tests: Recent Labs    12/17/19 0520  TSH 1.426   Anemia Panel: No results for input(s): VITAMINB12, FOLATE, FERRITIN, TIBC, IRON, RETICCTPCT in the last 72 hours. Sepsis Labs: No results for input(s): PROCALCITON, LATICACIDVEN in the last 168 hours.  Recent Results (from the past 240 hour(s))  SARS Coronavirus 2 by RT PCR (hospital order, performed in [facility] hospital lab) Nasopharyngeal Nasopharyngeal Swab     Status: None   Collection Time: 12/16/19  9:00 PM   Specimen: Nasopharyngeal Swab  Result Value Ref Range Status   SARS Coronavirus 2 NEGATIVE NEGATIVE Final    Comment: (NOTE) SARS-CoV-2 target nucleic acids are NOT DETECTED.  The SARS-CoV-2 RNA is generally  detectable in upper and lower respiratory specimens during the acute phase of infection. The lowest concentration of SARS-CoV-2 viral copies this assay can detect is 250 copies / mL. A negative result does not preclude SARS-CoV-2 infection and should not be used as the sole basis for treatment or other patient management decisions.  A negative result may occur with improper specimen collection / handling, submission of specimen other than nasopharyngeal swab, presence of viral mutation(s) within the areas targeted by this assay, and inadequate number of viral copies (<250 copies / mL). A negative result must be combined with clinical observations, patient history, and epidemiological information.  Fact Sheet for Patients:   StrictlyIdeas.no  Fact Sheet for Healthcare Providers: BankingDealers.co.za  This test is not yet approved or  cleared by the [place] FDA and has been authorized for detection and/or diagnosis of SARS-CoV-2 by FDA under an Emergency Use Authorization (EUA).  This EUA will remain in effect (meaning this test can be used) for the duration of the COVID-19 declaration under Section 564(b)(1) of the Act, 21 U.S.C. section 360bbb-3(b)(1), unless the authorization is terminated or revoked sooner.  Performed at [facility], [place] [address] [place], [place] [address]          Radiology Studies: DG Chest 2 View  Result Date: 12/18/2019 CLINICAL DATA:  Pneumonia. EXAM: CHEST - 2 VIEW COMPARISON:  12/06/2019 FINDINGS: AP and lateral views of the chest show interval development of diffuse airspace opacity in the residual left lung, finding new in the interval. Persistent volume loss left hemithorax is stable to slightly progressed in the interval. Right lung hyperexpanded without focal consolidation, pneumothorax, or pleural effusion. The visualized bony structures of the thorax show now acute  abnormality. Telemetry leads overlie the chest. IMPRESSION: Residual aerated left lung seen on the previous study is now almost completely opacified compatible with collapse/consolidation. The degree of volume loss in the left hemithorax does not appear substantially progressed in the interval suggesting that the opacity today is likely related to airspace disease such as pneumonia. Electronically Signed   By: Misty Stanley M.D.   On: 12/18/2019 10:24        Scheduled Meds: . clonazePAM  0.5 mg Oral BID  . guaiFENesin  600 mg Oral BID  . ipratropium-albuterol  3 mL Nebulization TID  . loratadine  10 mg Oral Daily  . mouth rinse  15 mL Mouth Rinse BID  . mometasone-formoterol  2 puff Inhalation BID  . pantoprazole (PROTONIX) IV  40 mg Intravenous Q12H  . polyethylene glycol  17 g Oral BID  . rOPINIRole  0.5 mg Oral QHS  . tamsulosin  0.4 mg Oral Daily   Continuous Infusions:   LOS: 2 days    Time spent: 95mins    Nita Sells, MD [facility]   If 7PM-7AM, please contact night-coverage [email or web address]  12/18/2019, 3:43 PM

## 2019-12-18 NOTE — Progress Notes (Signed)
[facility] Gastroenterology Progress Note  Harvie Morua 74 y.o. 1945/11/30  CC:  Rectal bleeding  Subjective: Patient reports feeling well. He denies any abdominal pain or further signs of rectal bleeding. He has not had any bowel movements or rectal bleeding since admission. He has no complaints. His breathing feels better but he is still somewhat short of breath.   ROS : Review of Systems  Respiratory: Positive for shortness of breath. Negative for cough.   Gastrointestinal: Negative for abdominal pain, blood in stool, constipation, diarrhea, heartburn, melena, nausea and vomiting.   Objective: Vital signs in last 24 hours: Vitals:   12/18/19 0613 12/18/19 0824  BP: 128/86   Pulse: 95   Resp: 18   Temp: 98.2 F (36.8 C)   SpO2: 91% 92%    Physical Exam:  General:  Lethargic, elderly, thin, oriented, cooperative, no acute distress; nasal cannula in place  Head:  Normocephalic, without obvious abnormality, atraumatic  Eyes:  Conjunctiva pink, EOMs intact  Lungs:   Coarse lung sounds bilaterally but no respiratory distress  Heart:  Regular rate and rhythm, S1, S2 normal  Abdomen:   Soft, non-tender, non-distended, normoactive bowel sounds, no guarding or peritoneal signs  Extremities: Extremities normal, atraumatic, no  edema  Pulses: 2+ and symmetric    Lab Results: Recent Labs    12/17/19 0519 12/18/19 0448  NA 138 138  K 4.2 4.2  CL 100 99  CO2 30 30  GLUCOSE 81 119*  BUN 15 12  CREATININE 0.74 0.80  CALCIUM 8.6* 8.7*  MG 2.0  --   PHOS 3.7  --    Recent Labs    12/16/19 1515 12/17/19 0519  AST 21 19  ALT 16 16  ALKPHOS 70 65  BILITOT 0.6 0.8  PROT 7.8 6.8  ALBUMIN 4.2 3.5   Recent Labs    12/17/19 0519 12/17/19 0519 12/17/19 1727 12/18/19 0448  WBC 7.1   < > 7.3 11.1*  NEUTROABS 4.7  --   --   --   HGB 11.6*   < > 12.4* 12.6*  HCT 35.9*   < > 38.0* 38.9*  MCV 88.0   < > 87.4 87.2  PLT 240   < > 255 279   < > = values in this interval not  displayed.   No results for input(s): LABPROT, INR in the last 72 hours.  Impression: Rectal bleeding for 10 days, diverticular bleeding most likely, though patient has not had a recent colonoscopy so we cannot entirely exclude polyps/mass. -Hgb 12.6 today, improved from 11.6 yesterday.   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   Soft diet ordered.  Continue supportive care.  Continue to monitor H&H with transfusion as needed to maintain hemoglobin between 7-8.  [facility] will follow.  Salley Slaughter PA-C 12/18/2019, 11:01 AM  Contact #  [phone]

## 2019-12-19 LAB — CBC WITH DIFFERENTIAL/PLATELET
Abs Immature Granulocytes: 0.04 10*3/uL (ref 0.00–0.07)
Basophils Absolute: 0 10*3/uL (ref 0.0–0.1)
Basophils Relative: 0 %
Eosinophils Absolute: 0.2 10*3/uL (ref 0.0–0.5)
Eosinophils Relative: 1 %
HCT: 37.8 % — ABNORMAL LOW (ref 39.0–52.0)
Hemoglobin: 12 g/dL — ABNORMAL LOW (ref 13.0–17.0)
Immature Granulocytes: 0 %
Lymphocytes Relative: 7 %
Lymphs Abs: 1 10*3/uL (ref 0.7–4.0)
MCH: 28.2 pg (ref 26.0–34.0)
MCHC: 31.7 g/dL (ref 30.0–36.0)
MCV: 88.9 fL (ref 80.0–100.0)
Monocytes Absolute: 1.4 10*3/uL — ABNORMAL HIGH (ref 0.1–1.0)
Monocytes Relative: 10 %
Neutro Abs: 11.6 10*3/uL — ABNORMAL HIGH (ref 1.7–7.7)
Neutrophils Relative %: 82 %
Platelets: 260 10*3/uL (ref 150–400)
RBC: 4.25 MIL/uL (ref 4.22–5.81)
RDW: 13.6 % (ref 11.5–15.5)
WBC: 14.2 10*3/uL — ABNORMAL HIGH (ref 4.0–10.5)
nRBC: 0 % (ref 0.0–0.2)

## 2019-12-19 LAB — COMPREHENSIVE METABOLIC PANEL
ALT: 12 U/L (ref 0–44)
AST: 14 U/L — ABNORMAL LOW (ref 15–41)
Albumin: 3.4 g/dL — ABNORMAL LOW (ref 3.5–5.0)
Alkaline Phosphatase: 61 U/L (ref 38–126)
Anion gap: 9 (ref 5–15)
BUN: 17 mg/dL (ref 8–23)
CO2: 30 mmol/L (ref 22–32)
Calcium: 8.4 mg/dL — ABNORMAL LOW (ref 8.9–10.3)
Chloride: 98 mmol/L (ref 98–111)
Creatinine, Ser: 0.9 mg/dL (ref 0.61–1.24)
GFR calc Af Amer: >60 mL/min (ref >60)
GFR calc non Af Amer: >60 mL/min (ref >60)
Glucose, Bld: 125 mg/dL — ABNORMAL HIGH (ref 70–99)
Potassium: 4.3 mmol/L (ref 3.5–5.1)
Sodium: 137 mmol/L (ref 135–145)
Total Bilirubin: 0.7 mg/dL (ref 0.3–1.2)
Total Protein: 7 g/dL (ref 6.5–8.1)

## 2019-12-19 MED ORDER — PANTOPRAZOLE SODIUM 40 MG PO TBEC
40.0000 mg | DELAYED_RELEASE_TABLET | Freq: Two times a day (BID) | ORAL | Status: DC
  Administered 2019-12-19 – 2019-12-20 (×2): 40 mg via ORAL
  Filled 2019-12-19 (×2): qty 1

## 2019-12-19 MED ORDER — SODIUM CHLORIDE 3 % IN NEBU
4.0000 mL | INHALATION_SOLUTION | Freq: Two times a day (BID) | RESPIRATORY_TRACT | Status: DC
  Administered 2019-12-19 – 2019-12-20 (×3): 4 mL via RESPIRATORY_TRACT
  Filled 2019-12-19 (×4): qty 4

## 2019-12-19 MED ORDER — IPRATROPIUM-ALBUTEROL 0.5-2.5 (3) MG/3ML IN SOLN
3.0000 mL | Freq: Two times a day (BID) | RESPIRATORY_TRACT | Status: DC
  Administered 2019-12-19 – 2019-12-20 (×2): 3 mL via RESPIRATORY_TRACT
  Filled 2019-12-19: qty 3

## 2019-12-19 MED ORDER — FUROSEMIDE 10 MG/ML IJ SOLN
40.0000 mg | Freq: Once | INTRAMUSCULAR | Status: AC
  Administered 2019-12-19: 40 mg via INTRAVENOUS
  Filled 2019-12-19: qty 4

## 2019-12-19 NOTE — Progress Notes (Signed)
PROGRESS NOTE    Jeffrey Hines  VOZ:[id number] DOB: 10/02/1945 DOA: 12/16/2019 PCP: [facility]   Brief Narrative:  74 y.o. 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  Last night he started to see black stool in his underwear that concerned him  Denies history of GI bleed in the past he has has intermittent hemorrhoids no abdominal point pain no fevers  He did have a respiratory infection beginning of July but that has resolved.  Patient not on anticoagulation.  He has not had a colonoscopy.  Does not have a GI doctor.  He takes Ibuprofen 400 mg TID   Assessment & Plan:   Active Problems:   COPD with chronic bronchitis (HCC)   Tobacco abuse   Hypertension   Lung cancer (HCC)   Restless leg syndrome   Lower GI bleed   1. GI bleeding.  Suspect lower GI bleed.  No further bowel movements since coming to hospital.  Hemoglobin currently stable.  Gastroenterology following. Encouraged him to stop IBU, Tylenol 1st choice--Dr.Buccini feels needs to stay 1 moire day given risk of further bleed 2. Pneumonia? 1. Patient tells me that he has been treated almost every month for the first 7 days of the month with alternating Augmentin and doxycycline 2. Previously this was Levaquin 3. I feel that based on x-ray done 7/14 he may have fluid and or a pneumonia and hence I spoke with Jeffrey Hines of critical care who concurs-we will trial Lasix and repeat chest x-ray in addition to starting hypertonic saline nebs 4. If on x-ray there is a pneumonia present I will discharge him on a 5-day  course of Augmentin 5. I will CC the patient's pulmonologist Dr. Carlis Abbott 3. Chronic Headaches-willing to trial imitrex-started this admit--might need to go to [facility] clinic as op 4. COPD with chronic bronchitis.  Currently stable.  He is chronically on 3 L of oxygen.  Continue on bronchodilators as needed. 5. Lung cancer.  Chronic, stable, follow-up with pulmonology/oncology 6. Restless leg syndrome.  Continue requip 7. BPH. Continue on flomax   DVT prophylaxis: SCDs Start: 12/17/19 0132  Code Status: Full code Family Communication: Discussed with patient Disposition Plan: Status is: Inpatient  Remains inpatient appropriate because:Hemodynamically unstable and Ongoing diagnostic testing needed not appropriate for outpatient work up   Dispo: The patient is from: Home              Anticipated d/c is to: Home              Anticipated d/c date is: 1 day              Patient currently is not medically stable to d/c.    Consultants:   Gastroenterology  Procedures:     Antimicrobials:       Subjective: Improved overall Eating and drinking Tells me he wants to go home and has not had any bleeding No chest pain no fever  Objective: Vitals:   12/19/19 0832 12/19/19 1304 12/19/19 1336 12/19/19 1355  BP:  (!) 93/59 99/66   Pulse:  (!) 101 95   Resp:  20  Temp:  98.5 F (36.9 C)    TempSrc:      SpO2: 94% 95%  91%  Weight:      Height:        Intake/Output Summary (Last 24 hours) at 12/19/2019 1750 Last data filed at 12/19/2019 1306 Gross per 24 hour  Intake 960 ml  Output 1150 ml  Net -190 ml   Filed Weights   12/16/19 1458  Weight: 74.4 kg    Examination:  General exam: Appears calm and comfortable without any cardiorespiratory distress Respiratory system: Occasional rhonchi bilaterally no rales no rhonchi Cardiovascular system: S1 & S2 heard, RRR.  Gastrointestinal system: Abdomen is nondistended, soft and nontender. No organomegaly or masses felt. Normal  bowel sounds heard. Central nervous system: Alert and oriented. No focal neurological deficits. Extremities: Symmetric 5 x 5 power. Skin: No rashes, lesions or ulcers Psychiatry: Judgement and insight appear normal. Mood & affect appropriate.     Data Reviewed: I have personally reviewed following labs and imaging studies Chest x-ray personally reviewed and over read shows some fluid in the area of his lobectomy on the left without white out however there is further consolidation and possible atelectasis based on deviation of the trachea towards the left side   CBC: Recent Labs  Lab 12/16/19 1515 12/17/19 0519 12/17/19 1727 12/18/19 0448 12/19/19 0816  WBC 6.6 7.1 7.3 11.1* 14.2*  NEUTROABS  --  4.7  --   --  11.6*  HGB 11.9* 11.6* 12.4* 12.6* 12.0*  HCT 37.2* 35.9* 38.0* 38.9* 37.8*  MCV 88.8 88.0 87.4 87.2 88.9  PLT 269 240 255 279 270   Basic Metabolic Panel: Recent Labs  Lab 12/16/19 1515 12/17/19 0519 12/18/19 0448 12/19/19 0816  NA 140 138 138 137  K 4.6 4.2 4.2 4.3  CL 100 100 99 98  CO2 33* 30 30 30   GLUCOSE 95 81 119* 125*  BUN 18 15 12 17   CREATININE 0.78 0.74 0.80 0.90  CALCIUM 8.9 8.6* 8.7* 8.4*  MG  --  2.0  --   --   PHOS  --  3.7  --   --    GFR: Estimated Creatinine Clearance: 76.9 mL/min (by C-G formula based on SCr of 0.9 mg/dL). Liver Function Tests: Recent Labs  Lab 12/16/19 1515 12/17/19 0519 12/19/19 0816  AST 21 19 14*  ALT 16 16 12   ALKPHOS 70 65 61  BILITOT 0.6 0.8 0.7  PROT 7.8 6.8 7.0  ALBUMIN 4.2 3.5 3.4*   No results for input(s): LIPASE, AMYLASE in the last 168 hours. No results for input(s): AMMONIA in the last 168 hours. Coagulation Profile: No results for input(s): INR, PROTIME in the last 168 hours. Cardiac Enzymes: No results for input(s): CKTOTAL, CKMB, CKMBINDEX, TROPONINI in the last 168 hours. BNP (last 3 results) No results for input(s): PROBNP in the last 8760 hours. HbA1C: No results for input(s): HGBA1C in  the last 72 hours. CBG: No results for input(s): GLUCAP in the last 168 hours. Lipid Profile: No results for input(s): CHOL, HDL, LDLCALC, TRIG, CHOLHDL, LDLDIRECT in the last 72 hours. Thyroid Function Tests: Recent Labs    12/17/19 0520  TSH 1.426   Anemia Panel: No results for input(s): VITAMINB12, FOLATE, FERRITIN, TIBC, IRON, RETICCTPCT in the last 72 hours. Sepsis Labs: No results for input(s): PROCALCITON, LATICACIDVEN in the last 168 hours.  Recent Results (from the past 240 hour(s))  SARS Coronavirus 2 by RT PCR (hospital order, performed in [facility] hospital  lab) Nasopharyngeal Nasopharyngeal Swab     Status: None   Collection Time: 12/16/19  9:00 PM   Specimen: Nasopharyngeal Swab  Result Value Ref Range Status   SARS Coronavirus 2 NEGATIVE NEGATIVE Final    Comment: (NOTE) SARS-CoV-2 target nucleic acids are NOT DETECTED.  The SARS-CoV-2 RNA is generally detectable in upper and lower respiratory specimens during the acute phase of infection. The lowest concentration of SARS-CoV-2 viral copies this assay can detect is 250 copies / mL. A negative result does not preclude SARS-CoV-2 infection and should not be used as the sole basis for treatment or other patient management decisions.  A negative result may occur with improper specimen collection / handling, submission of specimen other than nasopharyngeal swab, presence of viral mutation(s) within the areas targeted by this assay, and inadequate number of viral copies (<250 copies / mL). A negative result must be combined with clinical observations, patient history, and epidemiological information.  Fact Sheet for Patients:   StrictlyIdeas.no  Fact Sheet for Healthcare Providers: BankingDealers.co.za  This test is not yet approved or  cleared by the [place] FDA and has been authorized for detection and/or diagnosis of SARS-CoV-2 by FDA under an Emergency Use  Authorization (EUA).  This EUA will remain in effect (meaning this test can be used) for the duration of the COVID-19 declaration under Section 564(b)(1) of the Act, 21 U.S.C. section 360bbb-3(b)(1), unless the authorization is terminated or revoked sooner.  Performed at [facility], [place] [address], [place], [place] [address]      Radiology Studies: DG Chest 2 View  Result Date: 12/18/2019 CLINICAL DATA:  Pneumonia. EXAM: CHEST - 2 VIEW COMPARISON:  12/06/2019 FINDINGS: AP and lateral views of the chest show interval development of diffuse airspace opacity in the residual left lung, finding new in the interval. Persistent volume loss left hemithorax is stable to slightly progressed in the interval. Right lung hyperexpanded without focal consolidation, pneumothorax, or pleural effusion. The visualized bony structures of the thorax show now acute abnormality. Telemetry leads overlie the chest. IMPRESSION: Residual aerated left lung seen on the previous study is now almost completely opacified compatible with collapse/consolidation. The degree of volume loss in the left hemithorax does not appear substantially progressed in the interval suggesting that the opacity today is likely related to airspace disease such as pneumonia. Electronically Signed   By: Misty Stanley M.D.   On: 12/18/2019 10:24     Scheduled Meds: . clonazePAM  0.5 mg Oral BID  . guaiFENesin  600 mg Oral BID  . ipratropium-albuterol  3 mL Nebulization BID  . loratadine  10 mg Oral Daily  . mouth rinse  15 mL Mouth Rinse BID  . mometasone-formoterol  2 puff Inhalation BID  . pantoprazole  40 mg Oral BID  . polyethylene glycol  17 g Oral BID  . rOPINIRole  0.5 mg Oral QHS  . sodium chloride HYPERTONIC  4 mL Nebulization BID  . tamsulosin  0.4 mg Oral Daily   Continuous Infusions:   LOS: 3 days    Time spent: 90mins  Nita Sells, MD [facility]   If 7PM-7AM, please contact  night-coverage [email or web address]  12/19/2019, 5:50 PM

## 2019-12-19 NOTE — Care Management Important Message (Signed)
Important Message  Patient Details IM Letter given to Gabriel Earing RN Case Manager to present to the Patient Name: Jeffrey Hines MRN: [id number] Date of Birth: Jan 10, 1946   Medicare Important Message Given:  Yes     Kerin Salen 12/19/2019, 11:12 AM

## 2019-12-19 NOTE — Progress Notes (Signed)
No BMs, despite daily MiraLAX since admission.  No evidence of bleeding, although hemoglobin today is slightly lower than yesterday (12.0, versus 12.6).  Impression: quiescent lower GI bleed, almost certainly diverticular in origin  Plan:  1.  Discharge from the GI perspective is okay, whenever the patient is otherwise felt to be ready to go home (for example, from the pulmonary perspective).  I explained to the patient that his probability of re-bleeding at this point is probably less than 10%.  We will sign off.  Please call us if you have any questions pertaining to this patient's care  2.  Since the patient is a regular user of ibuprofen, I would favor ongoing standard dose PPI prophylaxis to prevent bleeding ulcers in the future (for example, omeprazole or pantoprazole 40 mg once daily)  3.  Since the patient does not have a primary physician readily available (he goes to the [place] [facility] in [place]), we have made the patient a follow-up appointment with Verdie Shire, PA-C to be seen in our office on July 29 at 12:30 PM and he was given our card for that purpose.  Cleotis Nipper, M.D. Pager [phone] If no answer or after 5 PM call [phone]

## 2019-12-20 ENCOUNTER — Inpatient Hospital Stay (HOSPITAL_COMMUNITY): Payer: Medicare Other

## 2019-12-20 LAB — CBC WITH DIFFERENTIAL/PLATELET
Abs Immature Granulocytes: 0.05 10*3/uL (ref 0.00–0.07)
Basophils Absolute: 0 10*3/uL (ref 0.0–0.1)
Basophils Relative: 0 %
Eosinophils Absolute: 0.1 10*3/uL (ref 0.0–0.5)
Eosinophils Relative: 1 %
HCT: 36.4 % — ABNORMAL LOW (ref 39.0–52.0)
Hemoglobin: 11.8 g/dL — ABNORMAL LOW (ref 13.0–17.0)
Immature Granulocytes: 0 %
Lymphocytes Relative: 7 %
Lymphs Abs: 0.8 10*3/uL (ref 0.7–4.0)
MCH: 28.6 pg (ref 26.0–34.0)
MCHC: 32.4 g/dL (ref 30.0–36.0)
MCV: 88.3 fL (ref 80.0–100.0)
Monocytes Absolute: 1.4 10*3/uL — ABNORMAL HIGH (ref 0.1–1.0)
Monocytes Relative: 11 %
Neutro Abs: 9.7 10*3/uL — ABNORMAL HIGH (ref 1.7–7.7)
Neutrophils Relative %: 81 %
Platelets: 267 10*3/uL (ref 150–400)
RBC: 4.12 MIL/uL — ABNORMAL LOW (ref 4.22–5.81)
RDW: 13.6 % (ref 11.5–15.5)
WBC: 12.1 10*3/uL — ABNORMAL HIGH (ref 4.0–10.5)
nRBC: 0 % (ref 0.0–0.2)

## 2019-12-20 LAB — BASIC METABOLIC PANEL
Anion gap: 14 (ref 5–15)
BUN: 22 mg/dL (ref 8–23)
CO2: 24 mmol/L (ref 22–32)
Calcium: 8.4 mg/dL — ABNORMAL LOW (ref 8.9–10.3)
Chloride: 99 mmol/L (ref 98–111)
Creatinine, Ser: 0.82 mg/dL (ref 0.61–1.24)
GFR calc Af Amer: >60 mL/min (ref >60)
GFR calc non Af Amer: >60 mL/min (ref >60)
Glucose, Bld: 127 mg/dL — ABNORMAL HIGH (ref 70–99)
Potassium: 4.3 mmol/L (ref 3.5–5.1)
Sodium: 137 mmol/L (ref 135–145)

## 2019-12-20 MED ORDER — GUAIFENESIN ER 600 MG PO TB12: 600.0000 mg | ORAL_TABLET | Freq: Two times a day (BID) | ORAL | 0 refills | Status: AC

## 2019-12-20 MED ORDER — MOMETASONE FURO-FORMOTEROL FUM 100-5 MCG/ACT IN AERO: 2.0000 | INHALATION_SPRAY | Freq: Two times a day (BID) | RESPIRATORY_TRACT | 0 refills | Status: DC

## 2019-12-20 MED ORDER — SUMATRIPTAN SUCCINATE 25 MG PO TABS: 25.0000 mg | ORAL_TABLET | ORAL | 0 refills | Status: DC | PRN

## 2019-12-20 MED ORDER — AMOXICILLIN-POT CLAVULANATE 875-125 MG PO TABS
1.0000 | ORAL_TABLET | Freq: Two times a day (BID) | ORAL | 0 refills | Status: DC
Start: 2019-12-20 — End: 2020-01-07

## 2019-12-20 MED ORDER — POLYETHYLENE GLYCOL 3350 17 G PO PACK: 17.0000 g | PACK | Freq: Two times a day (BID) | ORAL | 0 refills | Status: DC

## 2019-12-20 MED ORDER — PANTOPRAZOLE SODIUM 40 MG PO TBEC: 40.0000 mg | DELAYED_RELEASE_TABLET | Freq: Two times a day (BID) | ORAL | 3 refills | Status: DC

## 2019-12-20 MED ORDER — ALBUTEROL SULFATE (2.5 MG/3ML) 0.083% IN NEBU: 2.5000 mg | INHALATION_SOLUTION | RESPIRATORY_TRACT | 12 refills | Status: AC | PRN

## 2019-12-20 NOTE — Progress Notes (Signed)
AVS given to patient and explained at the bedside. Medications and follow up appointments have been explained with pt verbalizing understanding.  

## 2019-12-20 NOTE — Discharge Summary (Signed)
Physician Discharge Summary  Jeffrey Hines PNT:[id number] DOB: 1945/09/05 DOA: 12/16/2019  PCP: [facility]  Admit date: 12/16/2019 Discharge date: 12/20/2019  Time spent: 40 minutes  Recommendations for Outpatient Follow-up:  1. Needs outpatient checks x-ray 2. We will CC pulmonology as well need outpatient follow-up 3. Protonix started this admission no NSAIDs on discharge reinforced to patient 4. 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 Last night he started to see black stool in his underwear that concerned him  Denies history of GI bleed in the past he has has intermittent hemorrhoids no abdominal point pain no fevers  He did have a respiratory infection beginning of July but that has resolved. Patient not on anticoagulation. He has not had a colonoscopy. Does not have a GI doctor.  He takes Ibuprofen 400 mg TID  Hospital Course:  1. GI bleeding.  Suspect lower GI bleed.  No further bowel movements since coming to hospital.  Hemoglobin currently stable.  Gastroenterology following. 2. Pneumonia? 1. Patient tells me that he has been  treated almost every month for the first 7 days of the month with alternating Augmentin and doxycycline 2. Previously this was Levaquin 3. CXR performed 7/14 and repeated 7/16 show possible pneumonia so we will treat with Augmentin for 7 days as he is afebrile 4. I will CC the patient's pulmonologist Dr. Carlis Abbott 3. Chronic Headaches-willing to trial imitrex-started this admit--might need to go to [facility] clinic as op 4. COPD with chronic bronchitis.  Currently stable.  He is chronically on 3 L of oxygen.  Continue on bronchodilators as needed. 1. Patient relates to me that he has allergies and intolerances to various inhalers and gets swelling with the same 2. Forwarding to his pulmonologist 5. Lung cancer.  Chronic, stable, follow-up with pulmonology/oncology 6. Restless leg syndrome.  Continue requip 7. BPH. Continue on flomax  Procedures: CT scan showing diverticular hemorrhage Consultations:  Dr. Annette Stable gastroenterology  Discharge Exam: Vitals:   12/20/19 0813 12/20/19 0824  BP:    Pulse:    Resp:    Temp: 98.9 F (37.2 C)   SpO2:  93%    General: Awake alert coherent no distress EOMI NCAT no focal deficit setting on 2 L of oxygen at this time Cardiovascular: S1-S2 no murmur rub or gallop slightly tachycardic no murmur rub or gallop Respiratory: Increased egophony left side no rales no rhonchi No lower extremity edema  Discharge Instructions   Discharge Instructions    Diet - low sodium heart healthy   Complete by: As directed    Discharge instructions   Complete by: As directed    Ensure that you get follow-up with Dr. Carlis Abbott in the  outpatient setting to discuss your issues with some of the inhalers Continue Protonix twice daily going forward for at least 3 months to prevent further bleeding and do not take any Goody powders You will notice that I have started you on Imitrex which will help with migraines-you might require something else for tension related headaches  and I would encourage you to follow-up with your primary care physician with regards to this You will need a chest x-ray in about 1 month-as discussed because you still have some areas in your lung that have pneumonia I would start you on Augmentin complete the course and follow-up with your pulmonologist Absolutely no nonsteroidal such as ibuprofen in the outpatient setting Good luck   Increase activity slowly   Complete by: As directed      Allergies as of 12/20/2019      Reactions   Anoro Ellipta [umeclidinium-vilanterol] Hives   Prolixin [fluphenazine]    Severe back pain   Advair Diskus [fluticasone-salmeterol] Anxiety      Medication List    STOP taking these medications   Bevespi Aerosphere 9-4.8 MCG/ACT Aero Generic drug: Glycopyrrolate-Formoterol   doxycycline 100 MG tablet Commonly known as: VIBRA-TABS   ibuprofen 200 MG tablet Commonly known as: ADVIL   ipratropium 0.02 % nebulizer solution Commonly known as: ATROVENT   levofloxacin 500 MG tablet Commonly known as: LEVAQUIN     TAKE these medications   albuterol (2.5 MG/3ML) 0.083% nebulizer solution Commonly known as: PROVENTIL Take 3 mLs (2.5 mg total) by nebulization every 2 (two) hours as needed for wheezing or shortness of breath.   amoxicillin-clavulanate 875-125 MG tablet Commonly known as: AUGMENTIN Take 1 tablet by mouth 2 (two) times daily. What changed: Another medication with the same name was removed. Continue taking this medication, and follow the directions you see here.   BIOFLEX PO Take 1 tablet by mouth 2 (two) times daily.   clonazePAM 0.5 MG tablet Commonly known as: KLONOPIN Take 0.5 mg by mouth 2 (two) times daily.   Flutter Devi 1 Device by Does not apply route as directed.   guaiFENesin 600 MG 12 hr tablet Commonly known as: MUCINEX Take 1 tablet (600 mg total) by mouth 2 (two) times daily.   loratadine 10 MG tablet Commonly known as: CLARITIN Take 10 mg by mouth in the  morning and at bedtime.   mometasone-formoterol 100-5 MCG/ACT Aero Commonly known as: DULERA Inhale 2 puffs into the lungs 2 (two) times daily.   pantoprazole 40 MG tablet Commonly known as: PROTONIX Take 1 tablet (40 mg total) by mouth 2 (two) times daily.   polyethylene glycol 17 g packet Commonly known as: MIRALAX / GLYCOLAX Take 17 g by mouth 2 (two) times daily.   rOPINIRole 0.5 MG tablet Commonly known as: REQUIP Take 1 tablet (0.5 mg total) by mouth at bedtime.   Saw Palmetto 450 MG Caps Take 3 capsules by mouth in the morning and at bedtime. Take 3 capsules in the morning and Take 1 capsule at bedtime   sodium chloride HYPERTONIC 3 % nebulizer solution Take by nebulization 2 (two) times daily.   Stiolto Respimat 2.5-2.5 MCG/ACT Aers Generic drug: Tiotropium Bromide-Olodaterol Inhale 1 puff into the lungs daily.   SUMAtriptan 25 MG tablet Commonly known as: IMITREX Take 1 tablet (25 mg total) by mouth every 2 (two) hours as needed for migraine or headache. May repeat in 2 hours if headache persists or recurs.   tamsulosin 0.4 MG Caps capsule Commonly known  as: FLOMAX Take 1 capsule (0.4 mg total) by mouth daily.      Allergies  Allergen Reactions  . Anoro Ellipta [Umeclidinium-Vilanterol] Hives  . Prolixin [Fluphenazine]     Severe back pain  . Advair Diskus [Fluticasone-Salmeterol] Anxiety    Follow-up Information    Salley Slaughter, PA-C Follow up in 14 day(s).   Specialty: Physician Assistant Why: Follow-up appointment at [facility] scheduled on 7/29 at 12:30pm. Contact information: [place]  [address] [phone]                The results of significant diagnostics from this hospitalization (including imaging, microbiology, ancillary and laboratory) are listed below for reference.    Significant Diagnostic Studies: DG Chest 2 View  Result Date: 12/20/2019 CLINICAL DATA:  74 year old male with  a history of pneumonia, shortness of breath EXAM: CHEST - 2 VIEW COMPARISON:  12/18/2019, 12/06/2019 FINDINGS: Similar rightward shift of the mediastinum and the tracheal air column. Right heart border and left heart borders are not well visualized. Persisting opacity in the upper left lung and lower left lung with minimal residual aeration in the mid lung. Coarsened interstitial markings through the right lung. No displaced fracture. Left chest wall deformity again demonstrated. Degenerative changes of the spine. IMPRESSION: Similar appearance of the chest x-ray to the most recent prior, with minimal aeration in the left lung and leftward shift of the mediastinum compatible with volume loss, chronic changes/surgical changes, and suspected airway obstruction/pneumonia, possibly aspiration. Electronically Signed   By: Corrie Mckusick D.O.   On: 12/20/2019 09:53   DG Chest 2 View  Result Date: 12/18/2019 CLINICAL DATA:  Pneumonia. EXAM: CHEST - 2 VIEW COMPARISON:  12/06/2019 FINDINGS: AP and lateral views of the chest show interval development of diffuse airspace opacity in the residual left lung, finding new in the interval. Persistent volume loss left hemithorax is stable to slightly progressed in the interval. Right lung hyperexpanded without focal consolidation, pneumothorax, or pleural effusion. The visualized bony structures of the thorax show now acute abnormality. Telemetry leads overlie the chest. IMPRESSION: Residual aerated left lung seen on the previous study is now almost completely opacified compatible with collapse/consolidation. The degree of volume loss in the left hemithorax does not appear substantially progressed in the interval suggesting that the opacity today is likely related to airspace disease such as pneumonia. Electronically Signed   By: Misty Stanley M.D.   On: 12/18/2019 10:24   DG Chest 2 View  Result Date: 12/07/2019 CLINICAL DATA:  COPD. Increased cough, congestion, shortness of  breath for 2 weeks. History of smoking. Previous LEFT UPPER lobectomy. EXAM: CHEST - 2 VIEW COMPARISON:  04/22/2019 FINDINGS: Postoperative volume loss in the LEFT hemithorax appears stable. Mildly prominent interstitial markings in the RIGHT lung appear stable. Patchy opacities in the LEFT lung appear stable and are likely chronic. Multiple remote LEFT rib fractures. IMPRESSION: No evidence for acute cardiopulmonary abnormality. Stable postoperative changes. Electronically Signed   By: Nolon Nations M.D.   On: 12/07/2019 17:37    Microbiology: Recent Results (from the past 240 hour(s))  SARS Coronavirus 2 by RT PCR (hospital order, performed in [facility] hospital lab) Nasopharyngeal Nasopharyngeal Swab     Status: None   Collection Time: 12/16/19  9:00 PM   Specimen: Nasopharyngeal Swab  Result Value Ref Range Status   SARS Coronavirus 2 NEGATIVE NEGATIVE Final    Comment: (NOTE) SARS-CoV-2 target nucleic acids are NOT DETECTED.  The SARS-CoV-2 RNA  is generally detectable in upper and lower respiratory specimens during the acute phase of infection. The lowest concentration of SARS-CoV-2 viral copies this assay can detect is 250 copies / mL. A negative result does not preclude SARS-CoV-2 infection and should not be used as the sole basis for treatment or other patient management decisions.  A negative result may occur with improper specimen collection / handling, submission of specimen other than nasopharyngeal swab, presence of viral mutation(s) within the areas targeted by this assay, and inadequate number of viral copies (<250 copies / mL). A negative result must be combined with clinical observations, patient history, and epidemiological information.  Fact Sheet for Patients:   StrictlyIdeas.no  Fact Sheet for Healthcare Providers: BankingDealers.co.za  This test is not yet approved or  cleared by the [place] FDA and has  been authorized for detection and/or diagnosis of SARS-CoV-2 by FDA under an Emergency Use Authorization (EUA).  This EUA will remain in effect (meaning this test can be used) for the duration of the COVID-19 declaration under Section 564(b)(1) of the Act, 21 U.S.C. section 360bbb-3(b)(1), unless the authorization is terminated or revoked sooner.  Performed at [facility], [place] [address] [place], [place] [address]      Labs: Basic Metabolic Panel: Recent Labs  Lab 12/16/19 1515 12/17/19 0519 12/18/19 0448 12/19/19 0816 12/20/19 0421  NA 140 138 138 137 137  K 4.6 4.2 4.2 4.3 4.3  CL 100 100 99 98 99  CO2 33* 30 30 30 24   GLUCOSE 95 81 119* 125* 127*  BUN 18 15 12 17 22   CREATININE 0.78 0.74 0.80 0.90 0.82  CALCIUM 8.9 8.6* 8.7* 8.4* 8.4*  MG  --  2.0  --   --   --   PHOS  --  3.7  --   --   --    Liver Function Tests: Recent Labs  Lab 12/16/19 1515 12/17/19 0519 12/19/19 0816  AST 21 19 14*  ALT 16 16 12   ALKPHOS 70 65 61  BILITOT 0.6 0.8 0.7  PROT 7.8 6.8 7.0  ALBUMIN 4.2 3.5 3.4*   No results for input(s): LIPASE, AMYLASE in the last 168 hours. No results for input(s): AMMONIA in the last 168 hours. CBC: Recent Labs  Lab 12/17/19 0519 12/17/19 1727 12/18/19 0448 12/19/19 0816 12/20/19 0421  WBC 7.1 7.3 11.1* 14.2* 12.1*  NEUTROABS 4.7  --   --  11.6* 9.7*  HGB 11.6* 12.4* 12.6* 12.0* 11.8*  HCT 35.9* 38.0* 38.9* 37.8* 36.4*  MCV 88.0 87.4 87.2 88.9 88.3  PLT 240 255 279 260 267   Cardiac Enzymes: No results for input(s): CKTOTAL, CKMB, CKMBINDEX, TROPONINI in the last 168 hours. BNP: BNP (last 3 results) No results for input(s): BNP in the last 8760 hours.  ProBNP (last 3 results) No results for input(s): PROBNP in the last 8760 hours.  CBG: No results for input(s): GLUCAP in the last 168 hours.     Signed:  Nita Sells MD   [facility] 12/20/2019, 11:27 AM

## 2019-12-20 NOTE — TOC Progression Note (Signed)
Transition of Care [facility]) - Progression Note    Patient Details  Name: Jeffrey Hines MRN: [id number] Date of Birth: 09-May-1946  Transition of [facility]) CM/SW Contact  Purcell Mouton, RN Phone Number: 12/20/2019, 11:44 AM  Clinical Narrative:     Spoke with pt and wife on 7/15 there are no HH needs at present time.   Expected Discharge Plan: Home/Self Care Barriers to Discharge: No Barriers Identified  Expected Discharge Plan and Services Expected Discharge Plan: Home/Self Care       Living arrangements for the past 2 months: Single Family Home Expected Discharge Date: 12/20/19                                     Social Determinants of Health (SDOH) Interventions    Readmission Risk Interventions Readmission Risk Prevention Plan 10/10/2018  Post Dischage Appt Complete  Medication Screening Complete  Transportation Screening Complete  Some recent data might be hidden

## 2020-01-02 DIAGNOSIS — D649 Anemia, unspecified: Secondary | ICD-10-CM | POA: Diagnosis not present

## 2020-01-02 DIAGNOSIS — K5790 Diverticulosis of intestine, part unspecified, without perforation or abscess without bleeding: Secondary | ICD-10-CM | POA: Diagnosis not present

## 2020-01-02 DIAGNOSIS — K625 Hemorrhage of anus and rectum: Secondary | ICD-10-CM | POA: Diagnosis not present

## 2020-01-07 ENCOUNTER — Inpatient Hospital Stay: Payer: Medicare Other | Admitting: Adult Health

## 2020-01-07 ENCOUNTER — Ambulatory Visit (INDEPENDENT_AMBULATORY_CARE_PROVIDER_SITE_OTHER): Payer: Medicare Other | Admitting: Primary Care

## 2020-01-07 ENCOUNTER — Encounter: Payer: Self-pay | Admitting: Primary Care

## 2020-01-07 ENCOUNTER — Other Ambulatory Visit: Payer: Self-pay

## 2020-01-07 ENCOUNTER — Other Ambulatory Visit: Payer: Self-pay | Admitting: Primary Care

## 2020-01-07 VITALS — BP 122/88 | HR 111 | Temp 97.3°F | Ht 71.0 in | Wt 173.6 lb

## 2020-01-07 DIAGNOSIS — R519 Headache, unspecified: Secondary | ICD-10-CM | POA: Insufficient documentation

## 2020-01-07 DIAGNOSIS — G4489 Other headache syndrome: Secondary | ICD-10-CM

## 2020-01-07 DIAGNOSIS — R0600 Dyspnea, unspecified: Secondary | ICD-10-CM | POA: Diagnosis not present

## 2020-01-07 DIAGNOSIS — J189 Pneumonia, unspecified organism: Secondary | ICD-10-CM

## 2020-01-07 DIAGNOSIS — M7989 Other specified soft tissue disorders: Secondary | ICD-10-CM | POA: Diagnosis not present

## 2020-01-07 DIAGNOSIS — J471 Bronchiectasis with (acute) exacerbation: Secondary | ICD-10-CM | POA: Diagnosis not present

## 2020-01-07 MED ORDER — SUMATRIPTAN SUCCINATE 25 MG PO TABS: 25.0000 mg | ORAL_TABLET | Freq: Two times a day (BID) | ORAL | 0 refills | Status: DC | PRN

## 2020-01-07 MED ORDER — TAMSULOSIN HCL 0.4 MG PO CAPS: 0.4000 mg | ORAL_CAPSULE | Freq: Every day | ORAL | 3 refills | Status: DC

## 2020-01-07 MED ORDER — SUMATRIPTAN SUCCINATE 25 MG PO TABS: 25.0000 mg | ORAL_TABLET | ORAL | 0 refills | Status: DC | PRN

## 2020-01-07 MED ORDER — FUROSEMIDE 20 MG PO TABS: 20.0000 mg | ORAL_TABLET | Freq: Every day | ORAL | 0 refills | Status: AC

## 2020-01-07 MED ORDER — PREDNISONE 10 MG PO TABS: ORAL_TABLET | ORAL | 0 refills | Status: DC

## 2020-01-07 NOTE — Assessment & Plan Note (Addendum)
-   Given prescription for Imitrex 25mg  q 2 hours on discharge from hospital. Patient reports improvement with medication  - Will send in 1 weeks worth refill (PCP to manage after this)

## 2020-01-07 NOTE — Progress Notes (Signed)
@Patient  ID: Jeffrey Hines, male    DOB: 1945-07-28, 74 y.o.   MRN: [id number]  Chief Complaint  Patient presents with  . Hospitalization Follow-up    Referring provider: Clinic, Jeffrey Hines  HPI: 74 year old male, former smoker quit in 2016 (25 pack year hx). PMH significant for COPD, chronic respiratory failure with hypoxia, bronchiectasis, squamous cell carcinoma lung stage II, pneumonia d/t pseudomonas, malnutrition, restless leg syndrome. Patient of Dr. Carlis Hines, last seen on 08/14/19 for neck mass. Recently admitted for GIB and possible pneumonia. He is alternating antibiotics with Augmentin and Doxycycline. Maintained on Stiolto. He is on 3L oxygen.   Hospital admission 12/16/19-12/20/19: Patient presents with bright red blood in stool x 9 days. He is not on anticoagulation and has not had colonoscopy. Takes Ibuprofen 400mg  TID. Suspected lower GI bleed, symptoms resolved and hgb stable. He had a respiratory infection in June which was treated with course of Levaquin 500mg  qd x 1 week. He continued to have respiratory symptoms and low grade fever in July and was sent in Augmentin. CXR on 7/14 and repeated on 7/16 showed possible pneumonia, treated with Augmentin for 7 days. He was given lasix in the hospital that significantly helped with leg swelling.   Previous [facility] pulmonary encounter: 08/14/19- Dr. Carlis Hines Jeffrey Hines is a 74 y/o gentleman being seen in follow up for COPD, bronchiectasis, and history of positive AFB culture. He has a history of SCC in 2016 s/p LUL lobectomy with subsequent stenosis not amenable to dilation at [facility]. He has had recurrent pneumonias and bronchiectisas. He recently had an AFB culture return positive. He has chronic SOB, minimal sputum production, and mild cough. No signficiant whezing. He never uses rescue albuterol He can walk about 1 block before stopping. On maintenance Stiolto; anoro caused hives. Stiolto worsens chronic tinnitus and causes urinary  difficulties but improves his breathing. He has previously seen a urologist & audiologist at the [place] many years ago, but not recently. No fevers, chills, sweats, weight loss, fatigue, or change in appetite. He uses his flutter valve daily and hypertonic saline nebs occasionally. He is on a suppressive regimen per Jeffrey Hines of 1 week per month of antibiotics- Augmentin or doxycycline on alternating months, which his helped prevent pneumonias.   01/07/2020- Interim hx Patient presents today for hospital follow-up. He has a hx recurrent pneumonia and bronchiectasis. He is on alternating antibiotic Augmentin and doxycycline first week of the month. He saw Jeffrey Hines in December 2020, wife reports he went through a periof of time this spring where he really had no exacerbations. He developed respiratory infection in June which he has not fully recovered from. He is on Stiolto respimat 2 puffs once daily. He feels he experiences side effects from inhaler but it has considerably helped his breathing. He reports dry skin, back pain and leg swelling. He felt better while on lasix in the hospital, he notice decreased leg swelling and was moving better. [facility], results are not available so we will request them to review. He uses hypertonic saline nebulizer and flutter valve as prescribed. He tried therapy vest several times but did not tolerate this. He was prescribed imitrex in the hospital for headache, this helped and he needs refill. Advised we will give him 1 week worth of medication but he needs to follow-up with PCP for further management.    Imaging: 07/22/19 CT chest- 1. Postsurgical and post radiation change in the LEFT hemithorax.  No evidence of lung cancer recurrence. 2. No suspicious lesions in the RIGHT lung. 3. Peribronchial thickening and bronchiectasis with subpleural thickening unchanged from prior. 4. No lymphadenopathy  Allergies  Allergen Reactions  .  Anoro Ellipta [Umeclidinium-Vilanterol] Hives  . Prolixin [Fluphenazine]     Severe back pain  . Advair Diskus [Fluticasone-Salmeterol] Anxiety    Immunization History  Administered Date(s) Administered  . Influenza Inj Mdck Quad Pf 03/01/2018  . Influenza Split 04/14/2015  . Influenza Whole 02/27/2014  . Influenza, High Dose Seasonal PF 06/02/2016, 04/06/2017, 02/19/2019  . Moderna SARS-COVID-2 Vaccination 07/09/2019, 08/06/2019  . Pneumococcal Conjugate-13 04/14/2015  . Pneumococcal Polysaccharide-23 05/03/2016    Past Medical History:  Diagnosis Date  . Anxiety   . Cancer (Jeffrey Hines)   . Chronic lower back pain   . Closed head injury 1998  . Constipation due to opioid therapy   . COPD (chronic obstructive pulmonary disease) (Jeffrey Hines)   . COPD with chronic bronchitis ([place])   . Full dentures   . GERD (gastroesophageal reflux disease)   . Hemoptysis 08/03/2014  . Hypertension   . Multiple rib fractures 1998   left side  . On supplemental oxygen therapy    2L of O2 at night  . Post-obstruction pneumonia due to Pseudomonas aeruginosa 08/07/2014   Endobronchial mass causing LUL obstruction  . Radiation 08/25/14-09/29/14   left upper central lung 45 Gy  . Seizures (Jeffrey Hines)   . Shortness of breath dyspnea   . Shoulder dislocation 1998   left  . Spontaneous pneumothorax 08/03/2014   Left side 1st time episode spontaneous pneumothorax associated with acute flare of COPD   . Tobacco abuse     Tobacco History: Social History   Tobacco Use  Smoking Status Former Smoker  . Packs/day: 0.50  . Years: 50.00  . Pack years: 25.00  . Types: Cigarettes  . Quit date: 08/03/2014  . Years since quitting: 5.4  Smokeless Tobacco Never Used   Counseling given: Not Answered   Outpatient Medications Prior to Visit  Medication Sig Dispense Refill  . albuterol (PROVENTIL) (2.5 MG/3ML) 0.083% nebulizer solution Take 3 mLs (2.5 mg total) by nebulization every 2 (two) hours as needed for wheezing or  shortness of breath. 75 mL 12  . Bioflavonoid Products (BIOFLEX PO) Take 1 tablet by mouth 2 (two) times daily.     . clonazePAM (KLONOPIN) 0.5 MG tablet Take 0.5 mg by mouth 2 (two) times daily.     Marland Kitchen guaiFENesin (MUCINEX) 600 MG 12 hr tablet Take 1 tablet (600 mg total) by mouth 2 (two) times daily. 60 tablet 0  . lactobacillus acidophilus (BACID) TABS tablet Take 2 tablets by mouth 3 (three) times daily.    Marland Kitchen loratadine (CLARITIN) 10 MG tablet Take 10 mg by mouth in the morning and at bedtime.     . polyethylene glycol (MIRALAX / GLYCOLAX) 17 g packet Take 17 g by mouth 2 (two) times daily. 14 each 0  . Probiotic Product (PROBIOTIC-10 PO) Take by mouth at bedtime.    Marland Kitchen Respiratory Therapy Supplies (FLUTTER) DEVI 1 Device by Does not apply route as directed. 1 each 0  . rOPINIRole (REQUIP) 0.5 MG tablet Take 1 tablet (0.5 mg total) by mouth at bedtime. 90 tablet 0  . Saw Palmetto 450 MG CAPS Take 3 capsules by mouth in the morning and at bedtime. Take 3 capsules in the morning and Take 1 capsule at bedtime    . sodium chloride 0.9 % nebulizer solution SMARTSIG:Via  Inhaler    . Tiotropium Bromide-Olodaterol (STIOLTO RESPIMAT) 2.5-2.5 MCG/ACT AERS Inhale 1 puff into the lungs daily.     . Turmeric (QC TUMERIC COMPLEX) 500 MG CAPS Take by mouth in the morning and at bedtime.    . SUMAtriptan (IMITREX) 25 MG tablet Take 1 tablet (25 mg total) by mouth every 2 (two) hours as needed for migraine or headache. May repeat in 2 hours if headache persists or recurs. 10 tablet 0  . tamsulosin (FLOMAX) 0.4 MG CAPS capsule Take 1 capsule (0.4 mg total) by mouth daily. 30 capsule 3  . amoxicillin-clavulanate (AUGMENTIN) 875-125 MG tablet Take 1 tablet by mouth 2 (two) times daily. 14 tablet 0  . mometasone-formoterol (DULERA) 100-5 MCG/ACT AERO Inhale 2 puffs into the lungs 2 (two) times daily. (Patient not taking: Reported on 01/07/2020) 13 g 0  . pantoprazole (PROTONIX) 40 MG tablet Take 1 tablet (40 mg total)  by mouth 2 (two) times daily. (Patient not taking: Reported on 01/07/2020) 60 tablet 3  . sodium chloride HYPERTONIC 3 % nebulizer solution Take by nebulization 2 (two) times daily. (Patient not taking: Reported on 12/16/2019) 300 mL 12   No facility-administered medications prior to visit.    Review of Systems  Review of Systems  Respiratory: Positive for cough and shortness of breath.     Physical Exam  BP 122/88 (BP Location: Left Arm, Cuff Size: Normal)   Pulse (!) 111   Temp (!) 97.3 F (36.3 C) (Oral)   Ht 5\' 11"  (1.803 m)   Wt 173 lb 9.6 oz (78.7 kg)   SpO2 90%   BMI 24.21 kg/m  Physical Exam Constitutional:      Appearance: Normal appearance.  HENT:     Head: Normocephalic and atraumatic.     Mouth/Throat:     Mouth: Mucous membranes are moist.     Pharynx: Oropharynx is clear.  Cardiovascular:     Rate and Rhythm: Normal rate and regular rhythm.  Pulmonary:     Breath sounds: Rhonchi present.     Comments: Rhonchi throughout lung fields  Neurological:     General: No focal deficit present.     Mental Status: He is alert and oriented to person, place, and time. Mental status is at baseline.  Psychiatric:        Mood and Affect: Mood normal.        Behavior: Behavior normal.        Thought Content: Thought content normal.        Judgment: Judgment normal.      Lab Results:  CBC    Component Value Date/Time   WBC 12.1 (H) 12/20/2019 0421   RBC 4.12 (L) 12/20/2019 0421   HGB 11.8 (L) 12/20/2019 0421   HGB 13.7 07/22/2019 0901   HGB 12.9 (L) 02/08/2017 1343   HCT 36.4 (L) 12/20/2019 0421   HCT 38.9 02/08/2017 1343   PLT 267 12/20/2019 0421   PLT 234 07/22/2019 0901   PLT 218 02/08/2017 1343   MCV 88.3 12/20/2019 0421   MCV 85.7 02/08/2017 1343   MCH 28.6 12/20/2019 0421   MCHC 32.4 12/20/2019 0421   RDW 13.6 12/20/2019 0421   RDW 12.8 02/08/2017 1343   LYMPHSABS 0.8 12/20/2019 0421   LYMPHSABS 0.9 02/08/2017 1343   MONOABS 1.4 (H) 12/20/2019 0421    MONOABS 0.5 02/08/2017 1343   EOSABS 0.1 12/20/2019 0421   EOSABS 0.1 02/08/2017 1343   BASOSABS 0.0 12/20/2019 0421   BASOSABS 0.0  02/08/2017 1343    BMET    Component Value Date/Time   NA 137 12/20/2019 0421   NA 142 02/08/2017 1344   K 4.3 12/20/2019 0421   K 3.5 02/08/2017 1344   CL 99 12/20/2019 0421   CO2 24 12/20/2019 0421   CO2 28 02/08/2017 1344   GLUCOSE 127 (H) 12/20/2019 0421   GLUCOSE 113 02/08/2017 1344   BUN 22 12/20/2019 0421   BUN 13.5 02/08/2017 1344   CREATININE 0.82 12/20/2019 0421   CREATININE 0.92 07/22/2019 0901   CREATININE 0.9 02/08/2017 1344   CALCIUM 8.4 (L) 12/20/2019 0421   CALCIUM 9.0 02/08/2017 1344   GFRNONAA >60 12/20/2019 0421   GFRNONAA >60 07/22/2019 0901   GFRAA >60 12/20/2019 0421   GFRAA >60 07/22/2019 0901    BNP    Component Value Date/Time   BNP 30.5 08/03/2014 1359    ProBNP No results found for: PROBNP  Imaging: DG Chest 2 View  Result Date: 12/20/2019 CLINICAL DATA:  74 year old male with a history of pneumonia, shortness of breath EXAM: CHEST - 2 VIEW COMPARISON:  12/18/2019, 12/06/2019 FINDINGS: Similar rightward shift of the mediastinum and the tracheal air column. Right heart border and left heart borders are not well visualized. Persisting opacity in the upper left lung and lower left lung with minimal residual aeration in the mid lung. Coarsened interstitial markings through the right lung. No displaced fracture. Left chest wall deformity again demonstrated. Degenerative changes of the spine. IMPRESSION: Similar appearance of the chest x-ray to the most recent prior, with minimal aeration in the left lung and leftward shift of the mediastinum compatible with volume loss, chronic changes/surgical changes, and suspected airway obstruction/pneumonia, possibly aspiration. Electronically Signed   By: Corrie Mckusick D.O.   On: 12/20/2019 09:53   DG Chest 2 View  Result Date: 12/18/2019 CLINICAL DATA:  Pneumonia. EXAM:  CHEST - 2 VIEW COMPARISON:  12/06/2019 FINDINGS: AP and lateral views of the chest show interval development of diffuse airspace opacity in the residual left lung, finding new in the interval. Persistent volume loss left hemithorax is stable to slightly progressed in the interval. Right lung hyperexpanded without focal consolidation, pneumothorax, or pleural effusion. The visualized bony structures of the thorax show now acute abnormality. Telemetry leads overlie the chest. IMPRESSION: Residual aerated left lung seen on the previous study is now almost completely opacified compatible with collapse/consolidation. The degree of volume loss in the left hemithorax does not appear substantially progressed in the interval suggesting that the opacity today is likely related to airspace disease such as pneumonia. Electronically Signed   By: Misty Stanley M.D.   On: 12/18/2019 10:24     Assessment & Plan:   Bronchiectasis with (acute) exacerbation ([place]) - Patient had respiratory infection in June treated with Levaquin and Augmentin. He does not feel fully recovered. He has an upcoming repeat CT chest later this month. Unable to use therapy vest as he did not tolerate this. He reports improvement with prednisone in the past, he has not been on steroid in several months.  - Plan continue hypertonic saline nebulizer TWICE daily, flutter valve THREE times a day AND mucinex 800mg  twice daily - Continue alternating antibiotics Doxycycline AND Augmentin 1st week opposite months as prescribed  - Sending in RX for prednisone taper (40mg  x 3 days; 30mg  x 3 days; 20mg  x 3 days; 10mg  x 3 days)  Leg swelling - Reports leg swell, improved with diuretics in the hospital  - Checking BNP  and BMET today  - Please request echocardiogram results from [place] in /Dr. Domenica Fail - RX Lasix 20mg  qd prn leg swelling (PCP to manage after)   Headache - Given prescription for Imitrex 25mg  q 2 hours on discharge from hospital.  Patient reports improvement with medication  - Will send in 1 weeks worth refill (PCP to manage after this)  FU in 4-6 weeks with Dr. Myrtie Hawk, NP 01/07/2020

## 2020-01-07 NOTE — Assessment & Plan Note (Signed)
-   Patient had respiratory infection in June treated with Levaquin and Augmentin. He does not feel fully recovered. He has an upcoming repeat CT chest later this month. Unable to use therapy vest as he did not tolerate this. He reports improvement with prednisone in the past, he has not been on steroid in several months.  - Plan continue hypertonic saline nebulizer TWICE daily, flutter valve THREE times a day AND mucinex 800mg  twice daily - Continue alternating antibiotics Doxycycline AND Augmentin 1st week opposite months as prescribed  - Sending in RX for prednisone taper (40mg  x 3 days; 30mg  x 3 days; 20mg  x 3 days; 10mg  x 3 days)

## 2020-01-07 NOTE — Patient Instructions (Addendum)
Orders: - Labs today (ordered) - Please request echocardiogram results from [facility] Dr. Domenica Fail  Recommendations: - Use hypertonic saline nebulizer TWICE daily  - Use flutter valve THRRE times a day  - Continue mucinex 400mg - take TWO tablets twice daily - Continue alternating antibiotics Doxycycline 1st week a month and Augmentin 1st week opposite month  Rx: - Lasix 20mg  once daily as needed for leg swelling (PCP to manage after this refill) - Imitrex 25mg  twice daily as needed for headache (PCP to manage after this refill)  Follow-up: - Already scheduled for repeat CT chest later this month  - Follow up 4-6 weeks with Dr. Loletta Specter only OR first available with her

## 2020-01-07 NOTE — Assessment & Plan Note (Addendum)
-   Reports leg swell, improved with diuretics in the hospital  - Checking BNP and BMET today  - Please request echocardiogram results from [place] in [place]/Dr. Domenica Fail - RX Lasix 20mg  qd prn leg swelling (PCP to manage after)

## 2020-01-08 LAB — BASIC METABOLIC PANEL
BUN: 18 mg/dL (ref 6–23)
CO2: 32 mEq/L (ref 19–32)
Calcium: 9.1 mg/dL (ref 8.4–10.5)
Chloride: 103 mEq/L (ref 96–112)
Creatinine, Ser: 0.9 mg/dL (ref 0.40–1.50)
GFR: 82.61 mL/min (ref >60.00)
Glucose, Bld: 106 mg/dL — ABNORMAL HIGH (ref 70–99)
Potassium: 4.2 mEq/L (ref 3.5–5.1)
Sodium: 140 mEq/L (ref 135–145)

## 2020-01-08 LAB — BRAIN NATRIURETIC PEPTIDE: Pro B Natriuretic peptide (BNP): 101 pg/mL — ABNORMAL HIGH (ref 0.0–100.0)

## 2020-01-08 NOTE — Progress Notes (Addendum)
Please let patient know BNP just slightly elevated, ok to take lasix 20mg  daily as needed for leg swelling that I prescribed. If he needs refill will need to follow up with PCP. Kidney function was fine. [facility] on [facility]  01/07/2020 -echocardiogram received from [place] showing EF 55 to 56%, grade 1 diastolic dysfunction.  Trivial pericardial effusion.  Right ventricle normal in size and function.

## 2020-01-08 NOTE — Progress Notes (Signed)
Called and left message on voicemail to please call back.

## 2020-01-17 ENCOUNTER — Inpatient Hospital Stay: Payer: Medicare Other | Attending: Internal Medicine

## 2020-01-17 ENCOUNTER — Ambulatory Visit (HOSPITAL_COMMUNITY)
Admission: RE | Admit: 2020-01-17 | Discharge: 2020-01-17 | Disposition: A | Discharge status: Home / Self Care | Payer: Medicare Other | Source: Ambulatory Visit | Attending: Internal Medicine | Admitting: Internal Medicine

## 2020-01-17 ENCOUNTER — Other Ambulatory Visit: Payer: Self-pay

## 2020-01-17 DIAGNOSIS — R911 Solitary pulmonary nodule: Secondary | ICD-10-CM | POA: Insufficient documentation

## 2020-01-17 DIAGNOSIS — C3490 Malignant neoplasm of unspecified part of unspecified bronchus or lung: Secondary | ICD-10-CM | POA: Insufficient documentation

## 2020-01-17 DIAGNOSIS — R918 Other nonspecific abnormal finding of lung field: Secondary | ICD-10-CM | POA: Diagnosis not present

## 2020-01-17 DIAGNOSIS — I700 Atherosclerosis of aorta: Secondary | ICD-10-CM | POA: Diagnosis not present

## 2020-01-17 DIAGNOSIS — Z85118 Personal history of other malignant neoplasm of bronchus and lung: Secondary | ICD-10-CM | POA: Insufficient documentation

## 2020-01-17 DIAGNOSIS — J449 Chronic obstructive pulmonary disease, unspecified: Secondary | ICD-10-CM | POA: Insufficient documentation

## 2020-01-17 DIAGNOSIS — M954 Acquired deformity of chest and rib: Secondary | ICD-10-CM | POA: Diagnosis not present

## 2020-01-17 LAB — CBC WITH DIFFERENTIAL (CANCER CENTER ONLY)
Abs Immature Granulocytes: 0.06 10*3/uL (ref 0.00–0.07)
Basophils Absolute: 0 10*3/uL (ref 0.0–0.1)
Basophils Relative: 0 %
Eosinophils Absolute: 0.1 10*3/uL (ref 0.0–0.5)
Eosinophils Relative: 1 %
HCT: 43.6 % (ref 39.0–52.0)
Hemoglobin: 13.6 g/dL (ref 13.0–17.0)
Immature Granulocytes: 1 %
Lymphocytes Relative: 6 %
Lymphs Abs: 0.7 10*3/uL (ref 0.7–4.0)
MCH: 27.4 pg (ref 26.0–34.0)
MCHC: 31.2 g/dL (ref 30.0–36.0)
MCV: 87.9 fL (ref 80.0–100.0)
Monocytes Absolute: 1 10*3/uL (ref 0.1–1.0)
Monocytes Relative: 8 %
Neutro Abs: 9.9 10*3/uL — ABNORMAL HIGH (ref 1.7–7.7)
Neutrophils Relative %: 84 %
Platelet Count: 310 10*3/uL (ref 150–400)
RBC: 4.96 MIL/uL (ref 4.22–5.81)
RDW: 13.5 % (ref 11.5–15.5)
WBC Count: 11.7 10*3/uL — ABNORMAL HIGH (ref 4.0–10.5)
nRBC: 0 % (ref 0.0–0.2)

## 2020-01-17 LAB — CMP (CANCER CENTER ONLY)
ALT: 14 U/L (ref 0–44)
AST: 13 U/L — ABNORMAL LOW (ref 15–41)
Albumin: 3.8 g/dL (ref 3.5–5.0)
Alkaline Phosphatase: 75 U/L (ref 38–126)
Anion gap: 12 (ref 5–15)
BUN: 21 mg/dL (ref 8–23)
CO2: 28 mmol/L (ref 22–32)
Calcium: 9.7 mg/dL (ref 8.9–10.3)
Chloride: 101 mmol/L (ref 98–111)
Creatinine: 1.2 mg/dL (ref 0.61–1.24)
GFR, Est AFR Am: >60 mL/min (ref >60)
GFR, Estimated: 60 mL/min — ABNORMAL LOW (ref >60)
Glucose, Bld: 136 mg/dL — ABNORMAL HIGH (ref 70–99)
Potassium: 4.3 mmol/L (ref 3.5–5.1)
Sodium: 141 mmol/L (ref 135–145)
Total Bilirubin: 0.4 mg/dL (ref 0.3–1.2)
Total Protein: 8 g/dL (ref 6.5–8.1)

## 2020-01-17 MED ORDER — SODIUM CHLORIDE (PF) 0.9 % IJ SOLN
INTRAMUSCULAR | Status: AC
  Filled 2020-01-17: qty 50

## 2020-01-17 MED ORDER — IOHEXOL 300 MG/ML  SOLN
75.0000 mL | Freq: Once | INTRAMUSCULAR | Status: AC | PRN
  Administered 2020-01-17: 75 mL via INTRAVENOUS

## 2020-01-20 ENCOUNTER — Inpatient Hospital Stay (HOSPITAL_BASED_OUTPATIENT_CLINIC_OR_DEPARTMENT_OTHER): Payer: Medicare Other | Admitting: Internal Medicine

## 2020-01-20 ENCOUNTER — Other Ambulatory Visit: Payer: Self-pay

## 2020-01-20 ENCOUNTER — Telehealth: Payer: Self-pay | Admitting: Internal Medicine

## 2020-01-20 ENCOUNTER — Encounter: Payer: Self-pay | Admitting: Internal Medicine

## 2020-01-20 DIAGNOSIS — Z85118 Personal history of other malignant neoplasm of bronchus and lung: Secondary | ICD-10-CM | POA: Diagnosis not present

## 2020-01-20 DIAGNOSIS — C3490 Malignant neoplasm of unspecified part of unspecified bronchus or lung: Secondary | ICD-10-CM

## 2020-01-20 DIAGNOSIS — J449 Chronic obstructive pulmonary disease, unspecified: Secondary | ICD-10-CM | POA: Diagnosis not present

## 2020-01-20 DIAGNOSIS — R911 Solitary pulmonary nodule: Secondary | ICD-10-CM | POA: Diagnosis not present

## 2020-01-20 NOTE — Telephone Encounter (Signed)
Scheduled appointment per 8/16 los. Patient received updated calendar at time of service.
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
## 2020-01-24 ENCOUNTER — Other Ambulatory Visit: Payer: Self-pay | Admitting: *Deleted

## 2020-01-24 MED ORDER — ROPINIROLE HCL 0.5 MG PO TABS: 0.5000 mg | ORAL_TABLET | Freq: Every day | ORAL | 0 refills | Status: DC

## 2020-01-29 ENCOUNTER — Other Ambulatory Visit: Payer: Self-pay | Admitting: Primary Care

## 2020-02-02 ENCOUNTER — Other Ambulatory Visit: Payer: Self-pay | Admitting: Primary Care

## 2020-02-07 ENCOUNTER — Telehealth: Payer: Self-pay | Admitting: Critical Care Medicine

## 2020-02-07 NOTE — Telephone Encounter (Signed)
Dr Carlis Abbott- pt seen by [facility] on 01/07/20 and advised to f/u 4-6 wks with you however there are not any open appts  Please advise if you are okay with seeing APP again Thanks

## 2020-02-07 NOTE — Telephone Encounter (Signed)
LMTCB for pt 

## 2020-02-07 NOTE — Telephone Encounter (Signed)
That's fine

## 2020-02-11 NOTE — Telephone Encounter (Signed)
Pt returning call and scheduled for next Thurs. 9/16.

## 2020-02-11 NOTE — Telephone Encounter (Signed)
Patient has a office visit on 9/16 with [name]. Will close the encounter. Nothing else further needed.

## 2020-02-11 NOTE — Telephone Encounter (Signed)
lmtcb for pt. Pt needs OV with Dr. Carlis Abbott or APP around a week.

## 2020-02-20 ENCOUNTER — Ambulatory Visit: Payer: Medicare Other | Admitting: Pulmonary Disease

## 2020-02-21 ENCOUNTER — Other Ambulatory Visit: Payer: Self-pay | Admitting: Pulmonary Disease

## 2020-02-21 ENCOUNTER — Encounter: Payer: Self-pay | Admitting: Pulmonary Disease

## 2020-02-21 ENCOUNTER — Ambulatory Visit (INDEPENDENT_AMBULATORY_CARE_PROVIDER_SITE_OTHER): Payer: Medicare Other | Admitting: Pulmonary Disease

## 2020-02-21 ENCOUNTER — Other Ambulatory Visit: Payer: Self-pay

## 2020-02-21 VITALS — BP 118/68 | HR 110 | Temp 98.4°F | Ht 71.0 in | Wt 168.6 lb

## 2020-02-21 DIAGNOSIS — J189 Pneumonia, unspecified organism: Secondary | ICD-10-CM

## 2020-02-21 DIAGNOSIS — J9611 Chronic respiratory failure with hypoxia: Secondary | ICD-10-CM

## 2020-02-21 DIAGNOSIS — J4489 Other specified chronic obstructive pulmonary disease: Secondary | ICD-10-CM

## 2020-02-21 DIAGNOSIS — J449 Chronic obstructive pulmonary disease, unspecified: Secondary | ICD-10-CM | POA: Diagnosis not present

## 2020-02-21 DIAGNOSIS — J479 Bronchiectasis, uncomplicated: Secondary | ICD-10-CM

## 2020-02-21 MED ORDER — DOXYCYCLINE HYCLATE 100 MG PO TABS: ORAL_TABLET | ORAL | 1 refills | Status: DC

## 2020-02-21 MED ORDER — AMOXICILLIN-POT CLAVULANATE 875-125 MG PO TABS
1.0000 | ORAL_TABLET | Freq: Two times a day (BID) | ORAL | 0 refills | Status: DC
Start: 2020-02-21 — End: 2020-04-24

## 2020-02-21 NOTE — Assessment & Plan Note (Signed)
Plan: Encourage patient to resume flutter valve use -3 times daily Continue hypertonic saline nebs twice daily Continue monthly suppressive antibiotics: Next month should be 1 week of doxycycline the first week of October, 1 week of Augmentin the first week in November, and rotate Establish care with Dr. Silas Flood and 30-minute time slot

## 2020-02-21 NOTE — Assessment & Plan Note (Signed)
Plan: Continue oxygen therapy 

## 2020-02-21 NOTE — Assessment & Plan Note (Signed)
Plan: Continue Stiolto Establish care with Dr. Silas Flood and 30-minute time slot

## 2020-02-21 NOTE — Patient Instructions (Addendum)
You were seen today by Lauraine Rinne, NP  for:   1. Bronchiectasis without complication ([place])  Continue your rotating suppressive antibiotics  Next month in October/2021 the first week of October you should take doxycycline:  Doxycycline >>> 1 100 mg tablet every 12 hours for 7 days >>>take with food  >>>wear sunscreen   The following month November/2021 you should take Augmentin the first week:  Augmentin >>> Take 1 875-125 mg tablet every 12 hours for the next 7 days >>> Take with food  Bronchiectasis: This is the medical term which indicates that you have damage, dilated airways making you more susceptible to respiratory infection. Use a flutter valve 10 breaths three times a day or 4 to 5 breaths 5-6 times a day to help clear mucus out Let us know if you have cough with change in mucus color or fevers or chills.  At that point you would need an antibiotic. Maintain a healthy nutritious diet, eating whole foods Take your medications as prescribed   Continue hypertonic saline nebs  2. Chronic respiratory failure with hypoxia ([facility])  Continue oxygen therapy as prescribed  >>>maintain oxygen saturations greater than 88 percent  >>>if unable to maintain oxygen saturations please contact the office  >>>do not smoke with oxygen  >>>can use nasal saline gel or nasal saline rinses to moisturize nose if oxygen causes dryness   3. COPD with chronic bronchitis ([facility])  Stiolto Respimat inhaler >>>2 puffs daily >>>Take this no matter what >>>This is not a rescue inhaler  Note your daily symptoms > remember "red flags" for COPD:   >>>Increase in cough >>>increase in sputum production >>>increase in shortness of breath or activity  intolerance.   If you notice these symptoms, please call the office to be seen.   4.  Abnormal diagnostic imaging of lung  Repeat CT scan of chest in November/2021   Follow Up:    Return in about 6 weeks (around 04/03/2020), or if symptoms worsen  or fail to improve, for Follow up with Dr. Silas Flood, [place].   Notification of test results are managed in the following manner: If there are  any recommendations or changes to the  plan of care discussed in office today,  we will contact you and let you know what they are. If you do not hear from [name], then your results are normal and you can view them through your  MyChart account , or a letter will be sent to you. Thank you again for trusting [name] with your care  - Thank you, [facility]    It is flu season:   >>> Best ways to protect herself from the flu: Receive the yearly flu vaccine, practice good hand hygiene washing with soap and also using hand sanitizer when available, eat a nutritious meals, get adequate rest, hydrate appropriately       Please contact the office if your symptoms worsen or you have concerns that you are not improving.   Thank you for choosing [facility] for your healthcare, and for allowing [name] to partner with you on your healthcare journey. I am thankful to be able to provide care to you today.   [name] FNP-C

## 2020-02-21 NOTE — Progress Notes (Signed)
@Patient  ID: Jeffrey Hines, male    DOB: 1946-01-15, 74 y.o.   MRN: [id number]  Chief Complaint  Patient presents with  . Follow-up    pt iis here for copd has not been wearing o2     Referring provider: [facility], [facility]  HPI:  74 year old male former smoker on our office for bronchiectasis, COPD, history of Pseudomonas, squamous cell carcinoma lung stage II  PMH: Hypertension, anxiety, malnutrition, restless leg syndrome Smoker/ Smoking History: Former smoker.  Quit 2016. Maintenance: Stiolto Respimat, monthly antibiotics (rotating doxycycline, Augmentin) Pt of: Dr. Carlis Abbott  02/21/2020  - Visit   74 year old male former smoker followed in our office for COPD.  He is followed by Dr. Valeta Harms.  He is up-to-date on COVID-19 vaccinations.  He was last seen in our office on 01/07/2020 by EW NP this was a hospital follow-up.  Plan of care at that visit was as follows patient was encouraged to remain on hypertonic saline nebs twice daily and flutter valve 3 times a day Mucinex 800 mg twice daily.  He is also on monthly antibiotics doxycycline and Augmentin first week of opposite months as prescribed.  Patient presenting to office today as a follow-up.  He reports that he is adherent to using his hypertonic saline nebs twice daily.  Unfortunately he does not use his flutter valve regularly.  He admits to may be using his flutter valve 2-3 times a week.  Patient forgot to use his Stiolto Respimat today but reports that he typically is adherent to using this.  He feels this is helpful and he is able to cough up mucus when using Stiolto Respimat.  Patient is planning on obtaining a repeat CT of his chest in November/2021, this is already been ordered by Dr. Lew Dawes office.  On arrival to our office patient had oxygen off and oxygen saturations were at 89%.  He was encouraged to place his oxygen back on.  Oxygen levels came up to above 95% when on oxygen.  Patient completed 1 week of  Augmentin at the beginning of this month.  He plans to complete 1 week of doxycycline at the beginning of next month.  This is scheduled antibiotics to help with management of his bronchiectatic flares.  He reports that he is already received his seasonal flu vaccine at the [place].  He is up-to-date with his COVID-19 vaccinations.  Questionaires / Pulmonary Flowsheets:   ACT:  No flowsheet data found.  MMRC: No flowsheet data found.  Epworth:  No flowsheet data found.  Tests:   01/18/2020-CT chest with contrast-interval development of patchy consolidative opacities within the subpleural right lower lobe which may represent infectious or inflammatory process, recommend attention on follow-up as recurrent metastatic disease is not entirely excluded, interval increase in size and 9 mm irregular nodule within the right middle lobe, recommending CT chest follow-up in 3 months, prior left lobectomy   FENO:  No results found for: NITRICOXIDE  PFT: PFT Results Latest Ref Rng & Units 11/07/2014 08/07/2014  FVC-Pre L 4.89 2.93  FVC-Predicted Pre % 104 62  FVC-Post L 5.38 3.44  FVC-Predicted Post % 115 73  Pre FEV1/FVC % % 46 49  Post FEV1/FCV % % 46 50  FEV1-Pre L 2.24 1.44  FEV1-Predicted Pre % 64 41  FEV1-Post L 2.47 1.71  DLCO uncorrected ml/min/mmHg 10.62 11.13  DLCO UNC% % 31 33  DLCO corrected ml/min/mmHg 11.70 11.29  DLCO COR %Predicted % 34 33  DLVA Predicted % 40 55  TLC L 7.79 6.73  TLC % Predicted % 107 93  RV % Predicted % 101 131    WALK:  SIX MIN WALK 04/28/2015 10/28/2014  Supplimental Oxygen during Test? (L/min) No No  Tech Comments: - pt walked a slow pace, tolerated walk well.     Imaging: No results found.  Lab Results:  CBC    Component Value Date/Time   WBC 11.7 (H) 01/17/2020 1355   WBC 12.1 (H) 12/20/2019 0421   RBC 4.96 01/17/2020 1355   HGB 13.6 01/17/2020 1355   HGB 12.9 (L) 02/08/2017 1343   HCT 43.6 01/17/2020 1355   HCT 38.9 02/08/2017 1343    PLT 310 01/17/2020 1355   PLT 218 02/08/2017 1343   MCV 87.9 01/17/2020 1355   MCV 85.7 02/08/2017 1343   MCH 27.4 01/17/2020 1355   MCHC 31.2 01/17/2020 1355   RDW 13.5 01/17/2020 1355   RDW 12.8 02/08/2017 1343   LYMPHSABS 0.7 01/17/2020 1355   LYMPHSABS 0.9 02/08/2017 1343   MONOABS 1.0 01/17/2020 1355   MONOABS 0.5 02/08/2017 1343   EOSABS 0.1 01/17/2020 1355   EOSABS 0.1 02/08/2017 1343   BASOSABS 0.0 01/17/2020 1355   BASOSABS 0.0 02/08/2017 1343    BMET    Component Value Date/Time   NA 141 01/17/2020 1355   NA 142 02/08/2017 1344   K 4.3 01/17/2020 1355   K 3.5 02/08/2017 1344   CL 101 01/17/2020 1355   CO2 28 01/17/2020 1355   CO2 28 02/08/2017 1344   GLUCOSE 136 (H) 01/17/2020 1355   GLUCOSE 113 02/08/2017 1344   BUN 21 01/17/2020 1355   BUN 13.5 02/08/2017 1344   CREATININE 1.20 01/17/2020 1355   CREATININE 0.9 02/08/2017 1344   CALCIUM 9.7 01/17/2020 1355   CALCIUM 9.0 02/08/2017 1344   GFRNONAA 60 (L) 01/17/2020 1355   GFRAA >60 01/17/2020 1355    BNP    Component Value Date/Time   BNP 30.5 08/03/2014 1359    ProBNP    Component Value Date/Time   PROBNP 101.0 (H) 01/07/2020 1641    Specialty Problems      Pulmonary Problems   COPD with chronic bronchitis ([place])    March 2016 pulmonary function test> ratio 50%, FEV1 L (49% predicted, 18% change with bronchodilator), total lung capacity 6.73 L (93% predicted), DLCO 11.13 (33% predicted). June 2016 pulmonary function testing ratio 46%, FEV1 2.47 L (71% Pred), total lung capacity 7.79 L (107% predicted), DLCO 10.62 (31% predicted). 11/2014 ONO < 88% 2 hours      Lung mass   Post-obstruction pneumonia due to Pseudomonas aeruginosa    Endobronchial mass causing LUL obstruction      Squamous cell carcinoma of lung, stage II ([place])    - Status post left upper lobectomy summer 2016 - seen at [facility] and underwent a bronchoscopy on March 16 2017 with a balloon dilation of the focal area of  acquired bronchomalacia and stenosis of the left lower lobe orifice this was felt due to torsion of the lung post lobectomy       Chronic respiratory failure with hypoxia (HCC)    2L O2 qHS 05/2015 ONO RA> less than 88% 1 hour, 31 minutes, lowest 77%      Lung cancer (HCC)   Cough   Community acquired pneumonia of left lung   Hypoxia   Pneumonia due to Pseudomonas species ([place])   Postobstructive pneumonia   Bronchiectasis with (acute) exacerbation (HCC)   Bronchiectasis without complication (  [place])      Allergies  Allergen Reactions  . Anoro Ellipta [Umeclidinium-Vilanterol] Hives  . Prolixin [Fluphenazine]     Severe back pain  . Advair Diskus [Fluticasone-Salmeterol] Anxiety    Immunization History  Administered Date(s) Administered  . Influenza Inj Mdck Quad Pf 03/01/2018  . Influenza Split 04/14/2015  . Influenza Whole 02/27/2014  . Influenza, High Dose Seasonal PF 06/02/2016, 04/06/2017, 02/19/2019  . Influenza-Unspecified 12/27/2019  . Moderna SARS-COVID-2 Vaccination 07/09/2019, 08/06/2019  . Pneumococcal Conjugate-13 04/14/2015  . Pneumococcal Polysaccharide-23 05/03/2016    Past Medical History:  Diagnosis Date  . Anxiety   . Cancer ([place])   . Chronic lower back pain   . Closed head injury 1998  . Constipation due to opioid therapy   . COPD (chronic obstructive pulmonary disease) ([place])   . COPD with chronic bronchitis ([place])   . Full dentures   . GERD (gastroesophageal reflux disease)   . Hemoptysis 08/03/2014  . Hypertension   . Multiple rib fractures 1998   left side  . On supplemental oxygen therapy    2L of O2 at night  . Post-obstruction pneumonia due to Pseudomonas aeruginosa 08/07/2014   Endobronchial mass causing LUL obstruction  . Radiation 08/25/14-09/29/14   left upper central lung 45 Gy  . Seizures ([place])   . Shortness of breath dyspnea   . Shoulder dislocation 1998   left  . Spontaneous pneumothorax 08/03/2014   Left side 1st time episode  spontaneous pneumothorax associated with acute flare of COPD   . Tobacco abuse     Tobacco History: Social History   Tobacco Use  Smoking Status Former Smoker  . Packs/day: 0.50  . Years: 50.00  . Pack years: 25.00  . Types: Cigarettes  . Quit date: 08/03/2014  . Years since quitting: 5.5  Smokeless Tobacco Never Used   Counseling given: Yes   Continue to not smoke  Outpatient Encounter Medications as of 02/21/2020  Medication Sig  . albuterol (PROVENTIL) (2.5 MG/3ML) 0.083% nebulizer solution Take 3 mLs (2.5 mg total) by nebulization every 2 (two) hours as needed for wheezing or shortness of breath.  Marland Kitchen Bioflavonoid Products (BIOFLEX PO) Take 1 tablet by mouth 2 (two) times daily.   . clonazePAM (KLONOPIN) 0.5 MG tablet Take 0.5 mg by mouth 2 (two) times daily.   . furosemide (LASIX) 20 MG tablet Take 1 tablet (20 mg total) by mouth daily.  Marland Kitchen guaiFENesin (MUCINEX) 600 MG 12 hr tablet Take 1 tablet (600 mg total) by mouth 2 (two) times daily.  Marland Kitchen lactobacillus acidophilus (BACID) TABS tablet Take 2 tablets by mouth 3 (three) times daily.  Marland Kitchen loratadine (CLARITIN) 10 MG tablet Take 10 mg by mouth in the morning and at bedtime.   . polyethylene glycol (MIRALAX / GLYCOLAX) 17 g packet Take 17 g by mouth 2 (two) times daily.  . Probiotic Product (PROBIOTIC-10 PO) Take by mouth at bedtime.  Marland Kitchen Respiratory Therapy Supplies (FLUTTER) DEVI 1 Device by Does not apply route as directed.  Marland Kitchen rOPINIRole (REQUIP) 0.5 MG tablet Take 1 tablet (0.5 mg total) by mouth at bedtime.  . Saw Palmetto 450 MG CAPS Take 3 capsules by mouth in the morning and at bedtime. Take 3 capsules in the morning and Take 1 capsule at bedtime  . sodium chloride 0.9 % nebulizer solution SMARTSIG:Via Inhaler  . sodium chloride HYPERTONIC 3 % nebulizer solution SMARTSIG:Via Inhaler  . SUMAtriptan (IMITREX) 25 MG tablet Take 1 tablet (25 mg total) by mouth 2 (  two) times daily as needed for migraine or headache. May repeat in 2  hours if headache persists or recurs.  . tamsulosin (FLOMAX) 0.4 MG CAPS capsule Take 1 capsule (0.4 mg total) by mouth daily.  . Tiotropium Bromide-Olodaterol (STIOLTO RESPIMAT) 2.5-2.5 MCG/ACT AERS Inhale 1 puff into the lungs daily.   . Turmeric (QC TUMERIC COMPLEX) 500 MG CAPS Take by mouth in the morning and at bedtime.  . [DISCONTINUED] doxycycline (VIBRA-TABS) 100 MG tablet TAKE 1 TABLET BY MOUTH TWICE A DAY FIRST 7 DAYS OF EVERY OTHER MONTH (MAR, MAY, JULY)  . [DISCONTINUED] predniSONE (DELTASONE) 10 MG tablet Take 4 tabs po daily x 3 days; then 3 tabs daily x3 days; then 2 tabs daily x3 days; then 1 tab daily x 3 days; then stop   No facility-administered encounter medications on file as of 02/21/2020.     Review of Systems  Review of Systems  Constitutional: Positive for fatigue. Negative for activity change, chills, fever and unexpected weight change.  HENT: Positive for congestion. Negative for postnasal drip, rhinorrhea, sinus pressure, sinus pain and sore throat.   Eyes: Negative.   Respiratory: Positive for cough and shortness of breath. Negative for wheezing.   Cardiovascular: Negative for chest pain and palpitations.  Gastrointestinal: Negative for constipation, diarrhea, nausea and vomiting.  Endocrine: Negative.   Genitourinary: Negative.   Musculoskeletal: Negative.   Skin: Negative.   Neurological: Negative for dizziness and headaches.  Psychiatric/Behavioral: Negative.  Negative for dysphoric mood. The patient is not nervous/anxious.   All other systems reviewed and are negative.    Physical Exam  BP 118/68 (BP Location: Left Arm, Cuff Size: Normal)   Pulse (!) 110   Temp 98.4 F (36.9 C) (Oral)   Ht 5\' 11"  (1.803 m)   Wt 168 lb 9.6 oz (76.5 kg)   SpO2 97%   BMI 23.51 kg/m   Wt Readings from Last 5 Encounters:  02/21/20 168 lb 9.6 oz (76.5 kg)  01/20/20 165 lb (74.8 kg)  01/07/20 173 lb 9.6 oz (78.7 kg)  12/20/19 173 lb 4.5 oz (78.6 kg)  08/14/19 174  lb 3.2 oz (79 kg)    BMI Readings from Last 5 Encounters:  02/21/20 23.51 kg/m  01/20/20 23.01 kg/m  01/07/20 24.21 kg/m  12/20/19 24.17 kg/m  08/14/19 24.30 kg/m     Physical Exam Vitals and nursing note reviewed.  Constitutional:      General: He is not in acute distress.    Appearance: Normal appearance. He is obese.  HENT:     Head: Normocephalic and atraumatic.     Right Ear: Hearing, tympanic membrane, ear canal and external ear normal. There is no impacted cerumen.     Left Ear: Hearing, tympanic membrane, ear canal and external ear normal. There is no impacted cerumen.     Nose: Nose normal. No mucosal edema or rhinorrhea.     Right Turbinates: Not enlarged.     Left Turbinates: Not enlarged.     Mouth/Throat:     Mouth: Mucous membranes are dry.     Pharynx: Oropharynx is clear. No oropharyngeal exudate.  Eyes:     Pupils: Pupils are equal, round, and reactive to light.  Cardiovascular:     Rate and Rhythm: Normal rate and regular rhythm.     Pulses: Normal pulses.     Heart sounds: Normal heart sounds. No murmur heard.   Pulmonary:     Effort: Pulmonary effort is normal.     Breath  sounds: Wheezing and rhonchi present. No decreased breath sounds or rales.  Musculoskeletal:     Cervical back: Normal range of motion.     Right lower leg: No edema.     Left lower leg: No edema.  Lymphadenopathy:     Cervical: No cervical adenopathy.  Skin:    General: Skin is warm and dry.     Capillary Refill: Capillary refill takes less than 2 seconds.     Findings: No erythema or rash.  Neurological:     General: No focal deficit present.     Mental Status: He is alert and oriented to person, place, and time.     Motor: No weakness.     Coordination: Coordination normal.     Gait: Gait is intact. Gait normal.  Psychiatric:        Mood and Affect: Mood normal.        Behavior: Behavior normal. Behavior is cooperative.        Thought Content: Thought content  normal.        Judgment: Judgment normal.       Assessment & Plan:   COPD with chronic bronchitis ([place]) Plan: Continue Stiolto Establish care with Dr. Silas Flood and 30-minute time slot  Chronic respiratory failure with hypoxia ([place]) Plan: Continue oxygen therapy  Bronchiectasis without complication ([place]) Plan: Encourage patient to resume flutter valve use -3 times daily Continue hypertonic saline nebs twice daily Continue monthly suppressive antibiotics: Next month should be 1 week of doxycycline the first week of October, 1 week of Augmentin the first week in November, and rotate Establish care with Dr. Silas Flood and 30-minute time slot     Return in about 6 weeks (around 04/03/2020), or if symptoms worsen or fail to improve, for Follow up with Dr. Silas Flood, [place].   Lauraine Rinne, NP 02/21/2020   This appointment required 32 minutes of patient care (this includes precharting, chart review, review of results, face-to-face care, etc.).

## 2020-03-08 ENCOUNTER — Other Ambulatory Visit: Payer: Self-pay | Admitting: Primary Care

## 2020-03-20 ENCOUNTER — Ambulatory Visit (INDEPENDENT_AMBULATORY_CARE_PROVIDER_SITE_OTHER): Payer: Medicare Other | Admitting: Pulmonary Disease

## 2020-03-20 ENCOUNTER — Other Ambulatory Visit: Payer: Self-pay

## 2020-03-20 ENCOUNTER — Encounter: Payer: Self-pay | Admitting: Pulmonary Disease

## 2020-03-20 VITALS — BP 114/70 | HR 108 | Temp 97.0°F | Ht 71.0 in | Wt 164.2 lb

## 2020-03-20 DIAGNOSIS — Z23 Encounter for immunization: Secondary | ICD-10-CM

## 2020-03-20 DIAGNOSIS — J449 Chronic obstructive pulmonary disease, unspecified: Secondary | ICD-10-CM

## 2020-03-20 NOTE — Patient Instructions (Signed)
Nice to meet you!  No changes today!  Flu shot today.  Come back in 3 months for follow up with Dr. Silas Flood
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
## 2020-03-30 ENCOUNTER — Other Ambulatory Visit: Payer: Self-pay | Admitting: Primary Care

## 2020-03-30 NOTE — Telephone Encounter (Signed)
Dr.  Silas Flood, please advise if you are okay refilling med or if pt needs to defer to PCP

## 2020-04-17 ENCOUNTER — Other Ambulatory Visit: Payer: Self-pay

## 2020-04-17 ENCOUNTER — Inpatient Hospital Stay: Payer: Medicare Other | Attending: Internal Medicine

## 2020-04-17 ENCOUNTER — Ambulatory Visit (HOSPITAL_COMMUNITY)
Admission: RE | Admit: 2020-04-17 | Discharge: 2020-04-17 | Disposition: A | Discharge status: Home / Self Care | Payer: Medicare Other | Source: Ambulatory Visit | Attending: Internal Medicine | Admitting: Internal Medicine

## 2020-04-17 DIAGNOSIS — C3492 Malignant neoplasm of unspecified part of left bronchus or lung: Secondary | ICD-10-CM | POA: Diagnosis not present

## 2020-04-17 DIAGNOSIS — J479 Bronchiectasis, uncomplicated: Secondary | ICD-10-CM | POA: Diagnosis not present

## 2020-04-17 DIAGNOSIS — C3490 Malignant neoplasm of unspecified part of unspecified bronchus or lung: Secondary | ICD-10-CM | POA: Insufficient documentation

## 2020-04-17 DIAGNOSIS — Z9981 Dependence on supplemental oxygen: Secondary | ICD-10-CM | POA: Diagnosis not present

## 2020-04-17 DIAGNOSIS — S2242XA Multiple fractures of ribs, left side, initial encounter for closed fracture: Secondary | ICD-10-CM | POA: Diagnosis not present

## 2020-04-17 DIAGNOSIS — R0602 Shortness of breath: Secondary | ICD-10-CM | POA: Diagnosis not present

## 2020-04-17 DIAGNOSIS — R911 Solitary pulmonary nodule: Secondary | ICD-10-CM | POA: Insufficient documentation

## 2020-04-17 DIAGNOSIS — Z85118 Personal history of other malignant neoplasm of bronchus and lung: Secondary | ICD-10-CM | POA: Insufficient documentation

## 2020-04-17 DIAGNOSIS — R059 Cough, unspecified: Secondary | ICD-10-CM | POA: Insufficient documentation

## 2020-04-17 DIAGNOSIS — J432 Centrilobular emphysema: Secondary | ICD-10-CM | POA: Diagnosis not present

## 2020-04-17 LAB — CMP (CANCER CENTER ONLY)
ALT: 8 U/L (ref 0–44)
AST: 15 U/L (ref 15–41)
Albumin: 3.5 g/dL (ref 3.5–5.0)
Alkaline Phosphatase: 76 U/L (ref 38–126)
Anion gap: 8 (ref 5–15)
BUN: 15 mg/dL (ref 8–23)
CO2: 30 mmol/L (ref 22–32)
Calcium: 8.7 mg/dL — ABNORMAL LOW (ref 8.9–10.3)
Chloride: 102 mmol/L (ref 98–111)
Creatinine: 1.02 mg/dL (ref 0.61–1.24)
GFR, Estimated: >60 mL/min (ref >60)
Glucose, Bld: 96 mg/dL (ref 70–99)
Potassium: 4 mmol/L (ref 3.5–5.1)
Sodium: 140 mmol/L (ref 135–145)
Total Bilirubin: 0.5 mg/dL (ref 0.3–1.2)
Total Protein: 7.7 g/dL (ref 6.5–8.1)

## 2020-04-17 LAB — CBC WITH DIFFERENTIAL (CANCER CENTER ONLY)
Abs Immature Granulocytes: 0.02 10*3/uL (ref 0.00–0.07)
Basophils Absolute: 0 10*3/uL (ref 0.0–0.1)
Basophils Relative: 1 %
Eosinophils Absolute: 0.3 10*3/uL (ref 0.0–0.5)
Eosinophils Relative: 3 %
HCT: 39.9 % (ref 39.0–52.0)
Hemoglobin: 12.5 g/dL — ABNORMAL LOW (ref 13.0–17.0)
Immature Granulocytes: 0 %
Lymphocytes Relative: 13 %
Lymphs Abs: 0.9 10*3/uL (ref 0.7–4.0)
MCH: 26.3 pg (ref 26.0–34.0)
MCHC: 31.3 g/dL (ref 30.0–36.0)
MCV: 83.8 fL (ref 80.0–100.0)
Monocytes Absolute: 1.1 10*3/uL — ABNORMAL HIGH (ref 0.1–1.0)
Monocytes Relative: 15 %
Neutro Abs: 5.1 10*3/uL (ref 1.7–7.7)
Neutrophils Relative %: 68 %
Platelet Count: 269 10*3/uL (ref 150–400)
RBC: 4.76 MIL/uL (ref 4.22–5.81)
RDW: 14.6 % (ref 11.5–15.5)
WBC Count: 7.4 10*3/uL (ref 4.0–10.5)
nRBC: 0 % (ref 0.0–0.2)

## 2020-04-17 MED ORDER — IOHEXOL 300 MG/ML  SOLN
75.0000 mL | Freq: Once | INTRAMUSCULAR | Status: AC | PRN
  Administered 2020-04-17: 75 mL via INTRAVENOUS

## 2020-04-20 ENCOUNTER — Inpatient Hospital Stay (HOSPITAL_BASED_OUTPATIENT_CLINIC_OR_DEPARTMENT_OTHER): Payer: Medicare Other | Admitting: Internal Medicine

## 2020-04-20 ENCOUNTER — Telehealth: Payer: Self-pay | Admitting: Internal Medicine

## 2020-04-20 ENCOUNTER — Encounter: Payer: Self-pay | Admitting: Internal Medicine

## 2020-04-20 ENCOUNTER — Other Ambulatory Visit: Payer: Self-pay

## 2020-04-20 VITALS — BP 113/80 | HR 113 | Temp 97.1°F | Resp 18 | Ht 71.0 in | Wt 166.2 lb

## 2020-04-20 DIAGNOSIS — R059 Cough, unspecified: Secondary | ICD-10-CM | POA: Diagnosis not present

## 2020-04-20 DIAGNOSIS — R0602 Shortness of breath: Secondary | ICD-10-CM | POA: Diagnosis not present

## 2020-04-20 DIAGNOSIS — C3490 Malignant neoplasm of unspecified part of unspecified bronchus or lung: Secondary | ICD-10-CM

## 2020-04-20 DIAGNOSIS — Z9981 Dependence on supplemental oxygen: Secondary | ICD-10-CM | POA: Diagnosis not present

## 2020-04-20 DIAGNOSIS — C3492 Malignant neoplasm of unspecified part of left bronchus or lung: Secondary | ICD-10-CM | POA: Diagnosis not present

## 2020-04-20 DIAGNOSIS — R911 Solitary pulmonary nodule: Secondary | ICD-10-CM | POA: Diagnosis not present

## 2020-04-20 DIAGNOSIS — Z85118 Personal history of other malignant neoplasm of bronchus and lung: Secondary | ICD-10-CM | POA: Diagnosis not present
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
## 2020-04-20 NOTE — Telephone Encounter (Signed)
Scheduled appointments per 11/15 los. Patient is aware of appointments dates and times.

## 2020-04-22 ENCOUNTER — Other Ambulatory Visit: Payer: Self-pay

## 2020-04-22 ENCOUNTER — Telehealth: Payer: Self-pay | Admitting: Pulmonary Disease

## 2020-04-22 ENCOUNTER — Encounter (HOSPITAL_COMMUNITY): Payer: Self-pay

## 2020-04-22 ENCOUNTER — Other Ambulatory Visit: Payer: Self-pay | Admitting: Pulmonary Disease

## 2020-04-22 ENCOUNTER — Emergency Department (HOSPITAL_COMMUNITY): Payer: No Typology Code available for payment source

## 2020-04-22 ENCOUNTER — Inpatient Hospital Stay (HOSPITAL_COMMUNITY)
Admission: EM | Admit: 2020-04-22 | Discharge: 2020-04-24 | DRG: 871 | Disposition: A | Discharge status: Home Health | Payer: No Typology Code available for payment source | Attending: Internal Medicine | Admitting: Internal Medicine

## 2020-04-22 DIAGNOSIS — J471 Bronchiectasis with (acute) exacerbation: Secondary | ICD-10-CM | POA: Diagnosis present

## 2020-04-22 DIAGNOSIS — J9611 Chronic respiratory failure with hypoxia: Secondary | ICD-10-CM | POA: Diagnosis present

## 2020-04-22 DIAGNOSIS — R0602 Shortness of breath: Secondary | ICD-10-CM | POA: Diagnosis not present

## 2020-04-22 DIAGNOSIS — Z79899 Other long term (current) drug therapy: Secondary | ICD-10-CM

## 2020-04-22 DIAGNOSIS — J690 Pneumonitis due to inhalation of food and vomit: Secondary | ICD-10-CM

## 2020-04-22 DIAGNOSIS — R079 Chest pain, unspecified: Secondary | ICD-10-CM | POA: Diagnosis not present

## 2020-04-22 DIAGNOSIS — F419 Anxiety disorder, unspecified: Secondary | ICD-10-CM | POA: Diagnosis present

## 2020-04-22 DIAGNOSIS — J189 Pneumonia, unspecified organism: Secondary | ICD-10-CM | POA: Diagnosis present

## 2020-04-22 DIAGNOSIS — G2581 Restless legs syndrome: Secondary | ICD-10-CM | POA: Diagnosis present

## 2020-04-22 DIAGNOSIS — K219 Gastro-esophageal reflux disease without esophagitis: Secondary | ICD-10-CM | POA: Diagnosis present

## 2020-04-22 DIAGNOSIS — Z801 Family history of malignant neoplasm of trachea, bronchus and lung: Secondary | ICD-10-CM

## 2020-04-22 DIAGNOSIS — I10 Essential (primary) hypertension: Secondary | ICD-10-CM | POA: Diagnosis present

## 2020-04-22 DIAGNOSIS — Z923 Personal history of irradiation: Secondary | ICD-10-CM

## 2020-04-22 DIAGNOSIS — Z888 Allergy status to other drugs, medicaments and biological substances status: Secondary | ICD-10-CM

## 2020-04-22 DIAGNOSIS — R509 Fever, unspecified: Secondary | ICD-10-CM | POA: Diagnosis not present

## 2020-04-22 DIAGNOSIS — A419 Sepsis, unspecified organism: Principal | ICD-10-CM | POA: Diagnosis present

## 2020-04-22 DIAGNOSIS — R600 Localized edema: Secondary | ICD-10-CM | POA: Diagnosis present

## 2020-04-22 DIAGNOSIS — D638 Anemia in other chronic diseases classified elsewhere: Secondary | ICD-10-CM | POA: Diagnosis present

## 2020-04-22 DIAGNOSIS — G8929 Other chronic pain: Secondary | ICD-10-CM | POA: Diagnosis present

## 2020-04-22 DIAGNOSIS — C3490 Malignant neoplasm of unspecified part of unspecified bronchus or lung: Secondary | ICD-10-CM | POA: Diagnosis not present

## 2020-04-22 DIAGNOSIS — Z85118 Personal history of other malignant neoplasm of bronchus and lung: Secondary | ICD-10-CM

## 2020-04-22 DIAGNOSIS — Z20822 Contact with and (suspected) exposure to covid-19: Secondary | ICD-10-CM | POA: Diagnosis present

## 2020-04-22 DIAGNOSIS — J440 Chronic obstructive pulmonary disease with acute lower respiratory infection: Secondary | ICD-10-CM | POA: Diagnosis present

## 2020-04-22 DIAGNOSIS — Z902 Acquired absence of lung [part of]: Secondary | ICD-10-CM

## 2020-04-22 DIAGNOSIS — Z9981 Dependence on supplemental oxygen: Secondary | ICD-10-CM

## 2020-04-22 DIAGNOSIS — R7303 Prediabetes: Secondary | ICD-10-CM | POA: Diagnosis present

## 2020-04-22 DIAGNOSIS — Z87891 Personal history of nicotine dependence: Secondary | ICD-10-CM

## 2020-04-22 DIAGNOSIS — Z87820 Personal history of traumatic brain injury: Secondary | ICD-10-CM

## 2020-04-22 DIAGNOSIS — R739 Hyperglycemia, unspecified: Secondary | ICD-10-CM | POA: Diagnosis present

## 2020-04-22 LAB — COMPREHENSIVE METABOLIC PANEL
ALT: 12 U/L (ref 0–44)
AST: 16 U/L (ref 15–41)
Albumin: 3.8 g/dL (ref 3.5–5.0)
Alkaline Phosphatase: 70 U/L (ref 38–126)
Anion gap: 11 (ref 5–15)
BUN: 16 mg/dL (ref 8–23)
CO2: 26 mmol/L (ref 22–32)
Calcium: 8.8 mg/dL — ABNORMAL LOW (ref 8.9–10.3)
Chloride: 99 mmol/L (ref 98–111)
Creatinine, Ser: 0.89 mg/dL (ref 0.61–1.24)
GFR, Estimated: >60 mL/min (ref >60)
Glucose, Bld: 114 mg/dL — ABNORMAL HIGH (ref 70–99)
Potassium: 4.2 mmol/L (ref 3.5–5.1)
Sodium: 136 mmol/L (ref 135–145)
Total Bilirubin: 0.8 mg/dL (ref 0.3–1.2)
Total Protein: 8 g/dL (ref 6.5–8.1)

## 2020-04-22 LAB — CBC
HCT: 42.5 % (ref 39.0–52.0)
Hemoglobin: 13.2 g/dL (ref 13.0–17.0)
MCH: 26.4 pg (ref 26.0–34.0)
MCHC: 31.1 g/dL (ref 30.0–36.0)
MCV: 85 fL (ref 80.0–100.0)
Platelets: 256 10*3/uL (ref 150–400)
RBC: 5 MIL/uL (ref 4.22–5.81)
RDW: 14.7 % (ref 11.5–15.5)
WBC: 15.6 10*3/uL — ABNORMAL HIGH (ref 4.0–10.5)
nRBC: 0 % (ref 0.0–0.2)

## 2020-04-22 MED ORDER — METHYLPREDNISOLONE SODIUM SUCC 125 MG IJ SOLR
125.0000 mg | Freq: Once | INTRAMUSCULAR | Status: AC
  Administered 2020-04-23: 125 mg via INTRAVENOUS
  Filled 2020-04-22: qty 2

## 2020-04-22 MED ORDER — IPRATROPIUM-ALBUTEROL 0.5-2.5 (3) MG/3ML IN SOLN: 3.0000 mL | RESPIRATORY_TRACT | Status: AC

## 2020-04-22 MED ORDER — ACETAMINOPHEN 325 MG PO TABS
650.0000 mg | ORAL_TABLET | Freq: Once | ORAL | Status: AC | PRN
  Administered 2020-04-22: 650 mg via ORAL
  Filled 2020-04-22: qty 2

## 2020-04-22 NOTE — ED Triage Notes (Signed)
Pt states that he started feeling weak and achy on Tuesday afternoon, Tuesday night he had a fever  Pt wears 3liters of O2 at home Pt states the right side of his cheat started to hurt and he couldn't take a deep breath Pt has been vaccinated and has never had COVID

## 2020-04-22 NOTE — ED Notes (Signed)
Lactic acid collected also
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
## 2020-04-22 NOTE — Telephone Encounter (Signed)
Called and spoke with pt letting him know recommendations per Dr. Silas Flood and pt verbalized understanding. Nothing further needed.

## 2020-04-22 NOTE — Telephone Encounter (Signed)
Called and spoke with pt who stated he ran a temp overnight of 100.2. pt said today his temp was 97.0 while I was on the phone with him.  Pt has been more SOB, has had head congestion as well as body aches. Pt also states he has been having trouble taking a deep breath due to having pain on his right side.  Pt said on RA his sats were 74% as he had to walk from house to garage and back to check his temp as well as to check his sats with pulse ox. Pt did put O2 on and said he did get his sats up to 85% on 3L pulse.  Pt wants to know what is recommended to help with symptoms. Dr. Silas Flood please advise.

## 2020-04-22 NOTE — ED Notes (Signed)
Family at bedside. 

## 2020-04-22 NOTE — Telephone Encounter (Signed)
Worry that his oxygen is below 88% on his baseline 3L. Given worsened hypoxemia I advise he goes to the ED for evaluation. ED should obtain sputum cultures, chest xray, and consider starting IV antibiotics directed against pesudomonas since he has grown that in the past.

## 2020-04-23 ENCOUNTER — Encounter (HOSPITAL_COMMUNITY): Payer: Self-pay | Admitting: Emergency Medicine

## 2020-04-23 ENCOUNTER — Emergency Department (HOSPITAL_COMMUNITY): Payer: No Typology Code available for payment source

## 2020-04-23 DIAGNOSIS — C3492 Malignant neoplasm of unspecified part of left bronchus or lung: Secondary | ICD-10-CM | POA: Diagnosis not present

## 2020-04-23 DIAGNOSIS — K219 Gastro-esophageal reflux disease without esophagitis: Secondary | ICD-10-CM | POA: Diagnosis present

## 2020-04-23 DIAGNOSIS — J9611 Chronic respiratory failure with hypoxia: Secondary | ICD-10-CM | POA: Diagnosis present

## 2020-04-23 DIAGNOSIS — I10 Essential (primary) hypertension: Secondary | ICD-10-CM

## 2020-04-23 DIAGNOSIS — G8929 Other chronic pain: Secondary | ICD-10-CM | POA: Diagnosis present

## 2020-04-23 DIAGNOSIS — Z801 Family history of malignant neoplasm of trachea, bronchus and lung: Secondary | ICD-10-CM | POA: Diagnosis not present

## 2020-04-23 DIAGNOSIS — Z888 Allergy status to other drugs, medicaments and biological substances status: Secondary | ICD-10-CM | POA: Diagnosis not present

## 2020-04-23 DIAGNOSIS — A419 Sepsis, unspecified organism: Secondary | ICD-10-CM | POA: Diagnosis present

## 2020-04-23 DIAGNOSIS — R0602 Shortness of breath: Secondary | ICD-10-CM | POA: Diagnosis not present

## 2020-04-23 DIAGNOSIS — G2581 Restless legs syndrome: Secondary | ICD-10-CM

## 2020-04-23 DIAGNOSIS — J189 Pneumonia, unspecified organism: Secondary | ICD-10-CM | POA: Diagnosis not present

## 2020-04-23 DIAGNOSIS — Z85118 Personal history of other malignant neoplasm of bronchus and lung: Secondary | ICD-10-CM | POA: Diagnosis not present

## 2020-04-23 DIAGNOSIS — Z79899 Other long term (current) drug therapy: Secondary | ICD-10-CM | POA: Diagnosis not present

## 2020-04-23 DIAGNOSIS — Z20822 Contact with and (suspected) exposure to covid-19: Secondary | ICD-10-CM | POA: Diagnosis present

## 2020-04-23 DIAGNOSIS — Z923 Personal history of irradiation: Secondary | ICD-10-CM | POA: Diagnosis not present

## 2020-04-23 DIAGNOSIS — R079 Chest pain, unspecified: Secondary | ICD-10-CM | POA: Diagnosis not present

## 2020-04-23 DIAGNOSIS — F419 Anxiety disorder, unspecified: Secondary | ICD-10-CM | POA: Diagnosis present

## 2020-04-23 DIAGNOSIS — J440 Chronic obstructive pulmonary disease with acute lower respiratory infection: Secondary | ICD-10-CM | POA: Diagnosis present

## 2020-04-23 DIAGNOSIS — D638 Anemia in other chronic diseases classified elsewhere: Secondary | ICD-10-CM | POA: Diagnosis present

## 2020-04-23 DIAGNOSIS — Z902 Acquired absence of lung [part of]: Secondary | ICD-10-CM | POA: Diagnosis not present

## 2020-04-23 DIAGNOSIS — R600 Localized edema: Secondary | ICD-10-CM | POA: Diagnosis present

## 2020-04-23 DIAGNOSIS — Z87891 Personal history of nicotine dependence: Secondary | ICD-10-CM | POA: Diagnosis not present

## 2020-04-23 DIAGNOSIS — R7303 Prediabetes: Secondary | ICD-10-CM | POA: Diagnosis present

## 2020-04-23 DIAGNOSIS — Z87820 Personal history of traumatic brain injury: Secondary | ICD-10-CM | POA: Diagnosis not present

## 2020-04-23 DIAGNOSIS — Z9981 Dependence on supplemental oxygen: Secondary | ICD-10-CM | POA: Diagnosis not present

## 2020-04-23 DIAGNOSIS — J471 Bronchiectasis with (acute) exacerbation: Secondary | ICD-10-CM

## 2020-04-23 DIAGNOSIS — R739 Hyperglycemia, unspecified: Secondary | ICD-10-CM | POA: Diagnosis present

## 2020-04-23 DIAGNOSIS — J90 Pleural effusion, not elsewhere classified: Secondary | ICD-10-CM | POA: Diagnosis not present

## 2020-04-23 LAB — COMPREHENSIVE METABOLIC PANEL
ALT: 12 U/L (ref 0–44)
AST: 15 U/L (ref 15–41)
Albumin: 3.3 g/dL — ABNORMAL LOW (ref 3.5–5.0)
Alkaline Phosphatase: 60 U/L (ref 38–126)
Anion gap: 10 (ref 5–15)
BUN: 14 mg/dL (ref 8–23)
CO2: 28 mmol/L (ref 22–32)
Calcium: 8.6 mg/dL — ABNORMAL LOW (ref 8.9–10.3)
Chloride: 100 mmol/L (ref 98–111)
Creatinine, Ser: 0.82 mg/dL (ref 0.61–1.24)
GFR, Estimated: >60 mL/min (ref >60)
Glucose, Bld: 159 mg/dL — ABNORMAL HIGH (ref 70–99)
Potassium: 4.3 mmol/L (ref 3.5–5.1)
Sodium: 138 mmol/L (ref 135–145)
Total Bilirubin: 0.5 mg/dL (ref 0.3–1.2)
Total Protein: 7.4 g/dL (ref 6.5–8.1)

## 2020-04-23 LAB — CBC WITH DIFFERENTIAL/PLATELET
Abs Immature Granulocytes: 0.03 10*3/uL (ref 0.00–0.07)
Basophils Absolute: 0 10*3/uL (ref 0.0–0.1)
Basophils Relative: 0 %
Eosinophils Absolute: 0 10*3/uL (ref 0.0–0.5)
Eosinophils Relative: 0 %
HCT: 38.5 % — ABNORMAL LOW (ref 39.0–52.0)
Hemoglobin: 12.2 g/dL — ABNORMAL LOW (ref 13.0–17.0)
Immature Granulocytes: 0 %
Lymphocytes Relative: 5 %
Lymphs Abs: 0.5 10*3/uL — ABNORMAL LOW (ref 0.7–4.0)
MCH: 27 pg (ref 26.0–34.0)
MCHC: 31.7 g/dL (ref 30.0–36.0)
MCV: 85.2 fL (ref 80.0–100.0)
Monocytes Absolute: 0.2 10*3/uL (ref 0.1–1.0)
Monocytes Relative: 2 %
Neutro Abs: 9 10*3/uL — ABNORMAL HIGH (ref 1.7–7.7)
Neutrophils Relative %: 93 %
Platelets: 212 10*3/uL (ref 150–400)
RBC: 4.52 MIL/uL (ref 4.22–5.81)
RDW: 14.6 % (ref 11.5–15.5)
WBC: 9.7 10*3/uL (ref 4.0–10.5)
nRBC: 0 % (ref 0.0–0.2)

## 2020-04-23 LAB — STREP PNEUMONIAE URINARY ANTIGEN: Strep Pneumo Urinary Antigen: NEGATIVE

## 2020-04-23 LAB — PHOSPHORUS: Phosphorus: 3.3 mg/dL (ref 2.5–4.6)

## 2020-04-23 LAB — EXPECTORATED SPUTUM ASSESSMENT W GRAM STAIN, RFLX TO RESP C

## 2020-04-23 LAB — RESPIRATORY PANEL BY RT PCR (FLU A&B, COVID)
Influenza A by PCR: NEGATIVE
Influenza B by PCR: NEGATIVE
SARS Coronavirus 2 by RT PCR: NEGATIVE

## 2020-04-23 LAB — D-DIMER, QUANTITATIVE: D-Dimer, Quant: 2 ug/mL-FEU — ABNORMAL HIGH (ref 0.00–0.50)

## 2020-04-23 LAB — MAGNESIUM: Magnesium: 2.2 mg/dL (ref 1.7–2.4)

## 2020-04-23 LAB — TROPONIN I (HIGH SENSITIVITY): Troponin I (High Sensitivity): 7 ng/L (ref <18)

## 2020-04-23 LAB — LACTIC ACID, PLASMA: Lactic Acid, Venous: 1 mmol/L (ref 0.5–1.9)

## 2020-04-23 MED ORDER — IPRATROPIUM BROMIDE HFA 17 MCG/ACT IN AERS
2.0000 | INHALATION_SPRAY | Freq: Once | RESPIRATORY_TRACT | Status: AC
  Administered 2020-04-23: 2 via RESPIRATORY_TRACT
  Filled 2020-04-23: qty 12.9

## 2020-04-23 MED ORDER — ROPINIROLE HCL 1 MG PO TABS
1.0000 mg | ORAL_TABLET | Freq: Every day | ORAL | Status: DC
  Administered 2020-04-23 (×2): 1 mg via ORAL
  Filled 2020-04-23 (×2): qty 1

## 2020-04-23 MED ORDER — ROPINIROLE HCL 0.5 MG PO TABS
0.5000 mg | ORAL_TABLET | ORAL | Status: DC
  Filled 2020-04-23: qty 1

## 2020-04-23 MED ORDER — LEVOFLOXACIN IN D5W 750 MG/150ML IV SOLN
750.0000 mg | Freq: Once | INTRAVENOUS | Status: AC
  Administered 2020-04-23: 750 mg via INTRAVENOUS
  Filled 2020-04-23: qty 150

## 2020-04-23 MED ORDER — SODIUM CHLORIDE 3 % IN NEBU
4.0000 mL | INHALATION_SOLUTION | Freq: Two times a day (BID) | RESPIRATORY_TRACT | Status: DC | PRN
  Filled 2020-04-23: qty 4

## 2020-04-23 MED ORDER — GUAIFENESIN ER 600 MG PO TB12
600.0000 mg | ORAL_TABLET | Freq: Two times a day (BID) | ORAL | Status: DC
  Filled 2020-04-23: qty 1

## 2020-04-23 MED ORDER — POLYETHYLENE GLYCOL 3350 17 G PO PACK: 17.0000 g | PACK | Freq: Every day | ORAL | Status: DC | PRN

## 2020-04-23 MED ORDER — ACETAMINOPHEN 325 MG PO TABS: 650.0000 mg | ORAL_TABLET | Freq: Four times a day (QID) | ORAL | Status: DC | PRN

## 2020-04-23 MED ORDER — ALBUTEROL SULFATE HFA 108 (90 BASE) MCG/ACT IN AERS
8.0000 | INHALATION_SPRAY | RESPIRATORY_TRACT | Status: AC
  Administered 2020-04-23 (×3): 8 via RESPIRATORY_TRACT
  Filled 2020-04-23: qty 6.7

## 2020-04-23 MED ORDER — SODIUM CHLORIDE 0.9 % IV SOLN
500.0000 mg | INTRAVENOUS | Status: DC
  Administered 2020-04-24: 500 mg via INTRAVENOUS
  Filled 2020-04-23: qty 500

## 2020-04-23 MED ORDER — SODIUM CHLORIDE 0.9 % IV SOLN
INTRAVENOUS | Status: DC | PRN
  Administered 2020-04-23: 1000 mL via INTRAVENOUS

## 2020-04-23 MED ORDER — ACETAMINOPHEN 650 MG RE SUPP: 650.0000 mg | Freq: Four times a day (QID) | RECTAL | Status: DC | PRN

## 2020-04-23 MED ORDER — GUAIFENESIN ER 600 MG PO TB12
1200.0000 mg | ORAL_TABLET | Freq: Two times a day (BID) | ORAL | Status: DC
  Administered 2020-04-23 – 2020-04-24 (×3): 1200 mg via ORAL
  Filled 2020-04-23 (×2): qty 2

## 2020-04-23 MED ORDER — SODIUM CHLORIDE (PF) 0.9 % IJ SOLN
INTRAMUSCULAR | Status: AC
  Filled 2020-04-23: qty 50

## 2020-04-23 MED ORDER — TIOTROPIUM BROMIDE-OLODATEROL 2.5-2.5 MCG/ACT IN AERS
2.0000 | INHALATION_SPRAY | Freq: Every day | RESPIRATORY_TRACT | Status: DC
  Administered 2020-04-23 – 2020-04-24 (×2): 2 via RESPIRATORY_TRACT

## 2020-04-23 MED ORDER — IOHEXOL 350 MG/ML SOLN
100.0000 mL | Freq: Once | INTRAVENOUS | Status: AC | PRN
  Administered 2020-04-23: 100 mL via INTRAVENOUS

## 2020-04-23 MED ORDER — SODIUM CHLORIDE 0.9 % IV SOLN
2.0000 g | INTRAVENOUS | Status: DC
  Administered 2020-04-23 – 2020-04-24 (×2): 2 g via INTRAVENOUS
  Filled 2020-04-23 (×2): qty 2

## 2020-04-23 MED ORDER — CLONAZEPAM 0.5 MG PO TABS
0.5000 mg | ORAL_TABLET | Freq: Two times a day (BID) | ORAL | Status: DC
  Administered 2020-04-23 – 2020-04-24 (×3): 0.5 mg via ORAL
  Filled 2020-04-23 (×3): qty 1

## 2020-04-23 MED ORDER — TAMSULOSIN HCL 0.4 MG PO CAPS
0.4000 mg | ORAL_CAPSULE | Freq: Every day | ORAL | Status: DC
  Administered 2020-04-23 – 2020-04-24 (×2): 0.4 mg via ORAL
  Filled 2020-04-23 (×2): qty 1

## 2020-04-23 MED ORDER — LACTATED RINGERS IV BOLUS
1000.0000 mL | Freq: Once | INTRAVENOUS | Status: AC
  Administered 2020-04-23: 1000 mL via INTRAVENOUS

## 2020-04-23 MED ORDER — ALBUTEROL SULFATE (2.5 MG/3ML) 0.083% IN NEBU
2.5000 mg | INHALATION_SOLUTION | RESPIRATORY_TRACT | Status: DC | PRN
  Administered 2020-04-23 – 2020-04-24 (×2): 2.5 mg via RESPIRATORY_TRACT
  Filled 2020-04-23 (×2): qty 3

## 2020-04-23 MED ORDER — FUROSEMIDE 20 MG PO TABS
20.0000 mg | ORAL_TABLET | Freq: Every day | ORAL | Status: DC
  Administered 2020-04-23 – 2020-04-24 (×2): 20 mg via ORAL
  Filled 2020-04-23 (×2): qty 1

## 2020-04-23 MED ORDER — ENOXAPARIN SODIUM 40 MG/0.4ML ~~LOC~~ SOLN
40.0000 mg | SUBCUTANEOUS | Status: DC
  Administered 2020-04-23 – 2020-04-24 (×2): 40 mg via SUBCUTANEOUS
  Filled 2020-04-23 (×2): qty 0.4

## 2020-04-23 MED ORDER — OXYCODONE HCL 5 MG PO TABS
5.0000 mg | ORAL_TABLET | Freq: Four times a day (QID) | ORAL | Status: DC | PRN
  Administered 2020-04-23 – 2020-04-24 (×2): 5 mg via ORAL
  Filled 2020-04-23 (×2): qty 1

## 2020-04-23 NOTE — Progress Notes (Addendum)
Care started prior to midnight in the emergency room and patient was admitted early this morning after midnight by Dr. Alfonso Patten shot Hal Hope and I am in current agreement with this assessment and plan.  Additional changes to the plan of care have been made accordingly.  The patient is a 74 year old Caucasian male with a past medical history is significant for but not limited to non-small cell lung cancer followed by Dr. Earlie Server who recently follow-up with his oncologist and had CT scanning which showed stability the last 2 days but subsequently developed increasing fevers, chills, productive cough as well as right-sided pleuritic chest pain in nature.  Given these symptoms he presented to the ED.  He is found to be septic on admission and had a fever of 102 as well as tachypnea, tachycardia.  D-dimer was mildly elevated around 2.  He ended up having an elevated white blood cell count as well and CTA of the chest was done was negative for PE but did show consolidation in right lower lobe concerning for pneumonia as well as concern for some aspiration.  Covid testing was negative.  Currently patient's been admitted and treated for the following but not limited to:    Sepsis 2/2 Community-acquired pneumonia, poA -Patient presented with sepsis physiology and was febrile with a temperature of 102, pulse rate of 118, respiratory rate of 30, leukocytosis no source of infection in the lungs with a pneumonia -Patient has been placed on empiric antibiotics and received Levofloxacin in the ED and changed to Ceftriaxone/Azithormycin -Will be checking urine for Legionella strep antigen sputum cultures and closely monitor respiratory status.  -Given that patient has history of immunocompromise state with history of lung cancer and also chronic bronchiectasis with chronic respiratory failure will need close observation. -We will place on guaifenesin 1201 p.o. twice daily, flutter valve, incentive spirometry and continue with  empiric antibiotics -Continue with nebs and breathing treatments along with inhalers -Given a dose of Solu-Medrol 125 mg once in the ED; was not wheezing today but was rhonchorous bilaterally -C/w With hypertonic saline nebs -Get an SLP evaluation to make sure the patient not silently aspirating -Repeat CXR in the AM   History of chronic bronchiectasis with chronic respiratory failure on 3 L oxygen  -At home takes suppressive antibiotics every month on the first week.  -Continue with on inhalers. -Will need an Ambulatory Home O2 Screen in the AM  Squamous cell carcinoma of the lung  -followed by Dr. Julien Nordmann.  -Patient will need follow-up imaging to ensure complete resolution of pneumonia. -Have notified Dr. Julien Nordmann as a courtesy via epic  History of Bilateral lower extremity edema  -C/w Lasix.  Restless Leg Syndrome  -C/w Requip.  Normocytic Anemia -Patient is hemoglobin/marker dropped from 13.2/42.5 is now 12.2/30.5  -Check anemia panel in a.m. -Continue to monitor for signs and symptoms of bleeding; currently no overt bleeding noted -Repeat CBC in the AM  Hyperglycemia -Likely in the setting of steroid demargination- -Blood sugars ranged from 114-159 on daily BMP/CMP's -Check hemoglobin A1c to rule out Diabetes -Continue monitor blood sugars carefully and if necessary will place on sensitive NovoLog sign scale insulin  We will continue to monitor the patient's clinical response to intervention and repeat blood work and imaging in the a.m.
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
## 2020-04-23 NOTE — Progress Notes (Signed)
Occupational Therapy Evaluation  Patient mod I for functional transfers and LB self care during evaluation while maintaining in low 90s on 3L. Educated patient on energy conservation strategies for home, to monitor O2 with home pulse ox while gradually increasing ambulation/functional activity in order to build back endurance and to utilize IS patient states he has at home to aid in recovery from PNA and maximize independence with daily routine. Patient verbalize understanding, no further acute OT needs at this time. Please re-consult if new needs arise.    04/23/20 1400  OT Visit Information  Last OT Received On 04/23/20  Assistance Needed +1  History of Present Illness Jeffrey Hines is a 74 y.o. male with history of non-small cell lung cancer followed by Dr. Flonnie Hailstone recently followed up with patient's oncologist had CT scanning which was showing stability last 2 days has been experiencing increasing fever chills productive cough and right-sided chest pain pleuritic in nature.  Precautions  Precautions Fall  Restrictions  Weight Bearing Restrictions No  Home Living  Family/patient expects to be discharged to: Private residence  Living Arrangements Spouse/significant other  Available Help at Discharge Family;Available 24 hours/day  Type of Home House  Home Access Stairs to enter  Entrance Stairs-Number of Steps 4  Entrance Stairs-Rails Right  Home Layout One level  Bathroom Shower/Tub Walk-in shower;Tub/shower unit  [place] held shower head;Journalist, newspaper  Additional Comments uses O2 at night/with activity during day as needed   Prior Function  Level of Independence Independent  Communication  Communication HOH  Pain Assessment  Pain Assessment Faces  Faces Pain Scale 2  Pain Location R chest with breathing  Pain Descriptors / Indicators Aching  Pain Intervention(s) Monitored  during session  Cognition  Arousal/Alertness Awake/alert  Behavior During Therapy WFL for tasks assessed/performed  Overall Cognitive Status Within Functional Limits for tasks assessed  Upper Extremity Assessment  Upper Extremity Assessment Overall WFL for tasks assessed  Lower Extremity Assessment  Lower Extremity Assessment Defer to PT evaluation  ADL  Overall ADL's  Modified independent  General ADL Comments patient able to perform functional transfer, LB dressing task without physical assistance. Patient educated on energy conservation strategies such as using shower chair with bathing and how to gradually increase activity while monitoring O2 with pulse ox at home in order to build back endurance. Patient reports has an IS at home, review use of device and strongly encourage to use at home to aid in recovery from PNA  Bed Mobility  Overal bed mobility Modified Independent  Transfers  Overall transfer level Modified independent  Equipment used None  Balance  Overall balance assessment Modified Independent  General Comments  General comments (skin integrity, edema, etc.) O2 maintain in low 90s with transfer on 3L  OT - End of Session  Equipment Utilized During Treatment Oxygen  Activity Tolerance Patient tolerated treatment well  Patient left in chair;with call bell/phone within reach;with chair alarm set  Nurse Communication Mobility status  OT Assessment  OT Recommendation/Assessment Patient does not need any further OT services  OT Visit Diagnosis Other abnormalities of gait and mobility (R26.89)  OT Problem List Decreased activity tolerance  AM-PAC OT "6 Clicks" Daily Activity Outcome Measure (Version 2)  Help from another person eating meals? 4  Help from another person taking care of personal grooming? 4  Help from another person toileting, which includes using toliet, bedpan, or urinal? 4  Help from  another person bathing (including washing, rinsing, drying)? 4  Help from  another person to put on and taking off regular upper body clothing? 4  Help from another person to put on and taking off regular lower body clothing? 4  6 Click Score 24  OT Recommendation  Follow Up Recommendations No OT follow up  OT Equipment None recommended by OT  Acute Rehab OT Goals  Patient Stated Goal be able to go to garage, my man cave  OT Goal Formulation All assessment and education complete, DC therapy  OT Time Calculation  OT Start Time (ACUTE ONLY) 1338  OT Stop Time (ACUTE ONLY) 1402  OT Time Calculation (min) 24 min  OT General Charges  $OT Visit 1 Visit  OT Evaluation  $OT Eval Low Complexity 1 Low  OT Treatments  $Self Care/Home Management  8-22 mins  Written Expression  Dominant Hand Right   Delbert Phenix OT OT pager: [phone]

## 2020-04-23 NOTE — Progress Notes (Signed)
PT demonstrated hands on understanding of Flutter device. PT has strong, non productive cough at this time.

## 2020-04-24 ENCOUNTER — Inpatient Hospital Stay (HOSPITAL_COMMUNITY): Payer: No Typology Code available for payment source

## 2020-04-24 DIAGNOSIS — G2581 Restless legs syndrome: Secondary | ICD-10-CM | POA: Diagnosis not present

## 2020-04-24 DIAGNOSIS — I10 Essential (primary) hypertension: Secondary | ICD-10-CM | POA: Diagnosis not present

## 2020-04-24 DIAGNOSIS — J189 Pneumonia, unspecified organism: Secondary | ICD-10-CM | POA: Diagnosis not present

## 2020-04-24 DIAGNOSIS — J471 Bronchiectasis with (acute) exacerbation: Secondary | ICD-10-CM | POA: Diagnosis not present

## 2020-04-24 LAB — CBC WITH DIFFERENTIAL/PLATELET
Abs Immature Granulocytes: 0.06 10*3/uL (ref 0.00–0.07)
Basophils Absolute: 0 10*3/uL (ref 0.0–0.1)
Basophils Relative: 0 %
Eosinophils Absolute: 0 10*3/uL (ref 0.0–0.5)
Eosinophils Relative: 0 %
HCT: 36.9 % — ABNORMAL LOW (ref 39.0–52.0)
Hemoglobin: 11.5 g/dL — ABNORMAL LOW (ref 13.0–17.0)
Immature Granulocytes: 1 %
Lymphocytes Relative: 7 %
Lymphs Abs: 1 10*3/uL (ref 0.7–4.0)
MCH: 26.4 pg (ref 26.0–34.0)
MCHC: 31.2 g/dL (ref 30.0–36.0)
MCV: 84.8 fL (ref 80.0–100.0)
Monocytes Absolute: 1.3 10*3/uL — ABNORMAL HIGH (ref 0.1–1.0)
Monocytes Relative: 10 %
Neutro Abs: 11 10*3/uL — ABNORMAL HIGH (ref 1.7–7.7)
Neutrophils Relative %: 82 %
Platelets: 209 10*3/uL (ref 150–400)
RBC: 4.35 MIL/uL (ref 4.22–5.81)
RDW: 14.6 % (ref 11.5–15.5)
WBC: 13.3 10*3/uL — ABNORMAL HIGH (ref 4.0–10.5)
nRBC: 0 % (ref 0.0–0.2)

## 2020-04-24 LAB — IRON AND TIBC
Iron: 30 ug/dL — ABNORMAL LOW (ref 45–182)
Saturation Ratios: 12 % — ABNORMAL LOW (ref 17.9–39.5)
TIBC: 241 ug/dL — ABNORMAL LOW (ref 250–450)
UIBC: 211 ug/dL

## 2020-04-24 LAB — COMPREHENSIVE METABOLIC PANEL
ALT: 11 U/L (ref 0–44)
AST: 14 U/L — ABNORMAL LOW (ref 15–41)
Albumin: 3.2 g/dL — ABNORMAL LOW (ref 3.5–5.0)
Alkaline Phosphatase: 59 U/L (ref 38–126)
Anion gap: 9 (ref 5–15)
BUN: 17 mg/dL (ref 8–23)
CO2: 28 mmol/L (ref 22–32)
Calcium: 8.5 mg/dL — ABNORMAL LOW (ref 8.9–10.3)
Chloride: 103 mmol/L (ref 98–111)
Creatinine, Ser: 0.85 mg/dL (ref 0.61–1.24)
GFR, Estimated: >60 mL/min (ref >60)
Glucose, Bld: 105 mg/dL — ABNORMAL HIGH (ref 70–99)
Potassium: 4.3 mmol/L (ref 3.5–5.1)
Sodium: 140 mmol/L (ref 135–145)
Total Bilirubin: 0.3 mg/dL (ref 0.3–1.2)
Total Protein: 7.1 g/dL (ref 6.5–8.1)

## 2020-04-24 LAB — FERRITIN: Ferritin: 150 ng/mL (ref 24–336)

## 2020-04-24 LAB — RETICULOCYTES
Immature Retic Fract: 15 % (ref 2.3–15.9)
RBC.: 4.3 MIL/uL (ref 4.22–5.81)
Retic Count, Absolute: 46 10*3/uL (ref 19.0–186.0)
Retic Ct Pct: 1.1 % (ref 0.4–3.1)

## 2020-04-24 LAB — HEMOGLOBIN A1C
Hgb A1c MFr Bld: 5.8 % — ABNORMAL HIGH (ref 4.8–5.6)
Mean Plasma Glucose: 119.76 mg/dL

## 2020-04-24 LAB — FOLATE: Folate: 5.1 ng/mL — ABNORMAL LOW (ref >5.9)

## 2020-04-24 LAB — VITAMIN B12: Vitamin B-12: 169 pg/mL — ABNORMAL LOW (ref 180–914)

## 2020-04-24 LAB — PHOSPHORUS: Phosphorus: 3.1 mg/dL (ref 2.5–4.6)

## 2020-04-24 LAB — MAGNESIUM: Magnesium: 2.1 mg/dL (ref 1.7–2.4)

## 2020-04-24 MED ORDER — AZITHROMYCIN 250 MG PO TABS: 500.0000 mg | ORAL_TABLET | Freq: Every day | ORAL | Status: DC

## 2020-04-24 MED ORDER — AMOXICILLIN-POT CLAVULANATE 875-125 MG PO TABS: 1.0000 | ORAL_TABLET | Freq: Two times a day (BID) | ORAL | 0 refills | Status: AC

## 2020-04-24 MED ORDER — AMOXICILLIN-POT CLAVULANATE 875-125 MG PO TABS: 1.0000 | ORAL_TABLET | Freq: Two times a day (BID) | ORAL | Status: DC

## 2020-04-24 NOTE — Progress Notes (Addendum)
Unable to chart against Stiolto inhaler. Spoke with PT this morning and he does not have this medication from home and has does not wish to provide from home. RN aware.

## 2020-04-24 NOTE — Progress Notes (Signed)
PHARMACIST - PHYSICIAN COMMUNICATION DR:   Alfredia Ferguson CONCERNING: Antibiotic IV to Oral Route Change Policy  RECOMMENDATION: This patient is receiving azithromycin by the intravenous route.  Based on criteria approved by the Pharmacy and Therapeutics Committee, the antibiotic(s) is/are being converted to the equivalent oral dose form(s).   DESCRIPTION: These criteria include:  Patient being treated for a respiratory tract infection, urinary tract infection, cellulitis or clostridium difficile associated diarrhea if on metronidazole  The patient is not neutropenic and does not exhibit a GI malabsorption state  The patient is eating (either orally or via tube) and/or has been taking other orally administered medications for a least 24 hours  The patient is improving clinically and has a Tmax < 100.5  If you have questions about this conversion, please contact the Pharmacy Department  []   [phone] )  [facility] []   [phone] )  [facility] []   [phone] )  [facility] []   [phone] )  [facility] [x]   [phone] )  [place], PharmD, BCPS 04/24/2020 9:36 AM

## 2020-04-24 NOTE — Progress Notes (Signed)
Modified Barium Swallow Progress Note  Patient Details  Name: Jeffrey Hines MRN: [id number] Date of Birth: 08-Jun-1945  Today's Date: 04/24/2020  Modified Barium Swallow completed.  Full report located under Chart Review in the Imaging Section.  Brief recommendations include the following:  Clinical Impression Pt presents with functional oral and pharyngeal swallow. Trace vallecular residue was noted after the swallow, likely due to slightly decreased tongue base retraction. Dry swallow effectively cleared residue. No penetration or aspiration was seen on any texture during this study. Results and recommendations were reviewed with pt/wife after this study.    Swallow Evaluation Recommendations  SLP Diet Recommendations: Regular solids;Thin liquid   Liquid Administration via: Cup;Straw   Medication Administration: Whole meds with liquid   Supervision: Patient able to self feed   Compensations: Minimize environmental distractions;Slow rate;Small sips/bites   Oral Care Recommendations: Oral care BID  Kaulin Chaves B. Quentin Ore, [facility], [place] Speech Language Pathologist Office: [phone] Pager: [phone]   Shonna Chock 04/24/2020,12:21 PM

## 2020-04-24 NOTE — Discharge Summary (Signed)
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 remain  in effect (meaning this test can be used) for the duration of the  Covid-19 declaration under Section 564(b)(1) of the Act, 21  U.S.C. section 360bbb-3(b)(1), unless the authorization is  terminated or revoked. Performed at [facility], [place] [address], [place], [place] [address]   Blood culture (routine x 2)     Status: None (Preliminary result)   Collection Time: 04/23/20 12:34 AM   Specimen: BLOOD  Result Value Ref Range Status   Specimen Description   Final    BLOOD LEFT ANTECUBITAL Performed at [facility] [address], [place], [place] [address]    Special Requests   Final    BOTTLES DRAWN AEROBIC AND ANAEROBIC Blood Culture adequate volume Performed at [place] [address] [place], [place] [address]    Culture   Final    NO GROWTH 1 DAY Performed at [facility], [place] [address], [place],  [address]    Report Status PENDING  Incomplete  Blood culture  (routine x 2)     Status: None (Preliminary result)   Collection Time: 04/23/20 12:34 AM   Specimen: BLOOD  Result Value Ref Range Status   Specimen Description   Final    BLOOD RIGHT ANTECUBITAL Performed at [facility] [address], [place],  [address]    Special Requests   Final    BOTTLES DRAWN AEROBIC AND ANAEROBIC Blood Culture adequate volume Performed at [facility]  [address] [place], [place] [address]    Culture   Final    NO GROWTH 1 DAY Performed at [facility], [place] [address], [place], [place] [address]    Report Status PENDING  Incomplete  Culture, sputum-assessment     Status: None   Collection Time: 04/23/20  7:36 AM   Specimen: Expectorated Sputum  Result Value Ref Range Status   Specimen Description EXPECTORATED SPUTUM  Final   Special Requests NONE  Final   Sputum evaluation   Final    THIS SPECIMEN IS ACCEPTABLE FOR SPUTUM CULTURE Performed at [facility], [place] [address] [place], [place] [address]    Report Status 04/23/2020 FINAL  Final  Culture, respiratory     Status: None (Preliminary result)   Collection Time: 04/23/20  7:36 AM  Result Value Ref Range Status   Specimen Description   Final    EXPECTORATED SPUTUM Performed at [name] [address], [place], [place] [address]    Special Requests   Final    NONE Reflexed from [id number] Performed at [facility] [address], [place], [place] [address]    Gram Stain   Final    ABUNDANT WBC PRESENT, PREDOMINANTLY PMN ABUNDANT GRAM NEGATIVE RODS FEW GRAM POSITIVE COCCI IN CHAINS    Culture   Final    CULTURE REINCUBATED FOR BETTER GROWTH Performed at [facility], [place] [address], [place], [place] [address]    Report Status PENDING  Incomplete    Labs: BNP (last 3 results) No results for input(s): BNP in the last 8760 hours. Basic Metabolic Panel: Recent Labs   Lab 04/22/20 2038 04/23/20 0845 04/24/20 0338  NA 136 138 140  K 4.2 4.3 4.3  CL 99 100 103  CO2 26 28 28   GLUCOSE 114* 159* 105*  BUN 16 14 17   CREATININE 0.89 0.82 0.85  CALCIUM 8.8* 8.6* 8.5*  MG  --  2.2 2.1  PHOS  --  3.3 3.1   Liver Function Tests: Recent Labs  Lab 04/22/20 2038 04/23/20 0845 04/24/20 0338  AST 16 15 14*  ALT 12 12 11   ALKPHOS 70 60 59  BILITOT 0.8 0.5 0.3  PROT 8.0 7.4 7.1  ALBUMIN 3.8 3.3* 3.2*   No results for input(s): LIPASE, AMYLASE in the last 168 hours. No results for input(s): AMMONIA in the last 168 hours. CBC: Recent Labs  Lab 04/22/20 2038 04/23/20 0845 04/24/20 0338  WBC 15.6* 9.7 13.3*  NEUTROABS  --  9.0* 11.0*  HGB 13.2 12.2* 11.5*  HCT 42.5 38.5* 36.9*  MCV 85.0 85.2 84.8  PLT 256 212 209   Cardiac Enzymes: No results for input(s): CKTOTAL, CKMB, CKMBINDEX, TROPONINI in the last 168 hours. BNP: Invalid input(s): POCBNP CBG: No results for input(s): GLUCAP in the last 168 hours. D-Dimer Recent Labs    04/23/20 0002  DDIMER 2.00*   Hgb A1c Recent Labs    04/24/20 0338  HGBA1C 5.8*   Lipid Profile No results for input(s): CHOL, HDL, LDLCALC, TRIG, CHOLHDL, LDLDIRECT in the last 72 hours. Thyroid function studies No results for input(s): TSH, T4TOTAL, T3FREE, THYROIDAB in the last 72 hours.  Invalid input(s): FREET3 Anemia work up Recent Labs    04/24/20 0338  VITAMINB12 169*  FOLATE 5.1*  FERRITIN 150  TIBC 241*  IRON 30*  RETICCTPCT 1.1   Urinalysis    Component Value Date/Time   COLORURINE YELLOW 12/11/2014 1504  APPEARANCEUR CLEAR 12/11/2014 1504   LABSPEC 1.030 12/11/2014 1504   PHURINE 6.0 12/11/2014 1504   GLUCOSEU NEGATIVE 12/11/2014 1504   HGBUR NEGATIVE 12/11/2014 1504   BILIRUBINUR NEGATIVE 12/11/2014 1504   KETONESUR NEGATIVE 12/11/2014 1504   PROTEINUR NEGATIVE 12/11/2014 1504   UROBILINOGEN 0.2 12/11/2014 1504   NITRITE NEGATIVE 12/11/2014 1504   LEUKOCYTESUR NEGATIVE  12/11/2014 1504   Sepsis Labs Invalid input(s): PROCALCITONIN,  WBC,  LACTICIDVEN Microbiology Recent Results (from the past 240 hour(s))  Respiratory Panel by RT PCR (Flu A&B, Covid) - Nasopharyngeal Swab     Status: None   Collection Time: 04/23/20 12:02 AM   Specimen: Nasopharyngeal Swab  Result Value Ref Range Status   SARS Coronavirus 2 by RT PCR NEGATIVE NEGATIVE Final    Comment: (NOTE) SARS-CoV-2 target nucleic acids are NOT DETECTED.  The SARS-CoV-2 RNA is generally detectable in upper respiratoy specimens during the acute phase of infection. The lowest concentration of SARS-CoV-2 viral copies this assay can detect is 131 copies/mL. A negative result does not preclude SARS-Cov-2 infection and should not be used as the sole basis for treatment or other patient management decisions. A negative result may occur with  improper specimen collection/handling, submission of specimen other than nasopharyngeal swab, presence of viral mutation(s) within the areas targeted by this assay, and inadequate number of viral copies (<131 copies/mL). A negative result must be combined with clinical observations, patient history, and epidemiological information. The expected result is Negative.  Fact Sheet for Patients:  PinkCheek.be  Fact Sheet for Healthcare Providers:  GravelBags.it  This test is no t yet approved or cleared by the [place] FDA and  has been authorized for detection and/or diagnosis of SARS-CoV-2 by FDA under an Emergency Use Authorization (EUA). This EUA will remain  in effect (meaning this test can be used) for the duration of the COVID-19 declaration under Section 564(b)(1) of the Act, 21 U.S.C. section 360bbb-3(b)(1), unless the authorization is terminated or revoked sooner.     Influenza A by PCR NEGATIVE NEGATIVE Final   Influenza B by PCR NEGATIVE NEGATIVE Final    Comment: (NOTE) The Xpert  Xpress SARS-CoV-2/FLU/RSV assay is intended as an aid in  the diagnosis of influenza from Nasopharyngeal swab specimens and  should not be used as a sole basis for treatment. Nasal washings and  aspirates are unacceptable for Xpert Xpress SARS-CoV-2/FLU/RSV  testing.  Fact Sheet for Patients: PinkCheek.be  Fact Sheet for Healthcare Providers: GravelBags.it  This test is not yet approved or cleared by the [place] FDA and  has been authorized for detection and/or diagnosis of SARS-CoV-2 by  FDA under an Emergency Use Authorization (EUA). This EUA will remain  in effect (meaning this test can be used) for the duration of the  Covid-19 declaration under Section 564(b)(1) of the Act, 21  U.S.C. section 360bbb-3(b)(1), unless the authorization is  terminated or revoked. Performed at [facility], [place] [address], [place], [place] [address]   Blood culture (routine x 2)     Status: None (Preliminary result)   Collection Time: 04/23/20 12:34 AM   Specimen: BLOOD  Result Value Ref Range Status   Specimen Description   Final    BLOOD LEFT ANTECUBITAL Performed at  [address] [place], [place] [address]    Special Requests   Final    BOTTLES DRAWN AEROBIC AND ANAEROBIC Blood Culture adequate volume Performed at [facility] [address] [place],  [place] [address]    Culture   Final    NO GROWTH 1 DAY Performed at [facility], [place] [address], [place], [place] [address]    Report Status PENDING  Incomplete  Blood culture (routine x 2)     Status: None (Preliminary result)   Collection Time: 04/23/20 12:34 AM   Specimen: BLOOD  Result Value Ref Range Status   Specimen Description   Final    BLOOD RIGHT ANTECUBITAL Performed at [facility] [address], [place], [place] [address]    Special Requests   Final     BOTTLES DRAWN AEROBIC AND ANAEROBIC Blood Culture adequate volume Performed at [facility] [address] [place], [place] [address]    Culture   Final    NO GROWTH 1 DAY Performed at [facility], [place] [address] [place], [place] [address]    Report Status PENDING  Incomplete  Culture, sputum-assessment     Status: None   Collection Time: 04/23/20  7:36 AM   Specimen: Expectorated Sputum  Result Value Ref Range Status   Specimen Description EXPECTORATED SPUTUM  Final   Special Requests NONE  Final   Sputum evaluation   Final    THIS SPECIMEN IS ACCEPTABLE FOR SPUTUM CULTURE Performed at [facility], [place] [address], [place], [place] [address]    Report Status 04/23/2020 FINAL  Final  Culture, respiratory     Status: None (Preliminary result)   Collection Time: 04/23/20  7:36 AM  Result Value Ref Range Status   Specimen Description   Final    EXPECTORATED SPUTUM Performed at [facility] [address], [place], [place] [address]    Special Requests   Final    NONE Reflexed from [id number] Performed at [facility] [address] [place], [place] [address]    Gram Stain   Final    ABUNDANT WBC PRESENT, PREDOMINANTLY PMN ABUNDANT GRAM NEGATIVE RODS FEW GRAM POSITIVE COCCI IN CHAINS    Culture   Final    CULTURE REINCUBATED FOR BETTER GROWTH Performed at [facility], [place] [address] [place],  [address]    Report Status PENDING  Incomplete   Time coordinating discharge: 35 minutes  SIGNED:  Kerney Elbe, DO [facility] 04/24/2020, 7:08 PM Pager is on [place]  If 7PM-7AM, please contact night-coverage [email or web address]
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
## 2020-04-25 LAB — LEGIONELLA PNEUMOPHILA SEROGP 1 UR AG: L. pneumophila Serogp 1 Ur Ag: NEGATIVE

## 2020-04-25 LAB — CULTURE, RESPIRATORY W GRAM STAIN: Culture: NORMAL

## 2020-04-28 LAB — CULTURE, BLOOD (ROUTINE X 2)
Culture: NO GROWTH
Culture: NO GROWTH
Special Requests: ADEQUATE
Special Requests: ADEQUATE

## 2020-05-01 ENCOUNTER — Encounter: Payer: Self-pay | Admitting: Pulmonary Disease

## 2020-05-01 ENCOUNTER — Ambulatory Visit (INDEPENDENT_AMBULATORY_CARE_PROVIDER_SITE_OTHER): Payer: Medicare Other | Admitting: Pulmonary Disease

## 2020-05-01 ENCOUNTER — Other Ambulatory Visit: Payer: Self-pay

## 2020-05-01 VITALS — BP 112/64 | HR 99 | Temp 97.0°F | Ht 71.0 in | Wt 160.8 lb

## 2020-05-01 DIAGNOSIS — J151 Pneumonia due to Pseudomonas: Secondary | ICD-10-CM

## 2020-05-01 DIAGNOSIS — J9611 Chronic respiratory failure with hypoxia: Secondary | ICD-10-CM | POA: Diagnosis not present

## 2020-05-01 DIAGNOSIS — C3492 Malignant neoplasm of unspecified part of left bronchus or lung: Secondary | ICD-10-CM | POA: Diagnosis not present

## 2020-05-01 DIAGNOSIS — J471 Bronchiectasis with (acute) exacerbation: Secondary | ICD-10-CM | POA: Diagnosis not present

## 2020-05-01 DIAGNOSIS — Z0000 Encounter for general adult medical examination without abnormal findings: Secondary | ICD-10-CM | POA: Insufficient documentation

## 2020-05-01 MED ORDER — DOXYCYCLINE HYCLATE 100 MG PO TABS: 100.0000 mg | ORAL_TABLET | Freq: Two times a day (BID) | ORAL | 0 refills | Status: DC

## 2020-05-01 NOTE — Assessment & Plan Note (Signed)
Currently recovering from a pneumonia requiring hospitalization  Plan: Continue hypertonic saline nebs, at a minimum 2 times daily, would encourage patient to increase to 3-4 times daily if able to tolerate Would encourage using albuterol Saba nebulizer twice daily prior to hypertonic saline nebs use Continue flutter valve after hypertonic saline use Consider starting therapy vest back at least to 1 time daily if able to tolerate Close follow-up with Dr. Silas Flood next month

## 2020-05-01 NOTE — Assessment & Plan Note (Signed)
Plan: Continue oxygen therapy 

## 2020-05-01 NOTE — Assessment & Plan Note (Signed)
Plan: Continue follow-up with oncology

## 2020-05-01 NOTE — Assessment & Plan Note (Signed)
Up-to-date with flu vaccine Received first 2 COVID-19 vaccinations  Plan: Obtain COVID-19 booster when back to baseline

## 2020-05-01 NOTE — Patient Instructions (Addendum)
You were seen today by Lauraine Rinne, NP  for:   It was nice seeing you today.  Hope he had a very happy Thanksgiving.  I am glad you are doing well out of the hospital.  We will keep a close eye on you and see you back next month.  I will discuss her case with Dr. Silas Flood to see what his thoughts are on cyclical antibiotics.  As discussed today at length there are risk factors for being on cyclical antibiotics which could include growing multidrug-resistant organisms such as the Pseudomonas that we suspect is colonized.  If this does happen it could reduce our ability to treat you in the outpatient setting.  Increase her hypertonic saline nebs use to 3-4 times a day.  Try to utilize your albuterol nebulized meds 2 times daily before hypertonic saline nebs.  Continue flutter valve.  If you are feeling up to it also try to use your therapy vest.  Take care and stay safe.  Have a very happy holidays  Aaron Edelman  1. Bronchiectasis with (acute) exacerbation ([place]) 2. Pneumonia due to Pseudomonas species, unspecified laterality, unspecified part of lung ([place])  We will provide sputum cups today if you start coughing up more mucus please provide 2 samples  We will get you scheduled for close follow-up with Dr. Silas Flood over the next month.  We will discuss with him whether or not he wants to put you back on cyclical antibiotics  Bronchiectasis: This is the medical term which indicates that you have damage, dilated airways making you more susceptible to respiratory infection. Use a flutter valve 10 breaths three times a day or 4 to 5 breaths 5-6 times a day to help clear mucus out Let us know if you have cough with change in mucus color or fevers or chills.  At that point you would need an antibiotic. Maintain a healthy nutritious diet, eating whole foods Take your medications as prescribed   Continue hypertonic saline nebs >>> Please try to increase hypertonic saline nebs use to 3-4 times a day if  able, at a minimum please do 2 times a day as previously outlined  Please utilize your albuterol nebulized meds 2 times daily prior to hypertonic saline nebs  Please continue probiotics  3. Chronic respiratory failure with hypoxia (H[facility])  Continue oxygen therapy as prescribed  >>>maintain oxygen saturations greater than 88 percent  >>>if unable to maintain oxygen saturations please contact the office  >>>do not smoke with oxygen  >>>can use nasal saline gel or nasal saline rinses to moisturize nose if oxygen causes drynes  4. Malignant neoplasm of upper lobe of left lung ([place])  Continue follow-up with Dr. Earlie Server  5. Healthcare maintenance  When you feel like you are returning to baseline and feeling okay please obtain the COVID-19 booster vaccination  Please be aware you may feel achy, flulike symptoms for 24 to 48 hours afterwards   Please hydrate well  Please take Mucinex daily for suspected constipation   Follow Up:    Return in about 4 weeks (around 05/29/2020), or if symptoms worsen or fail to improve, for Follow up with Dr. Silas Flood.   Notification of test results are managed in the following manner: If there are  any recommendations or changes to the  plan of care discussed in office today,  we will contact you and let you know what they are. If you do not hear from [name], then your results are normal and you can view  them through your  MyChart account , or a letter will be sent to you. Thank you again for trusting [name] with your care  - Thank you, [facility]    It is flu season:   >>> Best ways to protect herself from the flu: Receive the yearly flu vaccine, practice good hand hygiene washing with soap and also using hand sanitizer when available, eat a nutritious meals, get adequate rest, hydrate appropriately       Please contact the office if your symptoms worsen or you have concerns that you are not improving.   Thank you for choosing [facility] for your healthcare, and for allowing [name] to partner with you on your healthcare journey. I am thankful to be able to provide care to you today.   [name] FNP-C

## 2020-05-01 NOTE — Progress Notes (Signed)
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 Inj Mdck Quad Pf 03/01/2018  . Influenza Split 04/14/2015  . Influenza Whole 02/27/2014  . Influenza, High Dose Seasonal PF 06/02/2016, 04/06/2017, 02/19/2019  . Influenza-Unspecified 12/27/2019  . Moderna SARS-COVID-2 Vaccination 07/09/2019, 08/06/2019  . Pneumococcal Conjugate-13 04/14/2015  . Pneumococcal Polysaccharide-23 05/03/2016    Past Medical History:  Diagnosis Date  . Anxiety   . Cancer ([place])   . Chronic lower back pain   . Closed head injury 1998  . Constipation due to opioid therapy   . COPD (chronic obstructive pulmonary disease) ([place])   . COPD with chronic bronchitis ([place])   . Full dentures   . GERD (gastroesophageal reflux disease)   . Hemoptysis 08/03/2014  . Hypertension   . Multiple rib fractures 1998   left side  . On supplemental oxygen therapy    2L of O2 at night  . Post-obstruction pneumonia due to Pseudomonas aeruginosa 08/07/2014   Endobronchial mass causing LUL obstruction  . Radiation 08/25/14-09/29/14   left upper central lung 45 Gy  . Seizures ([place])   . Shortness of breath dyspnea   . Shoulder dislocation  1998   left  . Spontaneous pneumothorax 08/03/2014   Left side 1st time episode spontaneous pneumothorax associated with acute flare of COPD   . Tobacco abuse     Tobacco History: Social History   Tobacco Use  Smoking Status Former Smoker  . Packs/day: 0.50  . Years: 50.00  . Pack years: 25.00  . Types: Cigarettes  . Quit date: 08/03/2014  . Years since quitting: 5.7  Smokeless Tobacco Never Used   Counseling given: Not Answered   Continue to not smoke  Outpatient Encounter Medications as of 05/01/2020  Medication Sig  . albuterol (PROVENTIL) (2.5 MG/3ML) 0.083% nebulizer solution Take 3 mLs (2.5 mg total) by nebulization every 2 (two) hours as needed for wheezing or shortness of breath.  Marland Kitchen Bioflavonoid Products (BIOFLEX PO) Take 1 tablet by mouth 2 (two) times daily.   . [place]-Glucosamine-Vit D (OSTEO BI-FLEX ONE PER DAY) TABS Take 1 tablet by mouth in the morning and at bedtime.  . clonazePAM (KLONOPIN) 0.5 MG tablet Take 0.5 mg by mouth 2 (two) times daily.   . furosemide (LASIX) 20 MG tablet Take 1 tablet (20 mg total) by mouth daily.  Marland Kitchen guaiFENesin (MUCINEX) 600 MG 12 hr tablet Take 1 tablet (600 mg total) by mouth 2 (two) times daily.  Marland Kitchen loratadine (CLARITIN) 10 MG tablet Take 10 mg by mouth in the morning and at bedtime.   . polyethylene glycol (MIRALAX / GLYCOLAX) 17 g packet Take 17 g by mouth 2 (two) times daily. (Patient taking differently: Take 17 g by mouth daily as needed. )  . Probiotic Product (PROBIOTIC-10 PO) Take by mouth at bedtime.  Marland Kitchen Respiratory Therapy Supplies (FLUTTER) DEVI 1 Device by Does not apply route as directed.  Marland Kitchen rOPINIRole (REQUIP) 1 MG tablet Take 1 mg by mouth at bedtime.  . Saw Palmetto 450 MG CAPS Take 3 capsules by mouth in the morning and at bedtime. Take 2 capsules in the morning and Take 1 capsule at bedtime  . sodium chloride HYPERTONIC 3 % nebulizer solution Take 4 mLs by nebulization 2 (two) times daily as needed for cough.   .  SUMAtriptan (IMITREX) 25 MG tablet Take 1 tablet (25 mg total) by mouth 2 (two) times daily as needed for migraine or headache. May repeat in 2 hours if headache persists or recurs.  . tamsulosin (FLOMAX) 0.4 MG CAPS capsule TAKE 1 CAPSULE BY MOUTH EVERY DAY (Patient taking differently: Take 0.4 mg by mouth daily. )  . Tiotropium Bromide-Olodaterol (STIOLTO RESPIMAT) 2.5-2.5 MCG/ACT AERS Inhale 2 puffs into the lungs daily.   . Turmeric (QC TUMERIC COMPLEX) 500 MG CAPS Take 1 capsule by mouth in the morning and at bedtime.   . [DISCONTINUED] rOPINIRole (REQUIP) 0.5 MG tablet Take 1 tablet (0.5 mg total) by mouth at bedtime.   No facility-administered encounter medications on file as of 05/01/2020.     Review of Systems  Review of Systems  Constitutional: Positive for fatigue. Negative for activity change, chills, fever and unexpected weight change.  HENT: Positive for congestion. Negative for postnasal drip, rhinorrhea, sinus pressure, sinus pain and sore throat.   Eyes: Negative.   Respiratory: Positive for cough and shortness of breath. Negative for wheezing.   Cardiovascular: Negative for chest pain and palpitations.  Gastrointestinal: Positive for constipation. Negative for diarrhea, nausea and vomiting.  Endocrine: Negative.   Genitourinary: Negative.   Musculoskeletal: Negative.   Skin: Negative.   Neurological: Negative for dizziness and headaches.  Psychiatric/Behavioral: Negative.  Negative for dysphoric mood. The patient is not nervous/anxious.   All other systems reviewed and are negative.    Physical Exam  BP 112/64 (BP Location: Left Arm, Cuff Size: Normal)   Pulse 99   Temp (!) 97 F (36.1 C) (Temporal)   Ht 5\' 11"  (1.803 m)   Wt 160 lb 12.8 oz (72.9 kg)   SpO2 98% Comment: 3lpm pulsed o2  BMI 22.43 kg/m   Wt Readings from Last 5 Encounters:  05/01/20 160 lb 12.8 oz (72.9 kg)  04/23/20 166 lb 3.2 oz (75.4 kg)  04/20/20 166 lb 3.2 oz (75.4 kg)  03/20/20 164 lb  3.2 oz (74.5 kg)  02/21/20 168 lb 9.6 oz (76.5 kg)    BMI Readings from Last 5 Encounters:  05/01/20 22.43 kg/m  04/23/20 23.18 kg/m  04/20/20 23.18 kg/m  03/20/20 22.90 kg/m  02/21/20 23.51 kg/m     Physical Exam Vitals and nursing note reviewed.  Constitutional:      General: He is not in acute distress.    Appearance: Normal appearance.     Comments: Thin frail adult male  HENT:     Head: Normocephalic and atraumatic.     Right Ear: Hearing, tympanic membrane, ear canal and external ear normal. There is no impacted cerumen.     Left Ear: Hearing, tympanic membrane, ear canal and external ear normal. There is no impacted cerumen.     Nose: Congestion (Left nare) and rhinorrhea present. No mucosal edema. Rhinorrhea is clear.     Right Turbinates: Not enlarged.     Left Turbinates: Not enlarged.     Mouth/Throat:     Mouth: Mucous membranes are dry.     Pharynx: Oropharynx is clear. No oropharyngeal exudate.  Eyes:     Pupils: Pupils are equal, round, and reactive to light.  Cardiovascular:     Rate and Rhythm: Normal rate and regular rhythm.     Pulses: Normal pulses.     Heart sounds: Normal heart sounds. No murmur heard.   Pulmonary:     Effort: Pulmonary effort is normal.     Breath sounds: No decreased breath sounds, wheezing or rales.     Comments: Scattered squeaks, diminished breath sounds throughout exam Musculoskeletal:     Cervical back: Normal range of motion.     Right lower leg: No edema.     Left lower leg: No edema.  Lymphadenopathy:     Cervical: No cervical adenopathy.  Skin:    General: Skin is warm and dry.     Capillary Refill: Capillary refill takes less than 2 seconds.     Findings: No erythema or rash.  Neurological:     General: No focal deficit present.     Mental Status: He is alert and oriented to person, place, and time.     Motor: No weakness.     Coordination: Coordination normal.     Gait: Gait is intact. Gait normal.    Psychiatric:        Mood and Affect: Mood normal.        Behavior: Behavior normal. Behavior is cooperative.        Thought Content: Thought content normal.        Judgment: Judgment normal.       Assessment & Plan:   Discussion: Had extensive discussion with patient as well as his spouse regarding management of bronchiectasis.  Patient previously been established with cyclical antibiotics.  I do not believe is unreasonable to resume this.  I did explain that this is not a guarantee to keep him out of the hospital as data with this intervention is quite limited especially non-CF bronchiectasis.  I explained to them that I would discuss the case with Dr. Silas Flood if he is agreeable to placing the patient back on cyclical antibiotics then we could consider this.  There is a risk the patient is developing resistance to the Augmentin/doxycycline rotating monthly antibiotic combo that he was previously on.  Especially given the fact that he had recently completed Augmentin about 10 days before  his most recent hospitalization where showed a pneumonia.  I explained multiple times that if this does happen and we develop a multidrug-resistant organism especially given his history of Pseudomonas this could limit our management in the outpatient setting and patient may require IV antibiotics or referral to infectious disease.  I discussed the case with Dr. Silas Flood.  He is agreeable for the patient to resume doxycycline next month.  We will tentatively plan for doxycycline start around December/15/2021.  We will also tentatively plan on having the patient follow-up next month with Dr. Silas Flood to closely monitor.  Can consider chest x-ray at that time.  If patient should require additional hospitalization I would highly recommend that PCCM/pulmonary is consulted.  Bronchiectasis with (acute) exacerbation (HCC) Currently recovering from a pneumonia requiring hospitalization  Plan: Continue  hypertonic saline nebs, at a minimum 2 times daily, would encourage patient to increase to 3-4 times daily if able to tolerate Would encourage using albuterol Saba nebulizer twice daily prior to hypertonic saline nebs use Continue flutter valve after hypertonic saline use Consider starting therapy vest back at least to 1 time daily if able to tolerate Close follow-up with Dr. Silas Flood next month  Chronic respiratory failure with hypoxia ([place]) Plan: Continue oxygen therapy  Squamous cell carcinoma of lung, stage II ([place]) Plan: Continue follow-up with oncology  Healthcare maintenance Up-to-date with flu vaccine Received first 2 COVID-19 vaccinations  Plan: Obtain COVID-19 booster when back to baseline    Return in about 4 weeks (around 05/29/2020), or if symptoms worsen or fail to improve, for Follow up with Dr. Silas Flood.  Addendum: 05/01/2020 I have contacted the patient left a voicemail reporting that they can start back on doxycycline on 05/20/2020.   Lauraine Rinne, NP 05/01/2020   This appointment required 52 minutes of patient care (this includes precharting, chart review, review of results, face-to-face care, etc.).

## 2020-05-25 ENCOUNTER — Encounter: Payer: Self-pay | Admitting: Pulmonary Disease

## 2020-05-25 ENCOUNTER — Ambulatory Visit (INDEPENDENT_AMBULATORY_CARE_PROVIDER_SITE_OTHER): Payer: Medicare Other | Admitting: Pulmonary Disease

## 2020-05-25 ENCOUNTER — Other Ambulatory Visit: Payer: Self-pay

## 2020-05-25 VITALS — BP 114/72 | HR 94 | Temp 97.1°F | Ht 71.0 in | Wt 161.8 lb

## 2020-05-25 DIAGNOSIS — J449 Chronic obstructive pulmonary disease, unspecified: Secondary | ICD-10-CM

## 2020-05-25 DIAGNOSIS — J479 Bronchiectasis, uncomplicated: Secondary | ICD-10-CM | POA: Diagnosis not present

## 2020-05-25 DIAGNOSIS — J9611 Chronic respiratory failure with hypoxia: Secondary | ICD-10-CM | POA: Diagnosis not present

## 2020-05-25 MED ORDER — PREDNISONE 20 MG PO TABS: 40.0000 mg | ORAL_TABLET | Freq: Every day | ORAL | 0 refills | Status: AC

## 2020-05-25 MED ORDER — POLYETHYLENE GLYCOL 3350 17 G PO PACK
17.0000 g | PACK | Freq: Every day | ORAL | 0 refills | Status: DC
Start: 2020-05-25 — End: 2020-10-19

## 2020-05-25 NOTE — Patient Instructions (Signed)
Nice to see you  Prednisone 40 mg a day to help with wheezing, shortness of breath. Use for 5 days  Referral to pulmonary rehab - I think this will help  Return to clinic in 3 months with Dr. Silas Flood

## 2020-05-26 NOTE — Progress Notes (Signed)
@Patient  ID: Jeffrey Hines, male    DOB: June 15, 1945, 74 y.o.   MRN: [id number]  Chief Complaint  Patient presents with  . Follow-up    1 mo f/u, states he started the antibiotics on the 7th instead of the 15th due to high fever.     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    05/26/2020  - Visit  Hospitalized at my counsel 04/2020 given reported worsening hypoxemia at home. Note from hospitalization reviewed. CT showed infiltrate consistent with pneumonia on my interpretation. Started on abx and subsequently discharged. Continues his rotating prophylactic abx. Feels he is getting worse overall. Can not do things he wants to do due to DOE. Wife agrees. Feels like each exacerbation, hospitalization he takes a big step back.    Questionaires / Pulmonary Flowsheets:   ACT:  No flowsheet data found.  MMRC: No flowsheet data found.  [place]worth:  No flowsheet data found.  Tests:   FENO:  No results found for: NITRICOXIDE  PFT: PFT Results Latest Ref Rng & Units 11/07/2014 08/07/2014  FVC-Pre L 4.89 2.93  FVC-Predicted Pre % 104 62  FVC-Post L 5.38 3.44  FVC-Predicted Post % 115 73  Pre FEV1/FVC % % 46 49  Post FEV1/FCV % % 46 50  FEV1-Pre L 2.24 1.44  FEV1-Predicted Pre % 64 41  FEV1-Post L 2.47 1.71  DLCO uncorrected ml/min/mmHg 10.62 11.13  DLCO UNC% % 31 33  DLCO corrected ml/min/mmHg 11.70 11.29  DLCO COR %Predicted % 34 33  DLVA Predicted % 40 55  TLC L 7.79 6.73  TLC % Predicted % 107 93  RV % Predicted % 101 131    WALK:  SIX MIN WALK 04/28/2015 10/28/2014  Supplimental Oxygen during Test? (L/min) No No  Tech Comments: - pt walked a slow pace, tolerated walk well.     Imaging: Reviewed and as per EMR  and discussion in this note  Lab Results: Extensively reviewed including multiple sputum and AFB cultures CBC    Component Value Date/Time   WBC 13.3 (H) 04/24/2020 0338   RBC 4.35 04/24/2020 0338   RBC 4.30 04/24/2020 0338   HGB 11.5 (L) 04/24/2020 0338   HGB 12.5 (L) 04/17/2020 1301   HGB 12.9 (L) 02/08/2017 1343   HCT 36.9 (L) 04/24/2020 0338   HCT 38.9 02/08/2017 1343   PLT 209 04/24/2020 0338   PLT 269 04/17/2020 1301   PLT 218 02/08/2017 1343   MCV 84.8 04/24/2020 0338   MCV 85.7 02/08/2017 1343   MCH 26.4 04/24/2020 0338   MCHC 31.2 04/24/2020 0338   RDW 14.6 04/24/2020 0338   RDW 12.8 02/08/2017 1343   LYMPHSABS 1.0 04/24/2020 0338   LYMPHSABS 0.9 02/08/2017 1343   MONOABS 1.3 (H) 04/24/2020 0338   MONOABS 0.5 02/08/2017 1343   EOSABS 0.0 04/24/2020 0338   EOSABS 0.1 02/08/2017 1343   BASOSABS 0.0 04/24/2020 0338   BASOSABS 0.0 02/08/2017 1343    BMET    Component Value Date/Time   NA 140 04/24/2020 0338   NA 142 02/08/2017 1344   K 4.3 04/24/2020 0338   K 3.5 02/08/2017 1344   CL 103 04/24/2020 0338   CO2 28 04/24/2020 0338   CO2 28 02/08/2017 1344   GLUCOSE 105 (H) 04/24/2020  5597   GLUCOSE 113 02/08/2017 1344   BUN 17 04/24/2020 0338   BUN 13.5 02/08/2017 1344   CREATININE 0.85 04/24/2020 0338   CREATININE 1.02 04/17/2020 1301   CREATININE 0.9 02/08/2017 1344   CALCIUM 8.5 (L) 04/24/2020 0338   CALCIUM 9.0 02/08/2017 1344   GFRNONAA >60 04/24/2020 0338   GFRNONAA >60 04/17/2020 1301   GFRAA >60 01/17/2020 1355    BNP    Component Value Date/Time   BNP 30.5 08/03/2014 1359    ProBNP    Component Value Date/Time   PROBNP 101.0 (H) 01/07/2020 1641    Specialty Problems      Pulmonary Problems   COPD with chronic bronchitis ([place])    March 2016 pulmonary function test> ratio 50%, FEV1 L (49% predicted, 18% change with bronchodilator), total lung capacity 6.73 L (93% predicted), DLCO 11.13 (33% predicted). June 2016 pulmonary function  testing ratio 46%, FEV1 2.47 L (71% Pred), total lung capacity 7.79 L (107% predicted), DLCO 10.62 (31% predicted). 11/2014 ONO < 88% 2 hours      Lung mass   Post-obstruction pneumonia due to Pseudomonas aeruginosa    Endobronchial mass causing LUL obstruction      Squamous cell carcinoma of lung, stage II ([place])    - Status post left upper lobectomy summer 2016 - seen at [facility] and underwent a bronchoscopy on March 16 2017 with a balloon dilation of the focal area of acquired bronchomalacia and stenosis of the left lower lobe orifice this was felt due to torsion of the lung post lobectomy       Chronic respiratory failure with hypoxia (HCC)    2L O2 qHS 05/2015 ONO RA> less than 88% 1 hour, 31 minutes, lowest 77%      Lung cancer (HCC)   Cough   Community acquired pneumonia of left lung   Hypoxia   Pneumonia due to Pseudomonas species ([place])   Postobstructive pneumonia   Bronchiectasis with (acute) exacerbation ([place])   Bronchiectasis without complication ([place])   CAP (community acquired pneumonia)   Pneumonia      Allergies  Allergen Reactions  . Anoro Ellipta [Umeclidinium-Vilanterol] Hives  . Prolixin [Fluphenazine]     Severe back pain  . Advair Diskus [Fluticasone-Salmeterol] Anxiety    Immunization History  Administered Date(s) Administered  . Fluad Quad(high Dose 65+) 03/20/2020  . Influenza Inj Mdck Quad Pf 03/01/2018  . Influenza Split 04/14/2015  . Influenza Whole 02/27/2014  . Influenza, High Dose Seasonal PF 06/02/2016, 04/06/2017, 02/19/2019  . Influenza-Unspecified 12/27/2019  . Moderna Sars-Covid-2 Vaccination 07/09/2019, 08/06/2019  . Pneumococcal Conjugate-13 04/14/2015  . Pneumococcal Polysaccharide-23 05/03/2016    Past Medical History:  Diagnosis Date  . Anxiety   . Cancer ([place])   . Chronic lower back pain   . Closed head injury 1998  . Constipation due to opioid therapy   . COPD (chronic obstructive pulmonary disease) ([place])    . COPD with chronic bronchitis ([place])   . Full dentures   . GERD (gastroesophageal reflux disease)   . Hemoptysis 08/03/2014  . Hypertension   . Multiple rib fractures 1998   left side  . On supplemental oxygen therapy    2L of O2 at night  . Post-obstruction pneumonia due to Pseudomonas aeruginosa 08/07/2014   Endobronchial mass causing LUL obstruction  . Radiation 08/25/14-09/29/14   left upper central lung 45 Gy  . Seizures ([place])   . Shortness of breath dyspnea   . Shoulder dislocation 1998  left  . Spontaneous pneumothorax 08/03/2014   Left side 1st time episode spontaneous pneumothorax associated with acute flare of COPD   . Tobacco abuse     Tobacco History: Social History   Tobacco Use  Smoking Status Former Smoker  . Packs/day: 0.50  . Years: 50.00  . Pack years: 25.00  . Types: Cigarettes  . Quit date: 08/03/2014  . Years since quitting: 5.8  Smokeless Tobacco Never Used   Counseling given: Not Answered   Continue to not smoke  Outpatient Encounter Medications as of 05/25/2020  Medication Sig  . albuterol (PROVENTIL) (2.5 MG/3ML) 0.083% nebulizer solution Take 3 mLs (2.5 mg total) by nebulization every 2 (two) hours as needed for wheezing or shortness of breath.  Marland Kitchen Bioflavonoid Products (BIOFLEX PO) Take 1 tablet by mouth 2 (two) times daily.   . [place]-Glucosamine-Vit D (OSTEO BI-FLEX ONE PER DAY) TABS Take 1 tablet by mouth in the morning and at bedtime.  . clonazePAM (KLONOPIN) 0.5 MG tablet Take 0.5 mg by mouth 2 (two) times daily.   Marland Kitchen doxycycline (VIBRA-TABS) 100 MG tablet Take 1 tablet (100 mg total) by mouth 2 (two) times daily.  . furosemide (LASIX) 20 MG tablet Take 1 tablet (20 mg total) by mouth daily.  Marland Kitchen guaiFENesin (MUCINEX) 600 MG 12 hr tablet Take 1 tablet (600 mg total) by mouth 2 (two) times daily.  Marland Kitchen loratadine (CLARITIN) 10 MG tablet Take 10 mg by mouth in the morning and at bedtime.   . Probiotic Product (PROBIOTIC-10 PO) Take by mouth at  bedtime.  Marland Kitchen Respiratory Therapy Supplies (FLUTTER) DEVI 1 Device by Does not apply route as directed.  Marland Kitchen rOPINIRole (REQUIP) 1 MG tablet Take 1 mg by mouth at bedtime.  . Saw Palmetto 450 MG CAPS Take 3 capsules by mouth in the morning and at bedtime. Take 2 capsules in the morning and Take 1 capsule at bedtime  . sodium chloride HYPERTONIC 3 % nebulizer solution Take 4 mLs by nebulization 2 (two) times daily as needed for cough.   . SUMAtriptan (IMITREX) 25 MG tablet Take 1 tablet (25 mg total) by mouth 2 (two) times daily as needed for migraine or headache. May repeat in 2 hours if headache persists or recurs.  . tamsulosin (FLOMAX) 0.4 MG CAPS capsule TAKE 1 CAPSULE BY MOUTH EVERY DAY (Patient taking differently: Take 0.4 mg by mouth daily.)  . Tiotropium Bromide-Olodaterol (STIOLTO RESPIMAT) 2.5-2.5 MCG/ACT AERS Inhale 2 puffs into the lungs daily.   . Turmeric 500 MG CAPS Take 1 capsule by mouth in the morning and at bedtime.   . [DISCONTINUED] polyethylene glycol (MIRALAX / GLYCOLAX) 17 g packet Take 17 g by mouth 2 (two) times daily. (Patient taking differently: Take 17 g by mouth daily as needed.)  . polyethylene glycol (MIRALAX / GLYCOLAX) 17 g packet Take 17 g by mouth daily.  . predniSONE (DELTASONE) 20 MG tablet Take 2 tablets (40 mg total) by mouth daily with breakfast for 5 days.   No facility-administered encounter medications on file as of 05/25/2020.      Physical Exam  BP 114/72   Pulse 94   Temp (!) 97.1 F (36.2 C) (Temporal)   Ht 5\' 11"  (1.803 m)   Wt 161 lb 12.8 oz (73.4 kg)   SpO2 100% Comment: on 3L  BMI 22.57 kg/m   Wt Readings from Last 5 Encounters:  05/25/20 161 lb 12.8 oz (73.4 kg)  05/01/20 160 lb 12.8 oz (72.9 kg)  04/23/20  166 lb 3.2 oz (75.4 kg)  04/20/20 166 lb 3.2 oz (75.4 kg)  03/20/20 164 lb 3.2 oz (74.5 kg)    BMI Readings from Last 5 Encounters:  05/25/20 22.57 kg/m  05/01/20 22.43 kg/m  04/23/20 23.18 kg/m  04/20/20 23.18 kg/m   03/20/20 22.90 kg/m     Physical Exam Gen: chronically ill appearing, in NAD Respiratory: Inspiratory crackles low to mid lung fields, diffuse wheeze, NWOB on 3L [place] CV: tachy, regular rhythm   Assessment & Plan:   Bronchiectasis: Has grown Psa in past. Occasional odd NTM grown in culture but smear negative. Most recent culture 08/2019 negative. Most recent 2021 and species could nto be identified on DNA probe, false positive?Marland Kitchen Recent hospitalization 04/2020 with pna, culture with OP flora. --Continue HTS BID, albuterol BID, fluter valve BID --Increase to QID when having exacerbation --Sputum and AFB sample at times of exacerbation or at least once yearly  Severe COPD: Related to cigarette use and bronchiectasis. Very wheezy on exam. --Continue stiolto for dual bronchodilation, tolerate perceived side effects of low back pain, consider nebulized LAMA/LABA in future (VA benefit) --Prednisone daily x 5 days for wheeze  Chronic hypoxemic respiratory failure:3L. Related to above. Stable.  GOC: Overall, gradual worsening in condition. Each exacerbation, Weight loss, evidence of pulmonary cachexia. Has advanced directive in place that reportedly states would not want to live on life support. Agree with this. Recommend DNR. Not ready for hospice services, wants to improve. Will continue with aggressive medical therapy. Wants to improve stamina. -Referral to pulmonary rehab  Return in about 3 months (around 08/23/2020).   Lanier Clam, MD 05/26/2020   This appointment required 45 minutes of patient care (this includes precharting, chart review, review of results, face-to-face care, etc.).

## 2020-06-01 ENCOUNTER — Telehealth (HOSPITAL_COMMUNITY): Payer: Self-pay

## 2020-06-01 NOTE — Telephone Encounter (Signed)
Called pt to see if he wanted to come to [place] for pulmonary rehab considering he lives in [place] and also to see if he wanted to use his [place] insurance or his [employer] and [employer]. Left message for pt to call back.

## 2020-06-03 ENCOUNTER — Telehealth (HOSPITAL_COMMUNITY): Payer: Self-pay

## 2020-06-03 NOTE — Telephone Encounter (Signed)
Pt wife called and stated that pt is interested in the cardiac rehab program and that he wanted to come to [place] cardiac rehab and that he wanted to use his medicare and [place] insurance and didn't want to use the [employer]. Advised pt wife that I would check his insurance at the beginning of the new year and we would contact him to schedule.

## 2020-06-18 ENCOUNTER — Telehealth (HOSPITAL_COMMUNITY): Payer: Self-pay

## 2020-07-02 NOTE — Telephone Encounter (Signed)
Pt insurance is active and benefits verified through Medicare a/b Co-pay 0, DED $233/$233 met, out of pocket 0/0 met, co-insurance 20%. no pre-authorization required.   2ndary insurance is active and benefits verified through [employer]. Co-pay 0, DED 0/0 met, out of pocket $6,000/$2.00 met, co-insurance 0%. No pre-authorization required. Passport, [place]/[employer] 07/02/2020@9 :22am, REF# [id number]

## 2020-07-10 ENCOUNTER — Encounter (HOSPITAL_COMMUNITY): Payer: Self-pay

## 2020-07-16 ENCOUNTER — Telehealth: Payer: Self-pay | Admitting: Pulmonary Disease

## 2020-07-16 NOTE — Telephone Encounter (Signed)
lmtcb for pt.  

## 2020-07-16 NOTE — Telephone Encounter (Signed)
LMTCB

## 2020-07-17 MED ORDER — MOXIFLOXACIN HCL 400 MG PO TABS: 400.0000 mg | ORAL_TABLET | Freq: Every day | ORAL | 0 refills | Status: AC

## 2020-07-17 NOTE — Telephone Encounter (Signed)
Patient reports a temp of 95.1. BP 109/69. Oxygen  96% on 3L. Reports SOB that started 07/03/20 that has gradually gotten worse. Reports intermittent fevers ranging from 100 to 102.4. He reports right sided rib pain. He reports the pain is so intense it feels like someone broke his ribs. Reports cough with minimal phlegm. He is using his nebulizer once daily. He has not been tested for covid. He reports he has not been out of his home for the last 2 weeks. He confirms he is using his stiolto daily and uses his flutter valve when time allows.   MH please advise.   Thanks

## 2020-07-17 NOTE — Telephone Encounter (Signed)
patient has been called 3 times no call back. Will close encounter per protocol

## 2020-07-17 NOTE — Telephone Encounter (Signed)
Patient is aware of recommendations and voiced his understanding.  Provided patient with testing site information.  Patient will call back with results.  Rx for Moxifloxacin 400mg  has been sent to preferred pharmacy.  Nothing further needed at this time.

## 2020-07-17 NOTE — Telephone Encounter (Signed)
Lm for patient.  

## 2020-07-17 NOTE — Addendum Note (Signed)
Addended by: Claudette Head A on: 07/17/2020 03:00 PM   Modules accepted: Orders

## 2020-07-17 NOTE — Telephone Encounter (Signed)
Moxifloxacin 400 mg daily x 14 days. Recommend he increase albuterol and hypertonic saline to 4 times a day. Recommend covid test. If Negative he needs a CXR.

## 2020-07-17 NOTE — Telephone Encounter (Signed)
LMTCB and will hold to try again later due to nature of call

## 2020-07-27 ENCOUNTER — Telehealth (HOSPITAL_COMMUNITY): Payer: Self-pay

## 2020-07-27 NOTE — Telephone Encounter (Signed)
No response from pt.  Closed referral  

## 2020-08-27 ENCOUNTER — Encounter: Payer: Self-pay | Admitting: Pulmonary Disease

## 2020-08-27 ENCOUNTER — Other Ambulatory Visit: Payer: Self-pay

## 2020-08-27 ENCOUNTER — Ambulatory Visit (INDEPENDENT_AMBULATORY_CARE_PROVIDER_SITE_OTHER): Payer: Medicare Other | Admitting: Pulmonary Disease

## 2020-08-27 VITALS — BP 116/72 | HR 112 | Temp 98.1°F | Ht 71.0 in | Wt 159.0 lb

## 2020-08-27 DIAGNOSIS — J9611 Chronic respiratory failure with hypoxia: Secondary | ICD-10-CM

## 2020-08-27 DIAGNOSIS — J449 Chronic obstructive pulmonary disease, unspecified: Secondary | ICD-10-CM | POA: Diagnosis not present

## 2020-08-27 DIAGNOSIS — J479 Bronchiectasis, uncomplicated: Secondary | ICD-10-CM | POA: Diagnosis not present

## 2020-08-27 MED ORDER — AZELASTINE HCL 0.1 % NA SOLN: 1.0000 | Freq: Two times a day (BID) | NASAL | 12 refills | Status: DC

## 2020-08-27 MED ORDER — PREDNISONE 10 MG PO TABS: ORAL_TABLET | ORAL | 0 refills | Status: DC

## 2020-08-27 MED ORDER — FLUTICASONE PROPIONATE 50 MCG/ACT NA SUSP: 1.0000 | Freq: Every day | NASAL | 2 refills | Status: AC

## 2020-08-27 NOTE — Patient Instructions (Signed)
It is good to see you again  Trial of Flonase 1 spray each nare daily and azelastine 1 spray each nare twice a day to help with the nasal congestion.  Use prednisone as prescribed, a taper over 2 weeks to help your breathing, calm down the inflammation.  Try to increase her hypertonic saline to 3 times a day to try to clear out all this mucus.  I know this is difficult and time-consuming.  Try to do this over the next week or 2 and then you can back off to twice a day.  Seriously consider enrolling in pulmonary rehab.  I will resend the referral today.  I think this is the best tool we have to help improve your symptoms.  Return to clinic in 3 months with Dr. Silas Flood for follow-up

## 2020-08-27 NOTE — Progress Notes (Signed)
@Patient  ID: Jeffrey Hines, male    DOB: Aug 07, 1945, 75 y.o.   MRN: [id number]  Chief Complaint  Patient presents with  . Follow-up    F/U per patient. States he has been more congested lately. Productive cough with green phlegm.     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    08/27/2020  - Visit  Exacerbation last month.  Fever.  Ongoing increased bleeding production.  Was prescribed moxifloxacin.  Help with the fever, sputum production but very shortly.  Now with increased pain in production.  A lot of nasal congestion, sinus congestion.  Using antihistamine but not much else.  Long conversation today regarding everything going on, my desire to try to help him feel better.  Counseled that pulmonary rehab is likely the best tool I have.  Discussed hospice services.  Please see below.  Questionaires / Pulmonary Flowsheets:   ACT:  No flowsheet data found.  MMRC: No flowsheet data found.  Epworth:  No flowsheet data found.  Tests:   FENO:  No results found for: NITRICOXIDE  PFT: PFT Results Latest Ref Rng & Units 11/07/2014 08/07/2014  FVC-Pre L 4.89 2.93  FVC-Predicted Pre % 104 62  FVC-Post L 5.38 3.44  FVC-Predicted Post % 115 73  Pre FEV1/FVC % % 46 49  Post FEV1/FCV % % 46 50  FEV1-Pre L 2.24 1.44  FEV1-Predicted Pre % 64 41  FEV1-Post L 2.47 1.71  DLCO uncorrected ml/min/mmHg 10.62 11.13  DLCO UNC% % 31 33  DLCO corrected ml/min/mmHg 11.70 11.29  DLCO COR %Predicted % 34 33  DLVA Predicted % 40 55  TLC L 7.79 6.73  TLC % Predicted % 107 93  RV % Predicted % 101 131    WALK:  SIX MIN WALK 04/28/2015 10/28/2014  Supplimental Oxygen during Test? (L/min) No No  Tech Comments: - pt walked a slow pace,  tolerated walk well.     Imaging: Reviewed and as per EMR and discussion in this note  Lab Results: Extensively reviewed including multiple sputum and AFB cultures CBC    Component Value Date/Time   WBC 13.3 (H) 04/24/2020 0338   RBC 4.35 04/24/2020 0338   RBC 4.30 04/24/2020 0338   HGB 11.5 (L) 04/24/2020 0338   HGB 12.5 (L) 04/17/2020 1301   HGB 12.9 (L) 02/08/2017 1343   HCT 36.9 (L) 04/24/2020 0338   HCT 38.9 02/08/2017 1343   PLT 209 04/24/2020 0338   PLT 269 04/17/2020 1301   PLT 218 02/08/2017 1343   MCV 84.8 04/24/2020 0338   MCV 85.7 02/08/2017 1343   MCH 26.4 04/24/2020 0338   MCHC 31.2 04/24/2020 0338   RDW 14.6 04/24/2020 0338   RDW 12.8 02/08/2017 1343   LYMPHSABS 1.0 04/24/2020 0338   LYMPHSABS 0.9 02/08/2017 1343   MONOABS 1.3 (H) 04/24/2020 0338   MONOABS 0.5 02/08/2017 1343   EOSABS 0.0 04/24/2020 0338   EOSABS 0.1 02/08/2017 1343   BASOSABS 0.0 04/24/2020 0338   BASOSABS 0.0 02/08/2017 1343    BMET    Component Value Date/Time   NA 140 04/24/2020 0338   NA 142 02/08/2017 1344   K 4.3 04/24/2020 0338   K 3.5 02/08/2017 1344   CL 103 04/24/2020 0338   CO2 28 04/24/2020 0338  CO2 28 02/08/2017 1344   GLUCOSE 105 (H) 04/24/2020 0338   GLUCOSE 113 02/08/2017 1344   BUN 17 04/24/2020 0338   BUN 13.5 02/08/2017 1344   CREATININE 0.85 04/24/2020 0338   CREATININE 1.02 04/17/2020 1301   CREATININE 0.9 02/08/2017 1344   CALCIUM 8.5 (L) 04/24/2020 0338   CALCIUM 9.0 02/08/2017 1344   GFRNONAA >60 04/24/2020 0338   GFRNONAA >60 04/17/2020 1301   GFRAA >60 01/17/2020 1355    BNP    Component Value Date/Time   BNP 30.5 08/03/2014 1359    ProBNP    Component Value Date/Time   PROBNP 101.0 (H) 01/07/2020 1641    Specialty Problems      Pulmonary Problems   COPD with chronic bronchitis ([place])    March 2016 pulmonary function test> ratio 50%, FEV1 L (49% predicted, 18% change with bronchodilator), total lung capacity 6.73 L (93%  predicted), DLCO 11.13 (33% predicted). June 2016 pulmonary function testing ratio 46%, FEV1 2.47 L (71% Pred), total lung capacity 7.79 L (107% predicted), DLCO 10.62 (31% predicted). 11/2014 ONO < 88% 2 hours      Lung mass   Post-obstruction pneumonia due to Pseudomonas aeruginosa    Endobronchial mass causing LUL obstruction      Squamous cell carcinoma of lung, stage II ([place])    - Status post left upper lobectomy summer 2016 - seen at [facility] and underwent a bronchoscopy on March 16 2017 with a balloon dilation of the focal area of acquired bronchomalacia and stenosis of the left lower lobe orifice this was felt due to torsion of the lung post lobectomy       Chronic respiratory failure with hypoxia (HCC)    2L O2 qHS 05/2015 ONO RA> less than 88% 1 hour, 31 minutes, lowest 77%      Lung cancer (HCC)   Cough   Community acquired pneumonia of left lung   Hypoxia   Pneumonia due to Pseudomonas species ([place])   Postobstructive pneumonia   Bronchiectasis with (acute) exacerbation ([place])   Bronchiectasis without complication ([place])   CAP (community acquired pneumonia)   Pneumonia      Allergies  Allergen Reactions  . Anoro Ellipta [Umeclidinium-Vilanterol] Hives  . Prolixin [Fluphenazine]     Severe back pain  . Advair Diskus [Fluticasone-Salmeterol] Anxiety    Immunization History  Administered Date(s) Administered  . Fluad Quad(high Dose 65+) 03/20/2020  . Influenza Inj Mdck Quad Pf 03/01/2018  . Influenza Split 04/14/2015  . Influenza Whole 02/27/2014  . Influenza, High Dose Seasonal PF 06/02/2016, 04/06/2017, 02/19/2019  . Influenza-Unspecified 12/27/2019  . Moderna Sars-Covid-2 Vaccination 07/09/2019, 08/06/2019  . Pneumococcal Conjugate-13 04/14/2015  . Pneumococcal Polysaccharide-23 05/03/2016    Past Medical History:  Diagnosis Date  . Anxiety   . Cancer ([place])   . Chronic lower back pain   . Closed head injury 1998  . Constipation due to  opioid therapy   . COPD (chronic obstructive pulmonary disease) ([place])   . COPD with chronic bronchitis ([place])   . Full dentures   . GERD (gastroesophageal reflux disease)   . Hemoptysis 08/03/2014  . Hypertension   . Multiple rib fractures 1998   left side  . On supplemental oxygen therapy    2L of O2 at night  . Post-obstruction pneumonia due to Pseudomonas aeruginosa 08/07/2014   Endobronchial mass causing LUL obstruction  . Radiation 08/25/14-09/29/14   left upper central lung 45 Gy  . Seizures ([place])   . Shortness of  breath dyspnea   . Shoulder dislocation 1998   left  . Spontaneous pneumothorax 08/03/2014   Left side 1st time episode spontaneous pneumothorax associated with acute flare of COPD   . Tobacco abuse     Tobacco History: Social History   Tobacco Use  Smoking Status Former Smoker  . Packs/day: 0.50  . Years: 50.00  . Pack years: 25.00  . Types: Cigarettes  . Quit date: 08/03/2014  . Years since quitting: 6.0  Smokeless Tobacco Never Used   Counseling given: Not Answered   Continue to not smoke  Outpatient Encounter Medications as of 08/27/2020  Medication Sig  . albuterol (PROVENTIL) (2.5 MG/3ML) 0.083% nebulizer solution Take 3 mLs (2.5 mg total) by nebulization every 2 (two) hours as needed for wheezing or shortness of breath.  Marland Kitchen azelastine (ASTELIN) 0.1 % nasal spray Place 1 spray into both nostrils 2 (two) times daily. Use in each nostril as directed  . Bioflavonoid Products (BIOFLEX PO) Take 1 tablet by mouth 2 (two) times daily.   . [place]-Glucosamine-Vit D (OSTEO BI-FLEX ONE PER DAY) TABS Take 1 tablet by mouth in the morning and at bedtime.  . clonazePAM (KLONOPIN) 0.5 MG tablet Take 0.5 mg by mouth 2 (two) times daily.   . fluticasone (FLONASE) 50 MCG/ACT nasal spray Place 1 spray into both nostrils daily.  . furosemide (LASIX) 20 MG tablet Take 1 tablet (20 mg total) by mouth daily.  Marland Kitchen guaiFENesin (MUCINEX) 600 MG 12 hr tablet Take 1 tablet (600 mg  total) by mouth 2 (two) times daily.  Marland Kitchen loratadine (CLARITIN) 10 MG tablet Take 10 mg by mouth in the morning and at bedtime.   . polyethylene glycol (MIRALAX / GLYCOLAX) 17 g packet Take 17 g by mouth daily.  . predniSONE (DELTASONE) 10 MG tablet Take 4 tablets (40 mg total) by mouth daily with breakfast for 5 days, THEN 2 tablets (20 mg total) daily with breakfast for 5 days, THEN 1 tablet (10 mg total) daily with breakfast for 5 days.  . Probiotic Product (PROBIOTIC-10 PO) Take by mouth at bedtime.  Marland Kitchen Respiratory Therapy Supplies (FLUTTER) DEVI 1 Device by Does not apply route as directed.  Marland Kitchen rOPINIRole (REQUIP) 1 MG tablet Take 1 mg by mouth at bedtime.  . Saw Palmetto 450 MG CAPS Take 3 capsules by mouth in the morning and at bedtime. Take 2 capsules in the morning and Take 1 capsule at bedtime  . sodium chloride HYPERTONIC 3 % nebulizer solution Take 4 mLs by nebulization 2 (two) times daily as needed for cough.   . SUMAtriptan (IMITREX) 25 MG tablet Take 1 tablet (25 mg total) by mouth 2 (two) times daily as needed for migraine or headache. May repeat in 2 hours if headache persists or recurs.  . tamsulosin (FLOMAX) 0.4 MG CAPS capsule TAKE 1 CAPSULE BY MOUTH EVERY DAY (Patient taking differently: Take 0.4 mg by mouth daily.)  . Tiotropium Bromide-Olodaterol (STIOLTO RESPIMAT) 2.5-2.5 MCG/ACT AERS Inhale 2 puffs into the lungs daily.   . Turmeric 500 MG CAPS Take 1 capsule by mouth in the morning and at bedtime.   . [DISCONTINUED] doxycycline (VIBRA-TABS) 100 MG tablet Take 1 tablet (100 mg total) by mouth 2 (two) times daily.   No facility-administered encounter medications on file as of 08/27/2020.      Physical Exam  BP 116/72   Pulse (!) 112   Temp 98.1 F (36.7 C) (Temporal)   Ht 5\' 11"  (1.803 m)   Wt  159 lb (72.1 kg)   SpO2 96% Comment: on 3L  BMI 22.18 kg/m   Wt Readings from Last 5 Encounters:  08/27/20 159 lb (72.1 kg)  05/25/20 161 lb 12.8 oz (73.4 kg)  05/01/20 160  lb 12.8 oz (72.9 kg)  04/23/20 166 lb 3.2 oz (75.4 kg)  04/20/20 166 lb 3.2 oz (75.4 kg)    BMI Readings from Last 5 Encounters:  08/27/20 22.18 kg/m  05/25/20 22.57 kg/m  05/01/20 22.43 kg/m  04/23/20 23.18 kg/m  04/20/20 23.18 kg/m     Physical Exam Gen: chronically ill appearing, in NAD Respiratory: Inspiratory crackles low to mid lung fields, diffuse wheeze, NWOB on 3L [place] CV: tachy, regular rhythm   Assessment & Plan:   Bronchiectasis: Has grown Psa in past. Occasional odd NTM grown in culture but smear negative. Most recent culture 08/2019 negative. Most recent 2021 and species could nto be identified on DNA probe, false positive?Marland Kitchen  Recent exacerbation last month with moxifloxacin.  Ongoing sputum production.  Worried that sputum is just backed up, needs additional airway clearance --Increase HTS to 3 times daily, albuterol BID, fluter valve BID  Severe COPD: Related to cigarette use and bronchiectasis. Very wheezy on exam. --Continue stiolto for dual bronchodilation, tolerate perceived side effects of low back pain, consider nebulized LAMA/LABA in future (VA benefit) --Prednisone taper for shortness of breath  Chronic hypoxemic respiratory failure:3L. Related to above. Stable.  Nasal congestion/chronic sinusitis: Ongoing nasal congestion, rhinorrhea, postnasal drip. --To new antihistamine, add azelastine, add Flonase  GOC: Overall, gradual worsening in condition. Each exacerbation, Weight loss, evidence of pulmonary cachexia.  Again discussed today the importance of being active, trying to do pulmonary rehab in an effort to break the cycle that is vicious of lack of motivation, deconditioning, shortness of breath will only get worse the longer he remains inactive.  Counseled him and encouraged strongly that he pursue pulmonary rehab.  Once again, discussed hospice services.  Frankly I think this would be warranted at this time.  He has not consider this.  Asked that they  pursue pulmonary rehab and we can reconsider based on his response and how he is feeling.  Ultimately, think hospice services would be warranted and to be quite beneficial.  He was counseled the same. -Referral to pulmonary rehab  Return in about 3 months (around 11/27/2020).   Lanier Clam, MD 08/27/2020   This appointment required 48 minutes of patient care (this includes precharting, chart review, review of results, face-to-face care, etc.).

## 2020-09-03 ENCOUNTER — Encounter (HOSPITAL_COMMUNITY): Payer: Self-pay | Admitting: *Deleted

## 2020-09-03 NOTE — Progress Notes (Signed)
Received referral from Dr. Silas Flood for this pt to participate in pulmonary rehab with the the diagnosis of chronic respiratory failure with hypoxia and bronchiectasis. Clinical review of pt follow up appt on 3/24 Pulmonary office note.  Pt with Covid Risk Score - 5. Pt appropriate for scheduling for Pulmonary rehab.  Will forward to support staff for scheduling and verification of insurance eligibility/benefits with pt consent. Cherre Huger, BSN Cardiac and Training and development officer

## 2020-09-07 ENCOUNTER — Telehealth: Payer: Self-pay | Admitting: Pulmonary Disease

## 2020-09-07 NOTE — Telephone Encounter (Signed)
Called and spoke with patient wife Clarene Critchley ([place]). Dr. Kavin Leech recommendations given. Understanding stated. Clarene Critchley stated either option at this time would be fine. Clarene Critchley stated which ever option Dr. Silas Flood think's is best option is the option they would like to go with at this time.  Message routed to Dr.Hunsucker to advise

## 2020-09-07 NOTE — Telephone Encounter (Signed)
Called and spoke with wife Clarene Critchley about pulmonary rehab options. She states they live in [place] would be an option and she is going to talk to husband about it and see what he says. She also states that patient normally always responds really well when taking prednisone but is wondering if there is something for him to take to make him feel less achy and has noticed that since he's down to 10 mg a day he's having more difficulty breathing. States before he could walk out to mailbox or his building and not stop and breathing was good and the other day she said he was in a hurry to sit down when walking out to building.   Dr. Silas Flood please advise

## 2020-09-07 NOTE — Telephone Encounter (Signed)
We have limited options. Long term prednisone use would be one option. The other option is using azithromycin every Monday, Wednesday, Friday for anti-inflammatory properties. Ideally, we would avoid macrolides like azithromycin with bronchiectasis. In his case, either would be a reasonable option as the main goal is to help him feel better now, less concerned with longer term effects.

## 2020-09-11 MED ORDER — PREDNISONE 20 MG PO TABS: 20.0000 mg | ORAL_TABLET | Freq: Every day | ORAL | 1 refills | Status: DC

## 2020-09-11 NOTE — Telephone Encounter (Signed)
Called and spoke with Clarene Critchley letting her know the recs stated by Dr. Vaughan Browner and also stated to her that we have sent this to Dr. Silas Flood for further recommendations. Clarene Critchley verbalized understanding. Verified pt's preferred pharmacy and sent prednisone 20mg  in for pt. Nothing further needed.

## 2020-09-11 NOTE — Telephone Encounter (Signed)
Called and spoke with pt's wife Jeffrey Hines who stated pt has a lot of aches and pains from arthritis but states when he has the pains, his breathing is also affected and then the pain becomes worse.  She said when the prednisone was wearing off is when this started to begin. Due to this, Jeffrey Hines is wanting to know what could be recommended to help with pt's symptoms whether it be for pt to be on long-term prednisone or for him to be on Azithromycin Mon, Wed, Fri.  Due to [facility] showing that Dr. Silas Flood is off, sending this to Dr. Vaughan Browner who is provider of the day. Also going to send to Dr. Silas Flood as an Juluis Rainier.  Dr. Vaughan Browner, please advise.

## 2020-09-11 NOTE — Telephone Encounter (Signed)
Start prednisone 20 mg a day.  He can follow-up with Dr. Silas Flood for reevaluation and titration

## 2020-09-11 NOTE — Telephone Encounter (Signed)
Jeffrey Hines states patient pain has increased. Jeffrey Hines phone number is [phone].

## 2020-10-01 ENCOUNTER — Telehealth: Payer: Self-pay | Admitting: Pulmonary Disease

## 2020-10-01 MED ORDER — AMOXICILLIN-POT CLAVULANATE 875-125 MG PO TABS: ORAL_TABLET | ORAL | 5 refills | Status: DC

## 2020-10-01 NOTE — Telephone Encounter (Signed)
Received a fax request from [facility] in [facility] for a refill of Augmentin #14, take 1 tab PO BID Xfirst 7 days of every other month.  Per last OV note I did not see any mention of ongoing abx therapy, and this is not on his current med list. I do see where this has been previously filled in the past, most recently by Dr. Valeta Harms in 08/2019.   Dr. Silas Flood please advise if pt is supposed to be on maintenance abx therapy, and if you want this refill to be sent in. Thanks!

## 2020-10-01 NOTE — Telephone Encounter (Signed)
Fine to refill 

## 2020-10-01 NOTE — Telephone Encounter (Signed)
rx sent in as requested to preferred pharmacy. Nothing further needed at this time- will close encounter.

## 2020-10-08 ENCOUNTER — Other Ambulatory Visit: Payer: Self-pay | Admitting: Pulmonary Disease

## 2020-10-08 ENCOUNTER — Telehealth (HOSPITAL_COMMUNITY): Payer: Self-pay

## 2020-10-08 ENCOUNTER — Encounter (HOSPITAL_COMMUNITY): Payer: Self-pay

## 2020-10-08 NOTE — Telephone Encounter (Signed)
Attempted to call patient in regards to Cardiac Rehab - LM on VM Mailed letter 

## 2020-10-08 NOTE — Telephone Encounter (Signed)
Pt insurance is active and benefits verified through Medicare A/B. Co-pay $0.00, DED $233.00/$233.00 met, out of pocket $0.00/$0.00 met, co-insurance 20%. No pre-authorization required. 10/08/20 @ 12:13PM  Will contact patient to see if she is interested in the Pulmonary Rehab Program.

## 2020-10-09 NOTE — Telephone Encounter (Signed)
Received a refill request for Flomax 0.4mg . I looked at his chart and it looks you refilled this for him 6 months ago. I wasn't able to find a phone note or OV note attached to the refill.   Are you ok with this refill? Or does this need to be addressed by his PCP?

## 2020-10-12 ENCOUNTER — Telehealth: Payer: Self-pay | Admitting: Pulmonary Disease

## 2020-10-12 NOTE — Telephone Encounter (Signed)
Dr .Silas Flood please advise on the pt's request for Flomax refill.

## 2020-10-13 ENCOUNTER — Telehealth (HOSPITAL_COMMUNITY): Payer: Self-pay | Admitting: *Deleted

## 2020-10-13 NOTE — Telephone Encounter (Signed)
Pt wife who is listed on his DPR returned call after receiving please contact letter in the mail. Reviewed insurance benefits and where he is with the wait list for scheduling pulmonary rehab. General exercise guidelines given to pt wife for him to do in the interim.  Reviewed expectations for independent group exercise and orientation/walktest. Pt wife states that her husband - Jeffrey Hines tends to go faster than he should then tires out quickly becomes short of breath which then frustrates him and he then becomes even more short of breath.  Advised pt wife on techniques for pacing activity. Pt wife verbalized understanding.  Updated preferred phone number for contact which is [phone].  Cherre Huger, BSN Cardiac and Training and development officer

## 2020-10-14 NOTE — Telephone Encounter (Signed)
Spoke with Clarene Critchley  She states pt out of flomax and needs refill asap  I advised that we typically do not prescribe this medication  She states she realizes that, however, Dr Silas Flood refilled it last time  She wants to have [name] send at least once more since he does not see PCP for another wk  She is unsure who initially started him on it, but thinks it was the PCP  Please advise thanks

## 2020-10-14 NOTE — Telephone Encounter (Signed)
Pt's wife Clarene Critchley calling about the status of the refill on Flomax. Clarene Critchley can be reached at [phone].

## 2020-10-16 ENCOUNTER — Ambulatory Visit (HOSPITAL_COMMUNITY)
Admission: RE | Admit: 2020-10-16 | Discharge: 2020-10-16 | Disposition: A | Discharge status: Home / Self Care | Payer: Medicare Other | Source: Ambulatory Visit | Attending: Internal Medicine | Admitting: Internal Medicine

## 2020-10-16 ENCOUNTER — Other Ambulatory Visit: Payer: Self-pay

## 2020-10-16 ENCOUNTER — Inpatient Hospital Stay: Payer: Medicare Other | Attending: Internal Medicine

## 2020-10-16 DIAGNOSIS — C3431 Malignant neoplasm of lower lobe, right bronchus or lung: Secondary | ICD-10-CM | POA: Diagnosis not present

## 2020-10-16 DIAGNOSIS — J449 Chronic obstructive pulmonary disease, unspecified: Secondary | ICD-10-CM | POA: Insufficient documentation

## 2020-10-16 DIAGNOSIS — C3490 Malignant neoplasm of unspecified part of unspecified bronchus or lung: Secondary | ICD-10-CM | POA: Insufficient documentation

## 2020-10-16 DIAGNOSIS — Z902 Acquired absence of lung [part of]: Secondary | ICD-10-CM | POA: Diagnosis not present

## 2020-10-16 DIAGNOSIS — Z9221 Personal history of antineoplastic chemotherapy: Secondary | ICD-10-CM | POA: Insufficient documentation

## 2020-10-16 LAB — CBC WITH DIFFERENTIAL (CANCER CENTER ONLY)
Abs Immature Granulocytes: 0.07 10*3/uL (ref 0.00–0.07)
Basophils Absolute: 0 10*3/uL (ref 0.0–0.1)
Basophils Relative: 0 %
Eosinophils Absolute: 0 10*3/uL (ref 0.0–0.5)
Eosinophils Relative: 0 %
HCT: 45 % (ref 39.0–52.0)
Hemoglobin: 14 g/dL (ref 13.0–17.0)
Immature Granulocytes: 1 %
Lymphocytes Relative: 2 %
Lymphs Abs: 0.3 10*3/uL — ABNORMAL LOW (ref 0.7–4.0)
MCH: 27.7 pg (ref 26.0–34.0)
MCHC: 31.1 g/dL (ref 30.0–36.0)
MCV: 89.1 fL (ref 80.0–100.0)
Monocytes Absolute: 0.7 10*3/uL (ref 0.1–1.0)
Monocytes Relative: 5 %
Neutro Abs: 13.2 10*3/uL — ABNORMAL HIGH (ref 1.7–7.7)
Neutrophils Relative %: 92 %
Platelet Count: 287 10*3/uL (ref 150–400)
RBC: 5.05 MIL/uL (ref 4.22–5.81)
RDW: 13.6 % (ref 11.5–15.5)
WBC Count: 14.3 10*3/uL — ABNORMAL HIGH (ref 4.0–10.5)
nRBC: 0 % (ref 0.0–0.2)

## 2020-10-16 LAB — CMP (CANCER CENTER ONLY)
ALT: 9 U/L (ref 0–44)
AST: 13 U/L — ABNORMAL LOW (ref 15–41)
Albumin: 3.1 g/dL — ABNORMAL LOW (ref 3.5–5.0)
Alkaline Phosphatase: 64 U/L (ref 38–126)
Anion gap: 9 (ref 5–15)
BUN: 22 mg/dL (ref 8–23)
CO2: 31 mmol/L (ref 22–32)
Calcium: 9.1 mg/dL (ref 8.9–10.3)
Chloride: 96 mmol/L — ABNORMAL LOW (ref 98–111)
Creatinine: 1.17 mg/dL (ref 0.61–1.24)
GFR, Estimated: >60 mL/min (ref >60)
Glucose, Bld: 251 mg/dL — ABNORMAL HIGH (ref 70–99)
Potassium: 4.8 mmol/L (ref 3.5–5.1)
Sodium: 136 mmol/L (ref 135–145)
Total Bilirubin: 0.6 mg/dL (ref 0.3–1.2)
Total Protein: 7.6 g/dL (ref 6.5–8.1)

## 2020-10-16 MED ORDER — IOHEXOL 300 MG/ML  SOLN
75.0000 mL | Freq: Once | INTRAMUSCULAR | Status: AC | PRN
  Administered 2020-10-16: 75 mL via INTRAVENOUS

## 2020-10-16 MED ORDER — SODIUM CHLORIDE (PF) 0.9 % IJ SOLN
INTRAMUSCULAR | Status: AC
  Filled 2020-10-16: qty 50

## 2020-10-16 NOTE — Telephone Encounter (Signed)
Flomax was sent in 2 days. Will close encounter.

## 2020-10-19 ENCOUNTER — Other Ambulatory Visit: Payer: Self-pay

## 2020-10-19 ENCOUNTER — Encounter: Payer: Self-pay | Admitting: Internal Medicine

## 2020-10-19 ENCOUNTER — Telehealth: Payer: Self-pay | Admitting: Pulmonary Disease

## 2020-10-19 ENCOUNTER — Inpatient Hospital Stay (HOSPITAL_BASED_OUTPATIENT_CLINIC_OR_DEPARTMENT_OTHER): Payer: Medicare Other | Admitting: Internal Medicine

## 2020-10-19 VITALS — BP 127/78 | HR 123 | Temp 97.1°F | Resp 20 | Ht 71.0 in | Wt 156.1 lb

## 2020-10-19 DIAGNOSIS — C3490 Malignant neoplasm of unspecified part of unspecified bronchus or lung: Secondary | ICD-10-CM | POA: Diagnosis not present

## 2020-10-19 DIAGNOSIS — Z9221 Personal history of antineoplastic chemotherapy: Secondary | ICD-10-CM | POA: Diagnosis not present

## 2020-10-19 DIAGNOSIS — Z902 Acquired absence of lung [part of]: Secondary | ICD-10-CM | POA: Diagnosis not present

## 2020-10-19 DIAGNOSIS — C3491 Malignant neoplasm of unspecified part of right bronchus or lung: Secondary | ICD-10-CM

## 2020-10-19 DIAGNOSIS — J449 Chronic obstructive pulmonary disease, unspecified: Secondary | ICD-10-CM | POA: Diagnosis not present

## 2020-10-19 MED ORDER — CEFDINIR 300 MG PO CAPS: 300.0000 mg | ORAL_CAPSULE | Freq: Two times a day (BID) | ORAL | 0 refills | Status: DC

## 2020-10-19 NOTE — Telephone Encounter (Signed)
Recommend sputum culture , can come by the office to pick up and return   Fluids and rest  Can begin Omnicef 300mg  Twice daily  For 7 days -take with food .   Need ov for evaluation   Please contact office for sooner follow up if symptoms do not improve or worsen or seek emergency care

## 2020-10-19 NOTE — Telephone Encounter (Signed)
Spoke with patient he states that he was taking Augmentin about a week ago. He states that he rotates every other month between Doxy and Augmentin. Patient feels like they are not working for him right now.

## 2020-10-19 NOTE — Telephone Encounter (Signed)
What Antibiotic is he taking , I do not see a message about abx or recent visit since March .  Last abx was 4/28 which is his cyclic abx each month
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
## 2020-10-19 NOTE — Telephone Encounter (Signed)
Called and spoke with patient who states that he is calling to see if he can be perscribed a stronger antibiotic. He is running a lowgrade fever and just feels kinda crummy. States that his fever was 100 a couple days ago. This morning it was 97.4. States he is taking tylenol and the last time he took it was this morning. Productive cough with green sputum. O2 levels have been good.   Tammy please advise on behalf of Dr. Silas Flood

## 2020-10-19 NOTE — Telephone Encounter (Signed)
Attempted to call pt but unable to reach. Left message for him to return call. °

## 2020-10-19 NOTE — Telephone Encounter (Signed)
Called and spoke with patient, advised him of TP recs. Patient states he will come by the office and pick up cups for sputum collections. States he will call back tomorrow to schedule and OV for evaluation. States he has to talk to his wife to see what her availability is.  Cups placed in front office.  Nothing further needed at this time.

## 2020-10-22 ENCOUNTER — Telehealth: Payer: Self-pay | Admitting: Internal Medicine

## 2020-10-22 NOTE — Telephone Encounter (Signed)
Scheduled per los. Called and left msg. Mailed printout  °

## 2020-11-04 ENCOUNTER — Ambulatory Visit (INDEPENDENT_AMBULATORY_CARE_PROVIDER_SITE_OTHER): Payer: Medicare Other | Admitting: Pulmonary Disease

## 2020-11-04 ENCOUNTER — Encounter: Payer: Self-pay | Admitting: Pulmonary Disease

## 2020-11-04 ENCOUNTER — Other Ambulatory Visit: Payer: Self-pay

## 2020-11-04 ENCOUNTER — Other Ambulatory Visit: Payer: Medicare Other

## 2020-11-04 DIAGNOSIS — J471 Bronchiectasis with (acute) exacerbation: Secondary | ICD-10-CM

## 2020-11-04 MED ORDER — PREDNISONE 10 MG PO TABS: ORAL_TABLET | ORAL | 1 refills | Status: AC

## 2020-11-04 NOTE — Patient Instructions (Addendum)
Nice to see you again  Jeffrey Hines the 20 mg of prednisone you have.  Start doxycycline on her normal cycle.  In 1 week, decrease prednisone to 15 mg for 7 days.  After that,  keep the dose at 10 mg a day until we meet again.  If that pleuritic chest pain is not improving in a week, let me know and I will send in a different antibiotic.  Please call the office or send a message with any concerns or issues that I can help with..  Return to clinic in 3 months or sooner as needed with Dr. Silas Flood

## 2020-11-05 ENCOUNTER — Other Ambulatory Visit: Payer: Self-pay | Admitting: Pulmonary Disease

## 2020-11-07 LAB — RESPIRATORY CULTURE OR RESPIRATORY AND SPUTUM CULTURE
MICRO NUMBER:: 11956248
SPECIMEN QUALITY:: ADEQUATE

## 2020-11-09 NOTE — Progress Notes (Signed)
@Patient  ID: Jeffrey Hines, male    DOB: [birth year], 75 y.o.   MRN: [id number]  Chief Complaint  Patient presents with  . Follow-up    3 mo f/u. States his breathing has stable since last visit. Completed the Omnicef from 2 weeks ago. Has been having some chest pain on the left side and left shoulder blade.      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    11/09/2020  - Visit  Doing ok. Treated with steroids, prednisone 20 mg daily. Improved some. Still with subacute L chest pain. Cough is stable. More energetic, lively on prednisone today.  Questionaires / Pulmonary Flowsheets:   ACT:  No flowsheet data found.  MMRC: No flowsheet data found.  Epworth:  No flowsheet data found.  Tests:   FENO:  No results found for: NITRICOXIDE  PFT: PFT Results Latest Ref Rng & Units 11/07/2014 08/07/2014  FVC-Pre L 4.89 2.93  FVC-Predicted Pre % 104 62  FVC-Post L 5.38 3.44  FVC-Predicted Post % 115 73  Pre FEV1/FVC % % 46 49  Post FEV1/FCV % % 46 50  FEV1-Pre L 2.24 1.44  FEV1-Predicted Pre % 64 41  FEV1-Post L 2.47 1.71  DLCO uncorrected ml/min/mmHg 10.62 11.13  DLCO UNC% % 31 33  DLCO corrected ml/min/mmHg 11.70 11.29  DLCO COR %Predicted % 34 33  DLVA Predicted % 40 55  TLC L 7.79 6.73  TLC % Predicted % 107 93  RV % Predicted % 101 131    WALK:  SIX MIN WALK 04/28/2015 10/28/2014  Supplimental Oxygen during Test? (L/min) No No  Tech Comments: - pt walked a slow pace, tolerated walk well.     Imaging: Reviewed and as per EMR and discussion in this note  Lab Results: Extensively reviewed including multiple sputum and AFB cultures CBC    Component Value Date/Time   WBC 14.3 (H) 10/16/2020 1341   WBC 13.3 (H)  04/24/2020 0338   RBC 5.05 10/16/2020 1341   HGB 14.0 10/16/2020 1341   HGB 12.9 (L) 02/08/2017 1343   HCT 45.0 10/16/2020 1341   HCT 38.9 02/08/2017 1343   PLT 287 10/16/2020 1341   PLT 218 02/08/2017 1343   MCV 89.1 10/16/2020 1341   MCV 85.7 02/08/2017 1343   MCH 27.7 10/16/2020 1341   MCHC 31.1 10/16/2020 1341   RDW 13.6 10/16/2020 1341   RDW 12.8 02/08/2017 1343   LYMPHSABS 0.3 (L) 10/16/2020 1341   LYMPHSABS 0.9 02/08/2017 1343   MONOABS 0.7 10/16/2020 1341   MONOABS 0.5 02/08/2017 1343   EOSABS 0.0 10/16/2020 1341   EOSABS 0.1 02/08/2017 1343   BASOSABS 0.0 10/16/2020 1341   BASOSABS 0.0 02/08/2017 1343    BMET    Component Value Date/Time   NA 136 10/16/2020 1341   NA 142 02/08/2017 1344   K 4.8 10/16/2020 1341   K 3.5 02/08/2017 1344   CL 96 (L) 10/16/2020 1341   CO2 31 10/16/2020 1341   CO2 28 02/08/2017 1344   GLUCOSE 251 (H) 10/16/2020 1341   GLUCOSE 113 02/08/2017 1344   BUN 22 10/16/2020 1341   BUN 13.5 02/08/2017 1344   CREATININE 1.17 10/16/2020 1341   CREATININE 0.9 02/08/2017 1344   CALCIUM 9.1 10/16/2020 1341   CALCIUM  9.0 02/08/2017 1344   GFRNONAA >60 10/16/2020 1341   GFRAA >60 01/17/2020 1355    BNP    Component Value Date/Time   BNP 30.5 08/03/2014 1359    ProBNP    Component Value Date/Time   PROBNP 101.0 (H) 01/07/2020 1641    Specialty Problems      Pulmonary Problems   COPD with chronic bronchitis ([place])    March 2016 pulmonary function test> ratio 50%, FEV1 L (49% predicted, 18% change with bronchodilator), total lung capacity 6.73 L (93% predicted), DLCO 11.13 (33% predicted). June 2016 pulmonary function testing ratio 46%, FEV1 2.47 L (71% Pred), total lung capacity 7.79 L (107% predicted), DLCO 10.62 (31% predicted). 11/2014 ONO < 88% 2 hours      Lung mass   Post-obstruction pneumonia due to Pseudomonas aeruginosa    Endobronchial mass causing LUL obstruction      Squamous cell carcinoma of lung, stage II ([place])     - Status post left upper lobectomy summer 2016 - seen at [facility] and underwent a bronchoscopy on March 16 2017 with a balloon dilation of the focal area of acquired bronchomalacia and stenosis of the left lower lobe orifice this was felt due to torsion of the lung post lobectomy       Chronic respiratory failure with hypoxia (HCC)    2L O2 qHS 05/2015 ONO RA> less than 88% 1 hour, 31 minutes, lowest 77%      Lung cancer (HCC)   Cough   Community acquired pneumonia of left lung   Hypoxia   Pneumonia due to Pseudomonas species ([place])   Postobstructive pneumonia   Bronchiectasis with (acute) exacerbation ([place])   Bronchiectasis without complication ([place])   CAP (community acquired pneumonia)   Pneumonia      Allergies  Allergen Reactions  . Anoro Ellipta [Umeclidinium-Vilanterol] Hives  . Prolixin [Fluphenazine]     Severe back pain  . Spiriva Respimat [Tiotropium Bromide Monohydrate]   . Advair Diskus [Fluticasone-Salmeterol] Anxiety    Immunization History  Administered Date(s) Administered  . Fluad Quad(high Dose 65+) 03/20/2020  . Influenza Inj Mdck Quad Pf 03/01/2018  . Influenza Split 04/14/2015  . Influenza Whole 02/27/2014  . Influenza, High Dose Seasonal PF 06/02/2016, 04/06/2017, 02/19/2019  . Influenza-Unspecified 12/27/2019  . Moderna Sars-Covid-2 Vaccination 07/09/2019, 08/06/2019  . Pneumococcal Conjugate-13 04/14/2015  . Pneumococcal Polysaccharide-23 05/03/2016    Past Medical History:  Diagnosis Date  . Anxiety   . Cancer ([place])   . Chronic lower back pain   . Closed head injury 1998  . Constipation due to opioid therapy   . COPD (chronic obstructive pulmonary disease) ([place])   . COPD with chronic bronchitis ([place])   . Full dentures   . GERD (gastroesophageal reflux disease)   . Hemoptysis 08/03/2014  . Hypertension   . Multiple rib fractures 1998   left side  . On supplemental oxygen therapy    2L of O2 at night  . Post-obstruction  pneumonia due to Pseudomonas aeruginosa 08/07/2014   Endobronchial mass causing LUL obstruction  . Radiation 08/25/14-09/29/14   left upper central lung 45 Gy  . Seizures ([place])   . Shortness of breath dyspnea   . Shoulder dislocation 1998   left  . Spontaneous pneumothorax 08/03/2014   Left side 1st time episode spontaneous pneumothorax associated with acute flare of COPD   . Tobacco abuse     Tobacco History: Social History   Tobacco Use  Smoking Status Former Smoker  .  Packs/day: 0.50  . Years: 50.00  . Pack years: 25.00  . Types: Cigarettes  . Quit date: 08/03/2014  . Years since quitting: 6.2  Smokeless Tobacco Never Used   Counseling given: Not Answered   Continue to not smoke  Outpatient Encounter Medications as of 11/04/2020  Medication Sig  . acetaminophen (TYLENOL) 500 MG tablet 2 tabs  . albuterol (PROVENTIL) (2.5 MG/3ML) 0.083% nebulizer solution Take 3 mLs (2.5 mg total) by nebulization every 2 (two) hours as needed for wheezing or shortness of breath.  Marland Kitchen azelastine (ASTELIN) 0.1 % nasal spray Place 1 spray into the nose 2 (two) times daily.  . BL GLUCOSAMINE-CHONDROITIN PO Take by mouth.  . clonazePAM (KLONOPIN) 0.5 MG tablet Take 0.5 mg by mouth 2 (two) times daily.   Marland Kitchen doxycycline (MONODOX) 100 MG capsule Take 1 capsule by mouth 2 (two) times daily.  . fluticasone (FLONASE) 50 MCG/ACT nasal spray Place 1 spray into both nostrils daily.  . furosemide (LASIX) 20 MG tablet Take 1 tablet (20 mg total) by mouth daily.  Marland Kitchen guaiFENesin (MUCINEX) 600 MG 12 hr tablet Take 1 tablet (600 mg total) by mouth 2 (two) times daily.  Marland Kitchen loratadine (CLARITIN) 10 MG tablet Take 10 mg by mouth in the morning and at bedtime.   . OXYGEN See admin instructions.  . predniSONE (DELTASONE) 10 MG tablet Take 1.5 tablets (15 mg total) by mouth daily with breakfast for 7 days, THEN 1 tablet (10 mg total) daily with breakfast.  . Probiotic Product (PROBIOTIC-10 PO) Take by mouth at bedtime.  Marland Kitchen  Respiratory Therapy Supplies (FLUTTER) DEVI 1 Device by Does not apply route as directed.  Marland Kitchen rOPINIRole (REQUIP) 1 MG tablet Take 1 mg by mouth at bedtime.  . sodium chloride 0.9 % nebulizer solution Inhale into the lungs.  . sodium chloride HYPERTONIC 3 % nebulizer solution Take 4 mLs by nebulization 2 (two) times daily as needed for cough.   . tamsulosin (FLOMAX) 0.4 MG CAPS capsule 1 capsule  . Tiotropium Bromide-Olodaterol (STIOLTO RESPIMAT) 2.5-2.5 MCG/ACT AERS Inhale 2 puffs into the lungs daily.   . Turmeric 500 MG CAPS Take 1 capsule by mouth in the morning and at bedtime.   . [DISCONTINUED] predniSONE (DELTASONE) 20 MG tablet Take 1 tablet (20 mg total) by mouth daily with breakfast.  . [DISCONTINUED] cefdinir (OMNICEF) 300 MG capsule Take 1 capsule (300 mg total) by mouth 2 (two) times daily.   No facility-administered encounter medications on file as of 11/04/2020.      Physical Exam  BP 122/84   Pulse (!) 117   Temp (!) 96.4 F (35.8 C) (Temporal)   Ht 5\' 11"  (1.803 m)   Wt 153 lb 6.4 oz (69.6 kg)   SpO2 96% Comment: on 4L  BMI 21.39 kg/m   Wt Readings from Last 5 Encounters:  11/04/20 153 lb 6.4 oz (69.6 kg)  10/19/20 156 lb 1.6 oz (70.8 kg)  08/27/20 159 lb (72.1 kg)  05/25/20 161 lb 12.8 oz (73.4 kg)  05/01/20 160 lb 12.8 oz (72.9 kg)    BMI Readings from Last 5 Encounters:  11/04/20 21.39 kg/m  10/19/20 21.77 kg/m  08/27/20 22.18 kg/m  05/25/20 22.57 kg/m  05/01/20 22.43 kg/m     Physical Exam Gen: chronically ill appearing, in NAD Respiratory: Inspiratory crackles low to mid lung fields, diffuse wheeze, NWOB on 3L [place] CV: tachy, regular rhythm   Assessment & Plan:   Bronchiectasis: Has grown Psa in past. Occasional odd  NTM grown in culture but smear negative. Most recent culture 08/2019 negative. Most recent 2021 and species could nto be identified on DNA probe, false positive?Marland Kitchen  Recent exacerbation last month with moxifloxacin.  Ongoing sputum  production.  Worried that sputum is just backed up, needs additional airway clearance --Continue HTS 3 times daily, albuterol BID, fluter valve BID  Severe COPD: Related to cigarette use and bronchiectasis. Less wheezy today. --Continue stiolto for dual bronchodilation, tolerate perceived side effects of low back pain, consider nebulized LAMA/LABA in future (VA benefit) --Prednisone taper 15 x 1 week then to 10 mg  --Encouraged pulmonary rehab  Chronic hypoxemic respiratory failure:3L. Related to above. Stable.  Nasal congestion/chronic sinusitis: Ongoing nasal congestion, rhinorrhea, postnasal drip. --Continue antihistamine, azelastine, Flonase    Return in about 3 months (around 02/04/2021).   Lanier Clam, MD 11/09/2020   This appointment required 42 minutes of patient care (this includes precharting, chart review, review of results, face-to-face care, etc.).

## 2020-11-19 ENCOUNTER — Telehealth: Payer: Self-pay | Admitting: Pulmonary Disease

## 2020-11-19 MED ORDER — AMOXICILLIN-POT CLAVULANATE 875-125 MG PO TABS: 1.0000 | ORAL_TABLET | Freq: Two times a day (BID) | ORAL | 0 refills | Status: DC

## 2020-11-19 NOTE — Telephone Encounter (Signed)
Called and spoke with patient to let him know the recs from Dr. Annamaria Boots. Patient expressed understanding and verified preferred pharmacy. Advised patient I would route this to Dr. Silas Flood as [facility] ONLY so he is aware. RX has been sent. Nothing further needed at this time.

## 2020-11-19 NOTE — Telephone Encounter (Signed)
Offer augmentin 875 mg, # 14, 1 twice daily Suggest he call next week to let Dr Silas Flood know how he is doing and make a decision then about prednisone dosing.

## 2020-11-19 NOTE — Telephone Encounter (Signed)
Called and spoke with patient who states that he is not feeling any better and is feeling a little worse. Feels like the antibiotic (doxy) didn't so anything for him. Patient mentions may needing a different antibotics, also states  the predisone after lower dosage has cause him to have trouble breathing. Productive cough with green sputum.   Dr. Annamaria Boots please advise as Dr. Silas Flood is off today

## 2020-11-25 ENCOUNTER — Telehealth: Payer: Self-pay | Admitting: Pulmonary Disease

## 2020-11-25 MED ORDER — PREDNISONE 20 MG PO TABS: 20.0000 mg | ORAL_TABLET | Freq: Every day | ORAL | 1 refills | Status: DC

## 2020-11-25 MED ORDER — LEVOFLOXACIN 750 MG PO TABS: 750.0000 mg | ORAL_TABLET | Freq: Every day | ORAL | 0 refills | Status: AC

## 2020-11-25 NOTE — Telephone Encounter (Signed)
Tried calling Clarene Critchley and there was no answer- LMTCB

## 2020-11-25 NOTE — Telephone Encounter (Signed)
Called and spoke with patient's wife Clarene Critchley. She stated that the amoxicillin that was sent on 11/19/20 has not helped the patient. He is still coughing up huge amounts of green phlegm. He has been struggling with increased SOB even when using his O2 at 3L. Increased fatigue. He has decreased his prednisone down to 15mg  and this is not working for him.    She wonders if he needs to go back to the 20mg  dosage of prednisone.   Pharmacy is [facility] [place].   MH, can you please advise? Thanks.

## 2020-11-25 NOTE — Telephone Encounter (Signed)
Called and spoke with patient's wife. She verbalized understanding of culture results as well as the new antibiotics. I asked if the patient had enough of prednisone to increase back to the 20mg  dosage. She stated that he had enough for a few days but will need a new prescription. He would prefer the 20mg  pills. I will go ahead and send this in for him.   Nothing further needed at time of call.

## 2020-11-25 NOTE — Telephone Encounter (Signed)
Ok to increase to 20 mg prednisone daily. Recent sputum culture with Pseudomonas, pan sensitive. 14 day course of levofloxacin sent.

## 2020-12-23 ENCOUNTER — Telehealth (HOSPITAL_COMMUNITY): Payer: Self-pay

## 2020-12-23 NOTE — Telephone Encounter (Signed)
Pt insurance is active and benefits verified through Medicare A/B. Co-pay $0.0, DED $233.00/$233.00 met, out of pocket $0.00/$0.00 met, co-insurance 10%. No pre-authorization required.   2ndary insurance is active and benefits verified through [employer].Marland Kitchen Co-pay $0.00, DED $0.00/$0.00 met, out of pocket $6,000.00/$120.31 met, co-insurance 0%. No pre-authorization required. Gina/[employer], 12/23/20 @ 4:18PM, DGL#[phone]

## 2021-01-11 LAB — AFB CULTURE WITH SMEAR (NOT AT ARMC)
Acid Fast Culture: NEGATIVE
Acid Fast Smear: NEGATIVE

## 2021-01-21 ENCOUNTER — Telehealth: Payer: Self-pay | Admitting: Pulmonary Disease

## 2021-01-21 MED ORDER — PREDNISONE 10 MG PO TABS: 10.0000 mg | ORAL_TABLET | Freq: Every day | ORAL | 0 refills | Status: DC

## 2021-01-21 NOTE — Telephone Encounter (Signed)
   Patient is scheduled to come into the office on 8/29. Sending in #30 Prednisone.  Nothing further needed at this time.

## 2021-01-25 ENCOUNTER — Telehealth: Payer: Self-pay | Admitting: Pulmonary Disease

## 2021-01-25 NOTE — Telephone Encounter (Signed)
Called [facility] to [facility] had been refilled which it had been the 10mg  tabs and already picked up by the pt. Called the pt who stated that he had picked up the 10 mg tabs but he was currently on 20mg  daily. Per prior telephone note Dr. Silas Flood did increased to 20mg  daily. Dr. Silas Flood also wanted to see pt back in a OV in 3 months which would have been in September. When RN tried to schedule OV with pt. Pt hung up. Will attempt to pt tomorrow.

## 2021-01-26 MED ORDER — PREDNISONE 20 MG PO TABS
20.0000 mg | ORAL_TABLET | Freq: Every day | ORAL | 0 refills | Status: DC
Start: 2021-01-26 — End: 2021-02-12

## 2021-01-26 NOTE — Telephone Encounter (Signed)
Called and spoke with pt's spouse Clarene Critchley who stated that she is needing a call from Dr. Silas Flood to further discuss pt's prednisone and Clarene Critchley said that this could not wait until pt's OV. While speaking with Clarene Critchley, she had asked if Rx for prednisone 20mg  had been sent to the pharmacy and I told her that I would send it so that way pt would not keep running out of the 10mg  so soon due to phone note from 6/22 stated that pt could increase to 20mg  prednisone.  They are still wanting Dr. Silas Flood to call them directly. They are aware that Dr. Silas Flood will not be in the office until tomorrow 8/24.  Routing to Dr. Silas Flood.

## 2021-01-26 NOTE — Telephone Encounter (Signed)
Patient would like Dr. Silas Flood to call him. Patient phone number is [phone].

## 2021-01-28 ENCOUNTER — Telehealth (HOSPITAL_COMMUNITY): Payer: Self-pay | Admitting: *Deleted

## 2021-01-28 NOTE — Telephone Encounter (Signed)
Called patient to give him reminder of his PR orientation appointment. I was not able to reach him but I was able to leave a message. I left instructions about wearing mask, proper shoes, directions to the department and our contact number if he has any questions.

## 2021-01-29 NOTE — Telephone Encounter (Signed)
Dr. Silas Flood please advise if you have spoke with patient?

## 2021-02-01 ENCOUNTER — Telehealth (HOSPITAL_COMMUNITY): Payer: Self-pay

## 2021-02-01 ENCOUNTER — Ambulatory Visit (HOSPITAL_COMMUNITY): Payer: Medicare Other

## 2021-02-01 NOTE — Telephone Encounter (Signed)
Noted. Closing message.

## 2021-02-01 NOTE — Telephone Encounter (Signed)
Yes, I did. Thanks.

## 2021-02-01 NOTE — Telephone Encounter (Signed)
Pt wife called and stated that pt is not doing well and will not  be able to participate in the pulmonary rehab program. I canceled his sessions and advised the pulmonary rehab team. Closed referral

## 2021-02-09 ENCOUNTER — Ambulatory Visit (HOSPITAL_COMMUNITY): Payer: Medicare Other

## 2021-02-11 ENCOUNTER — Ambulatory Visit (HOSPITAL_COMMUNITY): Payer: Medicare Other

## 2021-02-12 ENCOUNTER — Encounter: Payer: Self-pay | Admitting: Pulmonary Disease

## 2021-02-12 ENCOUNTER — Ambulatory Visit (INDEPENDENT_AMBULATORY_CARE_PROVIDER_SITE_OTHER): Payer: Medicare Other | Admitting: Pulmonary Disease

## 2021-02-12 ENCOUNTER — Other Ambulatory Visit: Payer: Self-pay

## 2021-02-12 VITALS — BP 110/70 | HR 115 | Temp 97.8°F | Ht 71.0 in | Wt 159.0 lb

## 2021-02-12 DIAGNOSIS — R059 Cough, unspecified: Secondary | ICD-10-CM | POA: Diagnosis not present

## 2021-02-12 DIAGNOSIS — J471 Bronchiectasis with (acute) exacerbation: Secondary | ICD-10-CM | POA: Diagnosis not present

## 2021-02-12 DIAGNOSIS — R0902 Hypoxemia: Secondary | ICD-10-CM | POA: Diagnosis not present

## 2021-02-12 MED ORDER — NYSTATIN 100000 UNIT/GM EX POWD: 1.0000 "application " | Freq: Three times a day (TID) | CUTANEOUS | 0 refills | Status: DC

## 2021-02-12 MED ORDER — LEVOFLOXACIN 750 MG PO TABS
750.0000 mg | ORAL_TABLET | Freq: Every day | ORAL | 0 refills | Status: DC
Start: 2021-02-12 — End: 2021-04-13

## 2021-02-12 MED ORDER — PREDNISONE 20 MG PO TABS: 20.0000 mg | ORAL_TABLET | Freq: Every day | ORAL | 4 refills | Status: DC

## 2021-02-12 NOTE — Patient Instructions (Addendum)
Nic to see you  Use Levofloxacin 750 mg (1 tab) daily for 14 days  Continue prednisone 20 mg daily  Use the nystatin powder 3 times a day for 10 days at the rash on the bottom. If that doesn't help you can try an over the counter anti fungal cream/ointment that ends in -azole.   Try to call [facility] to see if they will pick up the vest.  Return to clinic in 3 months or sooner as needed.

## 2021-02-16 ENCOUNTER — Ambulatory Visit (HOSPITAL_COMMUNITY): Payer: Medicare Other

## 2021-02-17 ENCOUNTER — Other Ambulatory Visit: Payer: Self-pay | Admitting: Pulmonary Disease

## 2021-02-18 ENCOUNTER — Ambulatory Visit (HOSPITAL_COMMUNITY): Payer: Medicare Other

## 2021-02-23 ENCOUNTER — Ambulatory Visit (HOSPITAL_COMMUNITY): Payer: Medicare Other

## 2021-02-25 ENCOUNTER — Ambulatory Visit (HOSPITAL_COMMUNITY): Payer: Medicare Other

## 2021-03-01 NOTE — Progress Notes (Signed)
@Patient  ID: Jeffrey Hines, male    DOB: [birth year], 75 y.o.   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   In the interim since last visit he has had multiple exacerbations.  Sputum grew Pseudomonas.  Was treated with levofloxacin for a period.  We increased his prednisone to 20 mg daily from 10 mg. Overall, has declined.  Now on chronic relatively high-dose steroids.  He said that this helps with energy and some with his cough.  However after his recent levofloxacin cough is worsened.  Was given another antibiotic course in the interim.  Reports levofloxacin helped quite a bit.  He notes rash on his buttock, over both cheeks, some in the crack as well.  Denies pressure injury.  Wife does not think with psychiatric injury either.   Questionaires / Pulmonary Flowsheets:   ACT:  No flowsheet data found.  MMRC: No flowsheet data found.  Epworth:  No flowsheet data found.  Tests:   FENO:  No results found for: NITRICOXIDE  PFT: PFT Results Latest Ref Rng & Units 11/07/2014 08/07/2014  FVC-Pre L 4.89 2.93  FVC-Predicted Pre % 104 62  FVC-Post L 5.38 3.44  FVC-Predicted Post % 115 73  Pre FEV1/FVC % % 46 49  Post FEV1/FCV % % 46 50  FEV1-Pre L 2.24 1.44  FEV1-Predicted Pre % 64 41  FEV1-Post L 2.47 1.71  DLCO uncorrected ml/min/mmHg 10.62 11.13  DLCO UNC% % 31 33  DLCO corrected ml/min/mmHg 11.70 11.29  DLCO COR %Predicted % 34 33  DLVA Predicted % 40 55  TLC L 7.79 6.73  TLC % Predicted % 107 93  RV % Predicted % 101 131  Personally reviewed and interpreted as moderate to severe obstruction with severely reduced DLCO  WALK:  SIX MIN WALK  04/28/2015 10/28/2014  Supplimental Oxygen during Test? (L/min) No No  Tech Comments: - pt walked a slow pace, tolerated walk well.     Imaging: Reviewed and as per EMR and discussion in this note  Lab Results: Extensively reviewed including multiple sputum and AFB cultures CBC    Component Value Date/Time   WBC 14.3 (H) 10/16/2020 1341   WBC 13.3 (H) 04/24/2020 0338   RBC 5.05 10/16/2020 1341   HGB 14.0 10/16/2020 1341   HGB 12.9 (L) 02/08/2017 1343   HCT 45.0 10/16/2020 1341   HCT 38.9 02/08/2017 1343   PLT 287 10/16/2020 1341   PLT 218 02/08/2017 1343   MCV 89.1 10/16/2020 1341   MCV 85.7 02/08/2017 1343   MCH 27.7 10/16/2020 1341   MCHC 31.1 10/16/2020 1341   RDW 13.6 10/16/2020 1341   RDW 12.8 02/08/2017 1343   LYMPHSABS 0.3 (L) 10/16/2020 1341   LYMPHSABS 0.9 02/08/2017 1343   MONOABS 0.7 10/16/2020 1341   MONOABS 0.5 02/08/2017 1343   EOSABS 0.0 10/16/2020 1341   EOSABS 0.1 02/08/2017 1343   BASOSABS 0.0 10/16/2020 1341   BASOSABS 0.0 02/08/2017 1343    BMET    Component Value Date/Time   NA 136 10/16/2020 1341   NA 142 02/08/2017 1344   K 4.8 10/16/2020 1341   K 3.5 02/08/2017 1344   CL 96 (L) 10/16/2020 1341  CO2 31 10/16/2020 1341   CO2 28 02/08/2017 1344   GLUCOSE 251 (H) 10/16/2020 1341   GLUCOSE 113 02/08/2017 1344   BUN 22 10/16/2020 1341   BUN 13.5 02/08/2017 1344   CREATININE 1.17 10/16/2020 1341   CREATININE 0.9 02/08/2017 1344   CALCIUM 9.1 10/16/2020 1341   CALCIUM 9.0 02/08/2017 1344   GFRNONAA >60 10/16/2020 1341   GFRAA >60 01/17/2020 1355    BNP    Component Value Date/Time   BNP 30.5 08/03/2014 1359    ProBNP    Component Value Date/Time   PROBNP 101.0 (H) 01/07/2020 1641    Specialty Problems       Pulmonary Problems   COPD with chronic bronchitis ([place])    March 2016 pulmonary function test> ratio 50%, FEV1 L (49% predicted, 18% change with bronchodilator), total lung capacity 6.73 L (93% predicted), DLCO 11.13 (33%  predicted). June 2016 pulmonary function testing ratio 46%, FEV1 2.47 L (71% Pred), total lung capacity 7.79 L (107% predicted), DLCO 10.62 (31% predicted). 11/2014 ONO < 88% 2 hours      Lung mass   Post-obstruction pneumonia due to Pseudomonas aeruginosa    Endobronchial mass causing LUL obstruction      Squamous cell carcinoma of lung, stage II ([place])    - Status post left upper lobectomy summer 2016 - seen at [facility] and underwent a bronchoscopy on March 16 2017 with a balloon dilation of the focal area of acquired bronchomalacia and stenosis of the left lower lobe orifice this was felt due to torsion of the lung post lobectomy       Chronic respiratory failure with hypoxia (HCC)    2L O2 qHS 05/2015 ONO RA> less than 88% 1 hour, 31 minutes, lowest 77%      Lung cancer (HCC)   Cough   Community acquired pneumonia of left lung   Hypoxia   Pneumonia due to Pseudomonas species ([place])   Postobstructive pneumonia   Bronchiectasis with (acute) exacerbation ([place])   Bronchiectasis without complication ([place])   CAP (community acquired pneumonia)   Pneumonia    Allergies  Allergen Reactions   Anoro Ellipta [Umeclidinium-Vilanterol] Hives   Prolixin [Fluphenazine]     Severe back pain   Spiriva Respimat [Tiotropium Bromide Monohydrate]    Advair Diskus [Fluticasone-Salmeterol] Anxiety    Immunization History  Administered Date(s) Administered   Fluad Quad(high Dose 65+) 03/20/2020   Influenza Inj Mdck Quad Pf 03/01/2018   Influenza Split 04/14/2015   Influenza Whole 02/27/2014   Influenza, High Dose Seasonal PF 06/02/2016, 04/06/2017, 02/19/2019   Influenza-Unspecified 12/27/2019   Moderna Sars-Covid-2 Vaccination 07/09/2019, 08/06/2019   Pneumococcal Conjugate-13 04/14/2015   Pneumococcal Polysaccharide-23 05/03/2016    Past Medical History:  Diagnosis Date   Anxiety    Cancer ([place])    Chronic lower back pain    Closed head injury 1998   Constipation due  to opioid therapy    COPD (chronic obstructive pulmonary disease) ([place])    COPD with chronic bronchitis ([place])    Full dentures    GERD (gastroesophageal reflux disease)    Hemoptysis 08/03/2014   Hypertension    Multiple rib fractures 1998   left side   On supplemental oxygen therapy    2L of O2 at night   Post-obstruction pneumonia due to Pseudomonas aeruginosa 08/07/2014   Endobronchial mass causing LUL obstruction   Radiation 08/25/14-09/29/14   left upper central lung 45 Gy   Seizures (HCC)    Shortness of  breath dyspnea    Shoulder dislocation 1998   left   Spontaneous pneumothorax 08/03/2014   Left side 1st time episode spontaneous pneumothorax associated with acute flare of COPD    Tobacco abuse     Tobacco History: Social History   Tobacco Use  Smoking Status Former   Packs/day: 0.50   Years: 50.00   Pack years: 25.00   Types: Cigarettes   Quit date: 08/03/2014   Years since quitting: 6.5  Smokeless Tobacco Never   Counseling given: Not Answered   Continue to not smoke  Outpatient Encounter Medications as of 02/12/2021  Medication Sig   acetaminophen (TYLENOL) 500 MG tablet 2 tabs   albuterol (PROVENTIL) (2.5 MG/3ML) 0.083% nebulizer solution Take 3 mLs (2.5 mg total) by nebulization every 2 (two) hours as needed for wheezing or shortness of breath.   amoxicillin-clavulanate (AUGMENTIN) 875-125 MG tablet Take 1 tablet by mouth 2 (two) times daily.   azelastine (ASTELIN) 0.1 % nasal spray Place 1 spray into the nose 2 (two) times daily.   BL GLUCOSAMINE-CHONDROITIN PO Take by mouth.   clonazePAM (KLONOPIN) 0.5 MG tablet Take 0.5 mg by mouth 2 (two) times daily.    doxycycline (MONODOX) 100 MG capsule Take 1 capsule by mouth 2 (two) times daily.   fluticasone (FLONASE) 50 MCG/ACT nasal spray Place 1 spray into both nostrils daily.   furosemide (LASIX) 20 MG tablet Take 1 tablet (20 mg total) by mouth daily.   guaiFENesin (MUCINEX) 600 MG 12 hr tablet Take 1 tablet  (600 mg total) by mouth 2 (two) times daily.   levofloxacin (LEVAQUIN) 750 MG tablet Take 1 tablet (750 mg total) by mouth daily.   loratadine (CLARITIN) 10 MG tablet Take 10 mg by mouth in the morning and at bedtime.    nystatin (MYCOSTATIN/NYSTOP) powder Apply 1 application topically 3 (three) times daily. For 10 days   OXYGEN See admin instructions.   Probiotic Product (PROBIOTIC-10 PO) Take by mouth at bedtime.   Respiratory Therapy Supplies (FLUTTER) DEVI 1 Device by Does not apply route as directed.   rOPINIRole (REQUIP) 1 MG tablet Take 1 mg by mouth at bedtime.   sodium chloride 0.9 % nebulizer solution Inhale into the lungs.   sodium chloride HYPERTONIC 3 % nebulizer solution Take 4 mLs by nebulization 2 (two) times daily as needed for cough.    tamsulosin (FLOMAX) 0.4 MG CAPS capsule 1 capsule   Tiotropium Bromide-Olodaterol (STIOLTO RESPIMAT) 2.5-2.5 MCG/ACT AERS Inhale 2 puffs into the lungs daily.    Turmeric 500 MG CAPS Take 1 capsule by mouth in the morning and at bedtime.    [DISCONTINUED] predniSONE (DELTASONE) 10 MG tablet Take 1 tablet (10 mg total) by mouth daily with breakfast.   [DISCONTINUED] predniSONE (DELTASONE) 20 MG tablet Take 1 tablet (20 mg total) by mouth daily with breakfast.   predniSONE (DELTASONE) 20 MG tablet Take 1 tablet (20 mg total) by mouth daily with breakfast.   No facility-administered encounter medications on file as of 02/12/2021.      Physical Exam  BP 110/70 (BP Location: Left Arm, Patient Position: Sitting, Cuff Size: Normal)   Pulse (!) 115   Temp 97.8 F (36.6 C) (Oral)   Ht 5\' 11"  (1.803 m)   Wt 159 lb (72.1 kg)   SpO2 95%   BMI 22.18 kg/m   Wt Readings from Last 5 Encounters:  02/12/21 159 lb (72.1 kg)  11/04/20 153 lb 6.4 oz (69.6 kg)  10/19/20 156 lb 1.6  oz (70.8 kg)  08/27/20 159 lb (72.1 kg)  05/25/20 161 lb 12.8 oz (73.4 kg)    BMI Readings from Last 5 Encounters:  02/12/21 22.18 kg/m  11/04/20 21.39 kg/m   10/19/20 21.77 kg/m  08/27/20 22.18 kg/m  05/25/20 22.57 kg/m     Physical Exam Gen: chronically ill appearing, in NAD Respiratory: Inspiratory crackles low to mid lung fields, diffuse wheeze, NWOB on 3L [place] CV: tachy, regular rhythm Skin: Erythematous macular rash primarily right butt cheek, some involvement of the cleft, minimal involvement left butt cheek in a contiguous fashion   Assessment & Plan:   Bronchiectasis with acute exacerbation: Recent growth of Pseudomonas which had grown in the past.  Occasional odd NTM grown in culture but smear negative. Most recent culture 08/2019 negative.  Ongoing sputum production.  Worried that sputum is just backed up, needs additional airway clearance --Continue HTS 3 times daily, albuterol BID, fluter valve BID --Levofloxacin for 14 days given recent Pseudomonas growth as well as improvement after course of Pseudomonas in recent past  Severe COPD: Related to cigarette use and bronchiectasis. Less wheezy today. --Continue stiolto for dual bronchodilation, tolerate perceived side effects of low back pain, consider nebulized LAMA/LABA in future (VA benefit) --Prednisone 20 mg daily, did not tolerate recent attempt to taper, consider retrial in the future --Encouraged pulmonary rehab  Chronic hypoxemic respiratory failure:3L. Related to above. Stable.  Nasal congestion/chronic sinusitis: Ongoing nasal congestion, rhinorrhea, postnasal drip. --Continue antihistamine, azelastine, Flonase  Rash: Appears consistent with yeast infection.  Prescribed nystatin powder.  Does not appear consistent with vasculitis, cellulitis, no evidence of pressure injury.    Return in about 3 months (around 05/14/2021).   Lanier Clam, MD 03/01/2021   This appointment required 33 minutes of patient care (this includes precharting, chart review, review of results, face-to-face care, etc.).

## 2021-03-02 ENCOUNTER — Ambulatory Visit (HOSPITAL_COMMUNITY): Payer: Medicare Other

## 2021-03-04 ENCOUNTER — Ambulatory Visit (HOSPITAL_COMMUNITY): Payer: Medicare Other

## 2021-03-09 ENCOUNTER — Ambulatory Visit (HOSPITAL_COMMUNITY): Payer: Medicare Other

## 2021-03-11 ENCOUNTER — Ambulatory Visit (HOSPITAL_COMMUNITY): Payer: Medicare Other

## 2021-03-16 ENCOUNTER — Ambulatory Visit (HOSPITAL_COMMUNITY): Payer: Medicare Other

## 2021-03-18 ENCOUNTER — Ambulatory Visit (HOSPITAL_COMMUNITY): Payer: Medicare Other

## 2021-03-23 ENCOUNTER — Ambulatory Visit (HOSPITAL_COMMUNITY): Payer: Medicare Other

## 2021-03-25 ENCOUNTER — Ambulatory Visit (HOSPITAL_COMMUNITY): Payer: Medicare Other

## 2021-03-30 ENCOUNTER — Ambulatory Visit (HOSPITAL_COMMUNITY): Payer: Medicare Other

## 2021-04-01 ENCOUNTER — Telehealth: Payer: Self-pay | Admitting: Internal Medicine

## 2021-04-01 ENCOUNTER — Ambulatory Visit (HOSPITAL_COMMUNITY): Payer: Medicare Other

## 2021-04-01 NOTE — Telephone Encounter (Signed)
Rescheduled upcoming appointment due to provider on call. Patient's wife is aware.

## 2021-04-04 ENCOUNTER — Other Ambulatory Visit: Payer: Self-pay | Admitting: Pulmonary Disease

## 2021-04-05 NOTE — Telephone Encounter (Signed)
Ok to refill as before 

## 2021-04-06 ENCOUNTER — Ambulatory Visit (HOSPITAL_COMMUNITY): Payer: Medicare Other

## 2021-04-08 ENCOUNTER — Ambulatory Visit (HOSPITAL_COMMUNITY): Payer: Medicare Other

## 2021-04-13 ENCOUNTER — Telehealth: Payer: Self-pay | Admitting: Pulmonary Disease

## 2021-04-13 MED ORDER — LEVOFLOXACIN 750 MG PO TABS: 750.0000 mg | ORAL_TABLET | Freq: Every day | ORAL | 0 refills | Status: DC

## 2021-04-13 NOTE — Telephone Encounter (Signed)
Levofloxacin 750 mg tab once daily x 14 days. Please ask him to make sure he is doing airway clearance at least 3 times daily, if already doing 3 times daily ask he increase to 4 times daily while on antibiotics.

## 2021-04-13 NOTE — Telephone Encounter (Signed)
I called the wife and she reports that her spouse is having green sputum and was last seen on 02/12/21. I offered a visit and wife declined.    The wife reports that he has a CT on Monday and he see's Dr. Julien Nordmann the following week. She is requesting Levaquin to be sent into the pharmacy. Please advise.

## 2021-04-13 NOTE — Telephone Encounter (Signed)
I have called the pts wife and she is aware of abx that has been sent to the pharmacy. Nothing further is needed.  She stated that he is doing the airway clearance but she will have him increase as recs by MH.

## 2021-04-19 ENCOUNTER — Inpatient Hospital Stay: Payer: Medicare Other | Attending: Internal Medicine

## 2021-04-19 ENCOUNTER — Other Ambulatory Visit: Payer: Self-pay

## 2021-04-19 ENCOUNTER — Encounter (HOSPITAL_COMMUNITY): Payer: Self-pay

## 2021-04-19 ENCOUNTER — Ambulatory Visit (HOSPITAL_COMMUNITY)
Admission: RE | Admit: 2021-04-19 | Discharge: 2021-04-19 | Disposition: A | Discharge status: Home / Self Care | Payer: Medicare Other | Source: Ambulatory Visit | Attending: Internal Medicine | Admitting: Internal Medicine

## 2021-04-19 DIAGNOSIS — C3490 Malignant neoplasm of unspecified part of unspecified bronchus or lung: Secondary | ICD-10-CM | POA: Insufficient documentation

## 2021-04-19 DIAGNOSIS — J439 Emphysema, unspecified: Secondary | ICD-10-CM | POA: Diagnosis not present

## 2021-04-19 DIAGNOSIS — J479 Bronchiectasis, uncomplicated: Secondary | ICD-10-CM | POA: Diagnosis not present

## 2021-04-19 DIAGNOSIS — I700 Atherosclerosis of aorta: Secondary | ICD-10-CM | POA: Diagnosis not present

## 2021-04-19 LAB — CMP (CANCER CENTER ONLY)
ALT: 8 U/L (ref 0–44)
AST: 14 U/L — ABNORMAL LOW (ref 15–41)
Albumin: 3.5 g/dL (ref 3.5–5.0)
Alkaline Phosphatase: 60 U/L (ref 38–126)
Anion gap: 9 (ref 5–15)
BUN: 22 mg/dL (ref 8–23)
CO2: 32 mmol/L (ref 22–32)
Calcium: 8.6 mg/dL — ABNORMAL LOW (ref 8.9–10.3)
Chloride: 100 mmol/L (ref 98–111)
Creatinine: 0.99 mg/dL (ref 0.61–1.24)
GFR, Estimated: >60 mL/min (ref >60)
Glucose, Bld: 181 mg/dL — ABNORMAL HIGH (ref 70–99)
Potassium: 4.2 mmol/L (ref 3.5–5.1)
Sodium: 141 mmol/L (ref 135–145)
Total Bilirubin: 0.4 mg/dL (ref 0.3–1.2)
Total Protein: 7.2 g/dL (ref 6.5–8.1)

## 2021-04-19 LAB — CBC WITH DIFFERENTIAL (CANCER CENTER ONLY)
Abs Immature Granulocytes: 0.06 10*3/uL (ref 0.00–0.07)
Basophils Absolute: 0 10*3/uL (ref 0.0–0.1)
Basophils Relative: 0 %
Eosinophils Absolute: 0 10*3/uL (ref 0.0–0.5)
Eosinophils Relative: 0 %
HCT: 45.3 % (ref 39.0–52.0)
Hemoglobin: 13.9 g/dL (ref 13.0–17.0)
Immature Granulocytes: 1 %
Lymphocytes Relative: 3 %
Lymphs Abs: 0.3 10*3/uL — ABNORMAL LOW (ref 0.7–4.0)
MCH: 27.2 pg (ref 26.0–34.0)
MCHC: 30.7 g/dL (ref 30.0–36.0)
MCV: 88.6 fL (ref 80.0–100.0)
Monocytes Absolute: 0.4 10*3/uL (ref 0.1–1.0)
Monocytes Relative: 4 %
Neutro Abs: 9.4 10*3/uL — ABNORMAL HIGH (ref 1.7–7.7)
Neutrophils Relative %: 92 %
Platelet Count: 239 10*3/uL (ref 150–400)
RBC: 5.11 MIL/uL (ref 4.22–5.81)
RDW: 14.2 % (ref 11.5–15.5)
WBC Count: 10.3 10*3/uL (ref 4.0–10.5)
nRBC: 0 % (ref 0.0–0.2)

## 2021-04-19 MED ORDER — IOHEXOL 350 MG/ML SOLN
75.0000 mL | Freq: Once | INTRAVENOUS | Status: AC | PRN
  Administered 2021-04-19: 60 mL via INTRAVENOUS

## 2021-04-21 ENCOUNTER — Ambulatory Visit: Payer: Medicare Other | Admitting: Internal Medicine

## 2021-04-26 ENCOUNTER — Ambulatory Visit: Payer: Medicare Other | Admitting: Internal Medicine

## 2021-04-26 ENCOUNTER — Telehealth: Payer: Self-pay | Admitting: Internal Medicine

## 2021-04-26 NOTE — Telephone Encounter (Signed)
Scheduled per sch msg. Called and left msg  

## 2021-04-28 ENCOUNTER — Ambulatory Visit: Payer: Medicare Other | Admitting: Internal Medicine

## 2021-05-13 ENCOUNTER — Telehealth: Payer: Self-pay | Admitting: Internal Medicine

## 2021-05-13 NOTE — Telephone Encounter (Signed)
Scheduled per sch msg. Called, not able to leave msg. Will attempt to call again

## 2021-05-19 ENCOUNTER — Ambulatory Visit: Payer: Medicare Other | Admitting: Internal Medicine

## 2021-05-20 ENCOUNTER — Telehealth: Payer: Self-pay | Admitting: Pulmonary Disease

## 2021-05-20 NOTE — Telephone Encounter (Signed)
I have 2 openings tomorrow can put onto my schedule to be seen in the office for evaluation Please contact office for sooner follow up if symptoms do not improve or worsen or seek emergency care

## 2021-05-20 NOTE — Telephone Encounter (Signed)
Called and spoke with Jeffrey Hines. She verbalized understanding of TP recs. She wanted to know if MH would be available tomorrow. I let her know that he would be in clinic and he is not scheduled to be back in clinic until late December.   I was able to get him scheduled for an OV tomorrow with TP at 3pm.   Nothing further needed at time of call.

## 2021-05-20 NOTE — Telephone Encounter (Signed)
Primary Pulmonologist: MH Last office visit and with whom: 02/12/2021 with Jeffrey Hines What do we see them for (pulmonary problems): bronchiectasis Last OV assessment/plan:  Bronchiectasis with acute exacerbation: Recent growth of Pseudomonas which had grown in the past.  Occasional odd NTM grown in culture but smear negative. Most recent culture 08/2019 negative.  Ongoing sputum production.  Worried that sputum is just backed up, needs additional airway clearance --Continue HTS 3 times daily, albuterol BID, fluter valve BID --Levofloxacin for 14 days given recent Pseudomonas growth as well as improvement after course of Pseudomonas in recent past   Severe COPD: Related to cigarette use and bronchiectasis. Less wheezy today. --Continue stiolto for dual bronchodilation, tolerate perceived side effects of low back pain, consider nebulized LAMA/LABA in future (VA benefit) --Prednisone 20 mg daily, did not tolerate recent attempt to taper, consider retrial in the future --Encouraged pulmonary rehab   Chronic hypoxemic respiratory failure:3L. Related to above. Stable.   Nasal congestion/chronic sinusitis: Ongoing nasal congestion, rhinorrhea, postnasal drip. --Continue antihistamine, azelastine, Flonase   Rash: Appears consistent with yeast infection.  Prescribed nystatin powder.  Does not appear consistent with vasculitis, cellulitis, no evidence of pressure injury.       Return in about 3 months (around 05/14/2021). Was appointment offered to patient (explain)?  Patient's wife wanted recommendations first.    Reason for call: Called and spoke with patient's wife Jeffrey Hines. She stated that the patient has not been feeling like himself for the past week. His cough is more productive with yellowish, greenish phlegm. Denied any fevers or increased wheezing. He is currently having some pain around his right shoulder blade and upper back. Stated that this feels like the pain he had with pleurisy.   He is on 5L  of O2 and at times, it is not enough to keep his sats in the higher 80s. In the mornings his O2 will start off in the 70s despite him using O2 at night. After walking around for a few mins, his O2 tends to increase back to the 80s.   (examples of things to ask: : When did symptoms start? Fever? Cough? Productive? Color to sputum? More sputum than usual? Wheezing? Have you needed increased oxygen? Are you taking your respiratory medications? What over the counter measures have you tried?)  Allergies  Allergen Reactions   Anoro Ellipta [Umeclidinium-Vilanterol] Hives   Prolixin [Fluphenazine]     Severe back pain   Spiriva Respimat [Tiotropium Bromide Monohydrate]    Advair Diskus [Fluticasone-Salmeterol] Anxiety    Immunization History  Administered Date(s) Administered   Fluad Quad(high Dose 65+) 03/20/2020   Influenza Inj Mdck Quad Pf 03/01/2018   Influenza Split 04/14/2015   Influenza Whole 02/27/2014   Influenza, High Dose Seasonal PF 06/02/2016, 04/06/2017, 02/19/2019   Influenza-Unspecified 12/27/2019   Moderna Sars-Covid-2 Vaccination 07/09/2019, 08/06/2019   Pneumococcal Conjugate-13 04/14/2015   Pneumococcal Polysaccharide-23 05/03/2016    She wanted to know if Levaquin could be sent in for him. He took this before a few months and it helped.   Pharmacy is [facility] in [place].   TP, can you please advise since MH is off today? Thanks!

## 2021-05-20 NOTE — Telephone Encounter (Signed)
Pt spouse Clarene Critchley calling for pt- states pt would like Levaquin- states a few months ago pt had pleurisy. Pt currently having back and shoulder blade pain and chest pain(like with pleuritis) Pt currently on 5 L of intermittent oxygen.Pt is having hard time breathing, and keeping his O2 up in mid 80%. Coughing up yellow/green sputum. Please advise [phone]

## 2021-05-21 ENCOUNTER — Ambulatory Visit: Payer: Medicare Other | Admitting: Adult Health

## 2021-05-24 ENCOUNTER — Telehealth: Payer: Self-pay | Admitting: Pulmonary Disease

## 2021-05-24 NOTE — Telephone Encounter (Signed)
Called and spoke with pt's spouse Jeffrey Hines about pt's symptoms. Pt had an appt scheduled with TP Fri. 12/16 but pt ended up cancelling that appt as Jeffrey Hines said that pt was too sick to leave the house to come to the office for that appt. Stated to her that we could get pt in for an appt today 12/19 with TP but she said that they would wait until pt sees Dr.Hunsucker 12/27.  Stated to her if pt's symptoms got worse prior to that appt to call the office and we could get him scheduled to see another one of our providers and she verbalized understanding. Nothing further needed.

## 2021-05-27 ENCOUNTER — Telehealth: Payer: Self-pay | Admitting: Pulmonary Disease

## 2021-05-27 ENCOUNTER — Telehealth: Payer: Self-pay | Admitting: *Deleted

## 2021-05-27 ENCOUNTER — Encounter: Payer: Self-pay | Admitting: Adult Health

## 2021-05-27 ENCOUNTER — Other Ambulatory Visit: Payer: Self-pay

## 2021-05-27 ENCOUNTER — Telehealth (INDEPENDENT_AMBULATORY_CARE_PROVIDER_SITE_OTHER): Payer: Medicare Other | Admitting: Adult Health

## 2021-05-27 DIAGNOSIS — J449 Chronic obstructive pulmonary disease, unspecified: Secondary | ICD-10-CM

## 2021-05-27 DIAGNOSIS — J9611 Chronic respiratory failure with hypoxia: Secondary | ICD-10-CM | POA: Diagnosis not present

## 2021-05-27 DIAGNOSIS — J441 Chronic obstructive pulmonary disease with (acute) exacerbation: Secondary | ICD-10-CM | POA: Diagnosis not present

## 2021-05-27 MED ORDER — LEVOFLOXACIN 500 MG PO TABS: 500.0000 mg | ORAL_TABLET | Freq: Every day | ORAL | 0 refills | Status: AC

## 2021-05-27 NOTE — Telephone Encounter (Signed)
Pt spouse concerned because they received a letter from the [place]- stating that [employer] is recalling some of their nebulizer solutions- including their saline. Pt does get saline from this company and spouse is concerned as pt's breathing has deteriorated over the last few weeks.Please advise [phone]

## 2021-05-27 NOTE — Telephone Encounter (Signed)
ATC Dwyane Luo with [employer] to let him know that we will be placing an order for an oxygen concentrator for this patient.

## 2021-05-27 NOTE — Telephone Encounter (Signed)
Pt spouse concerned because they received a letter from the [place]- stating that [employer] is recalling some of their nebulizer solutions- including their saline. Pt does get saline from this company and spouse is concerned as pt's breathing has deteriorated over the last few weeks. Pt spouse then adds that pt needs to be seen sooner than Tuesday because his breathing is deteriorating and spouse thinks pt will either be dead or in the er. Pt spouse did not elaborate why she thinks this. Please advise [phone]

## 2021-05-27 NOTE — Telephone Encounter (Signed)
Orders for [employer] oxygen concentrator faxed to Dwyane Luo at [phone].  Order also sent electronically.   Nothing further needed.
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
## 2021-05-27 NOTE — Patient Instructions (Signed)
Continue on Stiolto 2 puffs daily Mucinex DM twice daily as needed for cough and congestion Albuterol nebulizer as needed Levaquin 500 mg daily for 7 days Take probiotics daily Continue on oxygen 3 L.  Order to homecare company to supply stationary concentrator to deliver continuous flow oxygen.  Please use this at home and at bedtime.  May use your portable oxygen at 3 L pulsing when you are mobile or away from home please do not use this at nighttime Follow-up with Dr. Silas Flood in 2 to 3 weeks and as needed Please contact office for sooner follow up if symptoms do not improve or worsen or seek emergency care

## 2021-05-27 NOTE — Telephone Encounter (Signed)
Called and spoke with Clarene Critchley. She stated that they received a letter from the [place] stating that the hypertonic solution they have been using is currently under a recall. [employer] is the manufacturer. Per Clarene Critchley, the recall letter stated that the solution's sterility could not be verified and it is possible that the solution could be compromised with pseudomonas aeruginosa.   She is concerned because over the past few weeks, his breathing has rapidly deteriorated and the stomach pain has increased. He is scheduled for an appt with [facility] on Tuesday but she does not want him to wait that long. I offered an in office appt for today and she stated that he is too weak to leave the house. I was able to get him scheduled for a video visit this afternoon at 3pm with TP. She has requested that we send the video link to her email :[email or web address]   She wanted to know if there was anything he could be prescribed before the video visit. I informed that it would be highly unlikely but I would still ask. She requested that I send the message to TP.   TP, can you please advise? Thanks.

## 2021-06-01 ENCOUNTER — Other Ambulatory Visit: Payer: Self-pay

## 2021-06-01 ENCOUNTER — Ambulatory Visit (INDEPENDENT_AMBULATORY_CARE_PROVIDER_SITE_OTHER): Payer: Medicare Other | Admitting: Pulmonary Disease

## 2021-06-01 ENCOUNTER — Encounter: Payer: Self-pay | Admitting: Pulmonary Disease

## 2021-06-01 VITALS — BP 122/70 | HR 117 | Temp 98.0°F | Ht 71.0 in | Wt 154.6 lb

## 2021-06-01 DIAGNOSIS — J471 Bronchiectasis with (acute) exacerbation: Secondary | ICD-10-CM

## 2021-06-01 DIAGNOSIS — R0902 Hypoxemia: Secondary | ICD-10-CM | POA: Diagnosis not present

## 2021-06-01 NOTE — Telephone Encounter (Signed)
Jimmy from [facility] called and states the order was missing signature and settings. Jimmy faxed over a request that is refilled and only needs settings, signature and date. Laverna Peace states that if patient does not need a portable concentrator then it does not have to be filled out. Patient is being seen by Dr. Silas Flood today at 4:30pm Form placed in Tammy Parretts box as her name is on the paperwork.  Please advise.

## 2021-06-01 NOTE — Patient Instructions (Signed)
Nice to see you again  No medication changes today  Let me know if there is any trouble with the oxygen  Return to clinic in 2 months or sooner as needed

## 2021-06-01 NOTE — Telephone Encounter (Signed)
Pt seen on 12/22 for video visit. Will close encounter.

## 2021-06-02 NOTE — Telephone Encounter (Signed)
Form singed and faxed  I spoke with Jeffrey Hines and notified that this was done  He received this and nothing further needed

## 2021-06-02 NOTE — Telephone Encounter (Signed)
Jimmy from [facility] called back and states he needs the order request back as soon as possible- they can overnight the concentrator today if they are able to get it back within the next hour and a half. Laverna Peace states that Dr. Silas Flood can sign the form as long as his name and signature are on the form.   Please advise.

## 2021-06-02 NOTE — Telephone Encounter (Signed)
Form given to [place] to sign  Will hold until I have faxed over the signed form

## 2021-06-08 ENCOUNTER — Telehealth: Payer: Self-pay | Admitting: Pulmonary Disease

## 2021-06-08 MED ORDER — LEVOFLOXACIN 750 MG PO TABS: 750.0000 mg | ORAL_TABLET | Freq: Every day | ORAL | 0 refills | Status: DC

## 2021-06-08 NOTE — Telephone Encounter (Signed)
Called and spoke with Clarene Critchley letting her know recs per [place] and she verbalized understanding. Meds  have been sent to preferred pharmacy. Nothing further needed.

## 2021-06-08 NOTE — Telephone Encounter (Signed)
Would recommend he use continuous O2 6L with exertion. Please send levofloxacin 750 mg daily x 7 days, refills 0.

## 2021-06-08 NOTE — Telephone Encounter (Signed)
Called and spoke with pt's spouse Clarene Critchley who states that pt is still coughing up dark yellow phlegm and she wants to know if pt could have a higher dose of levaquin prescribed to see if it would help clear this up.  Clarene Critchley also stated that pt did receive the continuous O2 concentrator and said that pt has been using 4L O2 with it. She states when pt is sitting at rest, his sats have been 92-93% on the 4L continuous but when pt goes to ambulate, his sats have been dropping to the low 80s on the 4L continuous.  Clarene Critchley said if pt uses the POC which is pulsed flow, pt will use between 4-5L pulsed and with this, pt's sats have been dropping to the 60s-70s on the pulsed O2.  Dr. Silas Flood, please advise on this what you recommend.

## 2021-06-09 ENCOUNTER — Other Ambulatory Visit: Payer: Self-pay

## 2021-06-09 ENCOUNTER — Inpatient Hospital Stay (HOSPITAL_COMMUNITY)
Admission: EM | Admit: 2021-06-09 | Discharge: 2021-06-16 | DRG: 193 | Disposition: A | Discharge status: Home / Self Care | Payer: Medicare Other | Attending: Internal Medicine | Admitting: Internal Medicine

## 2021-06-09 ENCOUNTER — Encounter (HOSPITAL_COMMUNITY): Payer: Self-pay | Admitting: Emergency Medicine

## 2021-06-09 ENCOUNTER — Telehealth: Payer: Self-pay | Admitting: Pulmonary Disease

## 2021-06-09 ENCOUNTER — Emergency Department (HOSPITAL_COMMUNITY): Payer: Medicare Other

## 2021-06-09 ENCOUNTER — Inpatient Hospital Stay (HOSPITAL_COMMUNITY): Payer: Medicare Other

## 2021-06-09 DIAGNOSIS — I10 Essential (primary) hypertension: Secondary | ICD-10-CM | POA: Diagnosis not present

## 2021-06-09 DIAGNOSIS — Z85118 Personal history of other malignant neoplasm of bronchus and lung: Secondary | ICD-10-CM | POA: Diagnosis not present

## 2021-06-09 DIAGNOSIS — J449 Chronic obstructive pulmonary disease, unspecified: Secondary | ICD-10-CM | POA: Diagnosis present

## 2021-06-09 DIAGNOSIS — I2693 Single subsegmental pulmonary embolism without acute cor pulmonale: Secondary | ICD-10-CM | POA: Diagnosis present

## 2021-06-09 DIAGNOSIS — J80 Acute respiratory distress syndrome: Secondary | ICD-10-CM | POA: Diagnosis not present

## 2021-06-09 DIAGNOSIS — K219 Gastro-esophageal reflux disease without esophagitis: Secondary | ICD-10-CM | POA: Diagnosis present

## 2021-06-09 DIAGNOSIS — Z20822 Contact with and (suspected) exposure to covid-19: Secondary | ICD-10-CM | POA: Diagnosis present

## 2021-06-09 DIAGNOSIS — J9611 Chronic respiratory failure with hypoxia: Secondary | ICD-10-CM | POA: Diagnosis not present

## 2021-06-09 DIAGNOSIS — J9621 Acute and chronic respiratory failure with hypoxia: Secondary | ICD-10-CM | POA: Diagnosis present

## 2021-06-09 DIAGNOSIS — J470 Bronchiectasis with acute lower respiratory infection: Secondary | ICD-10-CM | POA: Diagnosis present

## 2021-06-09 DIAGNOSIS — Z801 Family history of malignant neoplasm of trachea, bronchus and lung: Secondary | ICD-10-CM | POA: Diagnosis not present

## 2021-06-09 DIAGNOSIS — R0902 Hypoxemia: Secondary | ICD-10-CM | POA: Diagnosis not present

## 2021-06-09 DIAGNOSIS — J4489 Other specified chronic obstructive pulmonary disease: Secondary | ICD-10-CM | POA: Diagnosis present

## 2021-06-09 DIAGNOSIS — Z7952 Long term (current) use of systemic steroids: Secondary | ICD-10-CM

## 2021-06-09 DIAGNOSIS — Z902 Acquired absence of lung [part of]: Secondary | ICD-10-CM | POA: Diagnosis not present

## 2021-06-09 DIAGNOSIS — G9341 Metabolic encephalopathy: Secondary | ICD-10-CM | POA: Diagnosis present

## 2021-06-09 DIAGNOSIS — J441 Chronic obstructive pulmonary disease with (acute) exacerbation: Secondary | ICD-10-CM | POA: Diagnosis not present

## 2021-06-09 DIAGNOSIS — G8929 Other chronic pain: Secondary | ICD-10-CM | POA: Diagnosis present

## 2021-06-09 DIAGNOSIS — J9811 Atelectasis: Secondary | ICD-10-CM | POA: Diagnosis present

## 2021-06-09 DIAGNOSIS — J9622 Acute and chronic respiratory failure with hypercapnia: Secondary | ICD-10-CM | POA: Diagnosis present

## 2021-06-09 DIAGNOSIS — R000 Tachycardia, unspecified: Secondary | ICD-10-CM | POA: Diagnosis not present

## 2021-06-09 DIAGNOSIS — Z888 Allergy status to other drugs, medicaments and biological substances status: Secondary | ICD-10-CM

## 2021-06-09 DIAGNOSIS — M545 Low back pain, unspecified: Secondary | ICD-10-CM | POA: Diagnosis present

## 2021-06-09 DIAGNOSIS — Z79899 Other long term (current) drug therapy: Secondary | ICD-10-CM | POA: Diagnosis not present

## 2021-06-09 DIAGNOSIS — J189 Pneumonia, unspecified organism: Secondary | ICD-10-CM | POA: Diagnosis not present

## 2021-06-09 DIAGNOSIS — I700 Atherosclerosis of aorta: Secondary | ICD-10-CM | POA: Diagnosis not present

## 2021-06-09 DIAGNOSIS — G2581 Restless legs syndrome: Secondary | ICD-10-CM | POA: Diagnosis not present

## 2021-06-09 DIAGNOSIS — J471 Bronchiectasis with (acute) exacerbation: Secondary | ICD-10-CM | POA: Diagnosis not present

## 2021-06-09 DIAGNOSIS — J90 Pleural effusion, not elsewhere classified: Secondary | ICD-10-CM | POA: Diagnosis not present

## 2021-06-09 DIAGNOSIS — Z87891 Personal history of nicotine dependence: Secondary | ICD-10-CM | POA: Diagnosis not present

## 2021-06-09 DIAGNOSIS — Z86711 Personal history of pulmonary embolism: Secondary | ICD-10-CM | POA: Diagnosis present

## 2021-06-09 DIAGNOSIS — I2699 Other pulmonary embolism without acute cor pulmonale: Secondary | ICD-10-CM | POA: Diagnosis present

## 2021-06-09 DIAGNOSIS — Z9981 Dependence on supplemental oxygen: Secondary | ICD-10-CM

## 2021-06-09 DIAGNOSIS — L8990 Pressure ulcer of unspecified site, unspecified stage: Secondary | ICD-10-CM | POA: Insufficient documentation

## 2021-06-09 DIAGNOSIS — J439 Emphysema, unspecified: Secondary | ICD-10-CM | POA: Diagnosis not present

## 2021-06-09 DIAGNOSIS — L89151 Pressure ulcer of sacral region, stage 1: Secondary | ICD-10-CM | POA: Diagnosis present

## 2021-06-09 DIAGNOSIS — K5900 Constipation, unspecified: Secondary | ICD-10-CM | POA: Diagnosis present

## 2021-06-09 DIAGNOSIS — R0602 Shortness of breath: Secondary | ICD-10-CM | POA: Diagnosis not present

## 2021-06-09 DIAGNOSIS — Z743 Need for continuous supervision: Secondary | ICD-10-CM | POA: Diagnosis not present

## 2021-06-09 DIAGNOSIS — F411 Generalized anxiety disorder: Secondary | ICD-10-CM | POA: Diagnosis present

## 2021-06-09 LAB — URINALYSIS, ROUTINE W REFLEX MICROSCOPIC
Bilirubin Urine: NEGATIVE
Glucose, UA: NEGATIVE mg/dL
Hgb urine dipstick: NEGATIVE
Ketones, ur: 5 mg/dL — AB
Leukocytes,Ua: NEGATIVE
Nitrite: NEGATIVE
Protein, ur: NEGATIVE mg/dL
Specific Gravity, Urine: 1.018 (ref 1.005–1.030)
pH: 7 (ref 5.0–8.0)

## 2021-06-09 LAB — COMPREHENSIVE METABOLIC PANEL
ALT: 13 U/L (ref 0–44)
AST: 17 U/L (ref 15–41)
Albumin: 3.6 g/dL (ref 3.5–5.0)
Alkaline Phosphatase: 61 U/L (ref 38–126)
Anion gap: 9 (ref 5–15)
BUN: 23 mg/dL (ref 8–23)
CO2: 41 mmol/L — ABNORMAL HIGH (ref 22–32)
Calcium: 9.3 mg/dL (ref 8.9–10.3)
Chloride: 87 mmol/L — ABNORMAL LOW (ref 98–111)
Creatinine, Ser: 0.76 mg/dL (ref 0.61–1.24)
GFR, Estimated: >60 mL/min (ref >60)
Glucose, Bld: 111 mg/dL — ABNORMAL HIGH (ref 70–99)
Potassium: 4.6 mmol/L (ref 3.5–5.1)
Sodium: 137 mmol/L (ref 135–145)
Total Bilirubin: 0.9 mg/dL (ref 0.3–1.2)
Total Protein: 7.9 g/dL (ref 6.5–8.1)

## 2021-06-09 LAB — BASIC METABOLIC PANEL
Anion gap: 9 (ref 5–15)
BUN: 24 mg/dL — ABNORMAL HIGH (ref 8–23)
CO2: 38 mmol/L — ABNORMAL HIGH (ref 22–32)
Calcium: 9.1 mg/dL (ref 8.9–10.3)
Chloride: 89 mmol/L — ABNORMAL LOW (ref 98–111)
Creatinine, Ser: 0.77 mg/dL (ref 0.61–1.24)
GFR, Estimated: >60 mL/min (ref >60)
Glucose, Bld: 112 mg/dL — ABNORMAL HIGH (ref 70–99)
Potassium: 4.9 mmol/L (ref 3.5–5.1)
Sodium: 136 mmol/L (ref 135–145)

## 2021-06-09 LAB — CBC
HCT: 51.3 % (ref 39.0–52.0)
Hemoglobin: 15.4 g/dL (ref 13.0–17.0)
MCH: 27.2 pg (ref 26.0–34.0)
MCHC: 30 g/dL (ref 30.0–36.0)
MCV: 90.6 fL (ref 80.0–100.0)
Platelets: 250 10*3/uL (ref 150–400)
RBC: 5.66 MIL/uL (ref 4.22–5.81)
RDW: 13.8 % (ref 11.5–15.5)
WBC: 10.2 10*3/uL (ref 4.0–10.5)
nRBC: 0 % (ref 0.0–0.2)

## 2021-06-09 LAB — RESP PANEL BY RT-PCR (FLU A&B, COVID) ARPGX2
Influenza A by PCR: NEGATIVE
Influenza B by PCR: NEGATIVE
SARS Coronavirus 2 by RT PCR: NEGATIVE

## 2021-06-09 LAB — LACTIC ACID, PLASMA
Lactic Acid, Venous: 1.4 mmol/L (ref 0.5–1.9)
Lactic Acid, Venous: 1.3 mmol/L (ref 0.5–1.9)

## 2021-06-09 LAB — PROTIME-INR
INR: 1.1 (ref 0.8–1.2)
Prothrombin Time: 14.2 seconds (ref 11.4–15.2)

## 2021-06-09 MED ORDER — BUDESONIDE 0.5 MG/2ML IN SUSP
2.0000 mg | Freq: Two times a day (BID) | RESPIRATORY_TRACT | Status: DC
  Administered 2021-06-09: 2 mg via RESPIRATORY_TRACT
  Filled 2021-06-09: qty 8

## 2021-06-09 MED ORDER — IOHEXOL 350 MG/ML SOLN
75.0000 mL | Freq: Once | INTRAVENOUS | Status: AC | PRN
  Administered 2021-06-09: 75 mL via INTRAVENOUS

## 2021-06-09 MED ORDER — FLUTICASONE PROPIONATE 50 MCG/ACT NA SUSP
1.0000 | Freq: Every day | NASAL | Status: DC | PRN
  Filled 2021-06-09: qty 16

## 2021-06-09 MED ORDER — ONDANSETRON HCL 4 MG PO TABS: 4.0000 mg | ORAL_TABLET | Freq: Four times a day (QID) | ORAL | Status: DC | PRN

## 2021-06-09 MED ORDER — ONDANSETRON HCL 4 MG/2ML IJ SOLN: 4.0000 mg | Freq: Four times a day (QID) | INTRAMUSCULAR | Status: DC | PRN

## 2021-06-09 MED ORDER — ENOXAPARIN SODIUM 40 MG/0.4ML IJ SOSY
40.0000 mg | PREFILLED_SYRINGE | INTRAMUSCULAR | Status: DC
  Filled 2021-06-09: qty 0.4

## 2021-06-09 MED ORDER — IPRATROPIUM-ALBUTEROL 0.5-2.5 (3) MG/3ML IN SOLN
3.0000 mL | Freq: Four times a day (QID) | RESPIRATORY_TRACT | Status: DC
  Administered 2021-06-09 – 2021-06-14 (×18): 3 mL via RESPIRATORY_TRACT
  Filled 2021-06-09 (×19): qty 3

## 2021-06-09 MED ORDER — SODIUM CHLORIDE 0.9 % IV SOLN
2.0000 g | Freq: Once | INTRAVENOUS | Status: AC
  Administered 2021-06-09: 2 g via INTRAVENOUS
  Filled 2021-06-09: qty 2

## 2021-06-09 MED ORDER — SODIUM CHLORIDE 3 % IN NEBU
4.0000 mL | INHALATION_SOLUTION | Freq: Two times a day (BID) | RESPIRATORY_TRACT | Status: DC
  Administered 2021-06-09: 4 mL via RESPIRATORY_TRACT
  Filled 2021-06-09 (×2): qty 4

## 2021-06-09 MED ORDER — TAMSULOSIN HCL 0.4 MG PO CAPS
0.4000 mg | ORAL_CAPSULE | Freq: Every day | ORAL | Status: DC
Start: 2021-06-10 — End: 2021-06-16
  Administered 2021-06-10 – 2021-06-16 (×7): 0.4 mg via ORAL
  Filled 2021-06-09 (×7): qty 1

## 2021-06-09 MED ORDER — GUAIFENESIN ER 600 MG PO TB12
600.0000 mg | ORAL_TABLET | Freq: Two times a day (BID) | ORAL | Status: DC
  Administered 2021-06-09 – 2021-06-16 (×14): 600 mg via ORAL
  Filled 2021-06-09 (×14): qty 1

## 2021-06-09 MED ORDER — ROPINIROLE HCL 1 MG PO TABS
2.0000 mg | ORAL_TABLET | Freq: Every day | ORAL | Status: DC
  Administered 2021-06-09 – 2021-06-15 (×7): 2 mg via ORAL
  Filled 2021-06-09 (×8): qty 2

## 2021-06-09 MED ORDER — CLONAZEPAM 0.5 MG PO TABS
0.5000 mg | ORAL_TABLET | Freq: Two times a day (BID) | ORAL | Status: DC
  Administered 2021-06-09 – 2021-06-10 (×3): 0.5 mg via ORAL
  Filled 2021-06-09 (×3): qty 1

## 2021-06-09 MED ORDER — FUROSEMIDE 40 MG PO TABS: 20.0000 mg | ORAL_TABLET | ORAL | Status: DC

## 2021-06-09 MED ORDER — SODIUM CHLORIDE 0.9 % IV SOLN
500.0000 mg | INTRAVENOUS | Status: AC
  Administered 2021-06-09 – 2021-06-14 (×5): 500 mg via INTRAVENOUS
  Filled 2021-06-09 (×5): qty 5

## 2021-06-09 MED ORDER — ALBUTEROL SULFATE (2.5 MG/3ML) 0.083% IN NEBU
2.5000 mg | INHALATION_SOLUTION | RESPIRATORY_TRACT | Status: DC | PRN
  Administered 2021-06-10: 2.5 mg via RESPIRATORY_TRACT
  Filled 2021-06-09: qty 3

## 2021-06-09 MED ORDER — PREDNISONE 20 MG PO TABS
40.0000 mg | ORAL_TABLET | Freq: Every day | ORAL | Status: AC
  Administered 2021-06-10 – 2021-06-13 (×4): 40 mg via ORAL
  Filled 2021-06-09 (×4): qty 2

## 2021-06-09 MED ORDER — PIPERACILLIN-TAZOBACTAM 3.375 G IVPB
3.3750 g | Freq: Three times a day (TID) | INTRAVENOUS | Status: DC
  Administered 2021-06-10 – 2021-06-16 (×19): 3.375 g via INTRAVENOUS
  Filled 2021-06-09 (×20): qty 50

## 2021-06-09 MED ORDER — ACETAMINOPHEN 500 MG PO TABS: 1000.0000 mg | ORAL_TABLET | Freq: Three times a day (TID) | ORAL | Status: DC | PRN

## 2021-06-09 MED ORDER — METHYLPREDNISOLONE SODIUM SUCC 125 MG IJ SOLR
120.0000 mg | INTRAMUSCULAR | Status: AC
  Administered 2021-06-09: 120 mg via INTRAVENOUS
  Filled 2021-06-09: qty 2
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
## 2021-06-09 NOTE — Telephone Encounter (Signed)
Called and spoke with Clarene Critchley. She stated that the patient has been struggling to keep his O2 levels above 88%. Even with 5L continuous, his O2 remains at 84%. With exertion, it drops into the 70s. He was able to start the Levaquin yesterday.   I advised her that since 5L is not helping him, it might be best for him to go the hospital. She wanted to bring him here to the office. I advised her it would be best for her to call 911 so that EMS will be able to take care of him. She is aware that I will let MH know.   Nothing further needed at time of call.

## 2021-06-09 NOTE — ED Notes (Signed)
Patient provided with sandwich per request.  Admit MD at bedside

## 2021-06-09 NOTE — ED Notes (Signed)
Pt's reports his HR is always elevated.

## 2021-06-09 NOTE — ED Triage Notes (Signed)
Pt BIBA.  Per EMS pt presents with c/o SOB x 2 days. Wife called EMS due to patient's oxygen dropping to 70's when pt is up moving around. Pt has hx of end stage COPD and does wear intermittent O2 at  [facility], however on Monday pt's PCP changed O2 to continuous at [facility].  Pt denies any other complaints.

## 2021-06-09 NOTE — Progress Notes (Signed)
@Patient  ID: Jeffrey Hines, male    DOB: 07/27/1945, 76 y.o.   MRN: [id number]  Chief Complaint  Patient presents with   Follow-up    Pt states that he is having left sided lung pain, and the pain goes into his neck and his back. Is on levoquin for 6 days now for SOB and the lung pain. Mucus is very dark per wife.     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   Since last visit seen by [facility].  Concern for exacerbation placed on levofloxacin.  Since that visit, mild improvement in pleuritic chest pain.  Some improvement in cough.  Notably more hypoxemic.  Not using to 4 L at rest and more with exertion.  Pulse portable oxygen concentrator does not keep up any desats with exertion.  Discussed getting home oxygen concentrator which is in the works.  Again discussed ongoing care and hope for improvement with this acute decline but again briefly discussed hospice services.  Patient feels they are not needed at this time.  Caregiver and wife also felt like things are being adequately managed at this time.   Questionaires / Pulmonary Flowsheets:   ACT:  No flowsheet data found.  MMRC: No flowsheet data found.  Epworth:  No flowsheet data found.  Tests:   FENO:  No results found for: NITRICOXIDE  PFT: PFT Results Latest Ref Rng & Units 11/07/2014 08/07/2014  FVC-Pre L 4.89 2.93  FVC-Predicted Pre % 104 62  FVC-Post L 5.38 3.44  FVC-Predicted Post % 115 73  Pre FEV1/FVC % % 46 49  Post FEV1/FCV % % 46 50  FEV1-Pre L 2.24 1.44  FEV1-Predicted Pre % 64 41  FEV1-Post L 2.47 1.71  DLCO uncorrected ml/min/mmHg 10.62 11.13  DLCO UNC% % 31 33  DLCO corrected ml/min/mmHg 11.70 11.29  DLCO COR %Predicted % 34 33  DLVA Predicted %  40 55  TLC L 7.79 6.73  TLC % Predicted % 107 93  RV % Predicted % 101 131  Personally reviewed and interpreted as moderate to severe obstruction with severely reduced DLCO  WALK:  SIX MIN WALK 04/28/2015 10/28/2014  Supplimental Oxygen during Test? (L/min) No No  Tech Comments: - pt walked a slow pace, tolerated walk well.     Imaging: Reviewed and as per EMR and discussion in this note  Lab Results: Extensively reviewed including multiple sputum and AFB cultures CBC    Component Value Date/Time   WBC 10.2 06/09/2021 1616   RBC 5.66 06/09/2021 1616   HGB 15.4 06/09/2021 1616   HGB 13.9 04/19/2021 1126   HGB 12.9 (L) 02/08/2017 1343   HCT 51.3 06/09/2021 1616   HCT 38.9 02/08/2017 1343   PLT 250 06/09/2021 1616   PLT 239 04/19/2021 1126   PLT 218 02/08/2017 1343   MCV 90.6 06/09/2021 1616   MCV 85.7 02/08/2017 1343   MCH 27.2 06/09/2021 1616   MCHC 30.0 06/09/2021 1616   RDW 13.8 06/09/2021 1616   RDW 12.8 02/08/2017 1343   LYMPHSABS 0.3 (L) 04/19/2021 1126   LYMPHSABS 0.9 02/08/2017 1343   MONOABS 0.4 04/19/2021 1126   MONOABS 0.5 02/08/2017 1343   EOSABS 0.0 04/19/2021 1126   EOSABS 0.1 02/08/2017 1343   BASOSABS 0.0 04/19/2021 1126  BASOSABS 0.0 02/08/2017 1343    BMET    Component Value Date/Time   NA 137 06/09/2021 1616   NA 142 02/08/2017 1344   K 4.6 06/09/2021 1616   K 3.5 02/08/2017 1344   CL 87 (L) 06/09/2021 1616   CO2 41 (H) 06/09/2021 1616   CO2 28 02/08/2017 1344   GLUCOSE 111 (H) 06/09/2021 1616   GLUCOSE 113 02/08/2017 1344   BUN 23 06/09/2021 1616   BUN 13.5 02/08/2017 1344   CREATININE 0.76 06/09/2021 1616   CREATININE 0.99 04/19/2021 1126   CREATININE 0.9 02/08/2017 1344   CALCIUM 9.3 06/09/2021 1616   CALCIUM 9.0 02/08/2017 1344   GFRNONAA >60 06/09/2021 1616   GFRNONAA >60 04/19/2021 1126   GFRAA >60 01/17/2020 1355    BNP    Component Value Date/Time   BNP 30.5 08/03/2014 1359    ProBNP    Component Value Date/Time    PROBNP 101.0 (H) 01/07/2020 1641    Specialty Problems       Pulmonary Problems   COPD with chronic bronchitis ([place])    March 2016 pulmonary function test> ratio 50%, FEV1 L (49% predicted, 18% change with bronchodilator), total lung capacity 6.73 L (93% predicted), DLCO 11.13 (33% predicted). June 2016 pulmonary function testing ratio 46%, FEV1 2.47 L (71% Pred), total lung capacity 7.79 L (107% predicted), DLCO 10.62 (31% predicted). 11/2014 ONO < 88% 2 hours      Lung mass   Post-obstruction pneumonia due to Pseudomonas aeruginosa    Endobronchial mass causing LUL obstruction      Squamous cell carcinoma of lung, stage II ([place])    - Status post left upper lobectomy summer 2016 - seen at [facility] and underwent a bronchoscopy on March 16 2017 with a balloon dilation of the focal area of acquired bronchomalacia and stenosis of the left lower lobe orifice this was felt due to torsion of the lung post lobectomy       Chronic respiratory failure with hypoxia (HCC)    2L O2 qHS 05/2015 ONO RA> less than 88% 1 hour, 31 minutes, lowest 77%      Lung cancer ([place])   Cough   Community acquired pneumonia of left lung   Hypoxia   Pneumonia due to Pseudomonas species ([place])   Postobstructive pneumonia   Bronchiectasis with (acute) exacerbation ([place])   Bronchiectasis without complication ([place])   CAP (community acquired pneumonia)   Pneumonia    Allergies  Allergen Reactions   Anoro Ellipta [Umeclidinium-Vilanterol] Hives   Prolixin [Fluphenazine]     Severe back pain   Spiriva Respimat [Tiotropium Bromide Monohydrate]    Advair Diskus [Fluticasone-Salmeterol] Anxiety    Immunization History  Administered Date(s) Administered   Fluad Quad(high Dose 65+) 03/20/2020, 03/17/2021   Influenza Inj Mdck Quad Pf 03/01/2018   Influenza Split 05/27/2013, 02/25/2014, 04/14/2015   Influenza Whole 02/27/2014   Influenza, High Dose Seasonal PF 06/02/2016, 03/06/2017, 04/06/2017,  02/19/2019   Influenza-Unspecified 03/25/1998, 04/19/1999, 05/06/2006, 02/26/2007, 04/07/2008, 03/17/2009, 04/05/2010, 05/06/2011, 06/14/2012, 02/25/2014, 04/13/2015, 12/27/2019   Moderna Covid-19 Vaccine Bivalent Booster 72yrs & up 04/23/2021   Moderna Sars-Cov-2 Peds vaccine 68yrs thru 16yrs 04/23/2021   Moderna Sars-Covid-2 Vaccination 07/09/2019, 08/06/2019, 09/17/2020   Pneumococcal Conjugate-13 06/12/2014, 04/14/2015   Pneumococcal Polysaccharide-23 05/03/2016, 05/24/2016   Pneumococcal-Unspecified 04/19/1999   Zoster Recombinat (Shingrix) 12/27/2019, 02/27/2020    Past Medical History:  Diagnosis Date   Anxiety    Cancer ([place])    Chronic lower back pain  Closed head injury 1998   Constipation due to opioid therapy    COPD (chronic obstructive pulmonary disease) (HCC)    COPD with chronic bronchitis (HCC)    Full dentures    GERD (gastroesophageal reflux disease)    Hemoptysis 08/03/2014   Hypertension    Multiple rib fractures 1998   left side   On supplemental oxygen therapy    2L of O2 at night   Post-obstruction pneumonia due to Pseudomonas aeruginosa 08/07/2014   Endobronchial mass causing LUL obstruction   Radiation 08/25/14-09/29/14   left upper central lung 45 Gy   Seizures (HCC)    Shortness of breath dyspnea    Shoulder dislocation 1998   left   Spontaneous pneumothorax 08/03/2014   Left side 1st time episode spontaneous pneumothorax associated with acute flare of COPD    Tobacco abuse     Tobacco History: Social History   Tobacco Use  Smoking Status Former   Packs/day: 0.50   Years: 50.00   Pack years: 25.00   Types: Cigarettes   Quit date: 08/03/2014   Years since quitting: 6.8  Smokeless Tobacco Never   Counseling given: Not Answered   Continue to not smoke  No facility-administered encounter medications on file as of 06/01/2021.   Outpatient Encounter Medications as of 06/01/2021  Medication Sig   acetaminophen (TYLENOL) 500 MG tablet 2  tabs   albuterol (PROVENTIL) (2.5 MG/3ML) 0.083% nebulizer solution Take 3 mLs (2.5 mg total) by nebulization every 2 (two) hours as needed for wheezing or shortness of breath.   azelastine (ASTELIN) 0.1 % nasal spray Place 1 spray into the nose 2 (two) times daily.   BL GLUCOSAMINE-CHONDROITIN PO Take by mouth.   clonazePAM (KLONOPIN) 0.5 MG tablet Take 0.5 mg by mouth 2 (two) times daily.    fluticasone (FLONASE) 50 MCG/ACT nasal spray Place 1 spray into both nostrils daily.   furosemide (LASIX) 20 MG tablet Take 1 tablet (20 mg total) by mouth daily. (Patient taking differently: Take 20 mg by mouth daily. 10mg  on Monday Wednesday Thursday and Saturdays 20mg  Tuesday Friday and Sunday)   guaiFENesin (MUCINEX) 600 MG 12 hr tablet Take 1 tablet (600 mg total) by mouth 2 (two) times daily.   [EXPIRED] levofloxacin (LEVAQUIN) 500 MG tablet Take 1 tablet (500 mg total) by mouth daily for 7 days.   loratadine (CLARITIN) 10 MG tablet Take 10 mg by mouth in the morning and at bedtime.    OXYGEN 5 L/min See admin instructions.   predniSONE (DELTASONE) 20 MG tablet Take 1 tablet (20 mg total) by mouth daily with breakfast.   Probiotic Product (PROBIOTIC-10 PO) Take by mouth at bedtime.   Respiratory Therapy Supplies (FLUTTER) DEVI 1 Device by Does not apply route as directed.   rOPINIRole (REQUIP) 1 MG tablet Take 2 mg by mouth at bedtime.   sodium chloride 0.9 % nebulizer solution Inhale into the lungs.   sodium chloride HYPERTONIC 3 % nebulizer solution Take 4 mLs by nebulization 2 (two) times daily as needed for cough.    tamsulosin (FLOMAX) 0.4 MG CAPS capsule 1 capsule   Tiotropium Bromide-Olodaterol (STIOLTO RESPIMAT) 2.5-2.5 MCG/ACT AERS Inhale 2 puffs into the lungs daily.    Turmeric 500 MG CAPS Take 1 capsule by mouth in the morning and at bedtime.       Physical Exam  BP 122/70 (BP Location: Left Arm, Patient Position: Sitting, Cuff Size: Normal)    Pulse (!) 117    Temp 98 F (  36.7 C)  (Oral)    Ht 5\' 11"  (1.803 m)    Wt 154 lb 9.6 oz (70.1 kg)    SpO2 94% Comment: 5L [place]   BMI 21.56 kg/m   Wt Readings from Last 5 Encounters:  06/09/21 154 lb 9.6 oz (70.1 kg)  06/01/21 154 lb 9.6 oz (70.1 kg)  02/12/21 159 lb (72.1 kg)  11/04/20 153 lb 6.4 oz (69.6 kg)  10/19/20 156 lb 1.6 oz (70.8 kg)    BMI Readings from Last 5 Encounters:  06/09/21 21.56 kg/m  06/01/21 21.56 kg/m  02/12/21 22.18 kg/m  11/04/20 21.39 kg/m  10/19/20 21.77 kg/m     Physical Exam Gen: chronically ill appearing, in NAD Respiratory: Inspiratory crackles low to mid lung fields, diffuse wheeze, NWOB on 3L [place] CV: tachy, regular rhythm Skin: Erythematous macular rash primarily right butt cheek, some involvement of the cleft, minimal involvement left butt cheek in a contiguous fashion   Assessment & Plan:   Bronchiectasis with acute exacerbation: Recent growth of Pseudomonas which had grown in the past.  Occasional odd NTM grown in culture but smear negative. Most recent culture 08/2019 negative.  Ongoing sputum production.  Worried that sputum is just backed up, needs additional airway clearance --Continue HTS 3 times daily, albuterol BID, fluter valve BID -- Finish previously prescribed course of levofloxacin  Severe COPD: Related to cigarette use and bronchiectasis. Less wheezy today. --Continue stiolto for dual bronchodilation, tolerate perceived side effects of low back pain, consider nebulized LAMA/LABA in future ([facility] benefit) --Prednisone 20 mg daily, did not tolerate recent attempt to taper, consider retrial in the future --Encouraged pulmonary rehab  Chronic hypoxemic respiratory failure: 4 L at rest, 5 with exertion.  Related to above.  Worsening.  Order for oxygen concentrator at home placed.  Nasal congestion/chronic sinusitis: Ongoing nasal congestion, rhinorrhea, postnasal drip. --Continue antihistamine, azelastine, Flonase   Return in about 2 months (around  08/02/2021).   Lanier Clam, MD 06/09/2021   This appointment required 35 minutes of patient care (this includes precharting, chart review, review of results, face-to-face care, etc.).

## 2021-06-09 NOTE — Progress Notes (Signed)
A consult was received from an ED physician for cefepime per pharmacy dosing.  The patient's profile has been reviewed for ht/wt/allergies/indication/available labs.   A one time order has been placed for cefepime 2 g IV.  Further antibiotics/pharmacy consults should be ordered by admitting physician if indicated.                       Thank you, Suzzanne Cloud, PharmD, BCPS 06/09/2021  5:19 PM

## 2021-06-09 NOTE — ED Notes (Signed)
Patient updated on plan of care
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
## 2021-06-09 NOTE — Progress Notes (Signed)
PHARMACY NOTE -  [facility] has been assisting with dosing of Zosyn for pneumonia. Dosage remains stable at 3.375 g IV q8 hr and further renal adjustments per institutional Pharmacy antibiotic protocol  Pharmacy will sign off, following peripherally for culture results or dose adjustments. Please reconsult if a change in clinical status warrants re-evaluation of dosage.  Reuel Boom, PharmD, BCPS [phone] 06/09/2021, 6:40 PM

## 2021-06-09 NOTE — ED Provider Notes (Signed)
[facility] DEPT Provider Note   CSN: [id number] Arrival date & time: 06/09/21  1353     History  Chief Complaint  Patient presents with   Shortness of Breath    Jeffrey Hines is a 76 y.o. male.  The history is provided by the patient and medical records. No language interpreter was used.  Shortness of Breath  76 year old male brought here via EMS with complaints of shortness of breath.  Saw pulmonologist on Dec 22nd for sob and increase cough and sputum.  Levaquin was prescribed and recommend cont oxygen.  Sxs not improved and increase productive phlegm.  2nd dose of levaquin 750mg  but no improvement.  May not be compliant with his supplemental O2 at 5L.  Was going to see his pulmonologist today but was recommended to come to the ER for further assessment.    Home Medications Prior to Admission medications   Medication Sig Start Date End Date Taking? Authorizing Provider  acetaminophen (TYLENOL) 500 MG tablet 2 tabs    [provider]  albuterol (PROVENTIL) (2.5 MG/3ML) 0.083% nebulizer solution Take 3 mLs (2.5 mg total) by nebulization every 2 (two) hours as needed for wheezing or shortness of breath. 12/20/19   Nita Sells, MD  azelastine (ASTELIN) 0.1 % nasal spray Place 1 spray into the nose 2 (two) times daily. 09/02/20   [provider]  BL GLUCOSAMINE-CHONDROITIN PO Take by mouth. 08/07/15   [provider]  clonazePAM (KLONOPIN) 0.5 MG tablet Take 0.5 mg by mouth 2 (two) times daily.  01/27/16   [provider]  fluticasone (FLONASE) 50 MCG/ACT nasal spray Place 1 spray into both nostrils daily. 08/27/20   Hunsucker, [name], MD  furosemide (LASIX) 20 MG tablet Take 1 tablet (20 mg total) by mouth daily. Patient taking differently: Take 20 mg by mouth daily. 10mg  on Monday Wednesday Thursday and Saturdays 20mg  Tuesday Friday and Sunday 01/07/20   Martyn Ehrich, NP  guaiFENesin (MUCINEX) 600 MG  12 hr tablet Take 1 tablet (600 mg total) by mouth 2 (two) times daily. 12/20/19   Nita Sells, MD  levofloxacin (LEVAQUIN) 750 MG tablet Take 1 tablet (750 mg total) by mouth daily. 06/08/21   Hunsucker, [name], MD  loratadine (CLARITIN) 10 MG tablet Take 10 mg by mouth in the morning and at bedtime.     [provider]  OXYGEN 5 L/min See admin instructions.    [provider]  predniSONE (DELTASONE) 20 MG tablet Take 1 tablet (20 mg total) by mouth daily with breakfast. 02/12/21   Hunsucker, [name], MD  Probiotic Product (PROBIOTIC-10 PO) Take by mouth at bedtime.    [provider]  Respiratory Therapy Supplies (FLUTTER) DEVI 1 Device by Does not apply route as directed. 03/20/17   Javier Glazier, MD  rOPINIRole (REQUIP) 1 MG tablet Take 2 mg by mouth at bedtime.    [provider]  sodium chloride 0.9 % nebulizer solution Inhale into the lungs. 12/27/19   [provider]  sodium chloride HYPERTONIC 3 % nebulizer solution Take 4 mLs by nebulization 2 (two) times daily as needed for cough.  01/16/20   [provider]  tamsulosin (FLOMAX) 0.4 MG CAPS capsule 1 capsule    [provider]  Tiotropium Bromide-Olodaterol (STIOLTO RESPIMAT) 2.5-2.5 MCG/ACT AERS Inhale 2 puffs into the lungs daily.     [provider]  Turmeric 500 MG CAPS Take 1 capsule by mouth in the morning and  at bedtime.     [provider]      Allergies    Anoro ellipta [umeclidinium-vilanterol], Prolixin [fluphenazine], Spiriva respimat [tiotropium bromide monohydrate], and Advair diskus [fluticasone-salmeterol]    Review of Systems   Review of Systems  Respiratory:  Positive for shortness of breath.    Physical Exam Updated Vital Signs BP (!) 113/98 (BP Location: Right Arm)    Pulse (!) 115    Temp 98.7 F (37.1 C) (Oral)    Resp 18    Ht 5\' 11"  (1.803 m)    Wt 70.1 kg    SpO2 98%    BMI 21.56 kg/m  Physical Exam Vitals and  nursing note reviewed.  Constitutional:      General: He is not in acute distress.    Appearance: He is well-developed.     Comments: Chronically ill-appearing male in no acute discomfort  HENT:     Head: Atraumatic.  Eyes:     Conjunctiva/sclera: Conjunctivae normal.  Cardiovascular:     Rate and Rhythm: Tachycardia present.  Pulmonary:     Effort: Tachypnea present.     Breath sounds: Rhonchi (Occasional scattered rhonchi) present. No decreased breath sounds, wheezing or rales.  Chest:     Chest wall: No tenderness.  Musculoskeletal:     Cervical back: Neck supple.     Right lower leg: No edema.     Left lower leg: No edema.  Skin:    Capillary Refill: Capillary refill takes less than 2 seconds.     Findings: No rash.  Neurological:     Mental Status: He is alert.  Psychiatric:        Mood and Affect: Mood normal.    ED Results / Procedures / Treatments   Labs (all labs ordered are listed, but only abnormal results are displayed) Labs Reviewed  BASIC METABOLIC PANEL - Abnormal; Notable for the following components:      Result Value   Chloride 89 (*)    CO2 38 (*)    Glucose, Bld 112 (*)    BUN 24 (*)    All other components within normal limits  COMPREHENSIVE METABOLIC PANEL - Abnormal; Notable for the following components:   Chloride 87 (*)    CO2 41 (*)    Glucose, Bld 111 (*)    All other components within normal limits  URINALYSIS, ROUTINE W REFLEX MICROSCOPIC - Abnormal; Notable for the following components:   Ketones, ur 5 (*)    All other components within normal limits  RESP PANEL BY RT-PCR (FLU A&B, COVID) ARPGX2  CULTURE, BLOOD (ROUTINE X 2)  CULTURE, BLOOD (ROUTINE X 2)  LACTIC ACID, PLASMA  PROTIME-INR  CBC  LACTIC ACID, PLASMA    EKG EKG Interpretation  Date/Time:  Wednesday June 09 2021 15:11:17 EST Ventricular Rate:  109 PR Interval:  125 QRS Duration: 98 QT Interval:  346 QTC Calculation: 466 R Axis:   107 Text  Interpretation: Right and left arm electrode reversal, interpretation assumes no reversal (present on prior ECG) Sinus tachycardia Right atrial enlargement Abnormal lateral Q waves Anteroseptal infarct, age indeterminate No significant change since last tracing Confirmed by Dorie Rank [phone]) on 06/09/2021 5:09:15 PM  Radiology DG Chest 2 View  Result Date: 06/09/2021 CLINICAL DATA:  Shortness of breath. History of COPD and lung cancer. EXAM: CHEST - 2 VIEW COMPARISON:  Chest radiograph 04/24/2020 and CT 04/19/2021 FINDINGS: Postsurgical changes are again seen from left upper lobectomy with associated left hemithorax volume  loss and leftward shift of the mediastinal structures. The heart is obscured. Density in the left lung apex corresponds to a chronic, loculated pleural effusion on CT. Compared to the prior CT, there is worsening aeration of the left lung with extensive airspace opacity throughout the left mid and lower lung. Underlying emphysema is noted in the right lung with right basilar scarring. No pneumothorax is identified. No acute osseous abnormality is seen. IMPRESSION: Increased dense opacity in the left mid and lower lung which may reflect pneumonia Electronically Signed   By: Logan Bores M.D.   On: 06/09/2021 16:08    Procedures .Critical Care Performed by: Domenic Moras, PA-C Authorized by: Domenic Moras, PA-C   Critical care provider statement:    Critical care time (minutes):  32   Critical care was time spent personally by me on the following activities:  Development of treatment plan with patient or surrogate, discussions with consultants, evaluation of patient's response to treatment, examination of patient, ordering and review of laboratory studies, ordering and review of radiographic studies, ordering and performing treatments and interventions, pulse oximetry, re-evaluation of patient's condition and review of old charts    Medications Ordered in ED Medications  ceFEPIme  (MAXIPIME) 2 g in sodium chloride 0.9 % 100 mL IVPB (2 g Intravenous New Bag/Given 06/09/21 1731)  ipratropium-albuterol (DUONEB) 0.5-2.5 (3) MG/3ML nebulizer solution 3 mL (has no administration in time range)    ED Course/ Medical Decision Making/ A&P                           Medical Decision Making  BP 109/82    Pulse (!) 107    Temp 98.7 F (37.1 C) (Oral)    Resp (!) 22    Ht 5\' 11"  (1.803 m)    Wt 70.1 kg    SpO2 96%    BMI 21.56 kg/m   3:25 PM This is an elderly male significant history of lung cancer, COPD, tobacco abuse, anxiety, recurrent lung infection brought here via EMS from home with increased shortness of breath and increased oxygen demand.  With exertion at home, O2 sats drops down to the 70s.  I have reviewed patient's prior chart and consider it in my decision making process.  I have also reviewed and independently interpret labs and imaging that was obtained in the ED today.  Patient normally on supplemental oxygen at 4 L as needed which he has increased his use for the past 2 days due to worsening shortness of breath as well as increased hypoxia with exertion.  His pulmonologist is aware and have prescribed Levaquin to treat for suspected pneumonia.  History obtained through patient and through his wife at bedside.  He is a bit hard of hearing.  Patient has been antibiotic in which she is alternating between amoxicillin, and Levaquin, every 2 weeks.  He has been doing that for more than a year.  Recently he endorsed increased generalized fatigue, increased shortness of breath with exertion and increased productive cough.  He takes Tylenol around-the-clock and sometimes without Tylenol he would report feeling feverish.  He denies having nausea vomiting diarrhea.  Since patient is tachypneic, tachycardic, with increased oxygen demand, sepsis work-up initiated.  My plan on consult pulmonologist for further recommendation as well.  I have also reviewed pulmonologist's note from  prior visit and considered it in my plan of care.   5:03 PM Chest x-ray obtained demonstrate increased dense opacity in the  left mid and lower lung which may reflect pneumonia.  The remainder of his labs are reassuring.  I have reviewed and interpreted his chest x-ray independently as well as his EKG and labs.  I have reached out to consult his pulmonologist, Dr. Silas Flood who agrees with admission.  He recommend starting patient on cefepime as patient has had pseudomonal lung infection in the past.  We will consult medicine for admission.  5:51 PM Appreciate consultation from [facility] hospitalist, Dr. Roderic Palau who agrees to see and will admit patient for further care.  Patient is made aware of plan and agrees with plan.        Final Clinical Impression(s) / ED Diagnoses Final diagnoses:  Community acquired pneumonia of left lower lobe of lung  Acute on chronic respiratory failure with hypoxia [facility])    Rx / DC Orders ED Discharge Orders     None         Domenic Moras, PA-C 06/09/21 1754    Dorie Rank, MD 06/10/21 1015

## 2021-06-10 ENCOUNTER — Encounter (HOSPITAL_COMMUNITY): Payer: Self-pay | Admitting: Internal Medicine

## 2021-06-10 DIAGNOSIS — J449 Chronic obstructive pulmonary disease, unspecified: Secondary | ICD-10-CM | POA: Diagnosis not present

## 2021-06-10 DIAGNOSIS — L8990 Pressure ulcer of unspecified site, unspecified stage: Secondary | ICD-10-CM | POA: Insufficient documentation

## 2021-06-10 DIAGNOSIS — J9621 Acute and chronic respiratory failure with hypoxia: Secondary | ICD-10-CM | POA: Diagnosis not present

## 2021-06-10 DIAGNOSIS — J189 Pneumonia, unspecified organism: Secondary | ICD-10-CM | POA: Diagnosis not present

## 2021-06-10 LAB — CBC
HCT: 45.4 % (ref 39.0–52.0)
HCT: 45.9 % (ref 39.0–52.0)
Hemoglobin: 13.8 g/dL (ref 13.0–17.0)
Hemoglobin: 13.9 g/dL (ref 13.0–17.0)
MCH: 27.1 pg (ref 26.0–34.0)
MCH: 27.2 pg (ref 26.0–34.0)
MCHC: 30.3 g/dL (ref 30.0–36.0)
MCHC: 30.4 g/dL (ref 30.0–36.0)
MCV: 89.5 fL (ref 80.0–100.0)
MCV: 89.6 fL (ref 80.0–100.0)
Platelets: 251 10*3/uL (ref 150–400)
Platelets: 260 10*3/uL (ref 150–400)
RBC: 5.07 MIL/uL (ref 4.22–5.81)
RBC: 5.12 MIL/uL (ref 4.22–5.81)
RDW: 13.8 % (ref 11.5–15.5)
RDW: 14 % (ref 11.5–15.5)
WBC: 11.6 10*3/uL — ABNORMAL HIGH (ref 4.0–10.5)
WBC: 12.8 10*3/uL — ABNORMAL HIGH (ref 4.0–10.5)
nRBC: 0 % (ref 0.0–0.2)
nRBC: 0 % (ref 0.0–0.2)

## 2021-06-10 LAB — BLOOD GAS, ARTERIAL
Acid-Base Excess: 12.4 mmol/L — ABNORMAL HIGH (ref 0.0–2.0)
Allens test (pass/fail): POSITIVE — AB
Bicarbonate: 40.1 mmol/L — ABNORMAL HIGH (ref 20.0–28.0)
Drawn by: 560031
O2 Saturation: 97.5 %
Patient temperature: 98.6
pCO2 arterial: 68.1 mmHg (ref 32.0–48.0)
pH, Arterial: 7.388 (ref 7.350–7.450)
pO2, Arterial: 104 mmHg (ref 83.0–108.0)

## 2021-06-10 LAB — COMPREHENSIVE METABOLIC PANEL
ALT: 11 U/L (ref 0–44)
AST: 15 U/L (ref 15–41)
Albumin: 3.3 g/dL — ABNORMAL LOW (ref 3.5–5.0)
Alkaline Phosphatase: 55 U/L (ref 38–126)
Anion gap: 10 (ref 5–15)
BUN: 26 mg/dL — ABNORMAL HIGH (ref 8–23)
CO2: 36 mmol/L — ABNORMAL HIGH (ref 22–32)
Calcium: 9 mg/dL (ref 8.9–10.3)
Chloride: 92 mmol/L — ABNORMAL LOW (ref 98–111)
Creatinine, Ser: 0.8 mg/dL (ref 0.61–1.24)
GFR, Estimated: >60 mL/min (ref >60)
Glucose, Bld: 168 mg/dL — ABNORMAL HIGH (ref 70–99)
Potassium: 4.2 mmol/L (ref 3.5–5.1)
Sodium: 138 mmol/L (ref 135–145)
Total Bilirubin: 1 mg/dL (ref 0.3–1.2)
Total Protein: 7.1 g/dL (ref 6.5–8.1)

## 2021-06-10 LAB — EXPECTORATED SPUTUM ASSESSMENT W GRAM STAIN, RFLX TO RESP C

## 2021-06-10 LAB — PROCALCITONIN
Procalcitonin: <0.1 ng/mL
Procalcitonin: <0.1 ng/mL

## 2021-06-10 LAB — CREATININE, SERUM
Creatinine, Ser: 0.78 mg/dL (ref 0.61–1.24)
GFR, Estimated: >60 mL/min (ref >60)

## 2021-06-10 LAB — STREP PNEUMONIAE URINARY ANTIGEN: Strep Pneumo Urinary Antigen: NEGATIVE

## 2021-06-10 MED ORDER — SODIUM CHLORIDE 3 % IN NEBU
4.0000 mL | INHALATION_SOLUTION | Freq: Four times a day (QID) | RESPIRATORY_TRACT | Status: DC
  Administered 2021-06-10 – 2021-06-14 (×12): 4 mL via RESPIRATORY_TRACT
  Filled 2021-06-10 (×20): qty 4

## 2021-06-10 MED ORDER — APIXABAN 5 MG PO TABS
10.0000 mg | ORAL_TABLET | Freq: Two times a day (BID) | ORAL | Status: DC
  Administered 2021-06-10 – 2021-06-16 (×13): 10 mg via ORAL
  Filled 2021-06-10 (×13): qty 2

## 2021-06-10 MED ORDER — APIXABAN 5 MG PO TABS: 5.0000 mg | ORAL_TABLET | Freq: Two times a day (BID) | ORAL | Status: DC

## 2021-06-10 MED ORDER — LORATADINE 10 MG PO TABS
10.0000 mg | ORAL_TABLET | Freq: Every day | ORAL | Status: DC | PRN
  Administered 2021-06-10: 10 mg via ORAL
  Filled 2021-06-10: qty 1

## 2021-06-10 MED ORDER — BUDESONIDE 0.5 MG/2ML IN SUSP
0.5000 mg | Freq: Two times a day (BID) | RESPIRATORY_TRACT | Status: DC
  Administered 2021-06-10 – 2021-06-16 (×12): 0.5 mg via RESPIRATORY_TRACT
  Filled 2021-06-10 (×12): qty 2

## 2021-06-10 MED ORDER — FUROSEMIDE 20 MG PO TABS
20.0000 mg | ORAL_TABLET | ORAL | Status: DC
  Administered 2021-06-10 – 2021-06-12 (×2): 20 mg via ORAL
  Filled 2021-06-10 (×3): qty 1

## 2021-06-10 MED ORDER — FUROSEMIDE 40 MG PO TABS
40.0000 mg | ORAL_TABLET | ORAL | Status: DC
  Administered 2021-06-11: 40 mg via ORAL
  Filled 2021-06-10: qty 1

## 2021-06-10 NOTE — Progress Notes (Signed)
Pt instructed on use of Flutter Valve.  Pt very familiar with device and is comfortable using it.
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
## 2021-06-10 NOTE — ED Notes (Signed)
Sats staying between 84-88 % on 4 liters , pt placed on NRB temporary to bring ups sats

## 2021-06-10 NOTE — ED Notes (Signed)
Wife has returned to bedside

## 2021-06-10 NOTE — Progress Notes (Signed)
PROGRESS NOTE    Jeffrey Hines  WPY:[id number] DOB: 22-Aug-1945 DOA: 06/09/2021 PCP: [facility]    Brief Narrative:  76 year old male with a history of steroid-dependent/oxygen dependent COPD, bronchiectasis, previous history of lung cancer status post left upper lobe resection, presents with progressive shortness of breath.  Thought to be related to COPD/pneumonia/bronchiectasis.  CT cannot rule out pulmonary embolus.  Pulmonology following.  Currently on steroids, antibiotics, bronchodilators.  Also started on anticoagulation.   Assessment & Plan:   Principal Problem:   Pneumonia Active Problems:   COPD with chronic bronchitis (HCC)   Hypertension   Anxiety state   Chronic respiratory failure with hypoxia (HCC)   Pressure injury of skin   Acute on chronic respiratory failure with hypoxia -Secondary to pneumonia/COPD, possible element of PE -Seen by pulmonology, appreciate input -CT angiogram of the chest does not show any large central pulmonary embolus, but could not rule out RLL PE -He does show progressive consolidation of the left lower lobe -Currently appears to be comfortable on 6 L of oxygen while at rest, although his wife does note that he becomes significantly on hypoxic on minimal exertion -Continue supportive management   Pneumonia/bronchiectasis -Previous history of Pseudomonas isolated from sputum -Repeat sputum culture in process.  Pneumococcal antigen negative.  Legionella antigen in process -He has been started on Zosyn, and azithromycin -Discussed with pulmonology, with recommendation to continue Zosyn for total of 14 days  Acute pulmonary embolus -With ongoing tachycardia and hypoxia, patient was started on apixaban   COPD exacerbation -Chronically on prednisone 20 mg daily -He received a dose of IV steroids and is continued on prednisone 40 mg daily -Continue bronchodilators   Anxiety -Continue home dose of Klonopin   Restless leg  syndrome -Continue on Requip   Prior history of lung cancer, stage II, s/p resection of left upper lobe -CT scan of the chest done today shows interval increase in the size of the right middle lobe/perihilar nodule concerning for neoplastic process -We will defer further work-up to pulmonology.   Goals of care -Both myself and pulmonology have discussed goals of care with patient and his wife.  At this point, he wishes to continue full measures. -Ultimately, if he ends up on the ventilator, he would defer continuing these measures to his wife   DVT prophylaxis:  apixaban (ELIQUIS) tablet 10 mg  apixaban (ELIQUIS) tablet 5 mg  Code Status: Full code Family Communication: Discussed with wife at the bedside Disposition Plan: Status is: inpatient  Remains inpatient appropriate because: Continued management of respiratory failure due to pneumonia         Consultants:  Pulmonology  Procedures:    Antimicrobials:  Zosyn 1/4 > Azithromycin 1/4 >   Subjective: Continues to have cough, although less productive today.  He feels that his breathing is about the same as it was yesterday.  Objective: Vitals:   06/10/21 1500 06/10/21 1633 06/10/21 1707 06/10/21 1747  BP: 112/75  121/88   Pulse: (!) 112  (!) 110   Resp: (!) 24  (!) 22   Temp:   97.9 F (36.6 C)   TempSrc:      SpO2: 92% 91% 91% 93%  Weight:      Height:        Intake/Output Summary (Last 24 hours) at 06/10/2021 2022 Last data filed at 06/10/2021 1705 Gross per 24 hour  Intake 327.29 ml  Output 250 ml  Net 77.29 ml   Filed Weights   06/09/21  1405  Weight: 70.1 kg    Examination:  General exam: Appears calm and comfortable  Respiratory system: Diminished breath sounds more so on the left respiratory effort normal. Cardiovascular system: S1 & S2 heard, RRR. No JVD, murmurs, rubs, gallops or clicks. No pedal edema. Gastrointestinal system: Abdomen is nondistended, soft and nontender. No organomegaly or  masses felt. Normal bowel sounds heard. Central nervous system: Alert and oriented. No focal neurological deficits. Extremities: Symmetric 5 x 5 power. Skin: No rashes, lesions or ulcers Psychiatry: Judgement and insight appear normal. Mood & affect appropriate.     Data Reviewed: I have personally reviewed following labs and imaging studies  CBC: Recent Labs  Lab 06/09/21 1616 06/10/21 0000 06/10/21 0831  WBC 10.2 11.6* 12.8*  HGB 15.4 13.9 13.8  HCT 51.3 45.9 45.4  MCV 90.6 89.6 89.5  PLT 250 251 408   Basic Metabolic Panel: Recent Labs  Lab 06/09/21 1518 06/09/21 1616 06/10/21 0000 06/10/21 0831  NA 136 137  --  138  K 4.9 4.6  --  4.2  CL 89* 87*  --  92*  CO2 38* 41*  --  36*  GLUCOSE 112* 111*  --  168*  BUN 24* 23  --  26*  CREATININE 0.77 0.76 0.78 0.80  CALCIUM 9.1 9.3  --  9.0   GFR: Estimated Creatinine Clearance: 80.3 mL/min (by C-G formula based on SCr of 0.8 mg/dL). Liver Function Tests: Recent Labs  Lab 06/09/21 1616 06/10/21 0831  AST 17 15  ALT 13 11  ALKPHOS 61 55  BILITOT 0.9 1.0  PROT 7.9 7.1  ALBUMIN 3.6 3.3*   No results for input(s): LIPASE, AMYLASE in the last 168 hours. No results for input(s): AMMONIA in the last 168 hours. Coagulation Profile: Recent Labs  Lab 06/09/21 1616  INR 1.1   Cardiac Enzymes: No results for input(s): CKTOTAL, CKMB, CKMBINDEX, TROPONINI in the last 168 hours. BNP (last 3 results) No results for input(s): PROBNP in the last 8760 hours. HbA1C: No results for input(s): HGBA1C in the last 72 hours. CBG: No results for input(s): GLUCAP in the last 168 hours. Lipid Profile: No results for input(s): CHOL, HDL, LDLCALC, TRIG, CHOLHDL, LDLDIRECT in the last 72 hours. Thyroid Function Tests: No results for input(s): TSH, T4TOTAL, FREET4, T3FREE, THYROIDAB in the last 72 hours. Anemia Panel: No results for input(s): VITAMINB12, FOLATE, FERRITIN, TIBC, IRON, RETICCTPCT in the last 72 hours. Sepsis  Labs: Recent Labs  Lab 06/09/21 1616 06/09/21 1726 06/10/21 0000 06/10/21 0831  PROCALCITON  --   --  <0.10 <0.10  LATICACIDVEN 1.4 1.3  --   --     Recent Results (from the past 240 hour(s))  Resp Panel by RT-PCR (Flu A&B, Covid) Nasopharyngeal Swab     Status: None   Collection Time: 06/09/21  3:26 PM   Specimen: Nasopharyngeal Swab; Nasopharyngeal(NP) swabs in vial transport medium  Result Value Ref Range Status   SARS Coronavirus 2 by RT PCR NEGATIVE NEGATIVE Final    Comment: (NOTE) SARS-CoV-2 target nucleic acids are NOT DETECTED.  The SARS-CoV-2 RNA is generally detectable in upper respiratory specimens during the acute phase of infection. The lowest concentration of SARS-CoV-2 viral copies this assay can detect is 138 copies/mL. A negative result does not preclude SARS-Cov-2 infection and should not be used as the sole basis for treatment or other patient management decisions. A negative result may occur with  improper specimen collection/handling, submission of specimen other than nasopharyngeal swab, presence  of viral mutation(s) within the areas targeted by this assay, and inadequate number of viral copies(<138 copies/mL). A negative result must be combined with clinical observations, patient history, and epidemiological information. The expected result is Negative.  Fact Sheet for Patients:  EntrepreneurPulse.com.au  Fact Sheet for Healthcare Providers:  IncredibleEmployment.be  This test is no t yet approved or cleared by the [place] FDA and  has been authorized for detection and/or diagnosis of SARS-CoV-2 by FDA under an Emergency Use Authorization (EUA). This EUA will remain  in effect (meaning this test can be used) for the duration of the COVID-19 declaration under Section 564(b)(1) of the Act, 21 U.S.C.section 360bbb-3(b)(1), unless the authorization is terminated  or revoked sooner.       Influenza A by PCR  NEGATIVE NEGATIVE Final   Influenza B by PCR NEGATIVE NEGATIVE Final    Comment: (NOTE) The Xpert Xpress SARS-CoV-2/FLU/RSV plus assay is intended as an aid in the diagnosis of influenza from Nasopharyngeal swab specimens and should not be used as a sole basis for treatment. Nasal washings and aspirates are unacceptable for Xpert Xpress SARS-CoV-2/FLU/RSV testing.  Fact Sheet for Patients: EntrepreneurPulse.com.au  Fact Sheet for Healthcare Providers: IncredibleEmployment.be  This test is not yet approved or cleared by the [place] FDA and has been authorized for detection and/or diagnosis of SARS-CoV-2 by FDA under an Emergency Use Authorization (EUA). This EUA will remain in effect (meaning this test can be used) for the duration of the COVID-19 declaration under Section 564(b)(1) of the Act, 21 U.S.C. section 360bbb-3(b)(1), unless the authorization is terminated or revoked.  Performed at [facility], [place] [address], [place], [place] [address]   Culture, blood (Routine x 2)     Status: None (Preliminary result)   Collection Time: 06/09/21  4:16 PM   Specimen: BLOOD  Result Value Ref Range Status   Specimen Description   Final    BLOOD RIGHT ANTECUBITAL Performed at [facility] [address], [place], [place] [address]    Special Requests   Final    BOTTLES DRAWN AEROBIC AND ANAEROBIC Blood Culture results may not be optimal due to an excessive volume of blood received in culture bottles Performed at [facility] [address] [place], [place] [address]    Culture   Final    NO GROWTH < 12 HOURS Performed at [facility] [address], [place], [place] [address]    Report Status PENDING  Incomplete  Culture, blood (Routine x 2)     Status: None (Preliminary result)   Collection Time: 06/09/21  4:16 PM   Specimen: BLOOD  Result Value Ref Range Status    Specimen Description   Final    BLOOD BLOOD RIGHT FOREARM Performed at [facility] [address], [place], [place] [address]    Special Requests   Final    BOTTLES DRAWN AEROBIC AND ANAEROBIC Blood Culture results may not be optimal due to an excessive volume of blood received in culture bottles Performed at [facility] [address], [place], [place] [address]    Culture   Final    NO GROWTH < 12 HOURS Performed at [facility] [address] [place], [place] [address]    Report Status PENDING  Incomplete  Expectorated Sputum Assessment w Gram Stain, Rflx to Resp Cult     Status: None   Collection Time: 06/10/21  3:01 PM  Specimen: Sputum  Result Value Ref Range Status   Specimen Description SPUTUM  Final   Special Requests NONE  Final   Sputum evaluation   Final    THIS SPECIMEN IS ACCEPTABLE FOR SPUTUM CULTURE Performed at [facility], [place] [address] [place], [place] [address]    Report Status 06/10/2021 FINAL  Final         Radiology Studies: DG Chest 2 View  Result Date: 06/09/2021 CLINICAL DATA:  Shortness of breath. History of COPD and lung cancer. EXAM: CHEST - 2 VIEW COMPARISON:  Chest radiograph 04/24/2020 and CT 04/19/2021 FINDINGS: Postsurgical changes are again seen from left upper lobectomy with associated left hemithorax volume loss and leftward shift of the mediastinal structures. The heart is obscured. Density in the left lung apex corresponds to a chronic, loculated pleural effusion on CT. Compared to the prior CT, there is worsening aeration of the left lung with extensive airspace opacity throughout the left mid and lower lung. Underlying emphysema is noted in the right lung with right basilar scarring. No pneumothorax is identified. No acute osseous abnormality is seen. IMPRESSION: Increased dense opacity in the left mid and lower lung which may reflect pneumonia Electronically Signed   By:  Logan Bores M.D.   On: 06/09/2021 16:08   CT Angio Chest Pulmonary Embolism (PE) W or WO Contrast  Result Date: 06/09/2021 CLINICAL DATA:  Hypoxemia and shortness of breath. 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 EXAM: CT ANGIOGRAPHY CHEST WITH CONTRAST TECHNIQUE: Multidetector CT imaging of the chest was performed using the standard protocol during bolus administration of intravenous contrast. Multiplanar CT image reconstructions and MIPs were obtained to evaluate the vascular anatomy. CONTRAST:  91mL OMNIPAQUE IOHEXOL 350 MG/ML SOLN COMPARISON:  Chest CT dated 04/19/2021. FINDINGS: Evaluation of this exam is limited due to respiratory motion artifact. Cardiovascular: No cardiomegaly or pericardial effusion. Mild atherosclerotic calcification of the thoracic aorta. No aneurysmal dilatation or dissection. There is dilatation of the main pulmonary trunk suggestive of pulmonary hypertension. Evaluation of the pulmonary arteries is limited due to respiratory motion artifact. No large or central pulmonary artery embolus identified. Apparent filling defects in the distal subsegmental pulmonary artery branches in the right lower lobe (222-226/5) may be artifactual and related to respiratory motion. Small pulmonary artery emboli are not excluded. No CT evidence of right heart straining. Mediastinum/Nodes: No definite hilar or mediastinal adenopathy. The esophagus is grossly unremarkable. No mediastinal fluid collection. Lungs/Pleura: Postsurgical changes of left upper lobectomy. Similar appearance of a chronic and loculated pleural effusion at the left lung apex. There is interval complete consolidation of the left lower lobe with air bronchogram, new since the prior CT. Apparent debris or mucous plugging noted in the left lower lobe bronchus (128/5) and findings likely represent postobstructive atelectasis or pneumonia. There is background of severe emphysema. There is a  1 cm nodule with irregular or somewhat margin in the right middle lobe/perihilar region (78/10) has increased in size since 04/17/2020 and concerning for a neoplastic process. Multidisciplinary consult is advised. Similar appearance of a 1 cm subpleural nodule in the superior segment of the right lower lobe medially, possibly scarring. Compensatory hyperinflation of the right lung with chronic post inflammatory changes at the right lung base. No pneumothorax. Upper Abdomen: No acute abnormality. Musculoskeletal: Degenerative changes of the spine. Postsurgical changes of the posterior left ribs. No acute osseous pathology. Review of the MIP images confirms the above findings. IMPRESSION: 1. Limited study  due to respiratory motion artifact. No large or central pulmonary artery embolus identified. Apparent filling defects in the distal subsegmental pulmonary artery branches in the right lower lobe may be artifactual and related to respiratory motion artifact. Small pulmonary artery emboli are not excluded. 2. Interval complete consolidation of the left lower lobe with air bronchogram, new since the prior CT. Apparent debris or mucous plugging in the left lower lobe bronchus. Findings likely represent postobstructive atelectasis or pneumonia. 3. Interval increase in size of the right middle lobe/perihilar nodule concerning for a neoplastic process. Multidisciplinary consult is advised. 4. Postsurgical changes of left upper lobectomy. Similar appearance of a chronic and loculated pleural effusion at the left lung apex. 5. Aortic Atherosclerosis (ICD10-I70.0) and Emphysema (ICD10-J43.9). Electronically Signed   By: Anner Crete M.D.   On: 06/09/2021 19:32        Scheduled Meds:  apixaban  10 mg Oral BID   Followed by   Derrill Memo ON 06/17/2021] apixaban  5 mg Oral BID   budesonide (PULMICORT) nebulizer solution  0.5 mg Nebulization Q12H   clonazePAM  0.5 mg Oral BID   furosemide  20 mg Oral Once per day on Mon  Wed Thu Sat   And   [START ON 06/11/2021] furosemide  40 mg Oral Once per day on Sun Tue Fri   guaiFENesin  600 mg Oral BID   ipratropium-albuterol  3 mL Nebulization Q6H   predniSONE  40 mg Oral QPC supper   rOPINIRole  2 mg Oral QHS   sodium chloride HYPERTONIC  4 mL Nebulization QID   tamsulosin  0.4 mg Oral Daily   Continuous Infusions:  azithromycin Stopped (06/10/21 0050)   piperacillin-tazobactam (ZOSYN)  IV 3.375 g (06/10/21 1454)     LOS: 1 day    Time spent: 95mins    Kathie Dike, MD [facility]   If 7PM-7AM, please contact night-coverage [email or web address]  06/10/2021, 8:22 PM

## 2021-06-10 NOTE — Progress Notes (Signed)
°   06/10/21 1707  Assess: MEWS Score  Temp 97.9 F (36.6 C)  BP 121/88  Pulse Rate (!) 110  Resp (!) 22  Level of Consciousness Alert  SpO2 91 %  O2 Device Nasal Cannula  O2 Flow Rate (L/min) 6 L/min  Assess: MEWS Score  MEWS Temp 0  MEWS Systolic 0  MEWS Pulse 1  MEWS RR 1  MEWS LOC 0  MEWS Score 2  MEWS Score Color Yellow  Assess: if the MEWS score is Yellow or Red  Were vital signs taken at a resting state? Yes  Focused Assessment No change from prior assessment  Does the patient meet 2 or more of the SIRS criteria? Yes  Does the patient have a confirmed or suspected source of infection? No  MEWS guidelines implemented *See Row Information* Yes  Treat  Pain Scale 0-10  Pain Score 0  Assess: SIRS CRITERIA  SIRS Temperature  0  SIRS Pulse 1  SIRS Respirations  1  SIRS WBC 1  SIRS Score Sum  3   MEWS yellow protocol initiated.  Layla Maw, RN

## 2021-06-11 DIAGNOSIS — J9611 Chronic respiratory failure with hypoxia: Secondary | ICD-10-CM

## 2021-06-11 DIAGNOSIS — J189 Pneumonia, unspecified organism: Secondary | ICD-10-CM | POA: Diagnosis not present

## 2021-06-11 DIAGNOSIS — J449 Chronic obstructive pulmonary disease, unspecified: Secondary | ICD-10-CM | POA: Diagnosis not present

## 2021-06-11 DIAGNOSIS — J9621 Acute and chronic respiratory failure with hypoxia: Secondary | ICD-10-CM | POA: Diagnosis not present

## 2021-06-11 LAB — BASIC METABOLIC PANEL
Anion gap: 9 (ref 5–15)
BUN: 23 mg/dL (ref 8–23)
CO2: 41 mmol/L — ABNORMAL HIGH (ref 22–32)
Calcium: 8.7 mg/dL — ABNORMAL LOW (ref 8.9–10.3)
Chloride: 92 mmol/L — ABNORMAL LOW (ref 98–111)
Creatinine, Ser: 0.66 mg/dL (ref 0.61–1.24)
GFR, Estimated: >60 mL/min (ref >60)
Glucose, Bld: 169 mg/dL — ABNORMAL HIGH (ref 70–99)
Potassium: 4.9 mmol/L (ref 3.5–5.1)
Sodium: 142 mmol/L (ref 135–145)

## 2021-06-11 LAB — CBC
HCT: 45.6 % (ref 39.0–52.0)
Hemoglobin: 13.4 g/dL (ref 13.0–17.0)
MCH: 26.9 pg (ref 26.0–34.0)
MCHC: 29.4 g/dL — ABNORMAL LOW (ref 30.0–36.0)
MCV: 91.6 fL (ref 80.0–100.0)
Platelets: 229 10*3/uL (ref 150–400)
RBC: 4.98 MIL/uL (ref 4.22–5.81)
RDW: 14 % (ref 11.5–15.5)
WBC: 14.5 10*3/uL — ABNORMAL HIGH (ref 4.0–10.5)
nRBC: 0 % (ref 0.0–0.2)

## 2021-06-11 LAB — BLOOD GAS, ARTERIAL
Acid-Base Excess: 13.6 mmol/L — ABNORMAL HIGH (ref 0.0–2.0)
Bicarbonate: 43 mmol/L — ABNORMAL HIGH (ref 20.0–28.0)
O2 Saturation: 93.4 %
Patient temperature: 98.6
pCO2 arterial: 82.2 mmHg (ref 32.0–48.0)
pH, Arterial: 7.338 — ABNORMAL LOW (ref 7.350–7.450)
pO2, Arterial: 71.4 mmHg — ABNORMAL LOW (ref 83.0–108.0)

## 2021-06-11 MED ORDER — SODIUM CHLORIDE 0.9 % IV BOLUS
250.0000 mL | Freq: Once | INTRAVENOUS | Status: AC
  Administered 2021-06-11: 250 mL via INTRAVENOUS

## 2021-06-11 MED ORDER — CLONAZEPAM 0.5 MG PO TABS
0.5000 mg | ORAL_TABLET | Freq: Two times a day (BID) | ORAL | Status: DC | PRN
  Administered 2021-06-13 – 2021-06-14 (×3): 0.5 mg via ORAL
  Filled 2021-06-11 (×3): qty 1

## 2021-06-11 NOTE — Progress Notes (Signed)
PROGRESS NOTE    Jeffrey Hines  UDJ:[id number] DOB: 08/21/1945 DOA: 06/09/2021 PCP: [facility]    Brief Narrative:  76 year old male with a history of steroid-dependent/oxygen dependent COPD, bronchiectasis, previous history of lung cancer status post left upper lobe resection, presents with progressive shortness of breath.  Thought to be related to COPD/pneumonia/bronchiectasis.  CT cannot rule out pulmonary embolus.  Pulmonology following.  Currently on steroids, antibiotics, bronchodilators.  Also started on anticoagulation.   Assessment & Plan:   Principal Problem:   Pneumonia Active Problems:   COPD with chronic bronchitis (HCC)   Hypertension   Anxiety state   Chronic respiratory failure with hypoxia (HCC)   Pressure injury of skin   Acute on chronic respiratory failure with hypoxia and hypercapnia -Secondary to pneumonia/COPD, possible element of PE -Seen by pulmonology, appreciate input -CT angiogram of the chest does not show any large central pulmonary embolus, but could not rule out RLL PE -He does show progressive consolidation of the left lower lobe -Currently appears to be comfortable on 6 L of oxygen while at rest, although his wife does note that he becomes significantly on hypoxic on minimal exertion -noted to have rising pCO2 and started on bipap  Metabolic encephalopathy -secondary to hypercapnia -improving with bipap   Pneumonia/bronchiectasis -Previous history of Pseudomonas isolated from sputum -Repeat sputum culture in process.  Pneumococcal antigen negative.  Legionella antigen in process -He has been started on Zosyn, and azithromycin -Discussed with pulmonology, with recommendation to continue Zosyn for total of 14 days  Acute pulmonary embolus -With ongoing tachycardia and hypoxia, patient was started on apixaban   COPD exacerbation -Chronically on prednisone 20 mg daily -He received a dose of IV steroids and is continued on  prednisone 40 mg daily -Continue bronchodilators   Anxiety -Continue home dose of Klonopin, but change to prn   Restless leg syndrome -Continue on Requip   Prior history of lung cancer, stage II, s/p resection of left upper lobe -CT scan of the chest done today shows interval increase in the size of the right middle lobe/perihilar nodule concerning for neoplastic process -We will defer further work-up to pulmonology.   Goals of care -Both myself and pulmonology have discussed goals of care with patient and his wife.  At this point, he wishes to continue full measures. -Ultimately, if he ends up on the ventilator, he would defer continuing these measures to his wife   DVT prophylaxis:  apixaban (ELIQUIS) tablet 10 mg  apixaban (ELIQUIS) tablet 5 mg  Code Status: Full code Family Communication: Discussed with wife at the bedside Disposition Plan: Status is: inpatient  Remains inpatient appropriate because: Continued management of respiratory failure due to pneumonia         Consultants:  Pulmonology  Procedures:    Antimicrobials:  Zosyn 1/4 > Azithromycin 1/4 >   Subjective: Noted to be more sleepy and confused this morning. Blood gas checked and confirmed that pCO2 as rising. He was placed on bipap  Objective: Vitals:   06/11/21 1823 06/11/21 2006 06/11/21 2013 06/11/21 2044  BP: 102/62   104/67  Pulse: (!) 126   (!) 112  Resp: 18   20  Temp: 97.9 F (36.6 C)   98.3 F (36.8 C)  TempSrc: Oral   Oral  SpO2: (!) 88% 96% 98% 91%  Weight:      Height:        Intake/Output Summary (Last 24 hours) at 06/11/2021 2231 Last data filed at 06/11/2021  1822 Gross per 24 hour  Intake 791.58 ml  Output 1450 ml  Net -658.42 ml   Filed Weights   06/09/21 1405  Weight: 70.1 kg    Examination:  General exam: Appears calm and comfortable  Respiratory system: Diminished breath sounds more so on the left respiratory effort normal. Cardiovascular system: S1 & S2  heard, RRR. No JVD, murmurs, rubs, gallops or clicks. No pedal edema. Gastrointestinal system: Abdomen is nondistended, soft and nontender. No organomegaly or masses felt. Normal bowel sounds heard. Central nervous system: No focal neurological deficits. Extremities: Symmetric 5 x 5 power. Skin: No rashes, lesions or ulcers Psychiatry: somnolent and confused    Data Reviewed: I have personally reviewed following labs and imaging studies  CBC: Recent Labs  Lab 06/09/21 1616 06/10/21 0000 06/10/21 0831 06/11/21 0352  WBC 10.2 11.6* 12.8* 14.5*  HGB 15.4 13.9 13.8 13.4  HCT 51.3 45.9 45.4 45.6  MCV 90.6 89.6 89.5 91.6  PLT 250 251 260 604   Basic Metabolic Panel: Recent Labs  Lab 06/09/21 1518 06/09/21 1616 06/10/21 0000 06/10/21 0831 06/11/21 0352  NA 136 137  --  138 142  K 4.9 4.6  --  4.2 4.9  CL 89* 87*  --  92* 92*  CO2 38* 41*  --  36* 41*  GLUCOSE 112* 111*  --  168* 169*  BUN 24* 23  --  26* 23  CREATININE 0.77 0.76 0.78 0.80 0.66  CALCIUM 9.1 9.3  --  9.0 8.7*   GFR: Estimated Creatinine Clearance: 80.3 mL/min (by C-G formula based on SCr of 0.66 mg/dL). Liver Function Tests: Recent Labs  Lab 06/09/21 1616 06/10/21 0831  AST 17 15  ALT 13 11  ALKPHOS 61 55  BILITOT 0.9 1.0  PROT 7.9 7.1  ALBUMIN 3.6 3.3*   No results for input(s): LIPASE, AMYLASE in the last 168 hours. No results for input(s): AMMONIA in the last 168 hours. Coagulation Profile: Recent Labs  Lab 06/09/21 1616  INR 1.1   Cardiac Enzymes: No results for input(s): CKTOTAL, CKMB, CKMBINDEX, TROPONINI in the last 168 hours. BNP (last 3 results) No results for input(s): PROBNP in the last 8760 hours. HbA1C: No results for input(s): HGBA1C in the last 72 hours. CBG: No results for input(s): GLUCAP in the last 168 hours. Lipid Profile: No results for input(s): CHOL, HDL, LDLCALC, TRIG, CHOLHDL, LDLDIRECT in the last 72 hours. Thyroid Function Tests: No results for input(s): TSH,  T4TOTAL, FREET4, T3FREE, THYROIDAB in the last 72 hours. Anemia Panel: No results for input(s): VITAMINB12, FOLATE, FERRITIN, TIBC, IRON, RETICCTPCT in the last 72 hours. Sepsis Labs: Recent Labs  Lab 06/09/21 1616 06/09/21 1726 06/10/21 0000 06/10/21 0831  PROCALCITON  --   --  <0.10 <0.10  LATICACIDVEN 1.4 1.3  --   --     Recent Results (from the past 240 hour(s))  Resp Panel by RT-PCR (Flu A&B, Covid) Nasopharyngeal Swab     Status: None   Collection Time: 06/09/21  3:26 PM   Specimen: Nasopharyngeal Swab; Nasopharyngeal(NP) swabs in vial transport medium  Result Value Ref Range Status   SARS Coronavirus 2 by RT PCR NEGATIVE NEGATIVE Final    Comment: (NOTE) SARS-CoV-2 target nucleic acids are NOT DETECTED.  The SARS-CoV-2 RNA is generally detectable in upper respiratory specimens during the acute phase of infection. The lowest concentration of SARS-CoV-2 viral copies this assay can detect is 138 copies/mL. A negative result does not preclude SARS-Cov-2 infection and should not  be used as the sole basis for treatment or other patient management decisions. A negative result may occur with  improper specimen collection/handling, submission of specimen other than nasopharyngeal swab, presence of viral mutation(s) within the areas targeted by this assay, and inadequate number of viral copies(<138 copies/mL). A negative result must be combined with clinical observations, patient history, and epidemiological information. The expected result is Negative.  Fact Sheet for Patients:  EntrepreneurPulse.com.au  Fact Sheet for Healthcare Providers:  IncredibleEmployment.be  This test is no t yet approved or cleared by the [place] FDA and  has been authorized for detection and/or diagnosis of SARS-CoV-2 by FDA under an Emergency Use Authorization (EUA). This EUA will remain  in effect (meaning this test can be used) for the duration of  the COVID-19 declaration under Section 564(b)(1) of the Act, 21 U.S.C.section 360bbb-3(b)(1), unless the authorization is terminated  or revoked sooner.       Influenza A by PCR NEGATIVE NEGATIVE Final   Influenza B by PCR NEGATIVE NEGATIVE Final    Comment: (NOTE) The Xpert Xpress SARS-CoV-2/FLU/RSV plus assay is intended as an aid in the diagnosis of influenza from Nasopharyngeal swab specimens and should not be used as a sole basis for treatment. Nasal washings and aspirates are unacceptable for Xpert Xpress SARS-CoV-2/FLU/RSV testing.  Fact Sheet for Patients: EntrepreneurPulse.com.au  Fact Sheet for Healthcare Providers: IncredibleEmployment.be  This test is not yet approved or cleared by the [place] FDA and has been authorized for detection and/or diagnosis of SARS-CoV-2 by FDA under an Emergency Use Authorization (EUA). This EUA will remain in effect (meaning this test can be used) for the duration of the COVID-19 declaration under Section 564(b)(1) of the Act, 21 U.S.C. section 360bbb-3(b)(1), unless the authorization is terminated or revoked.  Performed at [facility], [place] [address] [place], [place] [address]   Culture, blood (Routine x 2)     Status: None (Preliminary result)   Collection Time: 06/09/21  4:16 PM   Specimen: BLOOD  Result Value Ref Range Status   Specimen Description   Final    BLOOD RIGHT ANTECUBITAL Performed at [facility] [address] [place], [place] [address]    Special Requests   Final    BOTTLES DRAWN AEROBIC AND ANAEROBIC Blood Culture results may not be optimal due to an excessive volume of blood received in culture bottles Performed at [facility] [address], [place], [place] [address]    Culture   Final    NO GROWTH 2 DAYS Performed at [facility] [address], [place], [place] [address]    Report Status  PENDING  Incomplete  Culture, blood (Routine x 2)     Status: None (Preliminary result)   Collection Time: 06/09/21  4:16 PM   Specimen: BLOOD  Result Value Ref Range Status   Specimen Description   Final    BLOOD BLOOD RIGHT FOREARM Performed at [facility] [address], [place], [place] [address]    Special Requests   Final    BOTTLES DRAWN AEROBIC AND ANAEROBIC Blood Culture results may not be optimal due to an excessive volume of blood received in culture bottles Performed at [facility] [address] [place], [place] [address]    Culture   Final    NO GROWTH 2 DAYS Performed at [facility] [address], [place],  [address]    Report Status PENDING  Incomplete  Expectorated Sputum Assessment w Gram Stain, Rflx to Resp Cult     Status: None   Collection Time: 06/10/21  3:01 PM   Specimen: Sputum  Result Value Ref Range Status   Specimen Description SPUTUM  Final   Special Requests NONE  Final   Sputum evaluation   Final    THIS SPECIMEN IS ACCEPTABLE FOR SPUTUM CULTURE Performed at [facility], [place] [address], [place], [place] [address]    Report Status 06/10/2021 FINAL  Final  Culture, Respiratory w Gram Stain     Status: None (Preliminary result)   Collection Time: 06/10/21  3:01 PM   Specimen: SPU  Result Value Ref Range Status   Specimen Description   Final    SPUTUM Performed at [facility] [address], [place], [place] [address]    Special Requests   Final    NONE Reflexed from [id number] Performed at [facility], [place] [address], [place], [place] [address]    Gram Stain   Final    MODERATE WBC PRESENT,BOTH PMN AND MONONUCLEAR FEW GRAM POSITIVE COCCI IN PAIRS RARE BUDDING YEAST SEEN    Culture   Final    NO GROWTH < 12 HOURS Performed at [facility], [place] [address] [place], [place] [address]    Report Status PENDING  Incomplete          Radiology Studies: No results found.      Scheduled Meds:  apixaban  10 mg Oral BID   Followed by   Derrill Memo ON 06/17/2021] apixaban  5 mg Oral BID   budesonide (PULMICORT) nebulizer solution  0.5 mg Nebulization Q12H   furosemide  20 mg Oral Once per day on Mon Wed Thu Sat   And   furosemide  40 mg Oral Once per day on Sun Tue Fri   guaiFENesin  600 mg Oral BID   ipratropium-albuterol  3 mL Nebulization Q6H   predniSONE  40 mg Oral QPC supper   rOPINIRole  2 mg Oral QHS   sodium chloride HYPERTONIC  4 mL Nebulization QID   tamsulosin  0.4 mg Oral Daily   Continuous Infusions:  azithromycin 500 mg (06/11/21 0258)   piperacillin-tazobactam (ZOSYN)  IV 3.375 g (06/11/21 1216)     LOS: 2 days    Time spent: 76mins    Kathie Dike, MD [facility]   If 7PM-7AM, please contact night-coverage [email or web address]  06/11/2021, 10:31 PM

## 2021-06-11 NOTE — Progress Notes (Signed)
Patient placed on Bipap by RT. Patient is currently tolerating well. Will continue to monitor.  Layla Maw, RN

## 2021-06-11 NOTE — Progress Notes (Signed)
Patient remained on Bipap from 1200 pm- 1700. O2 is now 92% on 5 L. HR currently between 100s and 120s. Patient advised to use Bipap at bedtime and patient agreed to treatment as tolerated. Will continue to monitor.  Layla Maw, RN

## 2021-06-11 NOTE — Progress Notes (Signed)
°   06/11/21 0955  Provider Notification  Provider Name/Title Dr. Roderic Palau, MD  Date Provider Notified 06/11/21  Time Provider Notified [phone]  Notification Type  (Secure chat)  Notification Reason Other (Comment) (Patient lethargy)  Provider response See new orders (Repeat arterial blood gas)  Date of Provider Response 06/11/21  Time of Provider Response 0955   MD notified of patient lethargy. MD ordered repeat arterial blood gas. Advised to hold Klonopin. Will continue to monitor.  Layla Maw, RN

## 2021-06-11 NOTE — Progress Notes (Signed)
°   06/11/21 0927  Assess: MEWS Score  Temp 97.7 F (36.5 C)  BP (!) 139/91  Pulse Rate (!) 105  Resp (!) 24  SpO2 98 %  Assess: MEWS Score  MEWS Temp 0  MEWS Systolic 0  MEWS Pulse 1  MEWS RR 1  MEWS LOC 0  MEWS Score 2  MEWS Score Color Yellow  Assess: if the MEWS score is Yellow or Red  Were vital signs taken at a resting state? Yes  Focused Assessment No change from prior assessment  Does the patient meet 2 or more of the SIRS criteria? Yes  Does the patient have a confirmed or suspected source of infection? No  MEWS guidelines implemented *See Row Information* No, previously yellow, continue vital signs every 4 hours  Assess: SIRS CRITERIA  SIRS Temperature  0  SIRS Pulse 1  SIRS Respirations  1  SIRS WBC 1  SIRS Score Sum  3   Previously MEWS score yellow. Will maintain patient as Q4h vital signs. Will continue to monitor.  Layla Maw, RN

## 2021-06-11 NOTE — TOC Initial Note (Signed)
Transition of Care [facility]) - Initial/Assessment Note    Patient Details  Name: Jeffrey Hines MRN: [id number] Date of Birth: 12/22/1945  Transition of Care [facility]) CM/SW Contact:    Leeroy Cha, RN Phone Number: 06/11/2021, 7:50 AM  Clinical Narrative:                  Transition of Care [facility]) Screening Note   Patient Details  Name: Jeffrey Hines Date of Birth: 06-01-1946   Transition of Care [facility]) CM/SW Contact:    Leeroy Cha, RN Phone Number: 06/11/2021, 7:50 AM    Transition of Care Department [facility]) has reviewed patient and no TOC needs have been identified at this time. We will continue to monitor patient advancement through interdisciplinary progression rounds. If new patient transition needs arise, please place a TOC consult.    Expected Discharge Plan: Home/Self Care Barriers to Discharge: Continued Medical Work up   Patient Goals and CMS Choice Patient states their goals for this hospitalization and ongoing recovery are:: to go back home and feel better CMS Medicare.gov Compare Post Acute Care list provided to:: Patient    Expected Discharge Plan and Services Expected Discharge Plan: Home/Self Care   Discharge Planning Services: CM Consult   Living arrangements for the past 2 months: Single Family Home                                      Prior Living Arrangements/Services Living arrangements for the past 2 months: Single Family Home Lives with:: Spouse Patient language and need for interpreter reviewed:: Yes Do you feel safe going back to the place where you live?: Yes            Criminal Activity/Legal Involvement Pertinent to Current Situation/Hospitalization: No - Comment as needed  Activities of Daily Living Home Assistive Devices/Equipment: Eyeglasses, Dentures (specify type), Oxygen, Shower chair with back (full set dentures, pulse oximeter) ADL Screening (condition at time of admission) Patient's cognitive  ability adequate to safely complete daily activities?: Yes Is the patient deaf or have difficulty hearing?: Yes Does the patient have difficulty seeing, even when wearing glasses/contacts?: No Does the patient have difficulty concentrating, remembering, or making decisions?: Yes (slight) Patient able to express need for assistance with ADLs?: Yes Does the patient have difficulty dressing or bathing?: Yes Independently performs ADLs?: No Communication: Independent Dressing (OT): Needs assistance Is this a change from baseline?: Change from baseline, expected to last >3 days Grooming: Independent Feeding: Independent Bathing: Needs assistance Is this a change from baseline?: Change from baseline, expected to last >3 days Toileting: Needs assistance Is this a change from baseline?: Pre-admission baseline In/Out Bed: Needs assistance Is this a change from baseline?: Pre-admission baseline Walks in Home: Needs assistance Is this a change from baseline?: Pre-admission baseline Does the patient have difficulty walking or climbing stairs?: Yes Weakness of Legs: Both Weakness of Arms/Hands: Both  Permission Sought/Granted                  Emotional Assessment Appearance:: Appears stated age Attitude/Demeanor/Rapport: Engaged Affect (typically observed): Calm Orientation: : Oriented to Place, Oriented to Self, Oriented to  Time, Oriented to Situation Alcohol / Substance Use: Not Applicable Psych Involvement: No (comment)  Admission diagnosis:  Pneumonia [J18.9] Acute on chronic respiratory failure with hypoxia ([place]) [J96.21] Community acquired pneumonia of left lower lobe of lung [J18.9] Patient Active Problem List  Diagnosis Date Noted   Pressure injury of skin 06/10/2021   Healthcare maintenance 05/01/2020   CAP (community acquired pneumonia) 04/23/2020   Pneumonia 04/23/2020   Bronchiectasis without complication ([place]) 25/00/3704   Bronchiectasis with (acute) exacerbation  ([place]) 01/07/2020   Leg swelling 01/07/2020   Headache 01/07/2020   Lower GI bleed 12/16/2019   Hypoxia 10/07/2018   Community acquired pneumonia of left lung    Sepsis ([place])    Postobstructive pneumonia    Pneumonia due to Pseudomonas species ([place])    Restless leg syndrome 09/07/2018   Cough 03/20/2017   Lung cancer ([place]) 12/15/2014   Chronic respiratory failure with hypoxia ([place]) 08/19/2014   Malnutrition of moderate degree ([place]) 08/08/2014   Post-obstruction pneumonia due to Pseudomonas aeruginosa 08/07/2014   Squamous cell carcinoma of lung, stage II ([place])    Anxiety state    Lung mass    COPD with chronic bronchitis ([place])    Tobacco abuse    Hypertension    PCP:  Clinic, [place]:   [id number] - [place], [place] [address] [place] [address] Phone: [phone] Fax: [phone]     Social Determinants of Health (SDOH) Interventions    Readmission Risk Interventions Readmission Risk Prevention Plan 10/10/2018  Post Dischage Appt Complete  Medication Screening Complete  Transportation Screening Complete  Some recent data might be hidden

## 2021-06-11 NOTE — Progress Notes (Signed)
°   06/11/21 1055  Provider Notification  Provider Name/Title Dr. Roderic Palau, MD  Date Provider Notified 06/11/21  Time Provider Notified 1055  Notification Type Page (Secure chat)  Notification Reason Critical result  Test performed and critical result PCO2 82.2  Date Critical Result Received 06/11/21  Time Critical Result Received 1055  Provider response See new orders (Bipap)  Date of Provider Response 06/11/21  Time of Provider Response 66   MD notified of critical result PCO2 of 82.2. MD placed new orders for Bipap. RT notified and came to bedside to apply. When RT arrived, they saw that the patient was finishing eating breakfast. RT stated that to prevent aspiration, they will return in 30-45 minutes to apply Bipap. Will continue to monitor.  Layla Maw, RN

## 2021-06-11 NOTE — Progress Notes (Signed)
NAME:  Jeffrey Hines, MRN:  [id number], DOB:  1946/04/08, LOS: 2 ADMISSION DATE:  06/09/2021, CONSULTATION DATE:  1/6 REFERRING MD:  Haywood Pao, CHIEF COMPLAINT:  Dyspnea   History of Present Illness:  76 y/o male with a complex pulmonary history presented with acute on chronic respiratory failure with hypercarbia due to a bronchiectasis exacerbation and PE.  PCCM consulted for the same.  Follows with Dr. Silas Flood in clinic.  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 Initial imaging: some increased density of Left hemithorax (although chronically has almost complete opacification of left hemithorax). RVP negative, NML WBC. + low grade fever. Admitted w/ working dx of Acute on chronic resp failure 2/2 AECOPD vs bronchiectasis flare vs PNA. Pulm asked to admit. Started zosyn, HT saline nebs, Oxygen, pulse steroids, and ordered CT angio 1/5 possible RLL filling defect, will start on apixaban, L sided Pna present 1/5 sputum culture >>>  Interim History / Subjective:  This morning progressively sleepy, abg showed hypercarbia, started on BIPAP  Objective   Blood pressure 110/78, pulse 100, temperature 98.1 F (36.7 C), temperature source Oral, resp. rate 20, height 5\' 11"  (1.803 m), weight 70.1 kg, SpO2 98 %.    FiO2 (%):  [35 %-40 %] 35 %   Intake/Output Summary (Last 24 hours) at 06/11/2021 1319 Last data filed at 06/11/2021 1150 Gross per 24 hour  Intake 387.29 ml  Output 1000 ml  Net -612.71 ml   Filed Weights   06/09/21 1405  Weight: 70.1 kg    Examination:  General:  Chronically ill appearing, resting  comfortably in bed HENT: NCAT OP clear BIPAP mask in place PULM: diminished on left, clear on right, normal effort CV: RRR, no mgr GI: BS+, soft, nontender MSK: normal bulk and tone Neuro: awake, alert, no distress, MAEW    Resolved Hospital Problem list     Assessment & Plan:  Bronchiectasis with acute exacerbation History of pseudomonas infection 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  [name] has said recently that he would want life support. This is a shock to his wife and I.  I've cared for Tim for years and he has never struck me as someone who would do well with life support or even want it.  She feels the same way.  Regardless, we decided based on his recent wishes that the best approach in the event of respiratory failure requiring invasive mechanical ventilation would be for Jeffrey Hines to carry that out for 48 hours.  If no improvement then palliative extubation.   Best Practice (right click and "Reselect all SmartList Selections" daily)   Per [facility]  Labs   CBC: Recent Labs  Lab 06/09/21 1616 06/10/21 0000 06/10/21 0831 06/11/21  0352  WBC 10.2 11.6* 12.8* 14.5*  HGB 15.4 13.9 13.8 13.4  HCT 51.3 45.9 45.4 45.6  MCV 90.6 89.6 89.5 91.6  PLT 250 251 260 962    Basic Metabolic Panel: Recent Labs  Lab 06/09/21 1518 06/09/21 1616 06/10/21 0000 06/10/21 0831 06/11/21 0352  NA 136 137  --  138 142  K 4.9 4.6  --  4.2 4.9  CL 89* 87*  --  92* 92*  CO2 38* 41*   --  36* 41*  GLUCOSE 112* 111*  --  168* 169*  BUN 24* 23  --  26* 23  CREATININE 0.77 0.76 0.78 0.80 0.66  CALCIUM 9.1 9.3  --  9.0 8.7*   GFR: Estimated Creatinine Clearance: 80.3 mL/min (by C-G formula based on SCr of 0.66 mg/dL). Recent Labs  Lab 06/09/21 1616 06/09/21 1726 06/10/21 0000 06/10/21 0831 06/11/21 0352  PROCALCITON  --   --  <0.10 <0.10  --   WBC 10.2  --  11.6* 12.8* 14.5*  LATICACIDVEN 1.4 1.3  --   --   --     Liver Function Tests: Recent Labs  Lab 06/09/21 1616 06/10/21 0831  AST 17 15  ALT 13 11  ALKPHOS 61 55  BILITOT 0.9 1.0  PROT 7.9 7.1  ALBUMIN 3.6 3.3*   No results for input(s): LIPASE, AMYLASE in the last 168 hours. No results for input(s): AMMONIA in the last 168 hours.  ABG    Component Value Date/Time   PHART 7.338 (L) 06/11/2021 1030   PCO2ART 82.2 (HH) 06/11/2021 1030   PO2ART 71.4 (L) 06/11/2021 1030   HCO3 43.0 (H) 06/11/2021 1030   TCO2 28 12/16/2014 0521   O2SAT 93.4 06/11/2021 1030     Coagulation Profile: Recent Labs  Lab 06/09/21 1616  INR 1.1    Cardiac Enzymes: No results for input(s): CKTOTAL, CKMB, CKMBINDEX, TROPONINI in the last 168 hours.  HbA1C: Hgb A1c MFr Bld  Date/Time Value Ref Range Status  04/24/2020 03:38 AM 5.8 (H) 4.8 - 5.6 % Final    Comment:    (NOTE) Pre diabetes:          5.7%-6.4%  Diabetes:              >6.4%  Glycemic control for   <7.0% adults with diabetes   08/05/2014 03:25 AM 5.7 (H) 4.8 - 5.6 % Final    Comment:    (NOTE)         Pre-diabetes: 5.7 - 6.4         Diabetes: >6.4         Glycemic control for adults with diabetes: <7.0     CBG: No results for input(s): GLUCAP in the last 168 hours.     Critical care time: 30 minutes discussing end of life care, pulmonary toilette issues and NIMV    Roselie Awkward, MD [facility] Pager: [phone] Cell: [phone] After 7:00 pm call Elink  [phone]

## 2021-06-11 NOTE — Discharge Instructions (Signed)
Information on my medicine - ELIQUIS (apixaban)  Why was Eliquis prescribed for you? Eliquis was prescribed to treat blood clots that may have been found in the veins of your legs (deep vein thrombosis) or in your lungs (pulmonary embolism) and to reduce the risk of them occurring again.  What do You need to know about Eliquis ? The starting dose is 10 mg (two 5 mg tablets) taken TWICE daily for the FIRST SEVEN (7) DAYS, then on (enter date)  06/17/21  the dose is reduced to ONE 5 mg tablet taken TWICE daily.  Eliquis may be taken with or without food.   Try to take the dose about the same time in the morning and in the evening. If you have difficulty swallowing the tablet whole please discuss with your pharmacist how to take the medication safely.  Take Eliquis exactly as prescribed and DO NOT stop taking Eliquis without talking to the doctor who prescribed the medication.  Stopping may increase your risk of developing a new blood clot.  Refill your prescription before you run out.  After discharge, you should have regular check-up appointments with your healthcare provider that is prescribing your Eliquis.    What do you do if you miss a dose? If a dose of ELIQUIS is not taken at the scheduled time, take it as soon as possible on the same day and twice-daily administration should be resumed. The dose should not be doubled to make up for a missed dose.  Important Safety Information A possible side effect of Eliquis is bleeding. You should call your healthcare provider right away if you experience any of the following: Bleeding from an injury or your nose that does not stop. Unusual colored urine (red or dark brown) or unusual colored stools (red or black). Unusual bruising for unknown reasons. A serious fall or if you hit your head (even if there is no bleeding).  Some medicines may interact with Eliquis and might increase your risk of bleeding or clotting while on Eliquis. To  help avoid this, consult your healthcare provider or pharmacist prior to using any new prescription or non-prescription medications, including herbals, vitamins, non-steroidal anti-inflammatory drugs (NSAIDs) and supplements.  This website has more information on Eliquis (apixaban): http://www.eliquis.com/eliquis/home

## 2021-06-12 DIAGNOSIS — J9611 Chronic respiratory failure with hypoxia: Secondary | ICD-10-CM | POA: Diagnosis not present

## 2021-06-12 DIAGNOSIS — J189 Pneumonia, unspecified organism: Secondary | ICD-10-CM | POA: Diagnosis not present

## 2021-06-12 DIAGNOSIS — J449 Chronic obstructive pulmonary disease, unspecified: Secondary | ICD-10-CM | POA: Diagnosis not present

## 2021-06-12 DIAGNOSIS — J9621 Acute and chronic respiratory failure with hypoxia: Secondary | ICD-10-CM | POA: Diagnosis not present

## 2021-06-12 LAB — BASIC METABOLIC PANEL
Anion gap: 7 (ref 5–15)
BUN: 27 mg/dL — ABNORMAL HIGH (ref 8–23)
CO2: 41 mmol/L — ABNORMAL HIGH (ref 22–32)
Calcium: 8.6 mg/dL — ABNORMAL LOW (ref 8.9–10.3)
Chloride: 91 mmol/L — ABNORMAL LOW (ref 98–111)
Creatinine, Ser: 1.05 mg/dL (ref 0.61–1.24)
GFR, Estimated: >60 mL/min (ref >60)
Glucose, Bld: 137 mg/dL — ABNORMAL HIGH (ref 70–99)
Potassium: 4.2 mmol/L (ref 3.5–5.1)
Sodium: 139 mmol/L (ref 135–145)

## 2021-06-12 LAB — CBC
HCT: 44 % (ref 39.0–52.0)
Hemoglobin: 12.8 g/dL — ABNORMAL LOW (ref 13.0–17.0)
MCH: 26.9 pg (ref 26.0–34.0)
MCHC: 29.1 g/dL — ABNORMAL LOW (ref 30.0–36.0)
MCV: 92.6 fL (ref 80.0–100.0)
Platelets: 249 10*3/uL (ref 150–400)
RBC: 4.75 MIL/uL (ref 4.22–5.81)
RDW: 14.1 % (ref 11.5–15.5)
WBC: 13.5 10*3/uL — ABNORMAL HIGH (ref 4.0–10.5)
nRBC: 0 % (ref 0.0–0.2)

## 2021-06-12 LAB — CULTURE, RESPIRATORY W GRAM STAIN: Culture: NORMAL

## 2021-06-12 MED ORDER — MAGNESIUM HYDROXIDE 400 MG/5ML PO SUSP
30.0000 mL | Freq: Every day | ORAL | Status: DC | PRN
  Administered 2021-06-12: 30 mL via ORAL
  Filled 2021-06-12: qty 30

## 2021-06-12 MED ORDER — CHLORHEXIDINE GLUCONATE 0.12 % MT SOLN
15.0000 mL | Freq: Two times a day (BID) | OROMUCOSAL | Status: DC
  Administered 2021-06-12 – 2021-06-16 (×6): 15 mL via OROMUCOSAL
  Filled 2021-06-12 (×7): qty 15

## 2021-06-12 MED ORDER — BISACODYL 10 MG RE SUPP
10.0000 mg | Freq: Once | RECTAL | Status: AC
  Administered 2021-06-12: 10 mg via RECTAL
  Filled 2021-06-12: qty 1

## 2021-06-12 MED ORDER — POLYETHYLENE GLYCOL 3350 17 G PO PACK
17.0000 g | PACK | Freq: Every day | ORAL | Status: DC
  Administered 2021-06-12 – 2021-06-16 (×5): 17 g via ORAL
  Filled 2021-06-12 (×5): qty 1

## 2021-06-12 MED ORDER — ORAL CARE MOUTH RINSE
15.0000 mL | Freq: Two times a day (BID) | OROMUCOSAL | Status: DC
  Administered 2021-06-12: 15 mL via OROMUCOSAL

## 2021-06-12 NOTE — Progress Notes (Signed)
Patient ambulated from chair to bathroom, standby with walker.  SpO2 down to upper 70s on 2 L.  O2 increased to 5 L, SpO2 returned to 93% after 5 minutes upon returning to chair.    Angie Fava, RN

## 2021-06-12 NOTE — Progress Notes (Signed)
PROGRESS NOTE    Jeffrey Hines  KXF:[id number] DOB: 1945/12/29 DOA: 06/09/2021 PCP: [facility]    Brief Narrative:  76 year old male with a history of steroid-dependent/oxygen dependent COPD, bronchiectasis, previous history of lung cancer status post left upper lobe resection, presents with progressive shortness of breath.  Thought to be related to COPD/pneumonia/bronchiectasis.  CT cannot rule out pulmonary embolus.  Pulmonology following.  Currently on steroids, antibiotics, bronchodilators.  Also started on anticoagulation.   Assessment & Plan:   Principal Problem:   Pneumonia Active Problems:   COPD with chronic bronchitis (HCC)   Hypertension   Anxiety state   Chronic respiratory failure with hypoxia (HCC)   Pressure injury of skin   Acute on chronic respiratory failure with hypoxia and hypercapnia -Secondary to pneumonia/COPD, possible element of PE -Seen by pulmonology, appreciate input -CT angiogram of the chest does not show any large central pulmonary embolus, but could not rule out RLL PE -He does show progressive consolidation of the left lower lobe -Currently appears to be comfortable on 6 L of oxygen while at rest, although his wife does note that he becomes significantly on hypoxic on minimal exertion -Due to rising PCO2 levels and confusion/somnolence on 1/6, he was started on BiPAP -Overall mental status is better, recommendations were to pulmonology to continue BiPAP nightly -Chest percussion therapy with a vest was also recommended, patient reports that he has not been able to tolerate this in the past due to pain associated with previous rib fractures.    Metabolic encephalopathy -secondary to hypercapnia -improving with bipap   Pneumonia/bronchiectasis -Previous history of Pseudomonas isolated from sputum -Repeat sputum culture in process.  Pneumococcal antigen negative.  Legionella antigen in process -He has been started on Zosyn, and  azithromycin -Discussed with pulmonology, with recommendation to continue Zosyn for total of 14 days  Acute pulmonary embolus -With ongoing tachycardia and hypoxia, patient was started on apixaban   COPD exacerbation -Chronically on prednisone 20 mg daily -He received a dose of IV steroids and is continued on prednisone 40 mg daily -Continue bronchodilators   Anxiety -Continue home dose of Klonopin, but changed to prn   Restless leg syndrome -Continue on Requip   Prior history of lung cancer, stage II, s/p resection of left upper lobe -CT scan of the chest done today shows interval increase in the size of the right middle lobe/perihilar nodule concerning for neoplastic process -We will defer further work-up to pulmonology.   Goals of care -Both myself and pulmonology have discussed goals of care with patient and his wife.  At this point, he wishes to continue full measures. -Ultimately, if he ends up on the ventilator, he would defer continuing these measures to his wife   DVT prophylaxis:  apixaban (ELIQUIS) tablet 10 mg  apixaban (ELIQUIS) tablet 5 mg  Code Status: Full code Family Communication: Discussed with wife at the bedside Disposition Plan: Status is: inpatient  Remains inpatient appropriate because: Continued management of respiratory failure due to pneumonia         Consultants:  Pulmonology  Procedures:    Antimicrobials:  Zosyn 1/4 > Azithromycin 1/4 >   Subjective: says he wore the BiPAP overnight and was able to sleep for a while.  He does feel better today.  Objective: Vitals:   06/12/21 0604 06/12/21 0938 06/12/21 1328 06/12/21 1440  BP: 100/75  118/82   Pulse: 95  (!) 115   Resp: 18  16   Temp: 97.8 F (36.6 C)  98.3 F (36.8 C)   TempSrc: Oral  Oral   SpO2: 96% 94% 90% 92%  Weight:      Height:        Intake/Output Summary (Last 24 hours) at 06/12/2021 1946 Last data filed at 06/12/2021 1800 Gross per 24 hour  Intake 1258.51 ml   Output 400 ml  Net 858.51 ml   Filed Weights   06/09/21 1405  Weight: 70.1 kg    Examination:  General exam: Appears calm and comfortable  Respiratory system: Improved air movement bilaterally Cardiovascular system: S1 & S2 heard, tachycardic. No JVD, murmurs, rubs, gallops or clicks. No pedal edema. Gastrointestinal system: Abdomen is nondistended, soft and nontender. No organomegaly or masses felt. Normal bowel sounds heard. Central nervous system: No focal neurological deficits. Extremities: Symmetric 5 x 5 power. Skin: No rashes, lesions or ulcers Psychiatry: Awake and alert     Data Reviewed: I have personally reviewed following labs and imaging studies  CBC: Recent Labs  Lab 06/09/21 1616 06/10/21 0000 06/10/21 0831 06/11/21 0352 06/12/21 0337  WBC 10.2 11.6* 12.8* 14.5* 13.5*  HGB 15.4 13.9 13.8 13.4 12.8*  HCT 51.3 45.9 45.4 45.6 44.0  MCV 90.6 89.6 89.5 91.6 92.6  PLT 250 251 260 229 093   Basic Metabolic Panel: Recent Labs  Lab 06/09/21 1518 06/09/21 1616 06/10/21 0000 06/10/21 0831 06/11/21 0352 06/12/21 0337  NA 136 137  --  138 142 139  K 4.9 4.6  --  4.2 4.9 4.2  CL 89* 87*  --  92* 92* 91*  CO2 38* 41*  --  36* 41* 41*  GLUCOSE 112* 111*  --  168* 169* 137*  BUN 24* 23  --  26* 23 27*  CREATININE 0.77 0.76 0.78 0.80 0.66 1.05  CALCIUM 9.1 9.3  --  9.0 8.7* 8.6*   GFR: Estimated Creatinine Clearance: 61.2 mL/min (by C-G formula based on SCr of 1.05 mg/dL). Liver Function Tests: Recent Labs  Lab 06/09/21 1616 06/10/21 0831  AST 17 15  ALT 13 11  ALKPHOS 61 55  BILITOT 0.9 1.0  PROT 7.9 7.1  ALBUMIN 3.6 3.3*   No results for input(s): LIPASE, AMYLASE in the last 168 hours. No results for input(s): AMMONIA in the last 168 hours. Coagulation Profile: Recent Labs  Lab 06/09/21 1616  INR 1.1   Cardiac Enzymes: No results for input(s): CKTOTAL, CKMB, CKMBINDEX, TROPONINI in the last 168 hours. BNP (last 3 results) No results for  input(s): PROBNP in the last 8760 hours. HbA1C: No results for input(s): HGBA1C in the last 72 hours. CBG: No results for input(s): GLUCAP in the last 168 hours. Lipid Profile: No results for input(s): CHOL, HDL, LDLCALC, TRIG, CHOLHDL, LDLDIRECT in the last 72 hours. Thyroid Function Tests: No results for input(s): TSH, T4TOTAL, FREET4, T3FREE, THYROIDAB in the last 72 hours. Anemia Panel: No results for input(s): VITAMINB12, FOLATE, FERRITIN, TIBC, IRON, RETICCTPCT in the last 72 hours. Sepsis Labs: Recent Labs  Lab 06/09/21 1616 06/09/21 1726 06/10/21 0000 06/10/21 0831  PROCALCITON  --   --  <0.10 <0.10  LATICACIDVEN 1.4 1.3  --   --     Recent Results (from the past 240 hour(s))  Resp Panel by RT-PCR (Flu A&B, Covid) Nasopharyngeal Swab     Status: None   Collection Time: 06/09/21  3:26 PM   Specimen: Nasopharyngeal Swab; Nasopharyngeal(NP) swabs in vial transport medium  Result Value Ref Range Status   SARS Coronavirus 2 by RT PCR NEGATIVE  NEGATIVE Final    Comment: (NOTE) SARS-CoV-2 target nucleic acids are NOT DETECTED.  The SARS-CoV-2 RNA is generally detectable in upper respiratory specimens during the acute phase of infection. The lowest concentration of SARS-CoV-2 viral copies this assay can detect is 138 copies/mL. A negative result does not preclude SARS-Cov-2 infection and should not be used as the sole basis for treatment or other patient management decisions. A negative result may occur with  improper specimen collection/handling, submission of specimen other than nasopharyngeal swab, presence of viral mutation(s) within the areas targeted by this assay, and inadequate number of viral copies(<138 copies/mL). A negative result must be combined with clinical observations, patient history, and epidemiological information. The expected result is Negative.  Fact Sheet for Patients:  EntrepreneurPulse.com.au  Fact Sheet for Healthcare  Providers:  IncredibleEmployment.be  This test is no t yet approved or cleared by the [place] FDA and  has been authorized for detection and/or diagnosis of SARS-CoV-2 by FDA under an Emergency Use Authorization (EUA). This EUA will remain  in effect (meaning this test can be used) for the duration of the COVID-19 declaration under Section 564(b)(1) of the Act, 21 U.S.C.section 360bbb-3(b)(1), unless the authorization is terminated  or revoked sooner.       Influenza A by PCR NEGATIVE NEGATIVE Final   Influenza B by PCR NEGATIVE NEGATIVE Final    Comment: (NOTE) The Xpert Xpress SARS-CoV-2/FLU/RSV plus assay is intended as an aid in the diagnosis of influenza from Nasopharyngeal swab specimens and should not be used as a sole basis for treatment. Nasal washings and aspirates are unacceptable for Xpert Xpress SARS-CoV-2/FLU/RSV testing.  Fact Sheet for Patients: EntrepreneurPulse.com.au  Fact Sheet for Healthcare Providers: IncredibleEmployment.be  This test is not yet approved or cleared by the [place] FDA and has been authorized for detection and/or diagnosis of SARS-CoV-2 by FDA under an Emergency Use Authorization (EUA). This EUA will remain in effect (meaning this test can be used) for the duration of the COVID-19 declaration under Section 564(b)(1) of the Act, 21 U.S.C. section 360bbb-3(b)(1), unless the authorization is terminated or revoked.  Performed at [facility], [place] [address] [place], [place] [address]   Culture, blood (Routine x 2)     Status: None (Preliminary result)   Collection Time: 06/09/21  4:16 PM   Specimen: BLOOD  Result Value Ref Range Status   Specimen Description   Final    BLOOD RIGHT ANTECUBITAL Performed at [place] [address], [place], [place] [address]    Special Requests   Final    BOTTLES DRAWN AEROBIC AND ANAEROBIC Blood  Culture results may not be optimal due to an excessive volume of blood received in culture bottles Performed at [place] [address] [place], [place] [address]    Culture   Final    NO GROWTH 3 DAYS Performed at [facility], [place] [address] [place], [place] [address]    Report Status PENDING  Incomplete  Culture, blood (Routine x 2)     Status: None (Preliminary result)   Collection Time: 06/09/21  4:16 PM   Specimen: BLOOD  Result Value Ref Range Status   Specimen Description   Final    BLOOD BLOOD RIGHT FOREARM Performed at [facility] [place] [address], [place], [place] [address]    Special Requests   Final    BOTTLES DRAWN AEROBIC AND ANAEROBIC Blood Culture results may not be optimal due  to an excessive volume of blood received in culture bottles Performed at [place] [address], [place], [place] [address]    Culture   Final    NO GROWTH 3 DAYS Performed at [facility], [place] [address], [place], [place] [address]    Report Status PENDING  Incomplete  Expectorated Sputum Assessment w Gram Stain, Rflx to Resp Cult     Status: None   Collection Time: 06/10/21  3:01 PM   Specimen: Sputum  Result Value Ref Range Status   Specimen Description SPUTUM  Final   Special Requests NONE  Final   Sputum evaluation   Final    THIS SPECIMEN IS ACCEPTABLE FOR SPUTUM CULTURE Performed at [facility], [place] [address], [place], [place] [address]    Report Status 06/10/2021 FINAL  Final  Culture, Respiratory w Gram Stain     Status: None   Collection Time: 06/10/21  3:01 PM   Specimen: SPU  Result Value Ref Range Status   Specimen Description   Final    SPUTUM Performed at [place] [address], [place], [place] [address]    Special Requests   Final    NONE Reflexed from [id number] Performed at [facility], [place] [address], [place],  [place] [address]    Gram Stain   Final    MODERATE WBC PRESENT,BOTH PMN AND MONONUCLEAR FEW GRAM POSITIVE COCCI IN PAIRS RARE BUDDING YEAST SEEN    Culture   Final    RARE Normal respiratory flora-no Staph aureus or Pseudomonas seen Performed at [facility], [address] [place], [place] [address]    Report Status 06/12/2021 FINAL  Final         Radiology Studies: No results found.      Scheduled Meds:  apixaban  10 mg Oral BID   Followed by   Derrill Memo ON 06/17/2021] apixaban  5 mg Oral BID   budesonide (PULMICORT) nebulizer solution  0.5 mg Nebulization Q12H   chlorhexidine  15 mL Mouth Rinse BID   furosemide  20 mg Oral Once per day on Mon Wed Thu Sat   And   furosemide  40 mg Oral Once per day on Sun Tue Fri   guaiFENesin  600 mg Oral BID   ipratropium-albuterol  3 mL Nebulization Q6H   mouth rinse  15 mL Mouth Rinse q12n4p   polyethylene glycol  17 g Oral Daily   predniSONE  40 mg Oral QPC supper   rOPINIRole  2 mg Oral QHS   sodium chloride HYPERTONIC  4 mL Nebulization QID   tamsulosin  0.4 mg Oral Daily   Continuous Infusions:  azithromycin 500 mg (06/12/21 0400)   piperacillin-tazobactam (ZOSYN)  IV 3.375 g (06/12/21 1354)     LOS: 3 days    Time spent: 72mins    Kathie Dike, MD [facility]   If 7PM-7AM, please contact night-coverage [email or web address]  06/12/2021, 7:46 PM

## 2021-06-12 NOTE — Evaluation (Signed)
Physical Therapy Evaluation Patient Details Name: Jeffrey Hines MRN: [id number] DOB: 1945/12/12 Today's Date: 06/12/2021  History of Present Illness  Pt is 75 yo male admitted on 06/09/20 with resp failure due to bronchiectasis exacerbation and possible PE.  Pt with hx of bronchiectasis, COPD, lung CA with lobe resection.  Clinical Impression  Pt admitted with above diagnosis. At baseline, pt on at least 4 L home O2 and is independent with ambulation and ADLs.  He can ambulate in home and depending on the day short community distances.  Today, pt requiring min guard for safety and ambulated 75' with a drop in O2 (see below).  He presents with decreased endurance and mobility - expected to progress well for return home at d/c. Pt currently with functional limitations due to the deficits listed below (see PT Problem List). Pt will benefit from skilled PT to increase their independence and safety with mobility to allow discharge to the venue listed below.      Pt was on 4 L rest sats 93%, RN increased to 5 L in anticipation for activity.  On 5 L transferred to chair with sats 91%.  For ambulation using 6 L (5 not option on tank).  In first 45' maintained 88% but dropped to 84% in last 53' then to 81% during recovery.  Did recover to 90% in ~1.5 mins and then able to gradually wean back to 4 L and at 92%.    Recommendations for follow up therapy are one component of a multi-disciplinary discharge planning process, led by the attending physician.  Recommendations may be updated based on patient status, additional functional criteria and insurance authorization.  Follow Up Recommendations Home health PT    Assistance Recommended at Discharge Intermittent Supervision/Assistance  Patient can return home with the following       Equipment Recommendations Rolling walker (2 wheels)  Recommendations for Other Services       Functional Status Assessment Patient has had a recent decline in their  functional status and demonstrates the ability to make significant improvements in function in a reasonable and predictable amount of time.     Precautions / Restrictions Precautions Precautions: Other (comment) Precaution Comments: watch O2      Mobility  Bed Mobility Overal bed mobility: Independent                  Transfers Overall transfer level: Needs assistance Equipment used: None Transfers: Sit to/from Stand;Bed to chair/wheelchair/BSC Sit to Stand: Min guard   Step pivot transfers: Min guard       General transfer comment: Min guard and cues to focus on breathing    Ambulation/Gait Ambulation/Gait assistance: Min guard Gait Distance (Feet): 80 Feet Assistive device: Rolling walker (2 wheels) Gait Pattern/deviations: Step-through pattern;Decreased stride length Gait velocity: decreased     General Gait Details: Cues to focus on breathing in through nose.  See comments for vitals  Stairs            Wheelchair Mobility    Modified Rankin (Stroke Patients Only)       Balance Overall balance assessment: Needs assistance Sitting-balance support: No upper extremity supported Sitting balance-Leahy Scale: Good     Standing balance support: No upper extremity supported;Bilateral upper extremity supported Standing balance-Leahy Scale: Fair Standing balance comment: static stand and tx no support; RW to ambulate  Pertinent Vitals/Pain Pain Assessment: No/denies pain    Home Living Family/patient expects to be discharged to:: Private residence Living Arrangements: Spouse/significant other Available Help at Discharge: Family;Available 24 hours/day Type of Home: House Home Access: Stairs to enter Entrance Stairs-Rails: Right Entrance Stairs-Number of Steps: 3   Home Layout: One level Home Equipment: Shower seat;Grab bars - tub/shower Additional Comments: Wears O2 continuously around 4 L    Prior  Function Prior Level of Function : Independent/Modified Independent             Mobility Comments: ambulated household distances without AD; could ambulate short community distances depending on the day ADLs Comments: was independent with ADLS; assist with IADLs     Hand Dominance        Extremity/Trunk Assessment   Upper Extremity Assessment Upper Extremity Assessment: Overall WFL for tasks assessed    Lower Extremity Assessment Lower Extremity Assessment: Overall WFL for tasks assessed    Cervical / Trunk Assessment Cervical / Trunk Assessment: Normal  Communication   Communication: HOH  Cognition Arousal/Alertness: Awake/alert Behavior During Therapy: WFL for tasks assessed/performed Overall Cognitive Status: Within Functional Limits for tasks assessed                                          General Comments General comments (skin integrity, edema, etc.): Discussed RW use today for energy conservation.  Pt was on 4 L rest sats 93%, RN increased to 5 L in anticipation for activity.  On 5 L transferred to chair with sats 91%.  For ambulation using 6 L (5 not option on tank).  In first 15' maintained 88% but dropped to 84% in last 35' then to 81% during recovery.  Did recover to 90% in ~1.5 mins and then able to gradually wean back to 4 L and at 92%.    Exercises     Assessment/Plan    PT Assessment Patient needs continued PT services  PT Problem List Decreased strength;Decreased mobility;Decreased activity tolerance;Cardiopulmonary status limiting activity;Decreased balance       PT Treatment Interventions DME instruction;Therapeutic activities;Gait training;Therapeutic exercise;Patient/family education;Stair training;Balance training;Functional mobility training    PT Goals (Current goals can be found in the Care Plan section)  Acute Rehab PT Goals Patient Stated Goal: return home; breath better PT Goal Formulation: With patient/family Time  For Goal Achievement: 06/26/21 Potential to Achieve Goals: Good    Frequency Min 3X/week     Co-evaluation               AM-PAC PT "6 Clicks" Mobility  Outcome Measure Help needed turning from your back to your side while in a flat bed without using bedrails?: None Help needed moving from lying on your back to sitting on the side of a flat bed without using bedrails?: None Help needed moving to and from a bed to a chair (including a wheelchair)?: A Little Help needed standing up from a chair using your arms (e.g., wheelchair or bedside chair)?: A Little Help needed to walk in hospital room?: A Little Help needed climbing 3-5 steps with a railing? : A Little 6 Click Score: 20    End of Session Equipment Utilized During Treatment: Gait belt;Oxygen Activity Tolerance: Patient tolerated treatment well Patient left: with chair alarm set;in chair;with call bell/phone within reach;with family/visitor present Nurse Communication: Mobility status PT Visit Diagnosis: Other abnormalities of gait and mobility (R26.89)  Time: 4076-8088 PT Time Calculation (min) (ACUTE ONLY): 33 min   Charges:   PT Evaluation $PT Eval Moderate Complexity: 1 Mod PT Treatments $Gait Training: 8-22 mins        Abran Richard, PT Acute Rehab Services Pager [phone] [facility] 06/12/2021, 2:26 PM

## 2021-06-12 NOTE — Progress Notes (Signed)
NAME:  Jeffrey Hines, MRN:  [id number], DOB:  [birth year], LOS: 3 ADMISSION DATE:  06/09/2021, CONSULTATION DATE:  1/6 REFERRING MD:  Haywood Pao, CHIEF COMPLAINT:  Dyspnea   History of Present Illness:  76 y/o male with a complex pulmonary history presented with acute on chronic respiratory failure with hypercarbia due to a bronchiectasis exacerbation and PE.  PCCM consulted for the same.  Follows with Dr. Silas Flood in clinic.  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 Initial imaging: some increased density of Left hemithorax (although chronically has almost complete opacification of left hemithorax). RVP negative, NML WBC. + low grade fever. Admitted w/ working dx of Acute on chronic resp failure 2/2 AECOPD vs bronchiectasis flare vs PNA. Pulm asked to admit. Started zosyn, HT saline nebs, Oxygen, pulse steroids, and ordered CT angio 1/5 possible RLL filling defect, will start on apixaban, L sided Pna present 1/5 sputum culture :  mod wbc, few gpc, rare budding yeast >>>    Scheduled Meds:  apixaban  10 mg Oral BID   Followed by   Derrill Memo ON 06/17/2021] apixaban  5 mg Oral BID   budesonide (PULMICORT) nebulizer solution  0.5 mg Nebulization Q12H   furosemide  20 mg Oral Once per day on Mon Wed Thu Sat   And   furosemide  40 mg Oral Once per day on Sun Tue Fri   guaiFENesin  600 mg Oral BID   ipratropium-albuterol  3 mL Nebulization Q6H   predniSONE  40 mg Oral QPC supper   rOPINIRole  2 mg Oral QHS   sodium chloride HYPERTONIC  4 mL Nebulization QID   tamsulosin  0.4 mg Oral Daily   Continuous Infusions:   azithromycin 500 mg (06/12/21 0400)   piperacillin-tazobactam (ZOSYN)  IV 3.375 g (06/12/21 0548)   PRN Meds:.acetaminophen, albuterol, clonazePAM, fluticasone, loratadine, ondansetron **OR** ondansetron (ZOFRAN) IV    Interim History / Subjective:  Some better breathing though says he did not tol bipap well at all and would like to do without Congested sounding rattling cough/ flutter not helping much   Objective   Blood pressure 100/75, pulse 95, temperature 97.8 F (36.6 C), temperature source Oral, resp. rate 18, height 5\' 11"  (1.803 m), weight 70.1 kg, SpO2 94 %.    FiO2 (%):  [30 %-40 %] 30 %   Intake/Output Summary (Last 24 hours) at 06/12/2021 1050 Last data filed at 06/12/2021 0918 Gross per 24 hour  Intake 1521.58 ml  Output 1250 ml  Net 271.58 ml   Filed Weights   06/09/21 1405  Weight: 70.1 kg    Examination:  Tmax  98.4  General appearance:    chronically ill elderly wm able to lie back 45 degrees comfortabley  At Rest 02 sats  94% on 5lpm NP  No jvd Oropharynx clear,  mucosa nl Neck supple Lungs exp > insp rhonchi bilaterally RRR no s3 or or sign murmur Abd soft non tender   Extr warm with no edema   Neuro  Sensorium intact,  no apparent motor deficits       Resolved Hospital Problem list     Assessment & Plan:  Bronchiectasis with acute exacerbation  History of pseudomonas infection Severe COPD on oxygen Chronic respiratory failure with hypoxemia without significant chronic hypercarbia GERD Hypertension Anxiety Chronic low back pain Pulmonary embolism Dense left sided pneumonia Worsening hypercarbic respiratory failure due to the above  Plan Eliquis per pharmacy> continue  Continue IV zosyn with plans for 14 days of antibiotics depending on sputum culture result Continue hypertonic saline, flutter valve Rec  VEST Administer O2 to maintain O2 saturation 88-92% - no higher Up in chair as much as possible  I discussed the hypercarbia with  him and explained it's really compensatory up to a point with the alternative being more dep on bipap on NIV in some form and best way to avoid it is treat the underlying problem, maximize time up in chair and obviously avoid sedation/ pain meds unless active palliative/hospice setting.         Best Practice (right click and "Reselect all SmartList Selections" daily)   Per [facility]  Labs   CBC: Recent Labs  Lab 06/09/21 1616 06/10/21 0000 06/10/21 0831 06/11/21 0352 06/12/21 0337  WBC 10.2 11.6* 12.8* 14.5* 13.5*  HGB 15.4 13.9 13.8 13.4 12.8*  HCT 51.3 45.9 45.4 45.6 44.0  MCV 90.6 89.6 89.5 91.6 92.6  PLT 250 251 260 229 885    Basic Metabolic Panel: Recent Labs  Lab 06/09/21 1518 06/09/21 1616 06/10/21 0000 06/10/21 0831 06/11/21 0352 06/12/21 0337  NA 136 137  --  138 142 139  K 4.9 4.6  --  4.2 4.9 4.2  CL 89* 87*  --  92* 92* 91*  CO2 38* 41*  --  36* 41* 41*  GLUCOSE 112* 111*  --  168* 169* 137*  BUN 24* 23  --  26* 23 27*  CREATININE 0.77 0.76 0.78 0.80 0.66 1.05  CALCIUM 9.1 9.3  --  9.0 8.7* 8.6*   GFR: Estimated Creatinine Clearance: 61.2 mL/min (by C-G formula based on SCr of 1.05 mg/dL). Recent Labs  Lab 06/09/21 1616 06/09/21 1726 06/10/21 0000 06/10/21 0831 06/11/21 0352 06/12/21 0337  PROCALCITON  --   --  <0.10 <0.10  --   --   WBC 10.2  --  11.6* 12.8* 14.5* 13.5*  LATICACIDVEN 1.4 1.3  --   --   --   --     Liver Function Tests: Recent Labs  Lab 06/09/21 1616 06/10/21 0831  AST 17 15  ALT 13 11  ALKPHOS 61 55  BILITOT 0.9 1.0  PROT 7.9 7.1  ALBUMIN 3.6 3.3*   No results for input(s): LIPASE, AMYLASE in the last 168 hours. No results for input(s): AMMONIA in the last 168 hours.  ABG    Component Value Date/Time   PHART 7.338 (L) 06/11/2021 1030   PCO2ART 82.2 (HH) 06/11/2021 1030   PO2ART 71.4 (L) 06/11/2021 1030   HCO3 43.0 (H) 06/11/2021 1030   TCO2 28 12/16/2014 0521   O2SAT 93.4 06/11/2021 1030     Coagulation  Profile: Recent Labs  Lab 06/09/21 1616  INR 1.1    Cardiac Enzymes: No results for input(s): CKTOTAL, CKMB, CKMBINDEX, TROPONINI in the last 168 hours.  HbA1C: Hgb A1c MFr Bld  Date/Time Value Ref Range Status  04/24/2020 03:38 AM 5.8 (H) 4.8 - 5.6 % Final    Comment:    (NOTE) Pre diabetes:          5.7%-6.4%  Diabetes:              >6.4%  Glycemic control for   <  7.0% adults with diabetes   08/05/2014 03:25 AM 5.7 (H) 4.8 - 5.6 % Final    Comment:    (NOTE)         Pre-diabetes: 5.7 - 6.4         Diabetes: >6.4         Glycemic control for adults with diabetes: <7.0     CBG: No results for input(s): GLUCAP in the last 168 hours.       Christinia Gully, MD Pulmonary [facility] [place] [phone]   After 7:00 pm call Elink  [phone]

## 2021-06-13 DIAGNOSIS — J9611 Chronic respiratory failure with hypoxia: Secondary | ICD-10-CM | POA: Diagnosis not present

## 2021-06-13 DIAGNOSIS — J189 Pneumonia, unspecified organism: Secondary | ICD-10-CM | POA: Diagnosis not present

## 2021-06-13 DIAGNOSIS — J449 Chronic obstructive pulmonary disease, unspecified: Secondary | ICD-10-CM | POA: Diagnosis not present

## 2021-06-13 DIAGNOSIS — J9621 Acute and chronic respiratory failure with hypoxia: Secondary | ICD-10-CM | POA: Diagnosis not present

## 2021-06-13 LAB — BASIC METABOLIC PANEL
Anion gap: 8 (ref 5–15)
BUN: 30 mg/dL — ABNORMAL HIGH (ref 8–23)
CO2: 37 mmol/L — ABNORMAL HIGH (ref 22–32)
Calcium: 8.5 mg/dL — ABNORMAL LOW (ref 8.9–10.3)
Chloride: 95 mmol/L — ABNORMAL LOW (ref 98–111)
Creatinine, Ser: 0.94 mg/dL (ref 0.61–1.24)
GFR, Estimated: >60 mL/min (ref >60)
Glucose, Bld: 127 mg/dL — ABNORMAL HIGH (ref 70–99)
Potassium: 4.1 mmol/L (ref 3.5–5.1)
Sodium: 140 mmol/L (ref 135–145)

## 2021-06-13 MED ORDER — PREDNISONE 20 MG PO TABS
20.0000 mg | ORAL_TABLET | Freq: Every day | ORAL | Status: DC
  Administered 2021-06-14 – 2021-06-16 (×3): 20 mg via ORAL
  Filled 2021-06-13 (×3): qty 1

## 2021-06-13 NOTE — Progress Notes (Signed)
PROGRESS NOTE    Jeffrey Hines  DJS:[id number] DOB: Mar 16, 1946 DOA: 06/09/2021 PCP: [facility]    Brief Narrative:  76 year old male with a history of steroid-dependent/oxygen dependent COPD, bronchiectasis, previous history of lung cancer status post left upper lobe resection, presents with progressive shortness of breath.  Thought to be related to COPD/pneumonia/bronchiectasis.  CT cannot rule out pulmonary embolus.  Pulmonology following.  Currently on steroids, antibiotics, bronchodilators.  Also started on anticoagulation.   Assessment & Plan:   Principal Problem:   Pneumonia Active Problems:   COPD with chronic bronchitis (HCC)   Hypertension   Anxiety state   Chronic respiratory failure with hypoxia (HCC)   Pressure injury of skin   Acute on chronic respiratory failure with hypoxia and hypercapnia -Secondary to pneumonia/COPD, possible element of PE -Seen by pulmonology, appreciate input -CT angiogram of the chest does not show any large central pulmonary embolus, but could not rule out RLL PE -He does show progressive consolidation of the left lower lobe -Currently appears to be comfortable on 6 L of oxygen while at rest, although his wife does note that he becomes significantly on hypoxic on minimal exertion -Due to rising PCO2 levels and confusion/somnolence on 1/6, he was started on BiPAP -Overall mental status is better, recommendations were to pulmonology to continue BiPAP nightly -Chest percussion therapy with a vest was also recommended, patient reports that he has not been able to tolerate this in the past due to pain associated with previous rib fractures.   -We will defer to pulmonology if patient needs to be discharged home with nightly BiPAP  Metabolic encephalopathy -secondary to hypercapnia -improving with bipap   Pneumonia/bronchiectasis -Previous history of Pseudomonas isolated from sputum -Repeat sputum culture in process.   Pneumococcal antigen negative.  Legionella antigen in process -He has been started on Zosyn, and azithromycin -Discussed with pulmonology, with recommendation to continue Zosyn for total of 14 days  Acute pulmonary embolus -With ongoing tachycardia and hypoxia, patient was started on apixaban   COPD exacerbation -Chronically on prednisone 20 mg daily -He received a dose of IV steroids and is continued on prednisone 40 mg daily -We will plan on resuming home dose once 5 days of prednisone 40 mg are complete -Continue bronchodilators   Anxiety -Continue home dose of Klonopin, but changed to prn   Restless leg syndrome -Continue on Requip   Prior history of lung cancer, stage II, s/p resection of left upper lobe -CT scan of the chest done today shows interval increase in the size of the right middle lobe/perihilar nodule concerning for neoplastic process -We will defer further work-up to pulmonology.   Goals of care -Both myself and pulmonology have discussed goals of care with patient and his wife.  At this point, he wishes to continue full measures. -Ultimately, if he ends up on the ventilator, he would defer continuing these measures to his wife   DVT prophylaxis:  apixaban (ELIQUIS) tablet 10 mg  apixaban (ELIQUIS) tablet 5 mg  Code Status: Full code Family Communication: Discussed with patient at the bedside Disposition Plan: Status is: inpatient  Remains inpatient appropriate because: Continued management of respiratory failure due to pneumonia         Consultants:  Pulmonology  Procedures:    Antimicrobials:  Zosyn 1/4 > Azithromycin 1/4 > 1/9   Subjective: Wore his BiPAP overnight.  Says he tolerated it better.  He is feeling stronger today.  He was able to walk around the entire nursing  unit.  Objective: Vitals:   06/13/21 0811 06/13/21 1422 06/13/21 1957 06/13/21 2036  BP:    115/67  Pulse:    (!) 118  Resp:      Temp:    98 F (36.7 C)   TempSrc:    Oral  SpO2: 92% 92% (!) 88% 90%  Weight:      Height:        Intake/Output Summary (Last 24 hours) at 06/13/2021 2113 Last data filed at 06/13/2021 0839 Gross per 24 hour  Intake 120 ml  Output 800 ml  Net -680 ml   Filed Weights   06/09/21 1405  Weight: 70.1 kg    Examination:  General exam: Alert, awake, oriented x 3 Respiratory system: Fair air movement bilaterally, occasional rhonchi. Respiratory effort normal. Cardiovascular system: Regular, tachycardic. No murmurs, rubs, gallops. Gastrointestinal system: Abdomen is nondistended, soft and nontender. No organomegaly or masses felt. Normal bowel sounds heard. Central nervous system: Alert and oriented. No focal neurological deficits. Extremities: No C/C/E, +pedal pulses Skin: No rashes, lesions or ulcers Psychiatry: Judgement and insight appear normal. Mood & affect appropriate.      Data Reviewed: I have personally reviewed following labs and imaging studies  CBC: Recent Labs  Lab 06/09/21 1616 06/10/21 0000 06/10/21 0831 06/11/21 0352 06/12/21 0337  WBC 10.2 11.6* 12.8* 14.5* 13.5*  HGB 15.4 13.9 13.8 13.4 12.8*  HCT 51.3 45.9 45.4 45.6 44.0  MCV 90.6 89.6 89.5 91.6 92.6  PLT 250 251 260 229 220   Basic Metabolic Panel: Recent Labs  Lab 06/09/21 1616 06/10/21 0000 06/10/21 0831 06/11/21 0352 06/12/21 0337 06/13/21 0328  NA 137  --  138 142 139 140  K 4.6  --  4.2 4.9 4.2 4.1  CL 87*  --  92* 92* 91* 95*  CO2 41*  --  36* 41* 41* 37*  GLUCOSE 111*  --  168* 169* 137* 127*  BUN 23  --  26* 23 27* 30*  CREATININE 0.76 0.78 0.80 0.66 1.05 0.94  CALCIUM 9.3  --  9.0 8.7* 8.6* 8.5*   GFR: Estimated Creatinine Clearance: 68.4 mL/min (by C-G formula based on SCr of 0.94 mg/dL). Liver Function Tests: Recent Labs  Lab 06/09/21 1616 06/10/21 0831  AST 17 15  ALT 13 11  ALKPHOS 61 55  BILITOT 0.9 1.0  PROT 7.9 7.1  ALBUMIN 3.6 3.3*   No results for input(s): LIPASE, AMYLASE in the  last 168 hours. No results for input(s): AMMONIA in the last 168 hours. Coagulation Profile: Recent Labs  Lab 06/09/21 1616  INR 1.1   Cardiac Enzymes: No results for input(s): CKTOTAL, CKMB, CKMBINDEX, TROPONINI in the last 168 hours. BNP (last 3 results) No results for input(s): PROBNP in the last 8760 hours. HbA1C: No results for input(s): HGBA1C in the last 72 hours. CBG: No results for input(s): GLUCAP in the last 168 hours. Lipid Profile: No results for input(s): CHOL, HDL, LDLCALC, TRIG, CHOLHDL, LDLDIRECT in the last 72 hours. Thyroid Function Tests: No results for input(s): TSH, T4TOTAL, FREET4, T3FREE, THYROIDAB in the last 72 hours. Anemia Panel: No results for input(s): VITAMINB12, FOLATE, FERRITIN, TIBC, IRON, RETICCTPCT in the last 72 hours. Sepsis Labs: Recent Labs  Lab 06/09/21 1616 06/09/21 1726 06/10/21 0000 06/10/21 0831  PROCALCITON  --   --  <0.10 <0.10  LATICACIDVEN 1.4 1.3  --   --     Recent Results (from the past 240 hour(s))  Resp Panel by RT-PCR (Flu A&B,  Covid) Nasopharyngeal Swab     Status: None   Collection Time: 06/09/21  3:26 PM   Specimen: Nasopharyngeal Swab; Nasopharyngeal(NP) swabs in vial transport medium  Result Value Ref Range Status   SARS Coronavirus 2 by RT PCR NEGATIVE NEGATIVE Final    Comment: (NOTE) SARS-CoV-2 target nucleic acids are NOT DETECTED.  The SARS-CoV-2 RNA is generally detectable in upper respiratory specimens during the acute phase of infection. The lowest concentration of SARS-CoV-2 viral copies this assay can detect is 138 copies/mL. A negative result does not preclude SARS-Cov-2 infection and should not be used as the sole basis for treatment or other patient management decisions. A negative result may occur with  improper specimen collection/handling, submission of specimen other than nasopharyngeal swab, presence of viral mutation(s) within the areas targeted by this assay, and inadequate number of  viral copies(<138 copies/mL). A negative result must be combined with clinical observations, patient history, and epidemiological information. The expected result is Negative.  Fact Sheet for Patients:  EntrepreneurPulse.com.au  Fact Sheet for Healthcare Providers:  IncredibleEmployment.be  This test is no t yet approved or cleared by the [place] FDA and  has been authorized for detection and/or diagnosis of SARS-CoV-2 by FDA under an Emergency Use Authorization (EUA). This EUA will remain  in effect (meaning this test can be used) for the duration of the COVID-19 declaration under Section 564(b)(1) of the Act, 21 U.S.C.section 360bbb-3(b)(1), unless the authorization is terminated  or revoked sooner.       Influenza A by PCR NEGATIVE NEGATIVE Final   Influenza B by PCR NEGATIVE NEGATIVE Final    Comment: (NOTE) The Xpert Xpress SARS-CoV-2/FLU/RSV plus assay is intended as an aid in the diagnosis of influenza from Nasopharyngeal swab specimens and should not be used as a sole basis for treatment. Nasal washings and aspirates are unacceptable for Xpert Xpress SARS-CoV-2/FLU/RSV testing.  Fact Sheet for Patients: EntrepreneurPulse.com.au  Fact Sheet for Healthcare Providers: IncredibleEmployment.be  This test is not yet approved or cleared by the [place] FDA and has been authorized for detection and/or diagnosis of SARS-CoV-2 by FDA under an Emergency Use Authorization (EUA). This EUA will remain in effect (meaning this test can be used) for the duration of the COVID-19 declaration under Section 564(b)(1) of the Act, 21 U.S.C. section 360bbb-3(b)(1), unless the authorization is terminated or revoked.  Performed at [facility], [place] [address] [place], [place] [address]   Culture, blood (Routine x 2)     Status: None (Preliminary result)   Collection Time: 06/09/21  4:16  PM   Specimen: BLOOD  Result Value Ref Range Status   Specimen Description   Final    BLOOD RIGHT ANTECUBITAL Performed at [place] [address], [place], [place] [address]    Special Requests   Final    BOTTLES DRAWN AEROBIC AND ANAEROBIC Blood Culture results may not be optimal due to an excessive volume of blood received in culture bottles Performed at [place] [address] [place], [place] [address]    Culture   Final    NO GROWTH 4 DAYS Performed at [facility], [place] [address], [place], [place] [address]    Report Status PENDING  Incomplete  Culture, blood (Routine x 2)     Status: None (Preliminary result)   Collection Time: 06/09/21  4:16 PM   Specimen: BLOOD  Result Value Ref Range Status   Specimen Description   Final  BLOOD BLOOD RIGHT FOREARM Performed at [place] [address], [place], [place] [address]    Special Requests   Final    BOTTLES DRAWN AEROBIC AND ANAEROBIC Blood Culture results may not be optimal due to an excessive volume of blood received in culture bottles Performed at [facility] [address], [place], [place] [address]    Culture   Final    NO GROWTH 4 DAYS Performed at [facility], [place] [address] [place], [place] [address]    Report Status PENDING  Incomplete  Expectorated Sputum Assessment w Gram Stain, Rflx to Resp Cult     Status: None   Collection Time: 06/10/21  3:01 PM   Specimen: Sputum  Result Value Ref Range Status   Specimen Description SPUTUM  Final   Special Requests NONE  Final   Sputum evaluation   Final    THIS SPECIMEN IS ACCEPTABLE FOR SPUTUM CULTURE Performed at [facility], [place] [address] [place], [place] [address]    Report Status 06/10/2021 FINAL  Final  Culture, Respiratory w Gram Stain     Status: None   Collection Time: 06/10/21  3:01 PM   Specimen: SPU  Result Value Ref Range  Status   Specimen Description   Final    SPUTUM Performed at [place] [address], [place], [place] [address]    Special Requests   Final    NONE Reflexed from [id number] Performed at [facility], [place] [address] [place], [place] [address]    Gram Stain   Final    MODERATE WBC PRESENT,BOTH PMN AND MONONUCLEAR FEW GRAM POSITIVE COCCI IN PAIRS RARE BUDDING YEAST SEEN    Culture   Final    RARE Normal respiratory flora-no Staph aureus or Pseudomonas seen Performed at [facility], [address] [place], [place] [address]    Report Status 06/12/2021 FINAL  Final         Radiology Studies: No results found.      Scheduled Meds:  apixaban  10 mg Oral BID   Followed by   Derrill Memo ON 06/17/2021] apixaban  5 mg Oral BID   budesonide (PULMICORT) nebulizer solution  0.5 mg Nebulization Q12H   chlorhexidine  15 mL Mouth Rinse BID   guaiFENesin  600 mg Oral BID   ipratropium-albuterol  3 mL Nebulization Q6H   mouth rinse  15 mL Mouth Rinse q12n4p   polyethylene glycol  17 g Oral Daily   [START ON 06/14/2021] predniSONE  20 mg Oral Q breakfast   rOPINIRole  2 mg Oral QHS   sodium chloride HYPERTONIC  4 mL Nebulization QID   tamsulosin  0.4 mg Oral Daily   Continuous Infusions:  azithromycin 500 mg (06/13/21 0415)   piperacillin-tazobactam (ZOSYN)  IV 3.375 g (06/13/21 1417)     LOS: 4 days    Time spent: 5mins    Kathie Dike, MD [facility]   If 7PM-7AM, please contact night-coverage [email or web address]  06/13/2021, 9:13 PM

## 2021-06-13 NOTE — Evaluation (Signed)
Occupational Therapy Evaluation Patient Details Name: Jeffrey Hines MRN: [id number] DOB: 04/26/1946 Today's Date: 06/13/2021   History of Present Illness Pt is 76 yo male admitted on 06/09/20 with resp failure due to bronchiectasis exacerbation and possible PE.  Pt with hx of bronchiectasis, COPD, lung CA with lobe resection.   Clinical Impression   Patient is a 76 year old male who was noted to have had a functional decline with decreased cardiopulmonary tolerance, decreased standing balance, decreased safety awareness, decreased endurance impacting participation in ADLs. Patient was unable to maintain 88-92% on 4L/min during session with need to increase to 6L secondary to portable tank not having 5L increment.  Patient recommended to have supervision in next level of care for safety awareness and assistance with monitoring of vitals. Patient would continue to benefit from skilled OT services at this time while admitted and after d/c to address noted deficits in order to improve overall safety and independence in ADLs.       Recommendations for follow up therapy are one component of a multi-disciplinary discharge planning process, led by the attending physician.  Recommendations may be updated based on patient status, additional functional criteria and insurance authorization.   Follow Up Recommendations  Home health OT    Assistance Recommended at Discharge Frequent or constant Supervision/Assistance  Patient can return home with the following A little help with walking and/or transfers;A little help with bathing/dressing/bathroom;Direct supervision/assist for medications management    Functional Status Assessment  Patient has had a recent decline in their functional status and demonstrates the ability to make significant improvements in function in a reasonable and predictable amount of time.  Equipment Recommendations  None recommended by OT    Recommendations for Other Services        Precautions / Restrictions Precautions Precautions: Other (comment) Precaution Comments: watch O2 and HR Restrictions Weight Bearing Restrictions: No Other Position/Activity Restrictions: per pulmonolgy note maintain O2 beween 88% and 92%      Mobility Bed Mobility               General bed mobility comments: was up in recliner    Transfers Overall transfer level: Needs assistance Equipment used: Rolling walker (2 wheels) Transfers: Sit to/from Stand Sit to Stand: Min guard                  Balance Overall balance assessment: Needs assistance Sitting-balance support: No upper extremity supported Sitting balance-Leahy Scale: Good     Standing balance support: No upper extremity supported;During functional activity Standing balance-Leahy Scale: Fair Standing balance comment: static standing no UE support, needs UE support with movement                           ADL either performed or assessed with clinical judgement   ADL Overall ADL's : Needs assistance/impaired Eating/Feeding: Modified independent;Sitting   Grooming: Wash/dry face;Oral care;Standing;Min guard Grooming Details (indicate cue type and reason): noted to have HR increase to 135 bpm with patient talking through whole task. patient stood for 2 mins 30 seconds with SOB noted. patient dropped to 76% on 6L/min. O2 tank does not have 5L increments. took about 2 mins to come back to 88% on 6L Upper Body Bathing: Set up;Sitting   Lower Body Bathing: Minimal assistance;Sit to/from stand Lower Body Bathing Details (indicate cue type and reason): increased time and cues for safety Upper Body Dressing : Set up;Sitting   Lower Body Dressing: Sit  to/from stand;Sitting/lateral leans;Minimal assistance Lower Body Dressing Details (indicate cue type and reason): patient was able to complete modified 4 position of legs to don/doff socks seaed with increase HR to 119 bpm seated in chair with O2  maintaining in 90s on 4L/min while conneced to wall. Toilet Transfer: Min guard;Ambulation;Rolling walker (2 wheels) Toilet Transfer Details (indicate cue type and reason): with increased time and cues for safety. patient attempted on 4L/min with patient dropping to 70% with transition to bathroom with needing to bumpt o 6L secondary to O2 tank not having 5L inrecemnets. HR was noted to increase to 130 bpm. patient was able to get back to recliner and deep breath ack to 88% within 2 mins. cues to stop talking. Toileting- Clothing Manipulation and Hygiene: Minimal assistance;Sit to/from stand       Functional mobility during ADLs: Min guard;Rolling walker (2 wheels)       Vision Baseline Vision/History: 1 Wears glasses Ability to See in Adequate Light: 2 Moderately impaired Patient Visual Report: No change from baseline       Perception     Praxis      Pertinent Vitals/Pain Pain Assessment: No/denies pain     Hand Dominance Right   Extremity/Trunk Assessment Upper Extremity Assessment Upper Extremity Assessment: Overall WFL for tasks assessed   Lower Extremity Assessment Lower Extremity Assessment: Defer to PT evaluation   Cervical / Trunk Assessment Cervical / Trunk Assessment: Normal   Communication Communication Communication: HOH   Cognition Arousal/Alertness: Awake/alert Behavior During Therapy: WFL for tasks assessed/performed Overall Cognitive Status: Within Functional Limits for tasks assessed                                       General Comments       Exercises     Shoulder Instructions      Home Living Family/patient expects to be discharged to:: Private residence Living Arrangements: Spouse/significant other Available Help at Discharge: Family;Available 24 hours/day Type of Home: House Home Access: Stairs to enter [employer] of Steps: 3 Entrance Stairs-Rails: Right Home Layout: One level     Bathroom Shower/Tub:  Walk-in shower;Tub/shower unit   Bathroom Toilet: Handicapped height     Home Equipment: Shower seat;Grab bars - tub/shower   Additional Comments: Wears O2 continuously around 4 L      Prior Functioning/Environment Prior Level of Function : Independent/Modified Independent             Mobility Comments: ambulated household distances without AD; could ambulate short community distances depending on the day ADLs Comments: was independent with ADLS; assist with IADLs        OT Problem List: Cardiopulmonary status limiting activity;Impaired balance (sitting and/or standing);Decreased safety awareness;Decreased activity tolerance      OT Treatment/Interventions: Self-care/ADL training;Therapeutic exercise;Therapeutic activities;DME and/or AE instruction;Balance training;Patient/family education    OT Goals(Current goals can be found in the care plan section) Acute Rehab OT Goals Patient Stated Goal: to get out of hospital today OT Goal Formulation: With patient Time For Goal Achievement: 06/27/21 Potential to Achieve Goals: Good  OT Frequency: Min 2X/week    Co-evaluation              AM-PAC OT "6 Clicks" Daily Activity     Outcome Measure   Help from another person taking care of personal grooming?: A Little Help from another person toileting, which includes using toliet, bedpan, or urinal?:  A Little Help from another person bathing (including washing, rinsing, drying)?: A Little Help from another person to put on and taking off regular upper body clothing?: A Little Help from another person to put on and taking off regular lower body clothing?: A Little 6 Click Score: 15   End of Session Equipment Utilized During Treatment: Gait belt;Rolling walker (2 wheels) Nurse Communication: Mobility status  Activity Tolerance: Patient tolerated treatment well Patient left: in chair;with call bell/phone within reach;with chair alarm set  OT Visit Diagnosis: Unsteadiness on  feet (R26.81)                Time: 9390-3009 OT Time Calculation (min): 38 min Charges:  OT General Charges $OT Visit: 1 Visit OT Evaluation $OT Eval Low Complexity: 1 Low OT Treatments $Self Care/Home Management : 23-37 mins  Jackelyn Poling OTR/L, MS Acute Rehabilitation Department Office# [phone] Pager# [phone]   Marcellina Millin 06/13/2021, 10:50 AM

## 2021-06-14 DIAGNOSIS — J9621 Acute and chronic respiratory failure with hypoxia: Secondary | ICD-10-CM | POA: Diagnosis not present

## 2021-06-14 DIAGNOSIS — J449 Chronic obstructive pulmonary disease, unspecified: Secondary | ICD-10-CM | POA: Diagnosis not present

## 2021-06-14 DIAGNOSIS — J9622 Acute and chronic respiratory failure with hypercapnia: Secondary | ICD-10-CM

## 2021-06-14 DIAGNOSIS — J189 Pneumonia, unspecified organism: Secondary | ICD-10-CM | POA: Diagnosis not present

## 2021-06-14 DIAGNOSIS — J471 Bronchiectasis with (acute) exacerbation: Secondary | ICD-10-CM | POA: Diagnosis not present

## 2021-06-14 DIAGNOSIS — J9611 Chronic respiratory failure with hypoxia: Secondary | ICD-10-CM | POA: Diagnosis not present

## 2021-06-14 DIAGNOSIS — J441 Chronic obstructive pulmonary disease with (acute) exacerbation: Secondary | ICD-10-CM

## 2021-06-14 LAB — CULTURE, BLOOD (ROUTINE X 2)
Culture: NO GROWTH
Culture: NO GROWTH

## 2021-06-14 MED ORDER — BISACODYL 10 MG RE SUPP: 10.0000 mg | Freq: Every day | RECTAL | Status: DC | PRN

## 2021-06-14 MED ORDER — ARFORMOTEROL TARTRATE 15 MCG/2ML IN NEBU
15.0000 ug | INHALATION_SOLUTION | Freq: Two times a day (BID) | RESPIRATORY_TRACT | Status: DC
  Administered 2021-06-14 – 2021-06-16 (×5): 15 ug via RESPIRATORY_TRACT
  Filled 2021-06-14 (×5): qty 2

## 2021-06-14 MED ORDER — REVEFENACIN 175 MCG/3ML IN SOLN
175.0000 ug | Freq: Every day | RESPIRATORY_TRACT | Status: DC
  Administered 2021-06-15 – 2021-06-16 (×2): 175 ug via RESPIRATORY_TRACT
  Filled 2021-06-14 (×4): qty 3

## 2021-06-14 MED ORDER — SODIUM CHLORIDE 3 % IN NEBU
4.0000 mL | INHALATION_SOLUTION | Freq: Two times a day (BID) | RESPIRATORY_TRACT | Status: DC
  Administered 2021-06-14 – 2021-06-16 (×4): 4 mL via RESPIRATORY_TRACT
  Filled 2021-06-14 (×5): qty 4

## 2021-06-14 NOTE — Plan of Care (Signed)
°  Problem: Activity: Goal: Ability to tolerate increased activity will improve Outcome: Progressing   Problem: Respiratory: Goal: Ability to maintain a clear airway will improve Outcome: Progressing   Problem: Activity: Goal: Ability to tolerate increased activity will improve Outcome: Progressing   Problem: Respiratory: Goal: Ability to maintain adequate ventilation will improve Outcome: Progressing

## 2021-06-14 NOTE — Progress Notes (Signed)
Occupational Therapy Treatment Patient Details Name: Jeffrey Hines MRN: [id number] DOB: 1946-04-04 Today's Date: 06/14/2021   History of present illness Pt is 76 yo male admitted on 06/09/20 with resp failure due to bronchiectasis exacerbation and possible PE.  Pt with hx of bronchiectasis, COPD, lung CA with lobe resection.   OT comments  Patient was able to participate in bathing tasks EOB with maintaining O2 saturation on 5L/min with HR within normal range for activities. Patient was noted to make a few more comments about increased confusion this AM but reported he was clearer now. Nursing made aware. Patient would continue to benefit from skilled OT services at this time while admitted and after d/c to address noted deficits in order to improve overall safety and independence in ADLs.     Recommendations for follow up therapy are one component of a multi-disciplinary discharge planning process, led by the attending physician.  Recommendations may be updated based on patient status, additional functional criteria and insurance authorization.    Follow Up Recommendations  Home health OT    Assistance Recommended at Discharge Frequent or constant Supervision/Assistance  Patient can return home with the following  A little help with walking and/or transfers;A little help with bathing/dressing/bathroom;Direct supervision/assist for medications management   Equipment Recommendations  None recommended by OT    Recommendations for Other Services      Precautions / Restrictions Precautions Precautions: Other (comment) Precaution Comments: watch O2 and HR Restrictions Weight Bearing Restrictions: No Other Position/Activity Restrictions: per pulmonolgy note maintain O2 beween 88% and 92%       Mobility Bed Mobility Overal bed mobility: Independent                  Transfers                         Balance Overall balance assessment: Needs  assistance Sitting-balance support: No upper extremity supported Sitting balance-Leahy Scale: Good     Standing balance support: No upper extremity supported;During functional activity Standing balance-Leahy Scale: Fair                             ADL either performed or assessed with clinical judgement   ADL Overall ADL's : Needs assistance/impaired         Upper Body Bathing: Set up;Sitting Upper Body Bathing Details (indicate cue type and reason): EOB Lower Body Bathing: Minimal assistance;Sit to/from stand Lower Body Bathing Details (indicate cue type and reason): increased time and cues for safety Upper Body Dressing : Set up;Sitting       Toilet Transfer: Min guard;Rolling walker (2 wheels);Ambulation Toilet Transfer Details (indicate cue type and reason): to recliner in room for breakfast this AM. able to maintain O2 and HR within normal limited on 5L/min O2.                Extremity/Trunk Assessment              Vision       Perception     Praxis      Cognition Arousal/Alertness: Awake/alert Behavior During Therapy: WFL for tasks assessed/performed Overall Cognitive Status: Within Functional Limits for tasks assessed                                 General Comments: patient was noted to make some comments  about haivng difficulty orienting himself when transitioning from sleep to awake. patient reported he needed to calm down thoughts he was in an alien world this AM. nurse made aware          Exercises     Shoulder Instructions       General Comments      Pertinent Vitals/ Pain       Pain Assessment: No/denies pain  Home Living                                          Prior Functioning/Environment              Frequency  Min 2X/week        Progress Toward Goals  OT Goals(current goals can now be found in the care plan section)  Progress towards OT goals: Progressing toward  goals     Plan Discharge plan remains appropriate    Co-evaluation                 AM-PAC OT "6 Clicks" Daily Activity     Outcome Measure   Help from another person eating meals?: None Help from another person taking care of personal grooming?: A Little Help from another person toileting, which includes using toliet, bedpan, or urinal?: A Little Help from another person bathing (including washing, rinsing, drying)?: A Little Help from another person to put on and taking off regular upper body clothing?: A Little Help from another person to put on and taking off regular lower body clothing?: A Little 6 Click Score: 19    End of Session Equipment Utilized During Treatment: Gait belt;Rolling walker (2 wheels)  OT Visit Diagnosis: Unsteadiness on feet (R26.81)   Activity Tolerance Patient tolerated treatment well   Patient Left in chair;with call bell/phone within reach;with chair alarm set   Nurse Communication Mobility status        Time: 3244-0102 OT Time Calculation (min): 25 min  Charges: OT General Charges $OT Visit: 1 Visit OT Treatments $Self Care/Home Management : 23-37 mins  Jackelyn Poling OTR/L, MS Acute Rehabilitation Department Office# [phone] Pager# [phone]   Marcellina Millin 06/14/2021, 12:54 PM

## 2021-06-14 NOTE — Progress Notes (Signed)
Per patient request and previous conversation with MD, Patient does not want wear cpap tonight.  Patient will remain on his 5L [place] throughout the night

## 2021-06-14 NOTE — Progress Notes (Signed)
NAME:  Jeffrey Hines, MRN:  [id number], DOB:  07/17/1945, LOS: 5 ADMISSION DATE:  06/09/2021, CONSULTATION DATE:  06/11/2021 REFERRING MD:  Haywood Pao, CHIEF COMPLAINT:  Dyspnea   History of Present Illness:  76 yo male former smoker presented to [facility] ER with 2 days of worsening dyspnea.  SpO2 in 70's in ER.  He has hx of severe COPD with bronchiectasis on 5 liters oxygen.  Admitted for pneumonia, COPD and BTX exacerbations with acute on chronic respiratory failure.  Started on antibiotics and steroids.  PCCM consulted to assist with respiratory management.  Followed by Dr. Silas Flood in pulmonary office.  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 Initial imaging: some increased density of Left hemithorax (although chronically has almost complete opacification of left hemithorax). RVP negative, NML WBC. + low grade fever. Admitted w/ working dx of Acute on chronic resp failure 2/2 AECOPD vs bronchiectasis flare vs PNA. Pulm asked to admit. Started zosyn, HT saline nebs, Oxygen, pulse steroids, and ordered CT angio 1/5 possible RLL filling defect, will start on apixaban, L sided Pna present   Studies:  Spirometry 08/31/16 >> FEV1 1.2 (34%), FEV1% 42 Echo 09/10/19 >> EF 55 to 60%, grade 1 DD, mild MR, PASP 59 mmHg CT angio chest 06/09/21 >> s/p LUL ectomy with compensatory hyperinflation of Rt lung, chronic loculated effusion Lt apex, consolidation of LLL with air bronchogram, severe emphysema, 1 cm nodule RML increased in size compared to 04/17/20, 1 cm nodule RLL stable  Interim History /  Subjective:  Feels a 1000% better compared to admission.  Still has cough but now only bringing up phlegm when he uses flutter valve.    Objective   Blood pressure 122/83, pulse 98, temperature 97.6 F (36.4 C), temperature source Axillary, resp. rate (!) 23, height 5\' 11"  (1.803 m), weight 70.1 kg, SpO2 (!) 89 %.        Intake/Output Summary (Last 24 hours) at 06/14/2021 0918 Last data filed at 06/14/2021 0444 Gross per 24 hour  Intake 809.28 ml  Output 515 ml  Net 294.28 ml   Filed Weights   06/09/21 1405  Weight: 70.1 kg    Examination:  General - alert Eyes - pupils reactive ENT - no sinus tenderness, no stridor Cardiac - regular rate/rhythm, no murmur Chest - bronchial breath sounds with crackles on Lt, no wheeze Abdomen - soft, non tender, + bowel sounds Extremities - no cyanosis, clubbing, or edema Skin - no rashes Neuro - normal strength, moves extremities, follows commands Psych - normal mood and behavior  Assessment & Plan:   Acute on chronic hypoxic/hypercapnic respiratory failure. - goal SpO2 > 88 to 95%  Community acquired pneumonia with prior history of Pseudomonal respiratory infections. - day 6 of Abx, currently on zosyn - plan to complete 14 days of ABx - should be able to transition to augmentin to finish course of ABx  COPD, bronchiectasis exacerbation. - change to pulmicort, yupelri, brovana - prn albuterol - wean prednisone as tolerated to baseline dose of 20 mg daily - hypertonic saline nebulizer bid - mucinex, flutter valve - can resume stiolto when he is ready for d/c home  Possible pulmonary embolism in RLL. - apixaban per primary team  Hx of Stage 2b NSCLC (squamous cell). - s/p concurrent chemoradiation therapy with carboplatin and paclitaxel completed 09/22/14 - s/p Lt VATS with wedge resection of LLL and thoracoscopic LULectomy with mediastinal LN dissection by Dr. Roxan Hockey 12/15/14 - followed by Dr. Julien Nordmann with oncology  Goals of  care. - full code  I have scheduled him for outpt pulmonary follow up with Tammy Parrett on 06/28/21 at 78 am in [facility]  PCCM will sign off.  Please call if additional assistance needed while he is in hospital.  Labs    CMP Latest Ref Rng & Units 06/13/2021 06/12/2021 06/11/2021  Glucose 70 - 99 mg/dL 127(H) 137(H) 169(H)  BUN 8 - 23 mg/dL 30(H) 27(H) 23  Creatinine 0.61 - 1.24 mg/dL 0.94 1.05 0.66  Sodium 135 - 145 mmol/L 140 139 142  Potassium 3.5 - 5.1 mmol/L 4.1 4.2 4.9  Chloride 98 - 111 mmol/L 95(L) 91(L) 92(L)  CO2 22 - 32 mmol/L 37(H) 41(H) 41(H)  Calcium 8.9 - 10.3 mg/dL 8.5(L) 8.6(L) 8.7(L)  Total Protein 6.5 - 8.1 g/dL - - -  Total Bilirubin 0.3 - 1.2 mg/dL - - -  Alkaline Phos 38 - 126 U/L - - -  AST 15 - 41 U/L - - -  ALT 0 - 44 U/L - - -    CBC Latest Ref Rng & Units 06/12/2021 06/11/2021 06/10/2021  WBC 4.0 - 10.5 K/uL 13.5(H) 14.5(H) 12.8(H)  Hemoglobin 13.0 - 17.0 g/dL 12.8(L) 13.4 13.8  Hematocrit 39.0 - 52.0 % 44.0 45.6 45.4  Platelets 150 - 400 K/uL 249 229 260    ABG    Component Value Date/Time   PHART 7.338 (L) 06/11/2021 1030   PCO2ART 82.2 (HH) 06/11/2021 1030   PO2ART 71.4 (L) 06/11/2021 1030   HCO3 43.0 (H) 06/11/2021 1030   TCO2 28 12/16/2014 0521   O2SAT 93.4 06/11/2021 1030   Signature:  Chesley Mires, MD [place] Pager - [phone] 06/14/2021, 9:57 AM

## 2021-06-14 NOTE — Progress Notes (Addendum)
PROGRESS NOTE    Jeffrey Hines  WLS:[id number] DOB: 05-21-1946 DOA: 06/09/2021 PCP: [facility]    Brief Narrative:  76 year old male with a history of steroid-dependent/oxygen dependent COPD, bronchiectasis, previous history of lung cancer status post left upper lobe resection, presents with progressive shortness of breath.  Thought to be related to COPD/pneumonia/bronchiectasis.  CT cannot rule out pulmonary embolus.  Pulmonology following.  Currently on steroids, antibiotics, bronchodilators.  Also started on anticoagulation.   Assessment & Plan:   Principal Problem:   Pneumonia Active Problems:   COPD with chronic bronchitis (HCC)   Hypertension   Anxiety state   Chronic respiratory failure with hypoxia (HCC)   Pressure injury of skin   Acute on chronic respiratory failure with hypoxia and hypercapnia -Secondary to pneumonia/COPD, possible element of PE -Seen by pulmonology, appreciate input -CT angiogram of the chest does not show any large central pulmonary embolus, but could not rule out RLL PE -He does show progressive consolidation of the left lower lobe -Currently appears to be comfortable on 6 L of oxygen while at rest, although his wife does note that he becomes significantly on hypoxic on minimal exertion -Due to rising PCO2 levels and confusion/somnolence on 1/6, he was started on BiPAP -Overall mental status is better, recommendations were to pulmonology to continue BiPAP nightly -Chest percussion therapy with a vest was also recommended, patient reports that he has not been able to tolerate this in the past due to pain associated with previous rib fractures.   -Discussed with pulmonology, patient will not discharged with BiPAP at this time.  This will be readdressed on his follow-up in 2 weeks. -We will try the patient off BiPAP tonight and reassess mental status and respiratory status in a.m.  Metabolic encephalopathy -secondary to  hypercapnia -improving with bipap   Pneumonia/bronchiectasis -Previous history of Pseudomonas isolated from sputum -Repeat sputum culture in process.  Pneumococcal antigen negative.  Legionella antigen in process -He has been started on Zosyn, and completed 5 days of azithromycin -Discussed with pulmonology, Dr. Silas Flood who feels patient would benefit from 14 days of zosyn -Discussed with ID, Dr. Juleen China who felt this plan was reasonable -Placed order for PICC line  Acute pulmonary embolus -With ongoing tachycardia and hypoxia, patient was started on apixaban   COPD exacerbation -Chronically on prednisone 20 mg daily -He received a dose of IV steroids and is continued on prednisone 40 mg daily -Completed prednisone 40 mg for 5 days and now has returned to baseline dose of 20 mg daily -Continue bronchodilators   Anxiety -Continue home dose of Klonopin, but changed to prn   Restless leg syndrome -Continue on Requip   Prior history of lung cancer, stage II, s/p resection of left upper lobe -CT scan of the chest done today shows interval increase in the size of the right middle lobe/perihilar nodule concerning for neoplastic process -We will defer further work-up to pulmonology.   Goals of care -Both myself and pulmonology have discussed goals of care with patient and his wife.  At this point, he wishes to continue full measures. -Ultimately, if he ends up on the ventilator, he would defer continuing these measures to his wife   DVT prophylaxis:  apixaban (ELIQUIS) tablet 10 mg  apixaban (ELIQUIS) tablet 5 mg  Code Status: Full code Family Communication: Discussed with patient at the bedside Disposition Plan: Status is: inpatient  Remains inpatient appropriate because: Continued management of respiratory failure due to pneumonia  Consultants:  Pulmonology  Procedures:    Antimicrobials:  Zosyn 1/4 > Azithromycin 1/4 > 1/9   Subjective: Had a  difficult time sleeping last night with BiPAP on.  Overall feels that his breathing is continuing to improve.  Objective: Vitals:   06/14/21 0455 06/14/21 1129 06/14/21 2003 06/14/21 2107  BP: 122/83 124/81 124/84   Pulse: 98 90 97   Resp:  20    Temp: 97.6 F (36.4 C) (!) 97.5 F (36.4 C) 97.7 F (36.5 C)   TempSrc: Axillary Oral Oral   SpO2: (!) 89% 97% 96% 94%  Weight:      Height:        Intake/Output Summary (Last 24 hours) at 06/14/2021 2124 Last data filed at 06/14/2021 1300 Gross per 24 hour  Intake 1556.45 ml  Output 515 ml  Net 1041.45 ml   Filed Weights   06/09/21 1405  Weight: 70.1 kg    Examination:  General exam: Alert, awake, oriented x 3 Respiratory system: Fair air movement bilaterally, occasional rhonchi. Respiratory effort normal. Cardiovascular system: Regular, tachycardic. No murmurs, rubs, gallops. Gastrointestinal system: Abdomen is nondistended, soft and nontender. No organomegaly or masses felt. Normal bowel sounds heard. Central nervous system: Alert and oriented. No focal neurological deficits. Extremities: No C/C/E, +pedal pulses Skin: No rashes, lesions or ulcers Psychiatry: Judgement and insight appear normal. Mood & affect appropriate.      Data Reviewed: I have personally reviewed following labs and imaging studies  CBC: Recent Labs  Lab 06/09/21 1616 06/10/21 0000 06/10/21 0831 06/11/21 0352 06/12/21 0337  WBC 10.2 11.6* 12.8* 14.5* 13.5*  HGB 15.4 13.9 13.8 13.4 12.8*  HCT 51.3 45.9 45.4 45.6 44.0  MCV 90.6 89.6 89.5 91.6 92.6  PLT 250 251 260 229 347   Basic Metabolic Panel: Recent Labs  Lab 06/09/21 1616 06/10/21 0000 06/10/21 0831 06/11/21 0352 06/12/21 0337 06/13/21 0328  NA 137  --  138 142 139 140  K 4.6  --  4.2 4.9 4.2 4.1  CL 87*  --  92* 92* 91* 95*  CO2 41*  --  36* 41* 41* 37*  GLUCOSE 111*  --  168* 169* 137* 127*  BUN 23  --  26* 23 27* 30*  CREATININE 0.76 0.78 0.80 0.66 1.05 0.94  CALCIUM 9.3  --   9.0 8.7* 8.6* 8.5*   GFR: Estimated Creatinine Clearance: 68.4 mL/min (by C-G formula based on SCr of 0.94 mg/dL). Liver Function Tests: Recent Labs  Lab 06/09/21 1616 06/10/21 0831  AST 17 15  ALT 13 11  ALKPHOS 61 55  BILITOT 0.9 1.0  PROT 7.9 7.1  ALBUMIN 3.6 3.3*   No results for input(s): LIPASE, AMYLASE in the last 168 hours. No results for input(s): AMMONIA in the last 168 hours. Coagulation Profile: Recent Labs  Lab 06/09/21 1616  INR 1.1   Cardiac Enzymes: No results for input(s): CKTOTAL, CKMB, CKMBINDEX, TROPONINI in the last 168 hours. BNP (last 3 results) No results for input(s): PROBNP in the last 8760 hours. HbA1C: No results for input(s): HGBA1C in the last 72 hours. CBG: No results for input(s): GLUCAP in the last 168 hours. Lipid Profile: No results for input(s): CHOL, HDL, LDLCALC, TRIG, CHOLHDL, LDLDIRECT in the last 72 hours. Thyroid Function Tests: No results for input(s): TSH, T4TOTAL, FREET4, T3FREE, THYROIDAB in the last 72 hours. Anemia Panel: No results for input(s): VITAMINB12, FOLATE, FERRITIN, TIBC, IRON, RETICCTPCT in the last 72 hours. Sepsis Labs: Recent Labs  Lab  06/09/21 1616 06/09/21 1726 06/10/21 0000 06/10/21 0831  PROCALCITON  --   --  <0.10 <0.10  LATICACIDVEN 1.4 1.3  --   --     Recent Results (from the past 240 hour(s))  Resp Panel by RT-PCR (Flu A&B, Covid) Nasopharyngeal Swab     Status: None   Collection Time: 06/09/21  3:26 PM   Specimen: Nasopharyngeal Swab; Nasopharyngeal(NP) swabs in vial transport medium  Result Value Ref Range Status   SARS Coronavirus 2 by RT PCR NEGATIVE NEGATIVE Final    Comment: (NOTE) SARS-CoV-2 target nucleic acids are NOT DETECTED.  The SARS-CoV-2 RNA is generally detectable in upper respiratory specimens during the acute phase of infection. The lowest concentration of SARS-CoV-2 viral copies this assay can detect is 138 copies/mL. A negative result does not preclude  SARS-Cov-2 infection and should not be used as the sole basis for treatment or other patient management decisions. A negative result may occur with  improper specimen collection/handling, submission of specimen other than nasopharyngeal swab, presence of viral mutation(s) within the areas targeted by this assay, and inadequate number of viral copies(<138 copies/mL). A negative result must be combined with clinical observations, patient history, and epidemiological information. The expected result is Negative.  Fact Sheet for Patients:  EntrepreneurPulse.com.au  Fact Sheet for Healthcare Providers:  IncredibleEmployment.be  This test is no t yet approved or cleared by the [place] FDA and  has been authorized for detection and/or diagnosis of SARS-CoV-2 by FDA under an Emergency Use Authorization (EUA). This EUA will remain  in effect (meaning this test can be used) for the duration of the COVID-19 declaration under Section 564(b)(1) of the Act, 21 U.S.C.section 360bbb-3(b)(1), unless the authorization is terminated  or revoked sooner.       Influenza A by PCR NEGATIVE NEGATIVE Final   Influenza B by PCR NEGATIVE NEGATIVE Final    Comment: (NOTE) The Xpert Xpress SARS-CoV-2/FLU/RSV plus assay is intended as an aid in the diagnosis of influenza from Nasopharyngeal swab specimens and should not be used as a sole basis for treatment. Nasal washings and aspirates are unacceptable for Xpert Xpress SARS-CoV-2/FLU/RSV testing.  Fact Sheet for Patients: EntrepreneurPulse.com.au  Fact Sheet for Healthcare Providers: IncredibleEmployment.be  This test is not yet approved or cleared by the [place] FDA and has been authorized for detection and/or diagnosis of SARS-CoV-2 by FDA under an Emergency Use Authorization (EUA). This EUA will remain in effect (meaning this test can be used) for the duration of  the COVID-19 declaration under Section 564(b)(1) of the Act, 21 U.S.C. section 360bbb-3(b)(1), unless the authorization is terminated or revoked.  Performed at [facility], [place] [address], [place], [place] [address]   Culture, blood (Routine x 2)     Status: None   Collection Time: 06/09/21  4:16 PM   Specimen: BLOOD  Result Value Ref Range Status   Specimen Description   Final    BLOOD RIGHT ANTECUBITAL Performed at [facility] [address] [place], [place] [address]    Special Requests   Final    BOTTLES DRAWN AEROBIC AND ANAEROBIC Blood Culture results may not be optimal due to an excessive volume of blood received in culture bottles Performed at [facility] [address] [place], [place] [address]    Culture   Final    NO GROWTH 5 DAYS Performed at [facility], [place] [address] [place], [place] [address]    Report Status  06/14/2021 FINAL  Final  Culture, blood (Routine x 2)     Status: None   Collection Time: 06/09/21  4:16 PM   Specimen: BLOOD  Result Value Ref Range Status   Specimen Description   Final    BLOOD BLOOD RIGHT FOREARM Performed at [facility] [address] [place], [place] [address]    Special Requests   Final    BOTTLES DRAWN AEROBIC AND ANAEROBIC Blood Culture results may not be optimal due to an excessive volume of blood received in culture bottles Performed at [facility] [address], [place], [place] [address]    Culture   Final    NO GROWTH 5 DAYS Performed at [facility], [place] [address] [place], [place] [address]    Report Status 06/14/2021 FINAL  Final  Expectorated Sputum Assessment w Gram Stain, Rflx to Resp Cult     Status: None   Collection Time: 06/10/21  3:01 PM   Specimen: Sputum  Result Value Ref Range Status   Specimen Description SPUTUM  Final   Special Requests NONE  Final   Sputum evaluation   Final     THIS SPECIMEN IS ACCEPTABLE FOR SPUTUM CULTURE Performed at [facility], [place] [address], [place], [place] [address]    Report Status 06/10/2021 FINAL  Final  Culture, Respiratory w Gram Stain     Status: None   Collection Time: 06/10/21  3:01 PM   Specimen: SPU  Result Value Ref Range Status   Specimen Description   Final    SPUTUM Performed at [facility] [address] [place], [place] [address]    Special Requests   Final    NONE Reflexed from [id number] Performed at [facility], [place] [address], [place], [place] [address]    Gram Stain   Final    MODERATE WBC PRESENT,BOTH PMN AND MONONUCLEAR FEW GRAM POSITIVE COCCI IN PAIRS RARE BUDDING YEAST SEEN    Culture   Final    RARE Normal respiratory flora-no Staph aureus or Pseudomonas seen Performed at [facility], [address] [place],  [address]    Report Status 06/12/2021 FINAL  Final         Radiology Studies: No results found.      Scheduled Meds:  apixaban  10 mg Oral BID   Followed by   Derrill Memo ON 06/17/2021] apixaban  5 mg Oral BID   arformoterol  15 mcg Nebulization BID   budesonide (PULMICORT) nebulizer solution  0.5 mg Nebulization Q12H   chlorhexidine  15 mL Mouth Rinse BID   guaiFENesin  600 mg Oral BID   mouth rinse  15 mL Mouth Rinse q12n4p   polyethylene glycol  17 g Oral Daily   predniSONE  20 mg Oral Q breakfast   revefenacin  175 mcg Nebulization Daily   rOPINIRole  2 mg Oral QHS   sodium chloride HYPERTONIC  4 mL Nebulization BID   tamsulosin  0.4 mg Oral Daily   Continuous Infusions:  piperacillin-tazobactam (ZOSYN)  IV 3.375 g (06/14/21 2102)     LOS: 5 days    Time spent: 98mins    Kathie Dike, MD [facility]   If 7PM-7AM, please contact night-coverage [email or web address]  06/14/2021, 9:24 PM

## 2021-06-14 NOTE — Progress Notes (Signed)
Physical Therapy Treatment Patient Details Name: Jeffrey Hines MRN: [id number] DOB: 1946-04-12 Today's Date: 06/14/2021   History of Present Illness Pt is 76 yo male admitted on 06/09/20 with resp failure due to bronchiectasis exacerbation and possible PE.  Pt with hx of bronchiectasis, COPD, lung CA with lobe resection.    PT Comments    Pt making good progress.  Able to ambulate 400' slowly with 1 standing rest break.  Does need 6 L O2 for activity with cues to focus on breathing in nose and focus on symptoms rather than watching pulse ox continuously.  Continue to progress as able.     Recommendations for follow up therapy are one component of a multi-disciplinary discharge planning process, led by the attending physician.  Recommendations may be updated based on patient status, additional functional criteria and insurance authorization.  Follow Up Recommendations  Home health PT     Assistance Recommended at Discharge Intermittent Supervision/Assistance  Patient can return home with the following Assistance with cooking/housework   Equipment Recommendations  Rolling walker (2 wheels)    Recommendations for Other Services       Precautions / Restrictions Precautions Precautions: Other (comment) Precaution Comments: watch O2 and HR Restrictions Other Position/Activity Restrictions: per pulmonolgy note maintain O2 beween 88% and 92%     Mobility  Bed Mobility               General bed mobility comments: was up in recliner    Transfers Overall transfer level: Needs assistance Equipment used: Rolling walker (2 wheels) Transfers: Sit to/from Stand Sit to Stand: Supervision           General transfer comment: supervision for safety    Ambulation/Gait Ambulation/Gait assistance: Min guard Gait Distance (Feet): 400 Feet Assistive device: Rolling walker (2 wheels) Gait Pattern/deviations: Step-through pattern;Decreased stride length Gait velocity:  decreased     General Gait Details: 400' with 1 standing rest break; cued to breath in through nose; pt watching pulse ox continuously and worries when drops to 88% briefly; cued to look upright, focus on breathing and symptoms   Stairs             Wheelchair Mobility    Modified Rankin (Stroke Patients Only)       Balance Overall balance assessment: Needs assistance Sitting-balance support: No upper extremity supported Sitting balance-Leahy Scale: Good     Standing balance support: No upper extremity supported;During functional activity Standing balance-Leahy Scale: Fair Standing balance comment: static standing no UE support, needs UE support with movement                            Cognition Arousal/Alertness: Awake/alert Behavior During Therapy: WFL for tasks assessed/performed Overall Cognitive Status: Within Functional Limits for tasks assessed                                 General Comments: .        Exercises      General Comments General comments (skin integrity, edema, etc.): Pt on 5 L O2 HFNC with sats 91-92% rest; ambulated on 6 L with sats 88-91%; once resting and recovered able to wean back to 5 L with sats 93%      Pertinent Vitals/Pain Pain Assessment: No/denies pain    Home Living  Prior Function            PT Goals (current goals can now be found in the care plan section) Progress towards PT goals: Progressing toward goals    Frequency    Min 3X/week      PT Plan Current plan remains appropriate    Co-evaluation              AM-PAC PT "6 Clicks" Mobility   Outcome Measure  Help needed turning from your back to your side while in a flat bed without using bedrails?: None Help needed moving from lying on your back to sitting on the side of a flat bed without using bedrails?: None Help needed moving to and from a bed to a chair (including a wheelchair)?: A  Little Help needed standing up from a chair using your arms (e.g., wheelchair or bedside chair)?: A Little Help needed to walk in hospital room?: A Little Help needed climbing 3-5 steps with a railing? : A Little 6 Click Score: 20    End of Session Equipment Utilized During Treatment: Gait belt;Oxygen Activity Tolerance: Patient tolerated treatment well Patient left: with chair alarm set;in chair;with call bell/phone within reach Nurse Communication: Mobility status PT Visit Diagnosis: Other abnormalities of gait and mobility (R26.89)     Time: 5449-2010 PT Time Calculation (min) (ACUTE ONLY): 20 min  Charges:  $Gait Training: 8-22 mins                     Abran Richard, PT Acute Rehab Services Pager [phone] [facility] [place] 06/14/2021, 5:57 PM

## 2021-06-15 ENCOUNTER — Inpatient Hospital Stay: Payer: Self-pay

## 2021-06-15 ENCOUNTER — Other Ambulatory Visit: Payer: Self-pay | Admitting: Medical Oncology

## 2021-06-15 ENCOUNTER — Ambulatory Visit: Payer: Medicare Other | Admitting: Internal Medicine

## 2021-06-15 ENCOUNTER — Telehealth: Payer: Self-pay | Admitting: Medical Oncology

## 2021-06-15 DIAGNOSIS — G9341 Metabolic encephalopathy: Secondary | ICD-10-CM | POA: Diagnosis not present

## 2021-06-15 DIAGNOSIS — J189 Pneumonia, unspecified organism: Secondary | ICD-10-CM | POA: Diagnosis not present

## 2021-06-15 DIAGNOSIS — J9621 Acute and chronic respiratory failure with hypoxia: Secondary | ICD-10-CM | POA: Diagnosis not present

## 2021-06-15 DIAGNOSIS — J441 Chronic obstructive pulmonary disease with (acute) exacerbation: Secondary | ICD-10-CM | POA: Diagnosis not present

## 2021-06-15 DIAGNOSIS — J471 Bronchiectasis with (acute) exacerbation: Secondary | ICD-10-CM | POA: Diagnosis not present

## 2021-06-15 DIAGNOSIS — Z86711 Personal history of pulmonary embolism: Secondary | ICD-10-CM | POA: Diagnosis present

## 2021-06-15 DIAGNOSIS — I2699 Other pulmonary embolism without acute cor pulmonale: Secondary | ICD-10-CM | POA: Diagnosis present

## 2021-06-15 LAB — BASIC METABOLIC PANEL
Anion gap: 8 (ref 5–15)
BUN: 26 mg/dL — ABNORMAL HIGH (ref 8–23)
CO2: 34 mmol/L — ABNORMAL HIGH (ref 22–32)
Calcium: 8.8 mg/dL — ABNORMAL LOW (ref 8.9–10.3)
Chloride: 99 mmol/L (ref 98–111)
Creatinine, Ser: 0.82 mg/dL (ref 0.61–1.24)
GFR, Estimated: >60 mL/min (ref >60)
Glucose, Bld: 99 mg/dL (ref 70–99)
Potassium: 4.6 mmol/L (ref 3.5–5.1)
Sodium: 141 mmol/L (ref 135–145)

## 2021-06-15 LAB — LEGIONELLA PNEUMOPHILA SEROGP 1 UR AG: L. pneumophila Serogp 1 Ur Ag: NEGATIVE

## 2021-06-15 NOTE — Telephone Encounter (Signed)
Wife notified that appt today is cancelled and will be r/s for 1-2 weeks. Schedule message sent./

## 2021-06-15 NOTE — Care Management Important Message (Signed)
Important Message  Patient Details IM Letter given to the Patient. Name: Jeffrey Hines MRN: [id number] Date of Birth: [birth year]   Medicare Important Message Given:  Yes     Kerin Salen 06/15/2021, 11:03 AM

## 2021-06-15 NOTE — Progress Notes (Signed)
PHARMACY CONSULT NOTE FOR:  OUTPATIENT  PARENTERAL ANTIBIOTIC THERAPY (OPAT)  Indication: PNA Regimen: Zosyn 3.375g IV every 8 hours End date: 06/23/21  IV antibiotic discharge orders are pended. To discharging provider:  please sign these orders via discharge navigator,  Select New Orders & click on the button choice - Manage This Unsigned Work.    Thank you for allowing pharmacy to be a part of this patients care.  Alycia Rossetti, PharmD, BCPS Clinical Pharmacist 06/15/2021 2:59 PM   **Pharmacist phone directory can now be found on [email or web address] ([email or web address]).  Listed under [place].

## 2021-06-15 NOTE — Progress Notes (Signed)
Physical Therapy Treatment Patient Details Name: Jeffrey Hines MRN: [id number] DOB: 30-Oct-1945 Today's Date: 06/15/2021   History of Present Illness Pt is 76 yo male admitted on 06/09/20 with resp failure due to bronchiectasis exacerbation and possible PE.  Pt with hx of bronchiectasis, COPD, lung CA with lobe resection.    PT Comments    Pt is progressing with mobility. Pt is tolerating ambulation in the halls with RW and supervision. Pt requires cues to stay on track and focus on breathing and looking forward and not so much on the pulse ox. Pt's O2 sats 88-92 on 6L O2 with gait. Pt will continue to benefit from acute skilled PT to maximize mobility for d/c home with spouse.    Recommendations for follow up therapy are one component of a multi-disciplinary discharge planning process, led by the attending physician.  Recommendations may be updated based on patient status, additional functional criteria and insurance authorization.  Follow Up Recommendations  Home health PT     Assistance Recommended at Discharge Intermittent Supervision/Assistance  Patient can return home with the following Help with stairs or ramp for entrance;A little help with walking and/or transfers   Equipment Recommendations  Rolling walker (2 wheels)    Recommendations for Other Services       Precautions / Restrictions Precautions Precaution Comments: watch O2 and HR Restrictions Weight Bearing Restrictions: No Other Position/Activity Restrictions: per pulmonolgy note maintain O2 beween 88% and 92%     Mobility  Bed Mobility               General bed mobility comments: was up in recliner Patient Response: Anxious  Transfers   Equipment used: Rolling walker (2 wheels) Transfers: Sit to/from Stand Sit to Stand: Min guard           General transfer comment: cues for hand placement on RW and also completely backing up prior to sitting.    Ambulation/Gait Ambulation/Gait  assistance: Min guard Gait Distance (Feet): 400 Feet Assistive device: Rolling walker (2 wheels) Gait Pattern/deviations: Step-through pattern;Decreased stride length Gait velocity: decreased     General Gait Details: 400' with 1 standing rest break; cued to breath in through nose; pt watching pulse ox continuously and worries when drops to 88% briefly; cued to look upright, focus on breathing and symptoms   Stairs             Wheelchair Mobility    Modified Rankin (Stroke Patients Only)       Balance Overall balance assessment: Needs assistance Sitting-balance support: No upper extremity supported Sitting balance-Leahy Scale: Good     Standing balance support: During functional activity;Bilateral upper extremity supported Standing balance-Leahy Scale: Fair                              Cognition Arousal/Alertness: Awake/alert Behavior During Therapy: WFL for tasks assessed/performed Overall Cognitive Status: Within Functional Limits for tasks assessed                                          Exercises      General Comments General comments (skin integrity, edema, etc.): spent a lengthy time recovering from ambulation from the BR prior to walking in the hall. Pt's spouse was present and reported he will probably d/c home today.      Pertinent Vitals/Pain Pain  Assessment: No/denies pain    Home Living                          Prior Function            PT Goals (current goals can now be found in the care plan section) Progress towards PT goals: Progressing toward goals    Frequency    Min 3X/week      PT Plan Current plan remains appropriate    Co-evaluation              AM-PAC PT "6 Clicks" Mobility   Outcome Measure  Help needed turning from your back to your side while in a flat bed without using bedrails?: None Help needed moving from lying on your back to sitting on the side of a flat bed  without using bedrails?: None Help needed moving to and from a bed to a chair (including a wheelchair)?: A Little Help needed standing up from a chair using your arms (e.g., wheelchair or bedside chair)?: A Little Help needed to walk in hospital room?: A Little Help needed climbing 3-5 steps with a railing? : A Lot 6 Click Score: 19    End of Session Equipment Utilized During Treatment: Gait belt;Oxygen Activity Tolerance: Patient tolerated treatment well Patient left: with chair alarm set;in chair;with call bell/phone within reach Nurse Communication: Mobility status PT Visit Diagnosis: Other abnormalities of gait and mobility (R26.89)     Time: 5409-8119 PT Time Calculation (min) (ACUTE ONLY): 31 min  Charges:  $Gait Training: 8-22 mins $Therapeutic Activity: 8-22 mins                       Satvik Parco Kerstine 06/15/2021, 1:15 PM

## 2021-06-15 NOTE — Telephone Encounter (Signed)
Requests refill for Zofran ODT.

## 2021-06-15 NOTE — Progress Notes (Signed)
Patient ambulated around the unit x1 with 1 standing rest break, Pt on 6 L O2 while ambulating and O2 level flactuates between 90-94%.

## 2021-06-15 NOTE — Progress Notes (Addendum)
PROGRESS NOTE    Jeffrey Hines  GEZ:[id number] DOB: 10/04/1945 DOA: 06/09/2021 PCP: [facility]    Brief Narrative:  76 year old male with a history of steroid-dependent/oxygen dependent COPD, bronchiectasis, previous history of lung cancer status post left upper lobe resection, presents with progressive shortness of breath.  Thought to be related to COPD/pneumonia/bronchiectasis.  CT chest cannot rule out pulmonary embolus.  Currently on steroids, antibiotics, bronchodilators.  Also started on anticoagulation.  Clinically has improved.   Assessment & Plan:   Principal Problem:   Pneumonia Active Problems:   COPD with chronic bronchitis (HCC)   Hypertension   Anxiety state   Chronic respiratory failure with hypoxia (HCC)   Pressure injury of skin   Acute on chronic respiratory failure with hypoxia and hypercapnia -Secondary to pneumonia/COPD, possible element of PE -Seen by pulmonology, appreciate input -CT angiogram of the chest does not show any large central pulmonary embolus, but could not rule out RLL PE -He does show progressive consolidation of the left lower lobe -Currently appears to be comfortable on 5 L of oxygen while at rest, and has been able to ambulate with a walker. -Due to rising PCO2 levels and confusion/somnolence on 1/6, he was started on BiPAP -Overall mental status is better, recommendations from pulmonology were to continue BiPAP nightly while admitted -Chest percussion therapy with a vest was also recommended, patient reports that he has not been able to tolerate this in the past due to pain associated with previous rib fractures and requests that it be discontinued -Discussed with pulmonology, patient will not discharged with BiPAP at this time.  This will be readdressed on his follow-up in 2 weeks. -We will try the patient off BiPAP tonight and reassess mental status and respiratory status in a.m.  Metabolic encephalopathy -secondary to  hypercapnia -improved with bipap   Pneumonia/bronchiectasis -Previous history of Pseudomonas isolated from sputum -Repeat sputum culture in process.  Pneumococcal antigen negative.  Legionella antigen in process -He has been started on Zosyn since admission, and completed 5 days of azithromycin -Discussed with pulmonology, Dr. Silas Flood who feels patient would benefit from 14 days of zosyn (stop date 06/23/21) -Discussed with ID, Dr. Juleen China who felt this plan was reasonable -Placed order for PICC line  Acute pulmonary embolus -With ongoing tachycardia and hypoxia, patient was started on apixaban   COPD exacerbation -Chronically on prednisone 20 mg daily -He received a dose of IV steroids and is continued on prednisone 40 mg daily -Completed prednisone 40 mg for 5 days and now has returned to baseline dose of 20 mg daily -Continue bronchodilators   Anxiety -Continue home dose of Klonopin, but changed to prn   Restless leg syndrome -Continue on Requip   Prior history of lung cancer, stage II, s/p resection of left upper lobe -CT scan of the chest done today shows interval increase in the size of the right middle lobe/perihilar nodule concerning for neoplastic process -We will defer further work-up to pulmonology.   Goals of care -Both myself and pulmonology have discussed goals of care with patient and his wife.  At this point, he wishes to continue full measures. -Ultimately, if he ends up on the ventilator, he would defer continuing these measures to his wife   DVT prophylaxis:  apixaban (ELIQUIS) tablet 10 mg  apixaban (ELIQUIS) tablet 5 mg  Code Status: Full code Family Communication: Discussed with wife at the bedside Disposition Plan: Status is: inpatient  Remains inpatient appropriate because: Continued management of respiratory failure  due to pneumonia         Consultants:  Pulmonology  Procedures:    Antimicrobials:  Zosyn 1/4 > Azithromycin 1/4 >  1/9   Subjective: He is unsure whether he wore BiPAP last night.  Staff reports that he did not wear it for a large portion of the night, but at some point had woken up overnight and asked to be placed on it.  Patient does not have any memory of this.  Objective: Vitals:   06/15/21 0730 06/15/21 1308 06/15/21 1344 06/15/21 1952  BP:   107/72   Pulse:   (!) 101   Resp:   18   Temp:   98.2 F (36.8 C)   TempSrc:   Oral   SpO2: 97% 92% 94% 95%  Weight:      Height:        Intake/Output Summary (Last 24 hours) at 06/15/2021 2123 Last data filed at 06/15/2021 1510 Gross per 24 hour  Intake 298.88 ml  Output 500 ml  Net -201.12 ml   Filed Weights   06/09/21 1405  Weight: 70.1 kg    Examination:  General exam: Alert, awake, oriented x 3 Respiratory system: Diminished breath sounds on left. Respiratory effort normal. Cardiovascular system:RRR. No murmurs, rubs, gallops. Gastrointestinal system: Abdomen is nondistended, soft and nontender. No organomegaly or masses felt. Normal bowel sounds heard. Central nervous system: Alert and oriented. No focal neurological deficits. Extremities: No C/C/E, +pedal pulses Skin: No rashes, lesions or ulcers Psychiatry: Judgement and insight appear normal. Mood & affect appropriate.    Data Reviewed: I have personally reviewed following labs and imaging studies  CBC: Recent Labs  Lab 06/09/21 1616 06/10/21 0000 06/10/21 0831 06/11/21 0352 06/12/21 0337  WBC 10.2 11.6* 12.8* 14.5* 13.5*  HGB 15.4 13.9 13.8 13.4 12.8*  HCT 51.3 45.9 45.4 45.6 44.0  MCV 90.6 89.6 89.5 91.6 92.6  PLT 250 251 260 229 458   Basic Metabolic Panel: Recent Labs  Lab 06/10/21 0831 06/11/21 0352 06/12/21 0337 06/13/21 0328 06/15/21 0327  NA 138 142 139 140 141  K 4.2 4.9 4.2 4.1 4.6  CL 92* 92* 91* 95* 99  CO2 36* 41* 41* 37* 34*  GLUCOSE 168* 169* 137* 127* 99  BUN 26* 23 27* 30* 26*  CREATININE 0.80 0.66 1.05 0.94 0.82  CALCIUM 9.0 8.7* 8.6*  8.5* 8.8*   GFR: Estimated Creatinine Clearance: 78.4 mL/min (by C-G formula based on SCr of 0.82 mg/dL). Liver Function Tests: Recent Labs  Lab 06/09/21 1616 06/10/21 0831  AST 17 15  ALT 13 11  ALKPHOS 61 55  BILITOT 0.9 1.0  PROT 7.9 7.1  ALBUMIN 3.6 3.3*   No results for input(s): LIPASE, AMYLASE in the last 168 hours. No results for input(s): AMMONIA in the last 168 hours. Coagulation Profile: Recent Labs  Lab 06/09/21 1616  INR 1.1   Cardiac Enzymes: No results for input(s): CKTOTAL, CKMB, CKMBINDEX, TROPONINI in the last 168 hours. BNP (last 3 results) No results for input(s): PROBNP in the last 8760 hours. HbA1C: No results for input(s): HGBA1C in the last 72 hours. CBG: No results for input(s): GLUCAP in the last 168 hours. Lipid Profile: No results for input(s): CHOL, HDL, LDLCALC, TRIG, CHOLHDL, LDLDIRECT in the last 72 hours. Thyroid Function Tests: No results for input(s): TSH, T4TOTAL, FREET4, T3FREE, THYROIDAB in the last 72 hours. Anemia Panel: No results for input(s): VITAMINB12, FOLATE, FERRITIN, TIBC, IRON, RETICCTPCT in the last 72 hours. Sepsis Labs:  Recent Labs  Lab 06/09/21 1616 06/09/21 1726 06/10/21 0000 06/10/21 0831  PROCALCITON  --   --  <0.10 <0.10  LATICACIDVEN 1.4 1.3  --   --     Recent Results (from the past 240 hour(s))  Resp Panel by RT-PCR (Flu A&B, Covid) Nasopharyngeal Swab     Status: None   Collection Time: 06/09/21  3:26 PM   Specimen: Nasopharyngeal Swab; Nasopharyngeal(NP) swabs in vial transport medium  Result Value Ref Range Status   SARS Coronavirus 2 by RT PCR NEGATIVE NEGATIVE Final    Comment: (NOTE) SARS-CoV-2 target nucleic acids are NOT DETECTED.  The SARS-CoV-2 RNA is generally detectable in upper respiratory specimens during the acute phase of infection. The lowest concentration of SARS-CoV-2 viral copies this assay can detect is 138 copies/mL. A negative result does not preclude SARS-Cov-2 infection  and should not be used as the sole basis for treatment or other patient management decisions. A negative result may occur with  improper specimen collection/handling, submission of specimen other than nasopharyngeal swab, presence of viral mutation(s) within the areas targeted by this assay, and inadequate number of viral copies(<138 copies/mL). A negative result must be combined with clinical observations, patient history, and epidemiological information. The expected result is Negative.  Fact Sheet for Patients:  EntrepreneurPulse.com.au  Fact Sheet for Healthcare Providers:  IncredibleEmployment.be  This test is no t yet approved or cleared by the [place] FDA and  has been authorized for detection and/or diagnosis of SARS-CoV-2 by FDA under an Emergency Use Authorization (EUA). This EUA will remain  in effect (meaning this test can be used) for the duration of the COVID-19 declaration under Section 564(b)(1) of the Act, 21 U.S.C.section 360bbb-3(b)(1), unless the authorization is terminated  or revoked sooner.       Influenza A by PCR NEGATIVE NEGATIVE Final   Influenza B by PCR NEGATIVE NEGATIVE Final    Comment: (NOTE) The Xpert Xpress SARS-CoV-2/FLU/RSV plus assay is intended as an aid in the diagnosis of influenza from Nasopharyngeal swab specimens and should not be used as a sole basis for treatment. Nasal washings and aspirates are unacceptable for Xpert Xpress SARS-CoV-2/FLU/RSV testing.  Fact Sheet for Patients: EntrepreneurPulse.com.au  Fact Sheet for Healthcare Providers: IncredibleEmployment.be  This test is not yet approved or cleared by the [place] FDA and has been authorized for detection and/or diagnosis of SARS-CoV-2 by FDA under an Emergency Use Authorization (EUA). This EUA will remain in effect (meaning this test can be used) for the duration of the COVID-19 declaration  under Section 564(b)(1) of the Act, 21 U.S.C. section 360bbb-3(b)(1), unless the authorization is terminated or revoked.  Performed at [facility], [place] [address], [place], [place] [address]   Culture, blood (Routine x 2)     Status: None   Collection Time: 06/09/21  4:16 PM   Specimen: BLOOD  Result Value Ref Range Status   Specimen Description   Final    BLOOD RIGHT ANTECUBITAL Performed at [facility] [address] [place], [place] [address]    Special Requests   Final    BOTTLES DRAWN AEROBIC AND ANAEROBIC Blood Culture results may not be optimal due to an excessive volume of blood received in culture bottles Performed at [facility] [address], [place], [place] [address]    Culture   Final    NO GROWTH 5 DAYS Performed at [facility], [place] [address], [place], [place] [address]  Report Status 06/14/2021 FINAL  Final  Culture, blood (Routine x 2)     Status: None   Collection Time: 06/09/21  4:16 PM   Specimen: BLOOD  Result Value Ref Range Status   Specimen Description   Final    BLOOD BLOOD RIGHT FOREARM Performed at [facility] [address] [place], [place] [address]    Special Requests   Final    BOTTLES DRAWN AEROBIC AND ANAEROBIC Blood Culture results may not be optimal due to an excessive volume of blood received in culture bottles Performed at [facility] [address], [place], [place] [address]    Culture   Final    NO GROWTH 5 DAYS Performed at [facility], [place] [address] [place], [place] [address]    Report Status 06/14/2021 FINAL  Final  Expectorated Sputum Assessment w Gram Stain, Rflx to Resp Cult     Status: None   Collection Time: 06/10/21  3:01 PM   Specimen: Sputum  Result Value Ref Range Status   Specimen Description SPUTUM  Final   Special Requests NONE  Final   Sputum evaluation   Final    THIS SPECIMEN IS  ACCEPTABLE FOR SPUTUM CULTURE Performed at [facility], [place] [address] [place], [place] [address]    Report Status 06/10/2021 FINAL  Final  Culture, Respiratory w Gram Stain     Status: None   Collection Time: 06/10/21  3:01 PM   Specimen: SPU  Result Value Ref Range Status   Specimen Description   Final    SPUTUM Performed at [facility] [address] [place], [place] [address]    Special Requests   Final    NONE Reflexed from [phone] [phone] Performed at [facility], [place] [address] [place], [place] [address]    Gram Stain   Final    MODERATE WBC PRESENT,BOTH PMN AND MONONUCLEAR FEW GRAM POSITIVE COCCI IN PAIRS RARE BUDDING YEAST SEEN    Culture   Final    RARE Normal respiratory flora-no Staph aureus or Pseudomonas seen Performed at [facility], [address] [place], [place] [address]    Report Status 06/12/2021 FINAL  Final         Radiology Studies: [name] EKG SITE RITE  Result Date: 06/15/2021 If Site Rite image not attached, placement could not be confirmed due to current cardiac rhythm.       Scheduled Meds:  apixaban  10 mg Oral BID   Followed by   Derrill Memo ON 06/17/2021] apixaban  5 mg Oral BID   arformoterol  15 mcg Nebulization BID   budesonide (PULMICORT) nebulizer solution  0.5 mg Nebulization Q12H   chlorhexidine  15 mL Mouth Rinse BID   guaiFENesin  600 mg Oral BID   mouth rinse  15 mL Mouth Rinse q12n4p   polyethylene glycol  17 g Oral Daily   predniSONE  20 mg Oral Q breakfast   revefenacin  175 mcg Nebulization Daily   rOPINIRole  2 mg Oral QHS   sodium chloride HYPERTONIC  4 mL Nebulization BID   tamsulosin  0.4 mg Oral Daily   Continuous Infusions:  piperacillin-tazobactam (ZOSYN)  IV 3.375 g (06/15/21 2119)     LOS: 6 days    Time spent: 39mins    Kathie Dike, MD [facility]   If 7PM-7AM, please contact night-coverage [email or web address]  06/15/2021, 9:23 PM

## 2021-06-15 NOTE — TOC Progression Note (Addendum)
Transition of Care [facility]) - Progression Note    Patient Details  Name: Jaxin Fulfer MRN: [id number] Date of Birth: 01-Jan-1946  Transition of Care [facility]) CM/SW Contact  Leeroy Cha, RN Phone Number: 06/15/2021, 8:03 AM  Clinical Narrative:    Will investigate which company patient has home o2 through and get a new concentrator . Concentrator is through [employer] and was a one time purchase.  New concentrator ordered through [employer]. Hhc for iv abs Zosyn x 14 days sent to Carolynn Sayers with [employer]. Alvis Lemmings will do the hhrn. Expected Discharge Plan: Home/Self Care Barriers to Discharge: Continued Medical Work up  Expected Discharge Plan and Services Expected Discharge Plan: Home/Self Care   Discharge Planning Services: CM Consult   Living arrangements for the past 2 months: Single Family Home                                       Social Determinants of Health (SDOH) Interventions    Readmission Risk Interventions Readmission Risk Prevention Plan 10/10/2018  Post Dischage Appt Complete  Medication Screening Complete  Transportation Screening Complete  Some recent data might be hidden

## 2021-06-15 NOTE — Progress Notes (Signed)
Arrived to discuss bedside PICC placement including risks, benefits, and alternatives with patient and spouse. After discussion, patient refused bedside PICC placement. Macario Carls RN notified.

## 2021-06-16 DIAGNOSIS — J189 Pneumonia, unspecified organism: Secondary | ICD-10-CM | POA: Diagnosis not present

## 2021-06-16 MED ORDER — POLYETHYLENE GLYCOL 3350 17 G PO PACK: 17.0000 g | PACK | Freq: Every day | ORAL | 0 refills | Status: AC | PRN

## 2021-06-16 MED ORDER — SACCHAROMYCES BOULARDII 250 MG PO CAPS: 250.0000 mg | ORAL_CAPSULE | Freq: Two times a day (BID) | ORAL | Status: DC

## 2021-06-16 MED ORDER — SACCHAROMYCES BOULARDII 250 MG PO CAPS: 250.0000 mg | ORAL_CAPSULE | Freq: Two times a day (BID) | ORAL | 0 refills | Status: AC

## 2021-06-16 MED ORDER — APIXABAN 5 MG PO TABS: 5.0000 mg | ORAL_TABLET | Freq: Two times a day (BID) | ORAL | 0 refills | Status: AC

## 2021-06-16 MED ORDER — LEVOFLOXACIN 750 MG PO TABS
750.0000 mg | ORAL_TABLET | Freq: Every day | ORAL | 0 refills | Status: AC
Start: 2021-06-16 — End: 2021-06-23

## 2021-06-16 NOTE — TOC Transition Note (Addendum)
Transition of Care [facility]) - CM/SW Discharge Note   Patient Details  Name: Maddux Vanscyoc MRN: [id number] Date of Birth: 24-Feb-1946  Transition of Care [facility]) CM/SW Contact:  Leeroy Cha, RN Phone Number: 06/16/2021, 12:34 PM   Clinical Narrative:    Patient dcd to go home .  Carolynn Sayers notified.  Getting o2 sats for home o2 through [employer]. Patient going home on po abx not iv abx.  Alvis Lemmings will see do pt and ot.    Barriers to Discharge: Continued Medical Work up   Patient Goals and CMS Choice Patient states their goals for this hospitalization and ongoing recovery are:: to go back home and feel better CMS Medicare.gov Compare Post Acute Care list provided to:: Patient    Discharge Placement                       Discharge Plan and Services   Discharge Planning Services: CM Consult                                 Social Determinants of Health (SDOH) Interventions     Readmission Risk Interventions Readmission Risk Prevention Plan 10/10/2018  Post Dischage Appt Complete  Medication Screening Complete  Transportation Screening Complete  Some recent data might be hidden

## 2021-06-16 NOTE — Plan of Care (Signed)
°  Problem: Education: Goal: Knowledge of disease or condition will improve Outcome: Adequate for Discharge Goal: Knowledge of the prescribed therapeutic regimen will improve Outcome: Adequate for Discharge Goal: Individualized Educational Video(s) Outcome: Adequate for Discharge   Problem: Activity: Goal: Ability to tolerate increased activity will improve Outcome: Adequate for Discharge Goal: Will verbalize the importance of balancing activity with adequate rest periods Outcome: Adequate for Discharge   Problem: Respiratory: Goal: Ability to maintain a clear airway will improve Outcome: Adequate for Discharge Goal: Levels of oxygenation will improve Outcome: Adequate for Discharge Goal: Ability to maintain adequate ventilation will improve Outcome: Adequate for Discharge   Problem: Activity: Goal: Ability to tolerate increased activity will improve Outcome: Adequate for Discharge   Problem: Clinical Measurements: Goal: Ability to maintain a body temperature in the normal range will improve Outcome: Adequate for Discharge   Problem: Respiratory: Goal: Ability to maintain adequate ventilation will improve Outcome: Adequate for Discharge Goal: Ability to maintain a clear airway will improve Outcome: Adequate for Discharge   Problem: Education: Goal: Knowledge of General Education information will improve Description: Including pain rating scale, medication(s)/side effects and non-pharmacologic comfort measures Outcome: Adequate for Discharge   Problem: Health Behavior/Discharge Planning: Goal: Ability to manage health-related needs will improve Outcome: Adequate for Discharge   Problem: Clinical Measurements: Goal: Ability to maintain clinical measurements within normal limits will improve Outcome: Adequate for Discharge Goal: Will remain free from infection Outcome: Adequate for Discharge Goal: Diagnostic test results will improve Outcome: Adequate for  Discharge Goal: Respiratory complications will improve Outcome: Adequate for Discharge Goal: Cardiovascular complication will be avoided Outcome: Adequate for Discharge   Problem: Activity: Goal: Risk for activity intolerance will decrease Outcome: Adequate for Discharge   Problem: Nutrition: Goal: Adequate nutrition will be maintained Outcome: Adequate for Discharge   Problem: Coping: Goal: Level of anxiety will decrease Outcome: Adequate for Discharge   Problem: Elimination: Goal: Will not experience complications related to bowel motility Outcome: Adequate for Discharge Goal: Will not experience complications related to urinary retention Outcome: Adequate for Discharge   Problem: Pain Managment: Goal: General experience of comfort will improve Outcome: Adequate for Discharge   Problem: Safety: Goal: Ability to remain free from injury will improve Outcome: Adequate for Discharge   Problem: Skin Integrity: Goal: Risk for impaired skin integrity will decrease Outcome: Adequate for Discharge

## 2021-06-16 NOTE — Progress Notes (Signed)
SATURATION QUALIFICATIONS: (This note is used to comply with regulatory documentation for home oxygen)  Patient Saturations on Room Air at Rest = 82%  Patient Saturations on Room Air while Ambulating = 80%  Patient Saturations on 6 Liters of oxygen while Ambulating = 93%  Please briefly explain why patient needs home oxygen: Patient desaturates with exertion and on room air at rest.

## 2021-06-16 NOTE — Discharge Summary (Signed)
Discharge Summary  Jeffrey Hines IDP:[id number] DOB: April 18, 1946  PCP: [facility]  Admit date: 06/09/2021 Discharge date: 06/16/2021  Time spent: 35 minutes.  Recommendations for Outpatient Follow-up:  Follow-up with your pulmonologist Dr. Silas Flood within a week. Follow-up with your primary care provider. Take your medications as prescribed.  Discharge Diagnoses:  Active Hospital Problems   Diagnosis Date Noted   Pneumonia 04/23/2020   Acute pulmonary embolus ([place]) 06/15/2021   Acute on chronic respiratory failure with hypoxia and hypercapnia (HCC) 44/31/5400   Acute metabolic encephalopathy 86/76/1950   Pressure injury of skin 06/10/2021   Chronic respiratory failure with hypoxia ([place]) 08/19/2014   Anxiety state    COPD with chronic bronchitis ([place])    Hypertension     Resolved Hospital Problems  No resolved problems to display.    Discharge Condition: Stable  Diet recommendation: Resume previous diet.  Vitals:   06/16/21 0504 06/16/21 0825  BP: (!) 147/95   Pulse: 88   Resp: 18   Temp: 98.2 F (36.8 C)   SpO2: 96% 94%    History of present illness:  76 year old male with a history of steroid-dependent/oxygen dependent COPD, chronic hypoxia on 4 L nasal cannula at baseline, bronchiectasis, previous history of lung cancer status post left upper lobe resection, presents with progressive shortness of breath.  Thought to be related to COPD/pneumonia/bronchiectasis flare.  CT chest cannot rule out pulmonary embolus.  Was started on DOAC, Eliquis.  Currently on steroids, antibiotics, bronchodilators.  Clinically has improved.  06/16/2021: Patient was seen and examined at his bedside.  There were no acute events overnight.  The patient has declined PICC line placement and has declined to go home with IV antibiotics for his bronchiectasis flare.  He requested oral antibiotics.  Wants to go home today.  Discussed with infectious disease Dr. Juleen China,  recommended to complete total 2 weeks of antibiotics, to continue antibiotics with p.o. Levaquin 750 mg daily for DC planning.  Updated his wife at bedside, a former nurse, who is in full support of her husband's decision.    Hospital Course:  Principal Problem:   Pneumonia Active Problems:   COPD with chronic bronchitis (HCC)   Hypertension   Anxiety state   Chronic respiratory failure with hypoxia (HCC)   Pressure injury of skin   Acute pulmonary embolus (HCC)   Acute on chronic respiratory failure with hypoxia and hypercapnia (HCC)   Acute metabolic encephalopathy  Acute on chronic respiratory failure with hypoxia and hypercapnia secondary to bronchiectasis flare -Possible element of PE -Seen by pulmonology, appreciate input -CT angiogram of the chest does not show any large central pulmonary embolus, but could not rule out RLL PE -He does show progressive consolidation of the left lower lobe -Currently appears to be comfortable on 5 L of oxygen while at rest, and has been able to ambulate with a walker. -Due to rising PCO2 levels and confusion/somnolence on 1/6, he was started on BiPAP Mental status appears to be at his baseline.  Recommendations from pulmonology were to continue BiPAP nightly while admitted -Chest percussion therapy with a vest was also recommended, patient reports that he has not been able to tolerate this in the past due to pain associated with previous rib fractures and requests that it be discontinued -Dr. Roderic Palau discussed with pulmonology, patient will not discharge with BiPAP at this time.  This will be readdressed on his follow-up in 2 weeks. Follow-up with pulmonary Dr. Silas Flood within a week.   Resolved acute  metabolic encephalopathy likely secondary to hypercapnia -improved with bipap Hypercapnia will be readdressed at pulmonary's office Follow-up with your pulmonologist within a week.   Community-acquired pneumonia/bronchiectasis flare -Previous  history of Pseudomonas isolated from sputum -Repeat sputum culture unrevealing.  Pneumococcal antigen negative.  -He has been started on Zosyn since admission, and completed 5 days of azithromycin -Dr. Roderic Palau discussed with pulmonology, Dr. Silas Flood who feels patient would benefit from 14 days of zosyn (stop date 06/23/21) patient has declined PICC line placement and has declined to be discharged with IV antibiotics. -Discussed with ID, Dr. Juleen China who recommended 14 days total of antibiotics.  Patient request oral antibiotics, ID recommended Levaquin 750 mg daily, end date 06/23/2021.   Presumptive acute pulmonary embolus -With ongoing tachycardia and hypoxia, patient was started on apixaban Will treat for at least 3 months Follow-up with pulmonary.   COPD/bronchiectasis exacerbation -Chronically on prednisone 20 mg daily Continue home regimen for bronchiectasis. Follow-up with pulmonary within a week.   Chronic anxiety Continue home regimen. Follow-up with your PCP.   Restless leg syndrome -Continue on home Requip Follow-up with your PCP.   Prior history of lung cancer, stage II, s/p resection of left upper lobe -CT scan of the chest done today shows interval increase in the size of the right middle lobe/perihilar nodule concerning for neoplastic process -We will defer further work-up to pulmonology.   Goals of care Dr. Roderic Palau and pulmonology have discussed goals of care with patient and his wife.  At this point, he wishes to continue full measures. -Ultimately, if he ends up on the ventilator, he would defer continuing these measures to his wife     DVT prophylaxis:  apixaban (ELIQUIS) tablet 10 mg x7 days to end on 06/16/2021. apixaban (ELIQUIS) tablet 5 mg x3 months, at least. Code Status: Full code Family Communication: Discussed with his wife at bedside.        Consultants:  Pulmonology   Procedures:      Antimicrobials:  Zosyn 1/4 > Azithromycin 1/4 >  1/9 Levaquin started on 06/16/2021, to end on 06/23/2021.     Discharge Exam: BP (!) 147/95 (BP Location: Left Arm)    Pulse 88    Temp 98.2 F (36.8 C) (Oral)    Resp 18    Ht 5\' 11"  (1.803 m)    Wt 70.1 kg    SpO2 94%    BMI 21.56 kg/m  General: 76 y.o. year-old male well developed well nourished in no acute distress.  Alert and oriented x3. Cardiovascular: Regular rate and rhythm with no rubs or gallops.  No thyromegaly or JVD noted.   Respiratory: Diffuse rales bilaterally.  No wheezing noted.  Good inspiratory effort. Abdomen: Soft nontender nondistended with normal bowel sounds x4 quadrants. Musculoskeletal: No lower extremity edema. 2/4 pulses in all 4 extremities. Skin: No ulcerative lesions noted or rashes, Psychiatry: Mood is appropriate for condition and setting  Discharge Instructions You were cared for by a hospitalist during your hospital stay. If you have any questions about your discharge medications or the care you received while you were in the hospital after you are discharged, you can call the unit and asked to speak with the hospitalist on call if the hospitalist that took care of you is not available. Once you are discharged, your primary care physician will handle any further medical issues. Please note that NO REFILLS for any discharge medications will be authorized once you are discharged, as it is imperative that you return to  your primary care physician (or establish a relationship with a primary care physician if you do not have one) for your aftercare needs so that they can reassess your need for medications and monitor your lab values.   Allergies as of 06/16/2021       Reactions   Anoro Ellipta [umeclidinium-vilanterol] Hives   Prolixin [fluphenazine]    Severe back pain   Spiriva Respimat [tiotropium Bromide Monohydrate] Other (See Comments)   unknown   Advair Diskus [fluticasone-salmeterol] Anxiety        Medication List     STOP taking these  medications    amoxicillin-clavulanate 875-125 MG tablet Commonly known as: AUGMENTIN       TAKE these medications    acetaminophen 500 MG tablet Commonly known as: TYLENOL Take 1,000 mg by mouth every 8 (eight) hours as needed for moderate pain.   albuterol 108 (90 Base) MCG/ACT inhaler Commonly known as: VENTOLIN HFA Inhale 2 puffs into the lungs every 6 (six) hours as needed for wheezing or shortness of breath.   albuterol (2.5 MG/3ML) 0.083% nebulizer solution Commonly known as: PROVENTIL Take 3 mLs (2.5 mg total) by nebulization every 2 (two) hours as needed for wheezing or shortness of breath.   apixaban 5 MG Tabs tablet Commonly known as: ELIQUIS Take 1 tablet (5 mg total) by mouth 2 (two) times daily. Start taking on: June 17, 2021   azelastine 0.1 % nasal spray Commonly known as: ASTELIN Place 1 spray into the nose 2 (two) times daily as needed for rhinitis.   BL GLUCOSAMINE-CHONDROITIN PO Take 1 tablet by mouth daily.   CLARITIN-D 12 HOUR PO Take 1 tablet by mouth 2 (two) times daily.   clonazePAM 0.5 MG tablet Commonly known as: KLONOPIN Take 0.5 mg by mouth 2 (two) times daily.   fluticasone 50 MCG/ACT nasal spray Commonly known as: Flonase Place 1 spray into both nostrils daily. What changed:  when to take this reasons to take this   Flutter Devi 1 Device by Does not apply route as directed.   furosemide 20 MG tablet Commonly known as: Lasix Take 1 tablet (20 mg total) by mouth daily. What changed:  how much to take when to take this additional instructions   guaiFENesin 600 MG 12 hr tablet Commonly known as: MUCINEX Take 1 tablet (600 mg total) by mouth 2 (two) times daily. What changed:  how much to take additional instructions   levofloxacin 750 MG tablet Commonly known as: Levaquin Take 1 tablet (750 mg total) by mouth daily for 7 days. What changed: additional instructions   OSTEO BI-FLEX ADV JOINT SHIELD PO Take 1 tablet by  mouth daily.   OXYGEN Inhale 5 L/min into the lungs continuous.   polyethylene glycol 17 g packet Commonly known as: MIRALAX / GLYCOLAX Take 17 g by mouth daily as needed for mild constipation.   predniSONE 20 MG tablet Commonly known as: DELTASONE Take 1 tablet (20 mg total) by mouth daily with breakfast.   PRESERVISION AREDS 2 PO Take 1 capsule by mouth 2 (two) times daily.   PROBIOTIC-10 PO Take 1 tablet by mouth at bedtime.   rOPINIRole 1 MG tablet Commonly known as: REQUIP Take 2 mg by mouth at bedtime.   saccharomyces boulardii 250 MG capsule Commonly known as: FLORASTOR Take 1 capsule (250 mg total) by mouth 2 (two) times daily for 20 days.   Saw Palmetto 450 MG Caps Take 450-900 mg by mouth See admin instructions. Takes 900 mg  in the morning and 450 mg at night.   sodium chloride HYPERTONIC 3 % nebulizer solution Take 4 mLs by nebulization 2 (two) times daily as needed for cough.   Stiolto Respimat 2.5-2.5 MCG/ACT Aers Generic drug: Tiotropium Bromide-Olodaterol Inhale 2 puffs into the lungs daily.   tamsulosin 0.4 MG Caps capsule Commonly known as: FLOMAX 0.4 mg daily.   Turmeric 500 MG Caps Take 1,000 mg by mouth in the morning and at bedtime.               Durable Medical Equipment  (From admission, onward)           Start     Ordered   06/16/21 1213  For home use only DME oxygen  Once       Question Answer Comment  Length of Need 6 Months   Mode or (Route) Nasal cannula   Liters per Minute 5   Frequency Continuous (stationary and portable oxygen unit needed)   Oxygen conserving device Yes   Oxygen delivery system Gas      06/16/21 1213           Allergies  Allergen Reactions   Anoro Ellipta [Umeclidinium-Vilanterol] Hives   Prolixin [Fluphenazine]     Severe back pain   Spiriva Respimat [Tiotropium Bromide Monohydrate] Other (See Comments)    unknown   Advair Diskus [Fluticasone-Salmeterol] Anxiety    Follow-up  Information     Parrett, Fonnie Mu, NP. Go on 06/28/2021.   Specialty: Pulmonary Disease Contact information: [place] [address] [phone]         [facility] Call today.   Why: Please call for a posthospital follow-up appointment. Contact information: [place] [address] [phone]         Lanier Clam, MD. Call today.   Specialty: Pulmonary Disease Why: Please call for a posthospital follow-up appointment. Contact information: [address] [place] [address] [phone]                  The results of significant diagnostics from this hospitalization (including imaging, microbiology, ancillary and laboratory) are listed below for reference.    Significant Diagnostic Studies: DG Chest 2 View  Result Date: 06/09/2021 CLINICAL DATA:  Shortness of breath. History of COPD and lung cancer. EXAM: CHEST - 2 VIEW COMPARISON:  Chest radiograph 04/24/2020 and CT 04/19/2021 FINDINGS: Postsurgical changes are again seen from left upper lobectomy with associated left hemithorax volume loss and leftward shift of the mediastinal structures. The heart is obscured. Density in the left lung apex corresponds to a chronic, loculated pleural effusion on CT. Compared to the prior CT, there is worsening aeration of the left lung with extensive airspace opacity throughout the left mid and lower lung. Underlying emphysema is noted in the right lung with right basilar scarring. No pneumothorax is identified. No acute osseous abnormality is seen. IMPRESSION: Increased dense opacity in the left mid and lower lung which may reflect pneumonia Electronically Signed   By: Logan Bores M.D.   On: 06/09/2021 16:08   CT Angio Chest Pulmonary Embolism (PE) W or WO Contrast  Result Date: 06/09/2021 CLINICAL DATA:  Hypoxemia and shortness of breath. 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 EXAM: CT ANGIOGRAPHY CHEST WITH CONTRAST TECHNIQUE: Multidetector CT imaging of the chest was performed using the standard protocol during bolus administration of intravenous contrast. Multiplanar CT image  reconstructions and MIPs were obtained to evaluate the vascular anatomy. CONTRAST:  25mL OMNIPAQUE IOHEXOL 350 MG/ML SOLN COMPARISON:  Chest CT dated 04/19/2021. FINDINGS: Evaluation of this exam is limited due to respiratory motion artifact. Cardiovascular: No cardiomegaly or pericardial effusion. Mild atherosclerotic calcification of the thoracic aorta. No aneurysmal dilatation or dissection. There is dilatation of the main pulmonary trunk suggestive of pulmonary hypertension. Evaluation of the pulmonary arteries is limited due to respiratory motion artifact. No large or central pulmonary artery embolus identified. Apparent filling defects in the distal subsegmental pulmonary artery branches in the right lower lobe (222-226/5) may be artifactual and related to respiratory motion. Small pulmonary artery emboli are not excluded. No CT evidence of right heart straining. Mediastinum/Nodes: No definite hilar or mediastinal adenopathy. The esophagus is grossly unremarkable. No mediastinal fluid collection. Lungs/Pleura: Postsurgical changes of left upper lobectomy. Similar appearance of a chronic and loculated pleural effusion at the left lung apex. There is interval complete consolidation of the left lower lobe with air bronchogram, new since the prior CT. Apparent debris or mucous plugging noted in the left lower lobe bronchus (128/5) and findings likely represent postobstructive atelectasis or pneumonia. There is background of severe emphysema. There is a 1 cm nodule with irregular or somewhat margin in the right middle lobe/perihilar region (78/10) has increased in size since 04/17/2020 and concerning for a neoplastic process. Multidisciplinary consult is advised. Similar  appearance of a 1 cm subpleural nodule in the superior segment of the right lower lobe medially, possibly scarring. Compensatory hyperinflation of the right lung with chronic post inflammatory changes at the right lung base. No pneumothorax. Upper Abdomen: No acute abnormality. Musculoskeletal: Degenerative changes of the spine. Postsurgical changes of the posterior left ribs. No acute osseous pathology. Review of the MIP images confirms the above findings. IMPRESSION: 1. Limited study due to respiratory motion artifact. No large or central pulmonary artery embolus identified. Apparent filling defects in the distal subsegmental pulmonary artery branches in the right lower lobe may be artifactual and related to respiratory motion artifact. Small pulmonary artery emboli are not excluded. 2. Interval complete consolidation of the left lower lobe with air bronchogram, new since the prior CT. Apparent debris or mucous plugging in the left lower lobe bronchus. Findings likely represent postobstructive atelectasis or pneumonia. 3. Interval increase in size of the right middle lobe/perihilar nodule concerning for a neoplastic process. Multidisciplinary consult is advised. 4. Postsurgical changes of left upper lobectomy. Similar appearance of a chronic and loculated pleural effusion at the left lung apex. 5. Aortic Atherosclerosis (ICD10-I70.0) and Emphysema (ICD10-J43.9). Electronically Signed   By: Anner Crete M.D.   On: 06/09/2021 19:32   [name] EKG SITE RITE  Result Date: 06/15/2021 If Site Rite image not attached, placement could not be confirmed due to current cardiac rhythm.   Microbiology: Recent Results (from the past 240 hour(s))  Resp Panel by RT-PCR (Flu A&B, Covid) Nasopharyngeal Swab     Status: None   Collection Time: 06/09/21  3:26 PM   Specimen: Nasopharyngeal Swab; Nasopharyngeal(NP) swabs in vial transport medium  Result Value Ref Range Status   SARS Coronavirus 2 by RT PCR NEGATIVE NEGATIVE  Final    Comment: (NOTE) SARS-CoV-2 target nucleic acids are NOT DETECTED.  The SARS-CoV-2 RNA is generally detectable in upper respiratory specimens during the acute phase of infection. The lowest concentration of SARS-CoV-2 viral copies this assay can detect is 138 copies/mL. A negative result does not preclude SARS-Cov-2 infection and should not be used  as the sole basis for treatment or other patient management decisions. A negative result may occur with  improper specimen collection/handling, submission of specimen other than nasopharyngeal swab, presence of viral mutation(s) within the areas targeted by this assay, and inadequate number of viral copies(<138 copies/mL). A negative result must be combined with clinical observations, patient history, and epidemiological information. The expected result is Negative.  Fact Sheet for Patients:  EntrepreneurPulse.com.au  Fact Sheet for Healthcare Providers:  IncredibleEmployment.be  This test is no t yet approved or cleared by the [place] FDA and  has been authorized for detection and/or diagnosis of SARS-CoV-2 by FDA under an Emergency Use Authorization (EUA). This EUA will remain  in effect (meaning this test can be used) for the duration of the COVID-19 declaration under Section 564(b)(1) of the Act, 21 U.S.C.section 360bbb-3(b)(1), unless the authorization is terminated  or revoked sooner.       Influenza A by PCR NEGATIVE NEGATIVE Final   Influenza B by PCR NEGATIVE NEGATIVE Final    Comment: (NOTE) The Xpert Xpress SARS-CoV-2/FLU/RSV plus assay is intended as an aid in the diagnosis of influenza from Nasopharyngeal swab specimens and should not be used as a sole basis for treatment. Nasal washings and aspirates are unacceptable for Xpert Xpress SARS-CoV-2/FLU/RSV testing.  Fact Sheet for Patients: EntrepreneurPulse.com.au  Fact Sheet for Healthcare  Providers: IncredibleEmployment.be  This test is not yet approved or cleared by the [place] FDA and has been authorized for detection and/or diagnosis of SARS-CoV-2 by FDA under an Emergency Use Authorization (EUA). This EUA will remain in effect (meaning this test can be used) for the duration of the COVID-19 declaration under Section 564(b)(1) of the Act, 21 U.S.C. section 360bbb-3(b)(1), unless the authorization is terminated or revoked.  Performed at [facility], [place] [address], [place], [place] [address]   Culture, blood (Routine x 2)     Status: None   Collection Time: 06/09/21  4:16 PM   Specimen: BLOOD  Result Value Ref Range Status   Specimen Description   Final    BLOOD RIGHT ANTECUBITAL Performed at [facility] [address], [place], [place] [address]    Special Requests   Final    BOTTLES DRAWN AEROBIC AND ANAEROBIC Blood Culture results may not be optimal due to an excessive volume of blood received in culture bottles Performed at [facility] [address] [place], [place] [address]    Culture   Final    NO GROWTH 5 DAYS Performed at [facility], [place] [address] [place], [place] [address]    Report Status 06/14/2021 FINAL  Final  Culture, blood (Routine x 2)     Status: None   Collection Time: 06/09/21  4:16 PM   Specimen: BLOOD  Result Value Ref Range Status   Specimen Description   Final    BLOOD BLOOD RIGHT FOREARM Performed at [facility] [address], [place], [place] [address]    Special Requests   Final    BOTTLES DRAWN AEROBIC AND ANAEROBIC Blood Culture results may not be optimal due to an excessive volume of blood received in culture bottles Performed at [facility] [address], [place], [place] [address]    Culture   Final    NO GROWTH 5 DAYS Performed at [facility], [place] [address],  [place],  [address]    Report Status 06/14/2021 FINAL  Final  Expectorated  Sputum Assessment w Gram Stain, Rflx to Resp Cult     Status: None   Collection Time: 06/10/21  3:01 PM   Specimen: Sputum  Result Value Ref Range Status   Specimen Description SPUTUM  Final   Special Requests NONE  Final   Sputum evaluation   Final    THIS SPECIMEN IS ACCEPTABLE FOR SPUTUM CULTURE Performed at [facility], [place] [address] [place], [place] [address]    Report Status 06/10/2021 FINAL  Final  Culture, Respiratory w Gram Stain     Status: None   Collection Time: 06/10/21  3:01 PM   Specimen: SPU  Result Value Ref Range Status   Specimen Description   Final    SPUTUM Performed at [facility] [address], [place], [place] [address]    Special Requests   Final    NONE Reflexed from [id number] Performed at [facility], [place] [address] [place], [place] [address]    Gram Stain   Final    MODERATE WBC PRESENT,BOTH PMN AND MONONUCLEAR FEW GRAM POSITIVE COCCI IN PAIRS RARE BUDDING YEAST SEEN    Culture   Final    RARE Normal respiratory flora-no Staph aureus or Pseudomonas seen Performed at [facility], [address] [place], [place] [address]    Report Status 06/12/2021 FINAL  Final     Labs: Basic Metabolic Panel: Recent Labs  Lab 06/10/21 0831 06/11/21 0352 06/12/21 0337 06/13/21 0328 06/15/21 0327  NA 138 142 139 140 141  K 4.2 4.9 4.2 4.1 4.6  CL 92* 92* 91* 95* 99  CO2 36* 41* 41* 37* 34*  GLUCOSE 168* 169* 137* 127* 99  BUN 26* 23 27* 30* 26*  CREATININE 0.80 0.66 1.05 0.94 0.82  CALCIUM 9.0 8.7* 8.6* 8.5* 8.8*   Liver Function Tests: Recent Labs  Lab 06/09/21 1616 06/10/21 0831  AST 17 15  ALT 13 11  ALKPHOS 61 55  BILITOT 0.9 1.0  PROT 7.9 7.1  ALBUMIN 3.6 3.3*   No results for input(s): LIPASE, AMYLASE in the last 168 hours. No results for input(s): AMMONIA in the last 168  hours. CBC: Recent Labs  Lab 06/09/21 1616 06/10/21 0000 06/10/21 0831 06/11/21 0352 06/12/21 0337  WBC 10.2 11.6* 12.8* 14.5* 13.5*  HGB 15.4 13.9 13.8 13.4 12.8*  HCT 51.3 45.9 45.4 45.6 44.0  MCV 90.6 89.6 89.5 91.6 92.6  PLT 250 251 260 229 249   Cardiac Enzymes: No results for input(s): CKTOTAL, CKMB, CKMBINDEX, TROPONINI in the last 168 hours. BNP: BNP (last 3 results) No results for input(s): BNP in the last 8760 hours.  ProBNP (last 3 results) No results for input(s): PROBNP in the last 8760 hours.  CBG: No results for input(s): GLUCAP in the last 168 hours.     Signed:  Kayleen Memos, MD [facility] 06/16/2021, 12:38 PM

## 2021-06-16 NOTE — Progress Notes (Signed)
Oxygen tanks delivered to patient in room.  Patient and patient's wife instructed on use, verbalized understanding.  PIV and telemetry removed.  Discharge instructions provided to and reviewed with patient and patient's wife, who verbalized understanding.  Patient escorted to main entrance via wheelchair with belongings for transport home with wife.  Angie Fava, RN

## 2021-06-17 ENCOUNTER — Telehealth: Payer: Self-pay | Admitting: Pulmonary Disease

## 2021-06-17 NOTE — Telephone Encounter (Signed)
Dr Silas Flood- please advise if okay for [name] to give VO for PT and OT, thanks!

## 2021-06-18 NOTE — Telephone Encounter (Signed)
Called Jeffrey Hines back and there was no answer- LMTCB.

## 2021-06-18 NOTE — Telephone Encounter (Signed)
Yes please

## 2021-06-18 NOTE — Telephone Encounter (Signed)
Called and talked to Cindie to make sure they didn't need anything else from [name]. She states that she saw Dr. Kavin Leech note in Epic and they are good. Nothing further needed at this time.

## 2021-06-21 DIAGNOSIS — J470 Bronchiectasis with acute lower respiratory infection: Secondary | ICD-10-CM | POA: Diagnosis not present

## 2021-06-21 DIAGNOSIS — I700 Atherosclerosis of aorta: Secondary | ICD-10-CM | POA: Diagnosis not present

## 2021-06-21 DIAGNOSIS — Z85118 Personal history of other malignant neoplasm of bronchus and lung: Secondary | ICD-10-CM | POA: Diagnosis not present

## 2021-06-21 DIAGNOSIS — F411 Generalized anxiety disorder: Secondary | ICD-10-CM | POA: Diagnosis not present

## 2021-06-21 DIAGNOSIS — G2581 Restless legs syndrome: Secondary | ICD-10-CM | POA: Diagnosis not present

## 2021-06-21 DIAGNOSIS — I10 Essential (primary) hypertension: Secondary | ICD-10-CM | POA: Diagnosis not present

## 2021-06-21 DIAGNOSIS — Z902 Acquired absence of lung [part of]: Secondary | ICD-10-CM | POA: Diagnosis not present

## 2021-06-21 DIAGNOSIS — J471 Bronchiectasis with (acute) exacerbation: Secondary | ICD-10-CM | POA: Diagnosis not present

## 2021-06-21 DIAGNOSIS — Z7952 Long term (current) use of systemic steroids: Secondary | ICD-10-CM | POA: Diagnosis not present

## 2021-06-21 DIAGNOSIS — J9622 Acute and chronic respiratory failure with hypercapnia: Secondary | ICD-10-CM | POA: Diagnosis not present

## 2021-06-21 DIAGNOSIS — J189 Pneumonia, unspecified organism: Secondary | ICD-10-CM | POA: Diagnosis not present

## 2021-06-21 DIAGNOSIS — J439 Emphysema, unspecified: Secondary | ICD-10-CM | POA: Diagnosis not present

## 2021-06-21 DIAGNOSIS — Z9181 History of falling: Secondary | ICD-10-CM | POA: Diagnosis not present

## 2021-06-21 DIAGNOSIS — J9621 Acute and chronic respiratory failure with hypoxia: Secondary | ICD-10-CM | POA: Diagnosis not present

## 2021-06-21 DIAGNOSIS — Z7901 Long term (current) use of anticoagulants: Secondary | ICD-10-CM | POA: Diagnosis not present

## 2021-06-21 DIAGNOSIS — Z9981 Dependence on supplemental oxygen: Secondary | ICD-10-CM | POA: Diagnosis not present

## 2021-06-23 DIAGNOSIS — J439 Emphysema, unspecified: Secondary | ICD-10-CM | POA: Diagnosis not present

## 2021-06-23 DIAGNOSIS — J9622 Acute and chronic respiratory failure with hypercapnia: Secondary | ICD-10-CM | POA: Diagnosis not present

## 2021-06-23 DIAGNOSIS — J9621 Acute and chronic respiratory failure with hypoxia: Secondary | ICD-10-CM | POA: Diagnosis not present

## 2021-06-23 DIAGNOSIS — J470 Bronchiectasis with acute lower respiratory infection: Secondary | ICD-10-CM | POA: Diagnosis not present

## 2021-06-23 DIAGNOSIS — J471 Bronchiectasis with (acute) exacerbation: Secondary | ICD-10-CM | POA: Diagnosis not present

## 2021-06-23 DIAGNOSIS — J189 Pneumonia, unspecified organism: Secondary | ICD-10-CM | POA: Diagnosis not present

## 2021-06-24 DIAGNOSIS — J9621 Acute and chronic respiratory failure with hypoxia: Secondary | ICD-10-CM | POA: Diagnosis not present

## 2021-06-24 DIAGNOSIS — J470 Bronchiectasis with acute lower respiratory infection: Secondary | ICD-10-CM | POA: Diagnosis not present

## 2021-06-24 DIAGNOSIS — J9622 Acute and chronic respiratory failure with hypercapnia: Secondary | ICD-10-CM | POA: Diagnosis not present

## 2021-06-24 DIAGNOSIS — J189 Pneumonia, unspecified organism: Secondary | ICD-10-CM | POA: Diagnosis not present

## 2021-06-24 DIAGNOSIS — J439 Emphysema, unspecified: Secondary | ICD-10-CM | POA: Diagnosis not present

## 2021-06-24 DIAGNOSIS — J471 Bronchiectasis with (acute) exacerbation: Secondary | ICD-10-CM | POA: Diagnosis not present

## 2021-06-28 ENCOUNTER — Other Ambulatory Visit: Payer: Self-pay

## 2021-06-28 ENCOUNTER — Telehealth: Payer: Self-pay | Admitting: Physician Assistant

## 2021-06-28 ENCOUNTER — Ambulatory Visit (INDEPENDENT_AMBULATORY_CARE_PROVIDER_SITE_OTHER): Payer: Medicare Other | Admitting: Primary Care

## 2021-06-28 ENCOUNTER — Ambulatory Visit (INDEPENDENT_AMBULATORY_CARE_PROVIDER_SITE_OTHER): Payer: Medicare Other

## 2021-06-28 ENCOUNTER — Ambulatory Visit: Payer: Medicare Other | Admitting: Adult Health

## 2021-06-28 ENCOUNTER — Encounter: Payer: Self-pay | Admitting: Primary Care

## 2021-06-28 VITALS — BP 126/64 | HR 101 | Temp 98.0°F | Ht 71.0 in | Wt 165.0 lb

## 2021-06-28 DIAGNOSIS — I2699 Other pulmonary embolism without acute cor pulmonale: Secondary | ICD-10-CM | POA: Diagnosis not present

## 2021-06-28 DIAGNOSIS — J151 Pneumonia due to Pseudomonas: Secondary | ICD-10-CM

## 2021-06-28 DIAGNOSIS — J9611 Chronic respiratory failure with hypoxia: Secondary | ICD-10-CM | POA: Diagnosis not present

## 2021-06-28 DIAGNOSIS — J449 Chronic obstructive pulmonary disease, unspecified: Secondary | ICD-10-CM

## 2021-06-28 DIAGNOSIS — J471 Bronchiectasis with (acute) exacerbation: Secondary | ICD-10-CM | POA: Diagnosis not present

## 2021-06-28 DIAGNOSIS — C3491 Malignant neoplasm of unspecified part of right bronchus or lung: Secondary | ICD-10-CM

## 2021-06-28 DIAGNOSIS — J439 Emphysema, unspecified: Secondary | ICD-10-CM | POA: Diagnosis not present
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
## 2021-06-28 NOTE — Assessment & Plan Note (Addendum)
-   Discharged on 5L supplemental oxygen. Add humidification to oxygen. Patient is getting standing labs with Dr. Earlie Server Wednesday- monitor CO2 level. Patient reports intolerance to PAP therapy.

## 2021-06-28 NOTE — Progress Notes (Signed)
@Patient  ID: [name], male    DOB: November 14, 1945, 76 y.o.   MRN: [id number]  Chief Complaint  Patient presents with   Hospitalization [facility] follow up for PNA and SHOB. Pt is currently on 5L of o2 via . 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 Patient of Dr. Silas Flood, last seen on 06/01/21.   Previous [facility] pulmonary encounter:  06/01/21- Dr. Silas Flood  Since last visit seen by [facility].  Concern for exacerbation placed on levofloxacin.  Since that visit, mild improvement in pleuritic chest pain.  Some improvement in cough.  Notably more hypoxemic.  Not using to 4 L at rest and more with exertion.  Pulse portable oxygen concentrator does not keep up any desats with exertion.  Discussed getting home oxygen concentrator which is in the works.  Again discussed ongoing care and hope for improvement with this acute decline but again briefly discussed hospice services.  Patient feels they are not needed at this time.  Caregiver and wife also felt like things are being adequately managed at this time.  06/28/2021 Patient presents today for hospital follow-up. PMH significant for COPD- steroid/O2 dependent, chronic hypoxemia respiratory, bronchiectasis hx pseudomonas, lung cancer s/p LUL resection. He was admitted from 06/09/21-06/16/21 for CAP. Initially treated with BIPAP d/t hypercapnia. CAP treated with Zosyn and azithromycin. CTA showed no large central PE but could not rule out RLL pulmonary embolism. Patient declined PICC line placement for home IV antibiotics. Seen by Dr. Juleen China with ID, recommend 2 week course antibiotics for  bronchiectasis flare. He was discharged on Levaquin 750mg  to end 06/23/21. He completed antibiotic. He is feeling significantly better, essentially back to his baseline. He still has an occasional productive cough with clear- light yellow/tan mucus but this has improved. Breathing has also improved. He still gets out of breath with exertion. He is using Stiolot respimat 2 puffs daily. He uses albuterol/hypertonic once daily. He is on 5L supplemental oxygen. Able to maintain O2 >88-90% for 1-2 mins before dropping into 70s on 5L. He has had some mild intermittent nose bleeds in the morning the last day or two.  He is taking blood thinner as directed. The last week his symptoms have slowly appeared since completing abx. He was previously on rotating Augmentin and doxycycline. Wife states that Augmentin does not seem to help when he takes it. No fevers, chills, sweats, chest pain.    Allergies  Allergen Reactions   Anoro Ellipta [Umeclidinium-Vilanterol] Hives   Prolixin [Fluphenazine]     Severe back pain   Spiriva Respimat [Tiotropium Bromide Monohydrate] Other (See Comments)    unknown   Advair Diskus [Fluticasone-Salmeterol] Anxiety    Immunization History  Administered Date(s) Administered   Fluad Quad(high Dose 65+) 03/20/2020, 03/17/2021   Influenza Inj Mdck Quad Pf 03/01/2018   Influenza Split 05/27/2013, 02/25/2014, 04/14/2015   Influenza Whole 02/27/2014   Influenza, High Dose Seasonal PF 06/02/2016, 03/06/2017, 04/06/2017, 03/01/2018, 02/19/2019   Influenza,inj,quad, With Preservative 02/18/2019   Influenza-Unspecified 03/25/1998, 04/19/1999, 05/06/2006, 02/26/2007, 04/07/2008, 03/17/2009, 04/05/2010, 05/06/2011, 06/14/2012, 02/25/2014, 04/13/2015, 12/27/2019   Moderna Covid-19 Vaccine Bivalent Booster 81yrs & up 04/23/2021   Levan Hurst  Sars-Cov-2 Peds vaccine 71yrs thru 77yrs 04/23/2021   Moderna Sars-Covid-2 Vaccination 07/09/2019, 08/06/2019, 09/17/2020   Pneumococcal Conjugate-13  06/12/2014, 04/14/2015   Pneumococcal Polysaccharide-23 05/03/2016, 05/24/2016   Pneumococcal-Unspecified 04/19/1999   Tetanus 06/14/2012   Zoster Recombinat (Shingrix) 12/27/2019, 02/27/2020    Past Medical History:  Diagnosis Date   Anxiety    Cancer ([place])    Chronic lower back pain    Closed head injury 1998   Constipation due to opioid therapy    COPD (chronic obstructive pulmonary disease) ([place])    COPD with chronic bronchitis (HCC)    Full dentures    GERD (gastroesophageal reflux disease)    Hemoptysis 08/03/2014   Hypertension    Multiple rib fractures 1998   left side   On supplemental oxygen therapy    2L of O2 at night   Post-obstruction pneumonia due to Pseudomonas aeruginosa 08/07/2014   Endobronchial mass causing LUL obstruction   Radiation 08/25/14-09/29/14   left upper central lung 45 Gy   Seizures (HCC)    Shortness of breath dyspnea    Shoulder dislocation 1998   left   Spontaneous pneumothorax 08/03/2014   Left side 1st time episode spontaneous pneumothorax associated with acute flare of COPD    Tobacco abuse     Tobacco History: Social History   Tobacco Use  Smoking Status Former   Packs/day: 0.50   Years: 50.00   Pack years: 25.00   Types: Cigarettes   Quit date: 08/03/2014   Years since quitting: 6.9  Smokeless Tobacco Never   Counseling given: Not Answered   Outpatient Medications Prior to Visit  Medication Sig Dispense Refill   acetaminophen (TYLENOL) 500 MG tablet Take 1,000 mg by mouth every 8 (eight) hours as needed for moderate pain.     albuterol (PROVENTIL) (2.5 MG/3ML) 0.083% nebulizer solution Take 3 mLs (2.5 mg total) by nebulization every 2 (two) hours as needed for wheezing or shortness of breath. 75 mL 12   albuterol (VENTOLIN HFA) 108 (90 Base) MCG/ACT inhaler Inhale 2 puffs into the lungs every 6 (six) hours as needed for wheezing or shortness of breath.     apixaban (ELIQUIS) 5 MG TABS tablet Take 1 tablet (5 mg total) by mouth  2 (two) times daily. 180 tablet 0   azelastine (ASTELIN) 0.1 % nasal spray Place 1 spray into the nose 2 (two) times daily as needed for rhinitis.     BL GLUCOSAMINE-CHONDROITIN PO Take 1 tablet by mouth daily.     clonazePAM (KLONOPIN) 0.5 MG tablet Take 0.5 mg by mouth 2 (two) times daily.      fluticasone (FLONASE) 50 MCG/ACT nasal spray Place 1 spray into both nostrils daily. (Patient taking differently: Place 1 spray into both nostrils daily as needed for allergies.) 16 g 2   furosemide (LASIX) 20 MG tablet Take 1 tablet (20 mg total) by mouth daily. (Patient taking differently: Take 20-40 mg by mouth See admin instructions. 20 mg on Monday Wednesday Thursday and Saturdays 40 mg Tuesday Friday and Sunday) 30 tablet 0   guaiFENesin (MUCINEX) 600 MG 12 hr tablet Take 1 tablet (600 mg total) by mouth 2 (two) times daily. (Patient taking differently: Take 400 mg by mouth 2 (two) times daily. Gets 400 mg tablet from [place]) 60 tablet 0   Loratadine-Pseudoephedrine (CLARITIN-D 12 HOUR PO) Take 1 tablet by mouth 2 (two) times daily.     Misc Natural Products (OSTEO BI-FLEX ADV JOINT SHIELD PO) Take 1 tablet by mouth daily.  Multiple Vitamins-Minerals (PRESERVISION AREDS 2 PO) Take 1 capsule by mouth 2 (two) times daily.     OXYGEN Inhale 5 L/min into the lungs continuous.     polyethylene glycol (MIRALAX / GLYCOLAX) 17 g packet Take 17 g by mouth daily as needed for mild constipation. 14 each 0   predniSONE (DELTASONE) 20 MG tablet Take 1 tablet (20 mg total) by mouth daily with breakfast. 30 tablet 4   Probiotic Product (PROBIOTIC-10 PO) Take 1 tablet by mouth at bedtime.     Respiratory Therapy Supplies (FLUTTER) DEVI 1 Device by Does not apply route as directed. 1 each 0   rOPINIRole (REQUIP) 1 MG tablet Take 2 mg by mouth at bedtime.     saccharomyces boulardii (FLORASTOR) 250 MG capsule Take 1 capsule (250 mg total) by mouth 2 (two) times daily for 20 days. 40 capsule 0   Saw Palmetto 450 MG CAPS  Take 450-900 mg by mouth See admin instructions. Takes 900 mg in the morning and 450 mg at night.     sodium chloride HYPERTONIC 3 % nebulizer solution Take 4 mLs by nebulization 2 (two) times daily as needed for cough.      tamsulosin (FLOMAX) 0.4 MG CAPS capsule 0.4 mg daily.     Tiotropium Bromide-Olodaterol (STIOLTO RESPIMAT) 2.5-2.5 MCG/ACT AERS Inhale 2 puffs into the lungs daily.      Turmeric 500 MG CAPS Take 1,000 mg by mouth in the morning and at bedtime.     No facility-administered medications prior to visit.   Review of Systems  Review of Systems  Constitutional: Negative.   HENT:  Positive for congestion.   Respiratory:  Positive for cough.     Physical Exam  BP 126/64 (BP Location: Right Arm, Patient Position: Sitting, Cuff Size: Normal)    Pulse (!) 101    Temp 98 F (36.7 C) (Oral)    Ht 5\' 11"  (1.803 m)    Wt 165 lb (74.8 kg)    SpO2 99%    BMI 23.01 kg/m  Physical Exam Constitutional:      Appearance: Normal appearance.  HENT:     Head: Normocephalic and atraumatic.     Mouth/Throat:     Mouth: Mucous membranes are moist.     Pharynx: Oropharynx is clear.  Cardiovascular:     Rate and Rhythm: Normal rate and regular rhythm.  Pulmonary:     Effort: Pulmonary effort is normal.     Breath sounds: Normal breath sounds. No wheezing, rhonchi or rales.     Comments: Mostly clear; No overt rhonchi or wheezing. On 5L oxygen  Musculoskeletal:        General: Normal range of motion.  Skin:    General: Skin is warm and dry.  Neurological:     General: No focal deficit present.     Mental Status: He is alert and oriented to person, place, and time. Mental status is at baseline.  Psychiatric:        Mood and Affect: Mood normal.        Behavior: Behavior normal.        Thought Content: Thought content normal.        Judgment: Judgment normal.     Lab Results:  CBC    Component Value Date/Time   WBC 13.5 (H) 06/12/2021 0337   RBC 4.75 06/12/2021 0337   HGB  12.8 (L) 06/12/2021 0337   HGB 13.9 04/19/2021 1126   HGB 12.9 (L) 02/08/2017 1343  HCT 44.0 06/12/2021 0337   HCT 38.9 02/08/2017 1343   PLT 249 06/12/2021 0337   PLT 239 04/19/2021 1126   PLT 218 02/08/2017 1343   MCV 92.6 06/12/2021 0337   MCV 85.7 02/08/2017 1343   MCH 26.9 06/12/2021 0337   MCHC 29.1 (L) 06/12/2021 0337   RDW 14.1 06/12/2021 0337   RDW 12.8 02/08/2017 1343   LYMPHSABS 0.3 (L) 04/19/2021 1126   LYMPHSABS 0.9 02/08/2017 1343   MONOABS 0.4 04/19/2021 1126   MONOABS 0.5 02/08/2017 1343   EOSABS 0.0 04/19/2021 1126   EOSABS 0.1 02/08/2017 1343   BASOSABS 0.0 04/19/2021 1126   BASOSABS 0.0 02/08/2017 1343    BMET    Component Value Date/Time   NA 141 06/15/2021 0327   NA 142 02/08/2017 1344   K 4.6 06/15/2021 0327   K 3.5 02/08/2017 1344   CL 99 06/15/2021 0327   CO2 34 (H) 06/15/2021 0327   CO2 28 02/08/2017 1344   GLUCOSE 99 06/15/2021 0327   GLUCOSE 113 02/08/2017 1344   BUN 26 (H) 06/15/2021 0327   BUN 13.5 02/08/2017 1344   CREATININE 0.82 06/15/2021 0327   CREATININE 0.99 04/19/2021 1126   CREATININE 0.9 02/08/2017 1344   CALCIUM 8.8 (L) 06/15/2021 0327   CALCIUM 9.0 02/08/2017 1344   GFRNONAA >60 06/15/2021 0327   GFRNONAA >60 04/19/2021 1126   GFRAA >60 01/17/2020 1355    BNP    Component Value Date/Time   BNP 30.5 08/03/2014 1359    ProBNP    Component Value Date/Time   PROBNP 101.0 (H) 01/07/2020 1641    Imaging: DG Chest 2 View  Result Date: 06/09/2021 CLINICAL DATA:  Shortness of breath. History of COPD and lung cancer. EXAM: CHEST - 2 VIEW COMPARISON:  Chest radiograph 04/24/2020 and CT 04/19/2021 FINDINGS: Postsurgical changes are again seen from left upper lobectomy with associated left hemithorax volume loss and leftward shift of the mediastinal structures. The heart is obscured. Density in the left lung apex corresponds to a chronic, loculated pleural effusion on CT. Compared to the prior CT, there is worsening aeration  of the left lung with extensive airspace opacity throughout the left mid and lower lung. Underlying emphysema is noted in the right lung with right basilar scarring. No pneumothorax is identified. No acute osseous abnormality is seen. IMPRESSION: Increased dense opacity in the left mid and lower lung which may reflect pneumonia Electronically Signed   By: Logan Bores M.D.   On: 06/09/2021 16:08   CT Angio Chest Pulmonary Embolism (PE) W or WO Contrast  Result Date: 06/09/2021 CLINICAL DATA:  Hypoxemia and shortness of breath. 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 EXAM: CT ANGIOGRAPHY CHEST WITH CONTRAST TECHNIQUE: Multidetector CT imaging of the chest was performed using the standard protocol during bolus administration of intravenous contrast. Multiplanar CT image reconstructions and MIPs were obtained to evaluate the vascular anatomy. CONTRAST:  30mL OMNIPAQUE IOHEXOL 350 MG/ML SOLN COMPARISON:  Chest CT dated 04/19/2021. FINDINGS: Evaluation of this exam is limited due to respiratory motion artifact. Cardiovascular: No cardiomegaly or pericardial effusion. Mild atherosclerotic calcification of the thoracic aorta. No aneurysmal dilatation or dissection. There is dilatation of the main pulmonary trunk suggestive of pulmonary hypertension. Evaluation of the pulmonary arteries is limited due to respiratory motion artifact. No large or central pulmonary artery embolus identified. Apparent filling defects in the distal subsegmental pulmonary artery branches in the right lower lobe (222-226/5) may be artifactual and related  to respiratory motion. Small pulmonary artery emboli are not excluded. No CT evidence of right heart straining. Mediastinum/Nodes: No definite hilar or mediastinal adenopathy. The esophagus is grossly unremarkable. No mediastinal fluid collection. Lungs/Pleura: Postsurgical changes of left upper lobectomy. Similar appearance of a chronic and  loculated pleural effusion at the left lung apex. There is interval complete consolidation of the left lower lobe with air bronchogram, new since the prior CT. Apparent debris or mucous plugging noted in the left lower lobe bronchus (128/5) and findings likely represent postobstructive atelectasis or pneumonia. There is background of severe emphysema. There is a 1 cm nodule with irregular or somewhat margin in the right middle lobe/perihilar region (78/10) has increased in size since 04/17/2020 and concerning for a neoplastic process. Multidisciplinary consult is advised. Similar appearance of a 1 cm subpleural nodule in the superior segment of the right lower lobe medially, possibly scarring. Compensatory hyperinflation of the right lung with chronic post inflammatory changes at the right lung base. No pneumothorax. Upper Abdomen: No acute abnormality. Musculoskeletal: Degenerative changes of the spine. Postsurgical changes of the posterior left ribs. No acute osseous pathology. Review of the MIP images confirms the above findings. IMPRESSION: 1. Limited study due to respiratory motion artifact. No large or central pulmonary artery embolus identified. Apparent filling defects in the distal subsegmental pulmonary artery branches in the right lower lobe may be artifactual and related to respiratory motion artifact. Small pulmonary artery emboli are not excluded. 2. Interval complete consolidation of the left lower lobe with air bronchogram, new since the prior CT. Apparent debris or mucous plugging in the left lower lobe bronchus. Findings likely represent postobstructive atelectasis or pneumonia. 3. Interval increase in size of the right middle lobe/perihilar nodule concerning for a neoplastic process. Multidisciplinary consult is advised. 4. Postsurgical changes of left upper lobectomy. Similar appearance of a chronic and loculated pleural effusion at the left lung apex. 5. Aortic Atherosclerosis (ICD10-I70.0) and  Emphysema (ICD10-J43.9). Electronically Signed   By: Anner Crete M.D.   On: 06/09/2021 19:32   [name] EKG SITE RITE  Result Date: 06/15/2021 If Site Rite image not attached, placement could not be confirmed due to current cardiac rhythm.    Assessment & Plan:   Pneumonia due to Pseudomonas species ([place])  - Admitted 06/09/21-06/16/21 for CAP/bronchiectasis exacerbation, treated with Zosyn and azithromycin. Cough/dyspnea have improved. Repeat CXR today pending.   Bronchiectasis with (acute) exacerbation (HCC) - Chronic cough with mucus production. Previously on rotating Augmentin and Doxycycline. Recently hosp for PNA. Hx pseudomonas. Completed 14 day course of Levaquin 750mg . Reinforced importance of using hypertonic saline nebulizer TWICE daily. Unable to use therapy vest d/t past rib fractures, continue flutter valve. I will re-address rotating abx with Dr. Silas Flood  Chronic respiratory failure with hypoxia ([place]) - Discharged on 5L supplemental oxygen. Add humidification to oxygen. Patient is getting standing labs with Dr. Earlie Server Wednesday- monitor CO2 level. Patient reports intolerance to PAP therapy.   Acute pulmonary embolus (HCC) - CTA 06/09/21 showed no large central PE but could not rule out RLL pulmonary embolism - D/t tachycardia and hypoxia discharged on Apixaban, will need treatment for at least 3 months - Managed by pulmonary   COPD with chronic bronchitis (HCC) - Continue Stiolto Respimat two puff once daily and 20mg  daily prednisone which is baseline   Squamous cell carcinoma of lung, stage II (HCC) - S/P concurrent chemoradiation therapy and Lt VATS with wedge resection of LLL and thoracoscopic LULectomy with mediastinal LN dissection by Dr.  Hendrickson 12/15/14 - Followed by Dr. Julien Nordmann with oncology     Martyn Ehrich, NP 06/28/2021

## 2021-06-28 NOTE — Assessment & Plan Note (Addendum)
-   Continue Stiolto Respimat two puff once daily and 20mg  daily prednisone which is baseline

## 2021-06-28 NOTE — Assessment & Plan Note (Addendum)
-   S/P concurrent chemoradiation therapy and Lt VATS with wedge resection of LLL and thoracoscopic LULectomy with mediastinal LN dissection by Dr. Roxan Hockey 12/15/14 - Followed by Dr. Julien Nordmann with oncology

## 2021-06-28 NOTE — Assessment & Plan Note (Signed)
-   CTA 06/09/21 showed no large central PE but could not rule out RLL pulmonary embolism - D/t tachycardia and hypoxia discharged on Apixaban, will need treatment for at least 3 months - Managed by pulmonary

## 2021-06-28 NOTE — Assessment & Plan Note (Addendum)
-   Chronic cough with mucus production. Previously on rotating Augmentin and Doxycycline. Recently hosp for PNA. Hx pseudomonas. Completed 14 day course of Levaquin 750mg . Reinforced importance of using hypertonic saline nebulizer TWICE daily. Unable to use therapy vest d/t past rib fractures, continue flutter valve. I will re-address rotating abx with Dr. Silas Flood

## 2021-06-28 NOTE — Patient Instructions (Addendum)
Orders: - Continue Stiolto two puffs daily  - Add AYR nasal gel (over the counter) - Aim to use hypertonic saline TWICE a day  - Use flutter valve 2-3 times a day  - Use albuterol every 4-6 hours for breakthrough shortness of breath/wheezing  - I will discuss rotating antibiotics with Dr. Silas Flood and get back to you/along with CXR results from today  - Continue to monitor for bleeding, if nose bleeds worsen please notify our office   Orders: - Add humidification with oxygen  Follow-up: - 6-8 weeks with Dr. Silas Flood or sooner if needed

## 2021-06-28 NOTE — Assessment & Plan Note (Signed)
-   Admitted 06/09/21-06/16/21 for CAP/bronchiectasis exacerbation, treated with Zosyn and azithromycin. Cough/dyspnea have improved. Repeat CXR today pending.

## 2021-06-28 NOTE — Telephone Encounter (Signed)
Rescheduled per 1/23 inbasket req to move appts to afternoon, left msg with wife

## 2021-06-29 ENCOUNTER — Other Ambulatory Visit: Payer: Self-pay | Admitting: Physician Assistant

## 2021-06-29 DIAGNOSIS — J439 Emphysema, unspecified: Secondary | ICD-10-CM | POA: Diagnosis not present

## 2021-06-29 DIAGNOSIS — J9622 Acute and chronic respiratory failure with hypercapnia: Secondary | ICD-10-CM | POA: Diagnosis not present

## 2021-06-29 DIAGNOSIS — J189 Pneumonia, unspecified organism: Secondary | ICD-10-CM | POA: Diagnosis not present

## 2021-06-29 DIAGNOSIS — C3491 Malignant neoplasm of unspecified part of right bronchus or lung: Secondary | ICD-10-CM

## 2021-06-29 DIAGNOSIS — J470 Bronchiectasis with acute lower respiratory infection: Secondary | ICD-10-CM | POA: Diagnosis not present

## 2021-06-29 DIAGNOSIS — J471 Bronchiectasis with (acute) exacerbation: Secondary | ICD-10-CM | POA: Diagnosis not present

## 2021-06-29 DIAGNOSIS — J9621 Acute and chronic respiratory failure with hypoxia: Secondary | ICD-10-CM | POA: Diagnosis not present

## 2021-06-29 NOTE — Progress Notes (Signed)
Can take a look at this guys CXR, I sent you my note from yesterday. Cough mildly increased last day or two since discharge but overall feeling significantly better.

## 2021-06-30 ENCOUNTER — Encounter: Payer: Self-pay | Admitting: Physician Assistant

## 2021-06-30 ENCOUNTER — Telehealth: Payer: Self-pay | Admitting: Primary Care

## 2021-06-30 ENCOUNTER — Other Ambulatory Visit: Payer: Medicare Other

## 2021-06-30 ENCOUNTER — Telehealth: Payer: Self-pay | Admitting: Pulmonary Disease

## 2021-06-30 ENCOUNTER — Inpatient Hospital Stay (HOSPITAL_BASED_OUTPATIENT_CLINIC_OR_DEPARTMENT_OTHER): Payer: Medicare Other | Admitting: Physician Assistant

## 2021-06-30 ENCOUNTER — Inpatient Hospital Stay: Payer: Medicare Other | Attending: Internal Medicine

## 2021-06-30 ENCOUNTER — Ambulatory Visit: Payer: Medicare Other | Admitting: Physician Assistant

## 2021-06-30 ENCOUNTER — Other Ambulatory Visit: Payer: Self-pay

## 2021-06-30 VITALS — BP 125/85 | HR 111 | Temp 97.1°F | Resp 18 | Ht 71.0 in | Wt 160.1 lb

## 2021-06-30 DIAGNOSIS — J449 Chronic obstructive pulmonary disease, unspecified: Secondary | ICD-10-CM | POA: Diagnosis not present

## 2021-06-30 DIAGNOSIS — Z85118 Personal history of other malignant neoplasm of bronchus and lung: Secondary | ICD-10-CM | POA: Insufficient documentation

## 2021-06-30 DIAGNOSIS — C3491 Malignant neoplasm of unspecified part of right bronchus or lung: Secondary | ICD-10-CM | POA: Diagnosis not present

## 2021-06-30 DIAGNOSIS — R911 Solitary pulmonary nodule: Secondary | ICD-10-CM | POA: Insufficient documentation

## 2021-06-30 DIAGNOSIS — C3490 Malignant neoplasm of unspecified part of unspecified bronchus or lung: Secondary | ICD-10-CM | POA: Diagnosis not present

## 2021-06-30 DIAGNOSIS — Z9981 Dependence on supplemental oxygen: Secondary | ICD-10-CM | POA: Diagnosis not present

## 2021-06-30 LAB — CBC WITH DIFFERENTIAL (CANCER CENTER ONLY)
Abs Immature Granulocytes: 0.04 10*3/uL (ref 0.00–0.07)
Basophils Absolute: 0 10*3/uL (ref 0.0–0.1)
Basophils Relative: 0 %
Eosinophils Absolute: 0 10*3/uL (ref 0.0–0.5)
Eosinophils Relative: 0 %
HCT: 44 % (ref 39.0–52.0)
Hemoglobin: 13.5 g/dL (ref 13.0–17.0)
Immature Granulocytes: 0 %
Lymphocytes Relative: 3 %
Lymphs Abs: 0.3 10*3/uL — ABNORMAL LOW (ref 0.7–4.0)
MCH: 27.3 pg (ref 26.0–34.0)
MCHC: 30.7 g/dL (ref 30.0–36.0)
MCV: 89.1 fL (ref 80.0–100.0)
Monocytes Absolute: 0.7 10*3/uL (ref 0.1–1.0)
Monocytes Relative: 5 %
Neutro Abs: 11.8 10*3/uL — ABNORMAL HIGH (ref 1.7–7.7)
Neutrophils Relative %: 92 %
Platelet Count: 195 10*3/uL (ref 150–400)
RBC: 4.94 MIL/uL (ref 4.22–5.81)
RDW: 14.5 % (ref 11.5–15.5)
WBC Count: 12.8 10*3/uL — ABNORMAL HIGH (ref 4.0–10.5)
nRBC: 0 % (ref 0.0–0.2)

## 2021-06-30 LAB — CMP (CANCER CENTER ONLY)
ALT: 13 U/L (ref 0–44)
AST: 18 U/L (ref 15–41)
Albumin: 3.6 g/dL (ref 3.5–5.0)
Alkaline Phosphatase: 63 U/L (ref 38–126)
Anion gap: 7 (ref 5–15)
BUN: 28 mg/dL — ABNORMAL HIGH (ref 8–23)
CO2: 38 mmol/L — ABNORMAL HIGH (ref 22–32)
Calcium: 9.1 mg/dL (ref 8.9–10.3)
Chloride: 93 mmol/L — ABNORMAL LOW (ref 98–111)
Creatinine: 0.79 mg/dL (ref 0.61–1.24)
GFR, Estimated: >60 mL/min (ref >60)
Glucose, Bld: 142 mg/dL — ABNORMAL HIGH (ref 70–99)
Potassium: 4.9 mmol/L (ref 3.5–5.1)
Sodium: 138 mmol/L (ref 135–145)
Total Bilirubin: 0.9 mg/dL (ref 0.3–1.2)
Total Protein: 7.6 g/dL (ref 6.5–8.1)

## 2021-06-30 NOTE — Telephone Encounter (Signed)
Devki, Dr. Silas Flood would like to see if we can get patient approved for Tobramycin nebulizer one month on/ one month off for hx bronchiectasis with pseudomonas. Can you help with this?

## 2021-06-30 NOTE — Telephone Encounter (Signed)
Submitted an URGENT Prior Authorization request to [facility] for  tobramycin inhalation solution  via CoverMyMeds. Will update once we receive a response.  Key: [id number]  Knox Saliva, PharmD, MPH, BCPS Clinical Pharmacist (Rheumatology and Pulmonology)

## 2021-06-30 NOTE — Progress Notes (Signed)
Please let patient know CXR showed improvement since hospitalization but worse compared to 2021, I had Dr. Silas Flood look at it as well. He did not recommend using Levaquin as rotating abx as it is our only option when he has a flare and he dont want him to develop resistance to this. We are going to look into starting him on nebulized abx called tobramycin which would be every other month

## 2021-07-01 ENCOUNTER — Other Ambulatory Visit (HOSPITAL_COMMUNITY): Payer: Self-pay

## 2021-07-01 MED ORDER — TOBRAMYCIN 300 MG/5ML IN NEBU: INHALATION_SOLUTION | RESPIRATORY_TRACT | 2 refills | Status: DC

## 2021-07-01 NOTE — Telephone Encounter (Signed)
Received notification from [facility] regarding a prior authorization for  tobramycin nebulizer . Authorization has been APPROVED from 06/30/21 to 06/30/22.   Unable to run test claim since patient must fill through [facility]: [phone].  Patient has Pharmacist, community and is eligible for copay card.  Authorization # [id number]  Knox Saliva, PharmD, MPH, BCPS Clinical Pharmacist (Rheumatology and Pulmonology)

## 2021-07-01 NOTE — Telephone Encounter (Signed)
Dr. Silas Flood, please advise if you are okay with this.

## 2021-07-01 NOTE — Telephone Encounter (Signed)
Called and spoke with [name] from [place] letting him know that Jeffrey Hines was fine providing verbal for the home health nursing and he verbalized understanding. Nothing further needed.

## 2021-07-01 NOTE — Telephone Encounter (Signed)
Very reasonable, happy to sign home health orders

## 2021-07-01 NOTE — Telephone Encounter (Signed)
Called and spoke with pt's spouse Clarene Critchley letting her know that message was received yesterday 1/25 by [employer] that they confirmed receiving the order that was placed and she verbalized understanding. Clarene Critchley stated that she would contact [employer] later today. Nothing further needed.

## 2021-07-01 NOTE — Telephone Encounter (Signed)
Called patient - spoke with his wife regarding dosing of tobramycin nebulizer (4 weeks on, 4 weeks off). She was provided with pharmacy phone number. Advised her to call tomorrow.  I also reviewed that I was unable to see how much medication would cost but that there are copay cards available for generics. If copay is too expensive, they should inquire online for copay options for the specific manufacturer that hte pharmacy is planning to dispense. His wife expressed understanding. Recommended she reach back to pharmacy team in-house at clinic for any further issues with scheduling shipment so we may help troubleshoot.  Knox Saliva, PharmD, MPH, BCPS Clinical Pharmacist (Rheumatology and Pulmonology)

## 2021-07-02 ENCOUNTER — Telehealth: Payer: Self-pay | Admitting: Pulmonary Disease

## 2021-07-02 DIAGNOSIS — J9622 Acute and chronic respiratory failure with hypercapnia: Secondary | ICD-10-CM | POA: Diagnosis not present

## 2021-07-02 DIAGNOSIS — J439 Emphysema, unspecified: Secondary | ICD-10-CM | POA: Diagnosis not present

## 2021-07-02 DIAGNOSIS — J9621 Acute and chronic respiratory failure with hypoxia: Secondary | ICD-10-CM | POA: Diagnosis not present

## 2021-07-02 DIAGNOSIS — J471 Bronchiectasis with (acute) exacerbation: Secondary | ICD-10-CM | POA: Diagnosis not present

## 2021-07-02 DIAGNOSIS — J189 Pneumonia, unspecified organism: Secondary | ICD-10-CM | POA: Diagnosis not present

## 2021-07-02 DIAGNOSIS — J470 Bronchiectasis with acute lower respiratory infection: Secondary | ICD-10-CM | POA: Diagnosis not present

## 2021-07-02 NOTE — Telephone Encounter (Signed)
Patient's wife came by the office and states that they were told by the [facility] that they are able to get the Tobramycin from them and the copay is only $8. They would like for the RX to be sent to them so that they are able to get it from them.   Will send to pharmacy staff to see if there is anything that needs to be done or if we can just send RX to the [place].

## 2021-07-02 NOTE — Telephone Encounter (Signed)
Called patient and told him that I would forward message to hunsucker to let him know the information and that if hunsucker had any questions or concerns I would call him back.  Hunsucker please advise

## 2021-07-02 NOTE — Telephone Encounter (Signed)
Thanks you both

## 2021-07-05 ENCOUNTER — Telehealth: Payer: Self-pay | Admitting: Pulmonary Disease

## 2021-07-05 MED ORDER — TOBRAMYCIN 300 MG/5ML IN NEBU: INHALATION_SOLUTION | RESPIRATORY_TRACT | 5 refills | Status: DC

## 2021-07-05 NOTE — Telephone Encounter (Signed)
OK with sending script for tobramycin nebs to [place]. Suspect elevated HR is related to need for oxygen. Recommend informing PCP to see if they want to get an EKG in their office.

## 2021-07-05 NOTE — Telephone Encounter (Signed)
Rx for tobramycin has been sent to [facility] for pt. Called and spoke with pt's spouse Clarene Critchley letting her know this had been done and she verbalized understanding.  Called and spoke with Marden Noble from [place] letting him know recs per [place] about pt's elevated HR and he verbalized understanding. Nothing further needed.

## 2021-07-05 NOTE — Telephone Encounter (Signed)
Spoke to pt's spouse Clarene Critchley letting her know that Rx for tobramycin was sent to [facility] for pt and she verbalized understanding. Nothing further needed.

## 2021-07-06 DIAGNOSIS — J471 Bronchiectasis with (acute) exacerbation: Secondary | ICD-10-CM | POA: Diagnosis not present

## 2021-07-06 DIAGNOSIS — J439 Emphysema, unspecified: Secondary | ICD-10-CM | POA: Diagnosis not present

## 2021-07-06 DIAGNOSIS — J9622 Acute and chronic respiratory failure with hypercapnia: Secondary | ICD-10-CM | POA: Diagnosis not present

## 2021-07-06 DIAGNOSIS — J189 Pneumonia, unspecified organism: Secondary | ICD-10-CM | POA: Diagnosis not present

## 2021-07-06 DIAGNOSIS — J9621 Acute and chronic respiratory failure with hypoxia: Secondary | ICD-10-CM | POA: Diagnosis not present

## 2021-07-06 DIAGNOSIS — J470 Bronchiectasis with acute lower respiratory infection: Secondary | ICD-10-CM | POA: Diagnosis not present

## 2021-07-07 ENCOUNTER — Ambulatory Visit (HOSPITAL_COMMUNITY)
Admission: RE | Admit: 2021-07-07 | Discharge: 2021-07-07 | Disposition: A | Discharge status: Home / Self Care | Payer: Medicare Other | Source: Ambulatory Visit | Attending: Physician Assistant | Admitting: Physician Assistant

## 2021-07-07 ENCOUNTER — Telehealth: Payer: Self-pay | Admitting: Pulmonary Disease

## 2021-07-07 DIAGNOSIS — R911 Solitary pulmonary nodule: Secondary | ICD-10-CM | POA: Insufficient documentation

## 2021-07-07 DIAGNOSIS — J9819 Other pulmonary collapse: Secondary | ICD-10-CM | POA: Diagnosis not present

## 2021-07-07 DIAGNOSIS — R918 Other nonspecific abnormal finding of lung field: Secondary | ICD-10-CM | POA: Diagnosis not present

## 2021-07-07 DIAGNOSIS — C3490 Malignant neoplasm of unspecified part of unspecified bronchus or lung: Secondary | ICD-10-CM | POA: Insufficient documentation

## 2021-07-07 LAB — GLUCOSE, CAPILLARY: Glucose-Capillary: 152 mg/dL — ABNORMAL HIGH (ref 70–99)

## 2021-07-07 MED ORDER — TOBRAMYCIN 300 MG/5ML IN NEBU: INHALATION_SOLUTION | RESPIRATORY_TRACT | 5 refills | Status: DC

## 2021-07-07 MED ORDER — FLUDEOXYGLUCOSE F - 18 (FDG) INJECTION
8.5200 | Freq: Once | INTRAVENOUS | Status: AC
  Administered 2021-07-07: 8.52 via INTRAVENOUS

## 2021-07-07 NOTE — Telephone Encounter (Signed)
Spoke with the pt's spouse  Rx we sent electronically was not received  I advised will fax this to the number she gave [name]  Rx printed, signed and faxed  Nothing further needed

## 2021-07-08 ENCOUNTER — Other Ambulatory Visit: Payer: Self-pay | Admitting: Pulmonary Disease

## 2021-07-08 DIAGNOSIS — J9621 Acute and chronic respiratory failure with hypoxia: Secondary | ICD-10-CM | POA: Diagnosis not present

## 2021-07-08 DIAGNOSIS — J189 Pneumonia, unspecified organism: Secondary | ICD-10-CM | POA: Diagnosis not present

## 2021-07-08 DIAGNOSIS — J439 Emphysema, unspecified: Secondary | ICD-10-CM | POA: Diagnosis not present

## 2021-07-08 DIAGNOSIS — J9622 Acute and chronic respiratory failure with hypercapnia: Secondary | ICD-10-CM | POA: Diagnosis not present

## 2021-07-08 DIAGNOSIS — J470 Bronchiectasis with acute lower respiratory infection: Secondary | ICD-10-CM | POA: Diagnosis not present

## 2021-07-08 DIAGNOSIS — J471 Bronchiectasis with (acute) exacerbation: Secondary | ICD-10-CM | POA: Diagnosis not present

## 2021-07-08 MED ORDER — TOBRAMYCIN 300 MG/5ML IN NEBU: INHALATION_SOLUTION | RESPIRATORY_TRACT | 11 refills | Status: AC

## 2021-07-08 NOTE — Telephone Encounter (Signed)
Spoke with the pt's spouse  Labs, cxr, recent ov and rx for tobi was sent to the new fax number that was provided  Nothing further needed

## 2021-07-08 NOTE — Telephone Encounter (Signed)
Spoke with the pt's spouse  She is now requesting that we sent the rx for tobi to the [facility] in [place] Nothing further needed

## 2021-07-08 NOTE — Addendum Note (Signed)
Addended by: Rosana Berger on: 07/08/2021 03:18 PM   Modules accepted: Orders

## 2021-07-13 ENCOUNTER — Telehealth: Payer: Self-pay | Admitting: Pulmonary Disease

## 2021-07-13 DIAGNOSIS — J189 Pneumonia, unspecified organism: Secondary | ICD-10-CM | POA: Diagnosis not present

## 2021-07-13 DIAGNOSIS — J9622 Acute and chronic respiratory failure with hypercapnia: Secondary | ICD-10-CM | POA: Diagnosis not present

## 2021-07-13 DIAGNOSIS — J471 Bronchiectasis with (acute) exacerbation: Secondary | ICD-10-CM | POA: Diagnosis not present

## 2021-07-13 DIAGNOSIS — J9621 Acute and chronic respiratory failure with hypoxia: Secondary | ICD-10-CM | POA: Diagnosis not present

## 2021-07-13 DIAGNOSIS — J439 Emphysema, unspecified: Secondary | ICD-10-CM | POA: Diagnosis not present

## 2021-07-13 DIAGNOSIS — J470 Bronchiectasis with acute lower respiratory infection: Secondary | ICD-10-CM | POA: Diagnosis not present

## 2021-07-13 NOTE — Telephone Encounter (Signed)
Received verbally from patient to speak to wife, Clarene Critchley.  Clarene Critchley stated that it was mentioned during previous admission that patient would need a non-rebreather mask. Patient currently wears 6L cont.  Clarene Critchley is wanting to know which non-rebreather patient would need.   Dr. Silas Flood, please advise. Thanks

## 2021-07-14 ENCOUNTER — Telehealth: Payer: Self-pay | Admitting: Pulmonary Disease

## 2021-07-14 DIAGNOSIS — J470 Bronchiectasis with acute lower respiratory infection: Secondary | ICD-10-CM | POA: Diagnosis not present

## 2021-07-14 DIAGNOSIS — J9622 Acute and chronic respiratory failure with hypercapnia: Secondary | ICD-10-CM | POA: Diagnosis not present

## 2021-07-14 DIAGNOSIS — J439 Emphysema, unspecified: Secondary | ICD-10-CM | POA: Diagnosis not present

## 2021-07-14 DIAGNOSIS — J9621 Acute and chronic respiratory failure with hypoxia: Secondary | ICD-10-CM | POA: Diagnosis not present

## 2021-07-14 DIAGNOSIS — J189 Pneumonia, unspecified organism: Secondary | ICD-10-CM | POA: Diagnosis not present

## 2021-07-14 DIAGNOSIS — J471 Bronchiectasis with (acute) exacerbation: Secondary | ICD-10-CM | POA: Diagnosis not present

## 2021-07-14 NOTE — Telephone Encounter (Signed)
[name] from [place] states patient fell yesterday. [name] phone number is [phone].

## 2021-07-14 NOTE — Telephone Encounter (Signed)
There is only 1 non-rebreather mask. IF O2 stays above 88% on 6L via nasal cannula I do not see a need for a non-rebreather. Furthermore, would need 2 concentrators to achieve flow needed for non-rebreather to be effective. What is is O2 sat on 6L with exertion?

## 2021-07-14 NOTE — Telephone Encounter (Signed)
Called and left voicemail for patient to call office back in regards to Dr [facility] reply.
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
## 2021-07-15 NOTE — Telephone Encounter (Signed)
Called Clarene Critchley but she did not answer. Left message for her to call back.

## 2021-07-15 NOTE — Telephone Encounter (Signed)
Called spouse and there was no answer- LMTCB.

## 2021-07-15 NOTE — Telephone Encounter (Signed)
Clarene Critchley wife is returning phone call. Clarene Critchley phone number is [phone].

## 2021-07-16 DIAGNOSIS — J439 Emphysema, unspecified: Secondary | ICD-10-CM | POA: Diagnosis not present

## 2021-07-16 DIAGNOSIS — J189 Pneumonia, unspecified organism: Secondary | ICD-10-CM | POA: Diagnosis not present

## 2021-07-16 DIAGNOSIS — J9622 Acute and chronic respiratory failure with hypercapnia: Secondary | ICD-10-CM | POA: Diagnosis not present

## 2021-07-16 DIAGNOSIS — J470 Bronchiectasis with acute lower respiratory infection: Secondary | ICD-10-CM | POA: Diagnosis not present

## 2021-07-16 DIAGNOSIS — J471 Bronchiectasis with (acute) exacerbation: Secondary | ICD-10-CM | POA: Diagnosis not present

## 2021-07-16 DIAGNOSIS — J9621 Acute and chronic respiratory failure with hypoxia: Secondary | ICD-10-CM | POA: Diagnosis not present

## 2021-07-16 NOTE — Telephone Encounter (Signed)
April, have you seen any forms for this patient?

## 2021-07-16 NOTE — Telephone Encounter (Signed)
Had [place] sign all the forms this morning when he sat down this morning. And I have them filled out and ready to be faxed. They are in the box now.

## 2021-07-19 ENCOUNTER — Other Ambulatory Visit: Payer: Self-pay

## 2021-07-19 ENCOUNTER — Inpatient Hospital Stay: Payer: Medicare Other | Attending: Internal Medicine | Admitting: Internal Medicine

## 2021-07-19 VITALS — BP 99/76 | HR 108 | Temp 95.9°F | Resp 18 | Ht 71.0 in | Wt 158.5 lb

## 2021-07-19 DIAGNOSIS — J449 Chronic obstructive pulmonary disease, unspecified: Secondary | ICD-10-CM | POA: Insufficient documentation

## 2021-07-19 DIAGNOSIS — Z902 Acquired absence of lung [part of]: Secondary | ICD-10-CM | POA: Insufficient documentation

## 2021-07-19 DIAGNOSIS — Z85118 Personal history of other malignant neoplasm of bronchus and lung: Secondary | ICD-10-CM | POA: Insufficient documentation

## 2021-07-19 DIAGNOSIS — Z79899 Other long term (current) drug therapy: Secondary | ICD-10-CM | POA: Insufficient documentation

## 2021-07-19 DIAGNOSIS — R911 Solitary pulmonary nodule: Secondary | ICD-10-CM | POA: Diagnosis not present

## 2021-07-19 DIAGNOSIS — C3491 Malignant neoplasm of unspecified part of right bronchus or lung: Secondary | ICD-10-CM | POA: Diagnosis not present

## 2021-07-19 NOTE — Telephone Encounter (Signed)
Noted.   Thank you April.

## 2021-07-19 NOTE — Telephone Encounter (Signed)
Spoke to [place] with [place].  She stated that she has not received forms.  Mariann Laster would like forms faxed to her direct line at [phone]  April, can you assist with this?
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
## 2021-07-19 NOTE — Telephone Encounter (Signed)
Margie I had MH sign all the orders on Friday when we were here and I put them in our box for faxing. I can go look and see what the issue is. And I will call Mariann Laster to let her know

## 2021-07-20 ENCOUNTER — Telehealth: Payer: Self-pay | Admitting: Pulmonary Disease

## 2021-07-20 DIAGNOSIS — J439 Emphysema, unspecified: Secondary | ICD-10-CM | POA: Diagnosis not present

## 2021-07-20 DIAGNOSIS — J470 Bronchiectasis with acute lower respiratory infection: Secondary | ICD-10-CM | POA: Diagnosis not present

## 2021-07-20 DIAGNOSIS — J189 Pneumonia, unspecified organism: Secondary | ICD-10-CM | POA: Diagnosis not present

## 2021-07-20 DIAGNOSIS — J471 Bronchiectasis with (acute) exacerbation: Secondary | ICD-10-CM | POA: Diagnosis not present

## 2021-07-20 DIAGNOSIS — J9622 Acute and chronic respiratory failure with hypercapnia: Secondary | ICD-10-CM | POA: Diagnosis not present

## 2021-07-20 DIAGNOSIS — J9621 Acute and chronic respiratory failure with hypoxia: Secondary | ICD-10-CM | POA: Diagnosis not present

## 2021-07-20 NOTE — Telephone Encounter (Signed)
Contacted by Dr. Earlie Server.  New PET avid nodule.  Can we schedule expedited follow-up to discuss this and potential options.  We can discuss the Lasix dosing at that time.  What Lasix dose is he currently taking?

## 2021-07-21 ENCOUNTER — Other Ambulatory Visit: Payer: Self-pay | Admitting: Pulmonary Disease

## 2021-07-21 DIAGNOSIS — Z85118 Personal history of other malignant neoplasm of bronchus and lung: Secondary | ICD-10-CM | POA: Diagnosis not present

## 2021-07-21 DIAGNOSIS — Z7952 Long term (current) use of systemic steroids: Secondary | ICD-10-CM | POA: Diagnosis not present

## 2021-07-21 DIAGNOSIS — I10 Essential (primary) hypertension: Secondary | ICD-10-CM | POA: Diagnosis not present

## 2021-07-21 DIAGNOSIS — J439 Emphysema, unspecified: Secondary | ICD-10-CM | POA: Diagnosis not present

## 2021-07-21 DIAGNOSIS — G2581 Restless legs syndrome: Secondary | ICD-10-CM | POA: Diagnosis not present

## 2021-07-21 DIAGNOSIS — J189 Pneumonia, unspecified organism: Secondary | ICD-10-CM | POA: Diagnosis not present

## 2021-07-21 DIAGNOSIS — J9621 Acute and chronic respiratory failure with hypoxia: Secondary | ICD-10-CM | POA: Diagnosis not present

## 2021-07-21 DIAGNOSIS — Z902 Acquired absence of lung [part of]: Secondary | ICD-10-CM | POA: Diagnosis not present

## 2021-07-21 DIAGNOSIS — F411 Generalized anxiety disorder: Secondary | ICD-10-CM | POA: Diagnosis not present

## 2021-07-21 DIAGNOSIS — Z7901 Long term (current) use of anticoagulants: Secondary | ICD-10-CM | POA: Diagnosis not present

## 2021-07-21 DIAGNOSIS — Z9981 Dependence on supplemental oxygen: Secondary | ICD-10-CM | POA: Diagnosis not present

## 2021-07-21 DIAGNOSIS — J9622 Acute and chronic respiratory failure with hypercapnia: Secondary | ICD-10-CM | POA: Diagnosis not present

## 2021-07-21 DIAGNOSIS — J471 Bronchiectasis with (acute) exacerbation: Secondary | ICD-10-CM | POA: Diagnosis not present

## 2021-07-21 DIAGNOSIS — J470 Bronchiectasis with acute lower respiratory infection: Secondary | ICD-10-CM | POA: Diagnosis not present

## 2021-07-21 DIAGNOSIS — Z9181 History of falling: Secondary | ICD-10-CM | POA: Diagnosis not present

## 2021-07-21 DIAGNOSIS — I700 Atherosclerosis of aorta: Secondary | ICD-10-CM | POA: Diagnosis not present

## 2021-07-21 NOTE — Telephone Encounter (Signed)
Next available is when patient is scheduled 08/12/2021. Called patient and told patient that we will discuss lasix with him at this visit. Patient had no questions or concerns note. Nothing further needed at this time

## 2021-07-23 ENCOUNTER — Telehealth: Payer: Self-pay | Admitting: Pulmonary Disease

## 2021-07-23 NOTE — Telephone Encounter (Signed)
Called and spoke with Clarene Critchley about the information stated by [facility] about nonrebreather mask. Clarene Critchley stated that since pt has been on the tobramycin, he has been doing better. States that at times pt's sats do drop down to 78% but when pt sits down, pt recovers within 3 minutes back up to stable range.  Clarene Critchley stated that even at night, pt's sats will drop to the 70s as pt is a mouth breather but when this happens, pt will wake up due to this and when he wakes up, pt is able to get his sats up to stable range. Clarene Critchley stated that she is comfortable with how things are going and stated that she would further discuss all this with [place] at upcoming [place] 3/9. Nothing further needed.

## 2021-07-23 NOTE — Telephone Encounter (Signed)
Called and spoke with Jeffrey Hines. She stated that she was calling to check on the status of the home health orders and plan of care forms that she had faxed over to our office. She stated that they will need the paperwork ASAP as the paperwork is now late. She has faxed a fresh copy to our office in the event we are not able to find the signed forms. She wishes to have them faxed directly to her at [phone].   I found the forms in [facility] faxed papers. I have faxed them back to [place]. Will leave this encounter open to document the fax confirmation.

## 2021-07-26 NOTE — Telephone Encounter (Signed)
Called and spoke to Jeffrey Hines and she states that once again she did not receive the fax from [facility]. So Jeffrey Hines refaxed the orders that she needs to be signed and fixed. Will have [place] sign tomorrow when in office and will refax back to [place]

## 2021-07-27 DIAGNOSIS — J9622 Acute and chronic respiratory failure with hypercapnia: Secondary | ICD-10-CM | POA: Diagnosis not present

## 2021-07-27 DIAGNOSIS — J9621 Acute and chronic respiratory failure with hypoxia: Secondary | ICD-10-CM | POA: Diagnosis not present

## 2021-07-27 DIAGNOSIS — J439 Emphysema, unspecified: Secondary | ICD-10-CM | POA: Diagnosis not present

## 2021-07-27 DIAGNOSIS — J189 Pneumonia, unspecified organism: Secondary | ICD-10-CM | POA: Diagnosis not present

## 2021-07-27 DIAGNOSIS — J471 Bronchiectasis with (acute) exacerbation: Secondary | ICD-10-CM | POA: Diagnosis not present

## 2021-07-27 DIAGNOSIS — J470 Bronchiectasis with acute lower respiratory infection: Secondary | ICD-10-CM | POA: Diagnosis not present

## 2021-07-28 DIAGNOSIS — J471 Bronchiectasis with (acute) exacerbation: Secondary | ICD-10-CM | POA: Diagnosis not present

## 2021-07-28 DIAGNOSIS — J9622 Acute and chronic respiratory failure with hypercapnia: Secondary | ICD-10-CM | POA: Diagnosis not present

## 2021-07-28 DIAGNOSIS — J439 Emphysema, unspecified: Secondary | ICD-10-CM | POA: Diagnosis not present

## 2021-07-28 DIAGNOSIS — J189 Pneumonia, unspecified organism: Secondary | ICD-10-CM | POA: Diagnosis not present

## 2021-07-28 DIAGNOSIS — J470 Bronchiectasis with acute lower respiratory infection: Secondary | ICD-10-CM | POA: Diagnosis not present

## 2021-07-28 DIAGNOSIS — J9621 Acute and chronic respiratory failure with hypoxia: Secondary | ICD-10-CM | POA: Diagnosis not present

## 2021-07-29 DIAGNOSIS — J470 Bronchiectasis with acute lower respiratory infection: Secondary | ICD-10-CM | POA: Diagnosis not present

## 2021-07-29 DIAGNOSIS — J471 Bronchiectasis with (acute) exacerbation: Secondary | ICD-10-CM | POA: Diagnosis not present

## 2021-07-29 DIAGNOSIS — J9622 Acute and chronic respiratory failure with hypercapnia: Secondary | ICD-10-CM | POA: Diagnosis not present

## 2021-07-29 DIAGNOSIS — J439 Emphysema, unspecified: Secondary | ICD-10-CM | POA: Diagnosis not present

## 2021-07-29 DIAGNOSIS — J189 Pneumonia, unspecified organism: Secondary | ICD-10-CM | POA: Diagnosis not present

## 2021-07-29 DIAGNOSIS — J9621 Acute and chronic respiratory failure with hypoxia: Secondary | ICD-10-CM | POA: Diagnosis not present

## 2021-08-11 DIAGNOSIS — J9622 Acute and chronic respiratory failure with hypercapnia: Secondary | ICD-10-CM | POA: Diagnosis not present

## 2021-08-11 DIAGNOSIS — J189 Pneumonia, unspecified organism: Secondary | ICD-10-CM | POA: Diagnosis not present

## 2021-08-11 DIAGNOSIS — J471 Bronchiectasis with (acute) exacerbation: Secondary | ICD-10-CM | POA: Diagnosis not present

## 2021-08-11 DIAGNOSIS — J439 Emphysema, unspecified: Secondary | ICD-10-CM | POA: Diagnosis not present

## 2021-08-11 DIAGNOSIS — J9621 Acute and chronic respiratory failure with hypoxia: Secondary | ICD-10-CM | POA: Diagnosis not present

## 2021-08-11 DIAGNOSIS — J470 Bronchiectasis with acute lower respiratory infection: Secondary | ICD-10-CM | POA: Diagnosis not present

## 2021-08-12 ENCOUNTER — Ambulatory Visit (INDEPENDENT_AMBULATORY_CARE_PROVIDER_SITE_OTHER): Payer: Medicare Other | Admitting: Pulmonary Disease

## 2021-08-12 ENCOUNTER — Other Ambulatory Visit: Payer: Self-pay

## 2021-08-12 ENCOUNTER — Encounter: Payer: Self-pay | Admitting: Pulmonary Disease

## 2021-08-12 VITALS — BP 122/70 | HR 104 | Temp 98.8°F | Ht 71.0 in | Wt 158.0 lb

## 2021-08-12 DIAGNOSIS — J9611 Chronic respiratory failure with hypoxia: Secondary | ICD-10-CM | POA: Diagnosis not present

## 2021-08-12 MED ORDER — OXYCODONE HCL 5 MG PO TABS: 2.5000 mg | ORAL_TABLET | Freq: Three times a day (TID) | ORAL | 0 refills | Status: AC | PRN

## 2021-08-12 NOTE — Patient Instructions (Addendum)
Nice to see you again ? ?For the pain, place salonpas lidocaine patches over-the-counter to the affected area. Place on in morning and off in evening, use no more than 2 a day. ? ?I prescribed oxycodone 2.5 mg (1/2 table) every 8 hours as needed for pain.  ? ?We will send a new order for a 40% venti mask. ? ?Return to clinic in 3 months or sooner as needed ?

## 2021-08-13 NOTE — Progress Notes (Signed)
@Patient  ID: Jeffrey Hines, male    DOB: [birth year], 76 y.o.   MRN: [id number]  Chief Complaint  Patient presents with   Follow-up    Follow up. Pt states he is doing ok. Pt states his muscles in his chest and bak just hurt. He states it hurts to cough or anything. Been going on for about 2 weeks. Complaining more since the ABT stopped.     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   Since last visit seen by Jeffrey Reading NP, note reviewed.  That visit was hospital follow-up following admission for worsening hypoxemia and dense left pneumonia.  Chest x-ray and CT scan reviewed and interpreted as:  Previously small area of aeration on the left now consolidated, appears to be fibrosing.  He completed a course of antibiotics.  We worked and obtained nebulized tobramycin given Pseudomonas history.  He completed 1 month of this a few days ago.  Failure did help his cough, decrease the burden the frequency of sputum production.  Over the last week or so since being off seems like it may be picking up a little bit more, little bit worse.  He has several questions.  He has pain on his left lower back and left anterior chest.  Anterior chest pain is chronic in nature.  Low back is new.  Feels like he cannot cough well and mucus builds up because of the pain.  Also discussed right middle lobe nodules been followed for some time.  Has a history of lung cancer, scans per Jeffrey Hines.  Discussed at length the likelihood given the severity of respiratory failure treatment of this nodule or presumed malignancy is unlikely to affect the quantity of his life.  Also discussed using Ventimask at night since he is a mouth breather.  Questionaires / Pulmonary  Flowsheets:   ACT:  No flowsheet data found.  MMRC: No flowsheet data found.  Epworth:  No flowsheet data found.  Tests:   FENO:  No results found for: NITRICOXIDE  PFT: PFT Results Latest Ref Rng & Units 11/07/2014 08/07/2014  FVC-Pre L 4.89 2.93  FVC-Predicted Pre % 104 62  FVC-Post L 5.38 3.44  FVC-Predicted Post % 115 73  Pre FEV1/FVC % % 46 49  Post FEV1/FCV % % 46 50  FEV1-Pre L 2.24 1.44  FEV1-Predicted Pre % 64 41  FEV1-Post L 2.47 1.71  DLCO uncorrected ml/min/mmHg 10.62 11.13  DLCO UNC% % 31 33  DLCO corrected ml/min/mmHg 11.70 11.29  DLCO COR %Predicted % 34 33  DLVA Predicted % 40 55  TLC L 7.79 6.73  TLC % Predicted % 107 93  RV % Predicted % 101 131  Personally reviewed and interpreted as moderate to severe obstruction with severely reduced DLCO  WALK:  SIX MIN WALK 04/28/2015 10/28/2014  Supplimental Oxygen during Test? (L/min) No No  Tech Comments: - pt walked a slow pace, tolerated walk well.     Imaging: Reviewed and as per EMR and discussion in this note  Lab Results: Extensively reviewed including multiple sputum and AFB cultures CBC    Component Value Date/Time   WBC 12.8 (H) 06/30/2021 1457   WBC 13.5 (H) 06/12/2021 0337   RBC 4.94 06/30/2021 1457   HGB 13.5 06/30/2021 1457  HGB 12.9 (L) 02/08/2017 1343   HCT 44.0 06/30/2021 1457   HCT 38.9 02/08/2017 1343   PLT 195 06/30/2021 1457   PLT 218 02/08/2017 1343   MCV 89.1 06/30/2021 1457   MCV 85.7 02/08/2017 1343   MCH 27.3 06/30/2021 1457   MCHC 30.7 06/30/2021 1457   RDW 14.5 06/30/2021 1457   RDW 12.8 02/08/2017 1343   LYMPHSABS 0.3 (L) 06/30/2021 1457   LYMPHSABS 0.9 02/08/2017 1343   MONOABS 0.7 06/30/2021 1457   MONOABS 0.5 02/08/2017 1343   EOSABS 0.0 06/30/2021 1457   EOSABS 0.1 02/08/2017 1343   BASOSABS 0.0 06/30/2021 1457   BASOSABS 0.0 02/08/2017 1343    BMET    Component Value Date/Time   NA 138 06/30/2021 1457   NA 142 02/08/2017 1344   K 4.9 06/30/2021 1457    K 3.5 02/08/2017 1344   CL 93 (L) 06/30/2021 1457   CO2 38 (H) 06/30/2021 1457   CO2 28 02/08/2017 1344   GLUCOSE 142 (H) 06/30/2021 1457   GLUCOSE 113 02/08/2017 1344   BUN 28 (H) 06/30/2021 1457   BUN 13.5 02/08/2017 1344   CREATININE 0.79 06/30/2021 1457   CREATININE 0.9 02/08/2017 1344   CALCIUM 9.1 06/30/2021 1457   CALCIUM 9.0 02/08/2017 1344   GFRNONAA >60 06/30/2021 1457   GFRAA >60 01/17/2020 1355    BNP    Component Value Date/Time   BNP 30.5 08/03/2014 1359    ProBNP    Component Value Date/Time   PROBNP 101.0 (H) 01/07/2020 1641    Specialty Problems       Pulmonary Problems   COPD with chronic bronchitis (Jeffrey Hines)    March 2016 pulmonary function test> ratio 50%, FEV1 L (49% predicted, 18% change with bronchodilator), total lung capacity 6.73 L (93% predicted), DLCO 11.13 (33% predicted). June 2016 pulmonary function testing ratio 46%, FEV1 2.47 L (71% Pred), total lung capacity 7.79 L (107% predicted), DLCO 10.62 (31% predicted). 11/2014 ONO < 88% 2 hours      Lung mass   Post-obstruction pneumonia due to Pseudomonas aeruginosa    Endobronchial mass causing LUL obstruction      Squamous cell carcinoma of lung, stage II (Jeffrey Hines)    - Status post left upper lobectomy summer 2016 - seen at Jeffrey Hines and underwent a bronchoscopy on March 16 2017 with a balloon dilation of the focal area of acquired bronchomalacia and stenosis of the left lower lobe orifice this was felt due to torsion of the lung post lobectomy       Chronic respiratory failure with hypoxia (Jeffrey Hines)    2L O2 qHS 05/2015 ONO RA> less than 88% 1 hour, 31 minutes, lowest 77%      Lung cancer (Jeffrey Hines)   Cough   Pneumonia due to Pseudomonas species (Jeffrey Hines)   Postobstructive pneumonia   Bronchiectasis with (acute) exacerbation (Jeffrey Hines)   Bronchiectasis without complication (Jeffrey Hines)   CAP (community acquired pneumonia)    Allergies  Allergen Reactions   Anoro Ellipta [Umeclidinium-Vilanterol]  Hives   Prolixin [Fluphenazine]     Severe back pain   Spiriva Respimat [Tiotropium Bromide Monohydrate] Other (See Comments)    unknown   Advair Diskus [Fluticasone-Salmeterol] Anxiety    Immunization History  Administered Date(s) Administered   Fluad Quad(high Dose 65+) 03/20/2020, 03/17/2021   Influenza Inj Mdck Quad Pf 03/01/2018   Influenza Split 05/27/2013, 02/25/2014, 04/14/2015   Influenza Whole 02/27/2014   Influenza, High Dose Seasonal PF 06/02/2016, 03/06/2017, 04/06/2017, 03/01/2018, 02/19/2019  Influenza,inj,quad, With Preservative 02/18/2019   Influenza-Unspecified 03/25/1998, 04/19/1999, 05/06/2006, 02/26/2007, 04/07/2008, 03/17/2009, 04/05/2010, 05/06/2011, 06/14/2012, 02/25/2014, 04/13/2015, 12/27/2019   Moderna Covid-19 Vaccine Bivalent Booster 58yrs & up 04/23/2021   Moderna Sars-Cov-2 Peds vaccine 52yrs thru 20yrs 04/23/2021   Moderna Sars-Covid-2 Vaccination 07/09/2019, 08/06/2019, 09/17/2020   Pneumococcal Conjugate-13 06/12/2014, 04/14/2015   Pneumococcal Polysaccharide-23 05/03/2016, 05/24/2016   Pneumococcal-Unspecified 04/19/1999   Tetanus 06/14/2012   Zoster Recombinat (Shingrix) 12/27/2019, 02/27/2020    Past Medical History:  Diagnosis Date   Anxiety    Cancer ([place])    Chronic lower back pain    Closed head injury 1998   Constipation due to opioid therapy    COPD (chronic obstructive pulmonary disease) ([place])    COPD with chronic bronchitis (Jeffrey Hines)    Full dentures    GERD (gastroesophageal reflux disease)    Hemoptysis 08/03/2014   Hypertension    Multiple rib fractures 1998   left side   On supplemental oxygen therapy    2L of O2 at night   Post-obstruction pneumonia due to Pseudomonas aeruginosa 08/07/2014   Endobronchial mass causing LUL obstruction   Radiation 08/25/14-09/29/14   left upper central lung 45 Gy   Seizures (Jeffrey Hines)    Shortness of breath dyspnea    Shoulder dislocation 1998   left   Spontaneous pneumothorax 08/03/2014   Left  side 1st time episode spontaneous pneumothorax associated with acute flare of COPD    Tobacco abuse     Tobacco History: Social History   Tobacco Use  Smoking Status Former   Packs/day: 0.50   Years: 50.00   Pack years: 25.00   Types: Cigarettes   Quit date: 08/03/2014   Years since quitting: 7.0  Smokeless Tobacco Never   Counseling given: Not Answered   Continue to not smoke  Outpatient Encounter Medications as of 08/12/2021  Medication Sig   acetaminophen (TYLENOL) 500 MG tablet Take 1,000 mg by mouth every 8 (eight) hours as needed for moderate pain.   albuterol (PROVENTIL) (2.5 MG/3ML) 0.083% nebulizer solution Take 3 mLs (2.5 mg total) by nebulization every 2 (two) hours as needed for wheezing or shortness of breath.   albuterol (VENTOLIN HFA) 108 (90 Base) MCG/ACT inhaler Inhale 2 puffs into the lungs every 6 (six) hours as needed for wheezing or shortness of breath.   apixaban (ELIQUIS) 5 MG TABS tablet Take 1 tablet (5 mg total) by mouth 2 (two) times daily.   azelastine (ASTELIN) 0.1 % nasal spray Place 1 spray into the nose 2 (two) times daily as needed for rhinitis.   BL GLUCOSAMINE-CHONDROITIN PO Take 1 tablet by mouth daily.   clonazePAM (KLONOPIN) 0.5 MG tablet Take 0.5 mg by mouth 2 (two) times daily.    fluticasone (FLONASE) 50 MCG/ACT nasal spray Place 1 spray into both nostrils daily. (Patient taking differently: Place 1 spray into both nostrils daily as needed for allergies.)   furosemide (LASIX) 20 MG tablet Take 1 tablet (20 mg total) by mouth daily. (Patient taking differently: Take 20-40 mg by mouth See admin instructions. 20 mg on Monday Wednesday Thursday and Saturdays 40 mg Tuesday Friday and Sunday)   guaiFENesin (MUCINEX) 600 MG 12 hr tablet Take 1 tablet (600 mg total) by mouth 2 (two) times daily. (Patient taking differently: Take 400 mg by mouth 2 (two) times daily. Gets 400 mg tablet from [place])   Loratadine-Pseudoephedrine (CLARITIN-D 12 HOUR PO) Take 1  tablet by mouth 2 (two) times daily.   Misc Natural Products (OSTEO BI-FLEX  ADV JOINT SHIELD PO) Take 1 tablet by mouth daily.   Multiple Vitamins-Minerals (PRESERVISION AREDS 2 PO) Take 1 capsule by mouth 2 (two) times daily.   oxyCODONE (OXY IR/ROXICODONE) 5 MG immediate release tablet Take 0.5 tablets (2.5 mg total) by mouth every 8 (eight) hours as needed for severe pain or moderate pain.   OXYGEN Inhale 5 L/min into the lungs continuous.   polyethylene glycol (MIRALAX / GLYCOLAX) 17 g packet Take 17 g by mouth daily as needed for mild constipation.   predniSONE (DELTASONE) 20 MG tablet TAKE 1 TABLET BY MOUTH DAILY WITH BREAKFAST   Respiratory Therapy Supplies (FLUTTER) DEVI 1 Device by Does not apply route as directed.   rOPINIRole (REQUIP) 1 MG tablet Take 2 mg by mouth at bedtime.   Saw Palmetto 450 MG CAPS Take 450-900 mg by mouth See admin instructions. Takes 900 mg in the morning and 450 mg at night.   sodium chloride HYPERTONIC 3 % nebulizer solution Take 4 mLs by nebulization 2 (two) times daily as needed for cough.    tamsulosin (FLOMAX) 0.4 MG CAPS capsule 0.4 mg daily.   Tiotropium Bromide-Olodaterol (STIOLTO RESPIMAT) 2.5-2.5 MCG/ACT AERS Inhale 2 puffs into the lungs daily.    tobramycin, PF, (TOBI) 300 MG/5ML nebulizer solution Inhale one ampule via nebulizer twice daily for 4 weeks. Then hold for 4 weeks. 4 Weeks on , 4 weeks off   [DISCONTINUED] Turmeric 500 MG CAPS Take 1,000 mg by mouth in the morning and at bedtime.   No facility-administered encounter medications on file as of 08/12/2021.      Physical Exam  BP 122/70 (BP Location: Left Arm, Patient Position: Sitting, Cuff Size: Normal)    Pulse (!) 104    Temp 98.8 F (37.1 C) (Oral)    Ht 5\' 11"  (1.803 m)    Wt 158 lb (71.7 kg)    SpO2 100%    BMI 22.04 kg/m   Wt Readings from Last 5 Encounters:  08/12/21 158 lb (71.7 kg)  07/19/21 158 lb 8 oz (71.9 kg)  06/30/21 160 lb 1.6 oz (72.6 kg)  06/28/21 165 lb (74.8  kg)  06/09/21 154 lb 9.6 oz (70.1 kg)    BMI Readings from Last 5 Encounters:  08/12/21 22.04 kg/m  07/19/21 22.11 kg/m  06/30/21 22.33 kg/m  06/28/21 23.01 kg/m  06/09/21 21.56 kg/m     Physical Exam Gen: chronically ill appearing, in NAD Respiratory: Inspiratory crackles low to mid lung fields,  mild increased work of breathing on 6L [place] CV: tachy, regular rhythm   Assessment & Plan:   Bronchiectasis with acute exacerbation: Recurrent growth of Pseudomonas which had grown in the past.  Occasional odd NTM grown in culture but smear negative.  Recent hospitalization for exacerbation.  Gradual incline and his functional capacity as well is worsened hypoxemia. --Continue HTS 3 times daily, albuterol BID, fluter valve BID --Alternating months of nebulized tobramycin and off, currently off in March  Severe COPD: Related to cigarette use and bronchiectasis.  --Continue stiolto for dual bronchodilation, tolerate perceived side effects of low back pain, consider nebulized LAMA/LABA in future ([facility] benefit) --Prednisone 20 mg daily, did not tolerate recent attempt to taper, consider retrial in the future --Encouraged pulmonary rehab  Chronic hypoxemic respiratory failure: Using 6 L from 4 to 5 L after recent hospitalization 06/2021 for exacerbation of lung disease.  Related to above.  Trial of Ventimask while sleeping and during the day to see if it helps.  Nasal  congestion/chronic sinusitis: Ongoing nasal congestion, rhinorrhea, postnasal drip. --Continue antihistamine, azelastine, Flonase  Low back pain: Trial of Salonpas, small amount of oxycodone, 2.5 mg every 8 hours as needed prescribed.  May also help with dyspnea.   Return in about 3 months (around 11/12/2021).   Lanier Clam, MD 08/13/2021   This appointment required 45 minutes of patient care (this includes precharting, chart review, review of results, face-to-face care, etc.).

## 2021-08-17 ENCOUNTER — Inpatient Hospital Stay: Payer: Medicare Other

## 2021-08-17 ENCOUNTER — Inpatient Hospital Stay: Payer: Medicare Other | Admitting: Internal Medicine

## 2021-08-17 ENCOUNTER — Telehealth: Payer: Self-pay | Admitting: Internal Medicine

## 2021-08-17 NOTE — Telephone Encounter (Signed)
.  Called patient to schedule appointment per 3/13 inbasket, patient wife is aware of date and time.   ?

## 2021-08-20 NOTE — Progress Notes (Signed)
[place] ?HEMATOLOGY-ONCOLOGY TeleHEALTH VISIT PROGRESS NOTE  ? ?I connected with Jeffrey Hines on 08/23/21 at  3:30 PM EDT by telephone and verified that I am speaking with the correct person using two identifiers.  ?I discussed the limitations, risks, security and privacy concerns of performing an evaluation and management service by telemedicine and the availability of in-person appointments. I also discussed with the patient that there may be a patient responsible charge related to this service. The patient expressed understanding and agreed to proceed.  ?Other persons participating in the visit and their role in the encounter: Blas Riches (wife) ?Patient?s location: Home   ?Provider?s location: office  ? ?Clinic, [facility] ?[address] ?[place] [address] ? ?DIAGNOSIS: Malignant neoplasm of upper lobe of left lung ?  Staging form: Lung, AJCC 6th Edition ?    Clinical: No stage assigned - Unsigned ?Squamous cell carcinoma of lung, stage II ?  Staging form: Lung, AJCC 6th Edition ?    Clinical stage from 08/14/2014: Stage IIB (T3, N0, M0) - Signed by Curt Bears, MD on 08/14/2014 ?      Staging comments: Squamous cell carcinoma ? ?PRIOR THERAPY: ?1) Concurrent chemoradiation with chemotherapy the form of weekly carboplatin for AUC 2 and  paclitaxel at 45 mg/m?. Status post 5 cycles of chemotherapy last dose was given 09/22/2014 with partial response. ?2) Left video-assisted thoracoscopy, wedge resection of left lower lobe and thoracoscopic left upper lobectomy with mediastinal lymph node dissection under the care of Dr. Roxan Hockey on 12/15/2014.  ? ?CURRENT THERAPY: Observation.  Patient referred to radiation oncology for consideration of SBRT to the new right middle lobe pulmonary nodule. ? ?INTERVAL HISTORY: ?Jeffrey Hines 76 y.o. male and I connected via telephone visit today. The patient was last seen in the clinic on 07/19/2021. In January, the  patient presented to the emergency room on 06/09/21 for the chief complaint of shortness of breath.  The patient has chronic hypoxia and is on on 4 L of supplemental oxygen at baseline.  He is oxygen and steroid-dependent due to his COPD.  He had a CT angiogram which could not rule out pulmonary embolism and he was started on anticoagulation.  He is currently taking Eliquis.  He was also treated with antibiotics for bronchiectasis flare.  He is following with pulmonary medicine due to his significant lung disease.  ? ?During the work-up for the shortness of breath, the CT scan showed interval increase in the size of the right middle lobe/perihilar nodule compared to November 2021 concerning for neoplastic process.  ? ?Therefore, at his last appointment he had a repeat PET scsn to further evaluate this. His scan showed hypermetabolic nodule in the right middle lobe consistent with bronchogenic carcinoma (primary or metastatic).  There was also new peripheral subpleural and parenchymal nodularities in the right middle lobe new from the previous CT scan of the chest and favored to be infectious.  There was complete opacification of the left lung with air bronchograms and mucoid material within the left mainstem bronchus but no focal activity in the left hilum or lung to suggest malignant process.  The scan also showed no hypermetabolic mediastinal lymphadenopathy or distant metastatic disease. ? ?Dr. Julien Nordmann recommended his pulmonologist, Dr. Silas Flood, for evaluation and consideration of bronchoscopy if possible to confirm his tissue diagnosis of the right middle lobe lung nodule.  If the patient is not a great candidate for repeat biopsy, Dr. Julien Nordmann would consider referring him to radiation  oncology for palliative SBRT to the hypermetabolic nodule based on the imaging characteristic.  ? ?The patient saw Dr. Silas Flood on 08/12/21.  The patient's wife reported that Dr. Silas Flood did not think the patient was a candidate  for repeat bronchoscopy and was too high risk due to his bronchiectasis.  ? ? ?Overall, the patient is having some worsening cough producing green sputum.  The patient's wife reported that they called pulmonology for recommendations regarding his tobramycin.  He has chronic shortness of breath and is on supplemental oxygen.  He denies any fever, chills, or night sweats.  Denies recent unexplained weight loss. Denies any chest discomfort or hemoptysis.  Denies any nausea or vomiting. He struggles chronically with constipation and denies anything new. Denies any headache or visual changes. The patient is here today for evaluation and to review the recent scan results.  ? ? ? ?MEDICAL HISTORY: ?Past Medical History:  ?Diagnosis Date  ? Anxiety   ? Cancer [facility])   ? Chronic lower back pain   ? Closed head injury 1998  ? Constipation due to opioid therapy   ? COPD (chronic obstructive pulmonary disease) ([place])   ? COPD with chronic bronchitis ([place])   ? Full dentures   ? GERD (gastroesophageal reflux disease)   ? Hemoptysis 08/03/2014  ? Hypertension   ? Multiple rib fractures 1998  ? left side  ? On supplemental oxygen therapy   ? 2L of O2 at night  ? Post-obstruction pneumonia due to Pseudomonas aeruginosa 08/07/2014  ? Endobronchial mass causing LUL obstruction  ? Radiation 08/25/14-09/29/14  ? left upper central lung 45 Gy  ? Seizures ([place])   ? Shortness of breath dyspnea   ? Shoulder dislocation 1998  ? left  ? Spontaneous pneumothorax 08/03/2014  ? Left side 1st time episode spontaneous pneumothorax associated with acute flare of COPD   ? Tobacco abuse   ? ? ?ALLERGIES:  is allergic to anoro ellipta [umeclidinium-vilanterol], prolixin [fluphenazine], spiriva respimat [tiotropium bromide monohydrate], and advair diskus [fluticasone-salmeterol]. ? ?MEDICATIONS:  ?Current Outpatient Medications  ?Medication Sig Dispense Refill  ? acetaminophen (TYLENOL) 500 MG tablet Take 1,000 mg by mouth every 8 (eight) hours as needed for  moderate pain.    ? albuterol (PROVENTIL) (2.5 MG/3ML) 0.083% nebulizer solution Take 3 mLs (2.5 mg total) by nebulization every 2 (two) hours as needed for wheezing or shortness of breath. 75 mL 12  ? albuterol (VENTOLIN HFA) 108 (90 Base) MCG/ACT inhaler Inhale 2 puffs into the lungs every 6 (six) hours as needed for wheezing or shortness of breath.    ? apixaban (ELIQUIS) 5 MG TABS tablet Take 1 tablet (5 mg total) by mouth 2 (two) times daily. 180 tablet 0  ? azelastine (ASTELIN) 0.1 % nasal spray Place 1 spray into the nose 2 (two) times daily as needed for rhinitis.    ? BL GLUCOSAMINE-CHONDROITIN PO Take 1 tablet by mouth daily.    ? clonazePAM (KLONOPIN) 0.5 MG tablet Take 0.5 mg by mouth 2 (two) times daily.     ? fluticasone (FLONASE) 50 MCG/ACT nasal spray Place 1 spray into both nostrils daily. (Patient taking differently: Place 1 spray into both nostrils daily as needed for allergies.) 16 g 2  ? furosemide (LASIX) 20 MG tablet Take 1 tablet (20 mg total) by mouth daily. (Patient taking differently: Take 20-40 mg by mouth See admin instructions. 20 mg on Monday Wednesday Thursday and Saturdays ?40 mg Tuesday Friday and Sunday) 30 tablet 0  ?  guaiFENesin (MUCINEX) 600 MG 12 hr tablet Take 1 tablet (600 mg total) by mouth 2 (two) times daily. (Patient taking differently: Take 400 mg by mouth 2 (two) times daily. Gets 400 mg tablet from [place]) 60 tablet 0  ? Loratadine-Pseudoephedrine (CLARITIN-D 12 HOUR PO) Take 1 tablet by mouth 2 (two) times daily.    ? Misc Natural Products (OSTEO BI-FLEX ADV JOINT SHIELD PO) Take 1 tablet by mouth daily.    ? Multiple Vitamins-Minerals (PRESERVISION AREDS 2 PO) Take 1 capsule by mouth 2 (two) times daily.    ? oxyCODONE (OXY IR/ROXICODONE) 5 MG immediate release tablet Take 0.5 tablets (2.5 mg total) by mouth every 8 (eight) hours as needed for severe pain or moderate pain. 30 tablet 0  ? OXYGEN Inhale 5 L/min into the lungs continuous.    ? polyethylene glycol (MIRALAX /  GLYCOLAX) 17 g packet Take 17 g by mouth daily as needed for mild constipation. 14 each 0  ? predniSONE (DELTASONE) 20 MG tablet TAKE 1 TABLET BY MOUTH DAILY WITH BREAKFAST 30 tablet 4  ? Respiratory

## 2021-08-23 ENCOUNTER — Inpatient Hospital Stay: Payer: Medicare Other

## 2021-08-23 ENCOUNTER — Telehealth: Payer: Self-pay | Admitting: Pulmonary Disease

## 2021-08-23 ENCOUNTER — Inpatient Hospital Stay: Payer: Medicare Other | Attending: Internal Medicine | Admitting: Physician Assistant

## 2021-08-23 ENCOUNTER — Other Ambulatory Visit: Payer: Self-pay

## 2021-08-23 DIAGNOSIS — J471 Bronchiectasis with (acute) exacerbation: Secondary | ICD-10-CM

## 2021-08-23 DIAGNOSIS — C3491 Malignant neoplasm of unspecified part of right bronchus or lung: Secondary | ICD-10-CM

## 2021-08-23 DIAGNOSIS — R911 Solitary pulmonary nodule: Secondary | ICD-10-CM | POA: Diagnosis not present

## 2021-08-23 NOTE — Telephone Encounter (Signed)
Spoke with Clarene Critchley who states pt is requesting to restart Tobramycin nebulizer earlier than planned 09/03/2021 r/t increased green thick sputum.  ? ?Dr. Silas Flood please advise  ?

## 2021-08-23 NOTE — Telephone Encounter (Signed)
That is fine 

## 2021-08-23 NOTE — Telephone Encounter (Signed)
Called and spoke to patient's wife. She stated that they needed the nebulizer supplies. I have sent in a order to [facility] for nebulizer supplies.  ?

## 2021-08-24 ENCOUNTER — Telehealth: Payer: Self-pay | Admitting: Pulmonary Disease

## 2021-08-24 NOTE — Telephone Encounter (Signed)
Would recommend 4 weeks on and 4 weeks off. If after next cycle symptoms are worsened before next 4 week cycle will re-consider doing every month. Can have side effect of decreased hearing if used all the time that I would like to avoid if able.

## 2021-08-24 NOTE — Telephone Encounter (Signed)
Called and spoke to pt' wife, Clarene Critchley. She states she reordered the tobramycin online with [facility] but she is unsure if this is coming [facility]. I advised Clarene Critchley I would call [facility] to assure the medication is being processed. However, she questioned if the Tobramycin should be continuous instead of 4 weeks on 4 weeks off, etc. Clarene Critchley stated Dr. Silas Flood mentioned this to them before and Clarene Critchley would like his recommendations before a new shipment has been sent out.  ? ? ?Dr. Silas Flood, please advise if you would like to continue with the previous frequency of 4 weeks on/4 weeks off OR change to continuous, the wife is requesting your thoughts. Thanks.  ?

## 2021-08-26 NOTE — Telephone Encounter (Signed)
Called and spoke with patient's wife. She stated that she was calling to follow up on the tobramycin since Dr. Silas Flood had approved him to start the medication sooner than scheduled. Per wife, the pharmacy needs to know that it is ok to dispense the medication earlier. I advised her that I would call the pharmacy to let them know.  ? ?She verbalized understanding.  ? ?Called [facility] and spoke with Pam. She stated that the solution was already scheduled to be delivered tomorrow.  ? ?Terrilyn Saver back to let her know and she verbalized understanding.  ? ?Nothing further needed at time of call.  ?

## 2021-09-03 NOTE — Progress Notes (Addendum)
Location of tumor and Histology per Pathology Report: RML nodule ? ?Biopsy: Dr. Silas Flood did not think the patient was a candidate for repeat bronchoscopy and was too high risk due to his bronchiectasis ? ?Past/Anticipated interventions by surgeon, if any:  Dr. Silas Flood did not think the patient was a candidate for repeat bronchoscopy and was too high risk due to his bronchiectasis ? ?Past/Anticipated interventions by medical oncology, if any: Dr Julien Nordmann ?PRIOR THERAPY: ?1) Concurrent chemoradiation with chemotherapy the form of weekly carboplatin for AUC 2 and  paclitaxel at 45 mg/m?. Status post 5 cycles of chemotherapy last dose was given 09/22/2014 with partial response. ?2) Left video-assisted thoracoscopy, wedge resection of left lower lobe and thoracoscopic left upper lobectomy with mediastinal lymph node dissection under the care of Dr. Roxan Hockey on 12/15/2014.  ?  ?CURRENT THERAPY: Observation.  Patient referred to radiation oncology for consideration of SBRT to the new right middle lobe pulmonary nodule. ? ? ? ?Pain issues, if any:  no  ? ?SAFETY ISSUES: ?Prior radiation?  08/25/14-09/29/14 left upper central lung 45 Gy ?Pacemaker/ICD? no ?Possible current pregnancy? no ?Is the patient on methotrexate? no ? ?Current Complaints / other details:  hemoptysis with blood clot today, streaked with brown x 2-3 days, shortness of breath with minimal activity, O2 6L pnc, productive cough. Severe constipation. ?   ? ?Vitals:  ? 09/09/21 1236  ?BP: 106/75  ?Pulse: (!) 155  ?Resp: 18  ?Temp: (!) 97.4 ?F (36.3 ?C)  ?TempSrc: Temporal  ?SpO2: 99%  ?Weight: 162 lb (73.5 kg)  ?Height: 5\' 11"  (1.803 m)  ? ? ? ?
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
## 2021-09-09 ENCOUNTER — Ambulatory Visit
Admission: RE | Admit: 2021-09-09 | Discharge: 2021-09-09 | Disposition: A | Discharge status: Home / Self Care | Payer: Medicare Other | Source: Ambulatory Visit | Attending: Radiation Oncology | Admitting: Radiation Oncology

## 2021-09-09 ENCOUNTER — Encounter: Payer: Self-pay | Admitting: Pulmonary Disease

## 2021-09-09 ENCOUNTER — Ambulatory Visit (INDEPENDENT_AMBULATORY_CARE_PROVIDER_SITE_OTHER): Payer: Medicare Other | Admitting: Pulmonary Disease

## 2021-09-09 ENCOUNTER — Telehealth: Payer: Self-pay | Admitting: Pulmonary Disease

## 2021-09-09 ENCOUNTER — Other Ambulatory Visit: Payer: Self-pay

## 2021-09-09 ENCOUNTER — Encounter: Payer: Self-pay | Admitting: Radiation Oncology

## 2021-09-09 VITALS — BP 106/75 | HR 155 | Temp 97.4°F | Resp 18 | Ht 71.0 in | Wt 162.0 lb

## 2021-09-09 DIAGNOSIS — C342 Malignant neoplasm of middle lobe, bronchus or lung: Secondary | ICD-10-CM | POA: Insufficient documentation

## 2021-09-09 DIAGNOSIS — Z79899 Other long term (current) drug therapy: Secondary | ICD-10-CM | POA: Insufficient documentation

## 2021-09-09 DIAGNOSIS — J449 Chronic obstructive pulmonary disease, unspecified: Secondary | ICD-10-CM | POA: Diagnosis not present

## 2021-09-09 DIAGNOSIS — J441 Chronic obstructive pulmonary disease with (acute) exacerbation: Secondary | ICD-10-CM

## 2021-09-09 DIAGNOSIS — I10 Essential (primary) hypertension: Secondary | ICD-10-CM | POA: Diagnosis not present

## 2021-09-09 DIAGNOSIS — Z923 Personal history of irradiation: Secondary | ICD-10-CM | POA: Diagnosis not present

## 2021-09-09 DIAGNOSIS — J9621 Acute and chronic respiratory failure with hypoxia: Secondary | ICD-10-CM

## 2021-09-09 DIAGNOSIS — R911 Solitary pulmonary nodule: Secondary | ICD-10-CM | POA: Insufficient documentation

## 2021-09-09 DIAGNOSIS — C3491 Malignant neoplasm of unspecified part of right bronchus or lung: Secondary | ICD-10-CM

## 2021-09-09 DIAGNOSIS — Z7901 Long term (current) use of anticoagulants: Secondary | ICD-10-CM | POA: Insufficient documentation

## 2021-09-09 DIAGNOSIS — Z7952 Long term (current) use of systemic steroids: Secondary | ICD-10-CM | POA: Insufficient documentation

## 2021-09-09 DIAGNOSIS — Z87891 Personal history of nicotine dependence: Secondary | ICD-10-CM | POA: Diagnosis not present

## 2021-09-09 DIAGNOSIS — K219 Gastro-esophageal reflux disease without esophagitis: Secondary | ICD-10-CM | POA: Diagnosis not present

## 2021-09-09 MED ORDER — PREDNISONE 20 MG PO TABS: 40.0000 mg | ORAL_TABLET | Freq: Every day | ORAL | 0 refills | Status: AC

## 2021-09-09 MED ORDER — AMOXICILLIN-POT CLAVULANATE 875-125 MG PO TABS: 1.0000 | ORAL_TABLET | Freq: Two times a day (BID) | ORAL | 0 refills | Status: AC

## 2021-09-09 NOTE — Progress Notes (Signed)
See MD note for nursing evaluation. °

## 2021-09-09 NOTE — Telephone Encounter (Signed)
Spoke with the pt's spouse and scheduled for appt with JE for televisit at 4 pm today.  ?

## 2021-09-09 NOTE — Patient Instructions (Signed)
COPD exacerbation ?Acute on chronic hypoxemic respiratory failure, home baseline 5L ?--START prednisone 40 mg daily ?--START Augmentin x 7 days ? ?Follow Up Instructions: ?Follow-up with Dr. Silas Flood in 2 weeks ?

## 2021-09-09 NOTE — Progress Notes (Signed)
Virtual Visit via Telephone Note ? ?I connected with Jeffrey Hines on 09/09/21 at  4:00 PM EDT by telephone and verified that I am speaking with the correct person using two identifiers. ? ?Location: ?Patient: Home ?Provider: [facility] ?  ?I discussed the limitations, risks, security and privacy concerns of performing an evaluation and management service by telephone and the availability of in person appointments. I also discussed with the patient that there may be a patient responsible charge related to this service. The patient expressed understanding and agreed to proceed. ? ?History of Present Illness: ?76 year old male with COPD, chronic respiratory failure on 5-6L, bronchiectasis, squamous cell carcinoma of the right lung s/p chemoradiaition and LLL resection and LUL lobectomy, pulmonary embolism on Eliquis,  new hypermetabolic RML nodule ? ?He was recently seen by Rad Onc for consideration of SBRT. He reports brown sputum that started three days ago and was noted to to have blood streaked sputum with a clot today. He is on tobramycin neb for chronic PA. Denies fevers, chills,  ?  ?Observations/Objective: ?SpO2 92% on 6L ? ?Assessment and Plan: ?COPD exacerbation ?Acute on chronic hypoxemic respiratory failure, home baseline 5L ?--START prednisone 40 mg daily ?--START Augmentin x 7 days ? ?Follow Up Instructions: ?Follow-up with Dr. Silas Flood in 2 weeks ?  ?I discussed the assessment and treatment plan with the patient. The patient was provided an opportunity to ask questions and all were answered. The patient agreed with the plan and demonstrated an understanding of the instructions. ?  ?The patient was advised to call back or seek an in-person evaluation if the symptoms worsen or if the condition fails to improve as anticipated. ? ?I provided 30 minutes of non-face-to-face time during this encounter. ? ? ?Anna-Marie Coller Rodman Pickle, MD ? ?

## 2021-09-09 NOTE — Telephone Encounter (Signed)
Please schedule telephone encounter today ?

## 2021-09-09 NOTE — Telephone Encounter (Signed)
Called and spoke with patient's wife with the patient in the background. He stated that he has been coughing up small amounts of blood over the past 3-4 days. It started as bright red streaks in his phlegm. Now it is dark red and appears like a blood clot. He denied any chest pain or discomfort nor sore throat. He has not started any new medications within the last 7 days. He is taking Eliquis 5mg  twice daily. Has not had any recent CXRs.  ? ?He has an appt with radiation oncology today at 1230pm.  ? ?They are aware that [place] is unavailable today.  ? ?Dr. Loanne Drilling, can you please advise? Thanks!  ?

## 2021-09-10 ENCOUNTER — Inpatient Hospital Stay (HOSPITAL_COMMUNITY)
Admission: EM | Admit: 2021-09-10 | Discharge: 2021-10-04 | DRG: 189 | Disposition: E | Payer: No Typology Code available for payment source | Attending: Internal Medicine | Admitting: Internal Medicine

## 2021-09-10 ENCOUNTER — Encounter: Payer: Self-pay | Admitting: Licensed Clinical Social Worker

## 2021-09-10 ENCOUNTER — Other Ambulatory Visit: Payer: Self-pay

## 2021-09-10 ENCOUNTER — Encounter (HOSPITAL_COMMUNITY): Payer: Self-pay | Admitting: Emergency Medicine

## 2021-09-10 ENCOUNTER — Emergency Department (HOSPITAL_COMMUNITY): Payer: No Typology Code available for payment source

## 2021-09-10 ENCOUNTER — Telehealth (HOSPITAL_COMMUNITY): Payer: Self-pay | Admitting: Emergency Medicine

## 2021-09-10 DIAGNOSIS — R54 Age-related physical debility: Secondary | ICD-10-CM | POA: Diagnosis present

## 2021-09-10 DIAGNOSIS — I4891 Unspecified atrial fibrillation: Secondary | ICD-10-CM | POA: Diagnosis present

## 2021-09-10 DIAGNOSIS — J151 Pneumonia due to Pseudomonas: Secondary | ICD-10-CM | POA: Diagnosis present

## 2021-09-10 DIAGNOSIS — I110 Hypertensive heart disease with heart failure: Secondary | ICD-10-CM | POA: Diagnosis present

## 2021-09-10 DIAGNOSIS — Z79891 Long term (current) use of opiate analgesic: Secondary | ICD-10-CM

## 2021-09-10 DIAGNOSIS — R911 Solitary pulmonary nodule: Secondary | ICD-10-CM | POA: Diagnosis present

## 2021-09-10 DIAGNOSIS — K5900 Constipation, unspecified: Secondary | ICD-10-CM | POA: Diagnosis present

## 2021-09-10 DIAGNOSIS — R000 Tachycardia, unspecified: Secondary | ICD-10-CM

## 2021-09-10 DIAGNOSIS — D630 Anemia in neoplastic disease: Secondary | ICD-10-CM | POA: Diagnosis present

## 2021-09-10 DIAGNOSIS — Z66 Do not resuscitate: Secondary | ICD-10-CM | POA: Diagnosis not present

## 2021-09-10 DIAGNOSIS — R7989 Other specified abnormal findings of blood chemistry: Secondary | ICD-10-CM | POA: Diagnosis not present

## 2021-09-10 DIAGNOSIS — J441 Chronic obstructive pulmonary disease with (acute) exacerbation: Secondary | ICD-10-CM | POA: Diagnosis present

## 2021-09-10 DIAGNOSIS — Z8701 Personal history of pneumonia (recurrent): Secondary | ICD-10-CM

## 2021-09-10 DIAGNOSIS — R918 Other nonspecific abnormal finding of lung field: Secondary | ICD-10-CM | POA: Diagnosis not present

## 2021-09-10 DIAGNOSIS — E46 Unspecified protein-calorie malnutrition: Secondary | ICD-10-CM | POA: Diagnosis present

## 2021-09-10 DIAGNOSIS — Z86711 Personal history of pulmonary embolism: Secondary | ICD-10-CM

## 2021-09-10 DIAGNOSIS — K219 Gastro-esophageal reflux disease without esophagitis: Secondary | ICD-10-CM | POA: Diagnosis present

## 2021-09-10 DIAGNOSIS — Z9981 Dependence on supplemental oxygen: Secondary | ICD-10-CM

## 2021-09-10 DIAGNOSIS — G9341 Metabolic encephalopathy: Secondary | ICD-10-CM

## 2021-09-10 DIAGNOSIS — Z79899 Other long term (current) drug therapy: Secondary | ICD-10-CM

## 2021-09-10 DIAGNOSIS — I10 Essential (primary) hypertension: Secondary | ICD-10-CM | POA: Diagnosis present

## 2021-09-10 DIAGNOSIS — Z888 Allergy status to other drugs, medicaments and biological substances status: Secondary | ICD-10-CM

## 2021-09-10 DIAGNOSIS — R042 Hemoptysis: Secondary | ICD-10-CM | POA: Diagnosis present

## 2021-09-10 DIAGNOSIS — D649 Anemia, unspecified: Secondary | ICD-10-CM

## 2021-09-10 DIAGNOSIS — J479 Bronchiectasis, uncomplicated: Secondary | ICD-10-CM | POA: Diagnosis present

## 2021-09-10 DIAGNOSIS — J9621 Acute and chronic respiratory failure with hypoxia: Secondary | ICD-10-CM | POA: Diagnosis not present

## 2021-09-10 DIAGNOSIS — J9622 Acute and chronic respiratory failure with hypercapnia: Secondary | ICD-10-CM | POA: Diagnosis present

## 2021-09-10 DIAGNOSIS — Z801 Family history of malignant neoplasm of trachea, bronchus and lung: Secondary | ICD-10-CM

## 2021-09-10 DIAGNOSIS — Z923 Personal history of irradiation: Secondary | ICD-10-CM

## 2021-09-10 DIAGNOSIS — C3490 Malignant neoplasm of unspecified part of unspecified bronchus or lung: Secondary | ICD-10-CM | POA: Diagnosis present

## 2021-09-10 DIAGNOSIS — R627 Adult failure to thrive: Secondary | ICD-10-CM | POA: Diagnosis present

## 2021-09-10 DIAGNOSIS — Z902 Acquired absence of lung [part of]: Secondary | ICD-10-CM

## 2021-09-10 DIAGNOSIS — Z6822 Body mass index (BMI) 22.0-22.9, adult: Secondary | ICD-10-CM

## 2021-09-10 DIAGNOSIS — Z7901 Long term (current) use of anticoagulants: Secondary | ICD-10-CM

## 2021-09-10 DIAGNOSIS — J9819 Other pulmonary collapse: Secondary | ICD-10-CM | POA: Diagnosis present

## 2021-09-10 DIAGNOSIS — J9383 Other pneumothorax: Secondary | ICD-10-CM | POA: Diagnosis present

## 2021-09-10 DIAGNOSIS — F411 Generalized anxiety disorder: Secondary | ICD-10-CM | POA: Diagnosis present

## 2021-09-10 DIAGNOSIS — F05 Delirium due to known physiological condition: Secondary | ICD-10-CM | POA: Diagnosis present

## 2021-09-10 DIAGNOSIS — M7989 Other specified soft tissue disorders: Secondary | ICD-10-CM | POA: Diagnosis present

## 2021-09-10 DIAGNOSIS — J939 Pneumothorax, unspecified: Secondary | ICD-10-CM

## 2021-09-10 DIAGNOSIS — I4892 Unspecified atrial flutter: Principal | ICD-10-CM

## 2021-09-10 DIAGNOSIS — G2581 Restless legs syndrome: Secondary | ICD-10-CM | POA: Diagnosis present

## 2021-09-10 DIAGNOSIS — R64 Cachexia: Secondary | ICD-10-CM | POA: Diagnosis present

## 2021-09-10 DIAGNOSIS — Z20822 Contact with and (suspected) exposure to covid-19: Secondary | ICD-10-CM | POA: Diagnosis present

## 2021-09-10 DIAGNOSIS — Z9221 Personal history of antineoplastic chemotherapy: Secondary | ICD-10-CM

## 2021-09-10 DIAGNOSIS — Z87891 Personal history of nicotine dependence: Secondary | ICD-10-CM

## 2021-09-10 DIAGNOSIS — Z515 Encounter for palliative care: Secondary | ICD-10-CM

## 2021-09-10 LAB — COMPREHENSIVE METABOLIC PANEL
ALT: 17 U/L (ref 0–44)
AST: 31 U/L (ref 15–41)
Albumin: 3.9 g/dL (ref 3.5–5.0)
Alkaline Phosphatase: 65 U/L (ref 38–126)
BUN: 21 mg/dL (ref 8–23)
CO2: >45 mmol/L — ABNORMAL HIGH (ref 22–32)
Calcium: 8.9 mg/dL (ref 8.9–10.3)
Chloride: 87 mmol/L — ABNORMAL LOW (ref 98–111)
Creatinine, Ser: 0.83 mg/dL (ref 0.61–1.24)
GFR, Estimated: >60 mL/min (ref >60)
Glucose, Bld: 176 mg/dL — ABNORMAL HIGH (ref 70–99)
Potassium: 4 mmol/L (ref 3.5–5.1)
Sodium: 143 mmol/L (ref 135–145)
Total Bilirubin: 1.3 mg/dL — ABNORMAL HIGH (ref 0.3–1.2)
Total Protein: 8.2 g/dL — ABNORMAL HIGH (ref 6.5–8.1)

## 2021-09-10 LAB — BLOOD GAS, ARTERIAL
Acid-Base Excess: 26 mmol/L — ABNORMAL HIGH (ref 0.0–2.0)
Bicarbonate: 55.6 mmol/L — ABNORMAL HIGH (ref 20.0–28.0)
O2 Saturation: 99.6 %
Patient temperature: 37
pCO2 arterial: 80 mmHg (ref 32–48)
pH, Arterial: 7.45 (ref 7.35–7.45)
pO2, Arterial: 110 mmHg — ABNORMAL HIGH (ref 83–108)

## 2021-09-10 LAB — CBC WITH DIFFERENTIAL/PLATELET
Abs Immature Granulocytes: 0.02 10*3/uL (ref 0.00–0.07)
Basophils Absolute: 0 10*3/uL (ref 0.0–0.1)
Basophils Relative: 0 %
Eosinophils Absolute: 0.1 10*3/uL (ref 0.0–0.5)
Eosinophils Relative: 1 %
HCT: 40.2 % (ref 39.0–52.0)
Hemoglobin: 11.8 g/dL — ABNORMAL LOW (ref 13.0–17.0)
Immature Granulocytes: 0 %
Lymphocytes Relative: 3 %
Lymphs Abs: 0.2 10*3/uL — ABNORMAL LOW (ref 0.7–4.0)
MCH: 28.6 pg (ref 26.0–34.0)
MCHC: 29.4 g/dL — ABNORMAL LOW (ref 30.0–36.0)
MCV: 97.6 fL (ref 80.0–100.0)
Monocytes Absolute: 0.5 10*3/uL (ref 0.1–1.0)
Monocytes Relative: 5 %
Neutro Abs: 8.6 10*3/uL — ABNORMAL HIGH (ref 1.7–7.7)
Neutrophils Relative %: 91 %
Platelets: 183 10*3/uL (ref 150–400)
RBC: 4.12 MIL/uL — ABNORMAL LOW (ref 4.22–5.81)
RDW: 14.9 % (ref 11.5–15.5)
WBC: 9.4 10*3/uL (ref 4.0–10.5)
nRBC: 0 % (ref 0.0–0.2)

## 2021-09-10 LAB — MRSA NEXT GEN BY PCR, NASAL: MRSA by PCR Next Gen: NOT DETECTED

## 2021-09-10 LAB — TYPE AND SCREEN
ABO/RH(D): O POS
Antibody Screen: NEGATIVE

## 2021-09-10 LAB — LIPASE, BLOOD: Lipase: 27 U/L (ref 11–51)

## 2021-09-10 LAB — TROPONIN I (HIGH SENSITIVITY)
Troponin I (High Sensitivity): 42 ng/L — ABNORMAL HIGH (ref <18)
Troponin I (High Sensitivity): 40 ng/L — ABNORMAL HIGH (ref <18)

## 2021-09-10 LAB — LACTIC ACID, PLASMA
Lactic Acid, Venous: 1.3 mmol/L (ref 0.5–1.9)
Lactic Acid, Venous: 0.8 mmol/L (ref 0.5–1.9)

## 2021-09-10 LAB — RESP PANEL BY RT-PCR (FLU A&B, COVID) ARPGX2
Influenza A by PCR: NEGATIVE
Influenza B by PCR: NEGATIVE
SARS Coronavirus 2 by RT PCR: NEGATIVE

## 2021-09-10 LAB — MAGNESIUM: Magnesium: 2.3 mg/dL (ref 1.7–2.4)

## 2021-09-10 LAB — BRAIN NATRIURETIC PEPTIDE: B Natriuretic Peptide: 200.6 pg/mL — ABNORMAL HIGH (ref 0.0–100.0)

## 2021-09-10 MED ORDER — SODIUM CHLORIDE 0.9 % IV SOLN
2.0000 g | Freq: Once | INTRAVENOUS | Status: AC
  Administered 2021-09-10: 2 g via INTRAVENOUS
  Filled 2021-09-10: qty 12.5

## 2021-09-10 MED ORDER — PROSIGHT PO TABS
ORAL_TABLET | Freq: Two times a day (BID) | ORAL | Status: DC
Start: 2021-09-10 — End: 2021-09-13
  Administered 2021-09-10 – 2021-09-12 (×5): 1 via ORAL
  Filled 2021-09-10 (×6): qty 1

## 2021-09-10 MED ORDER — ROPINIROLE HCL 1 MG PO TABS
2.0000 mg | ORAL_TABLET | Freq: Every day | ORAL | Status: DC
  Administered 2021-09-10 – 2021-09-12 (×3): 2 mg via ORAL
  Filled 2021-09-10 (×3): qty 2

## 2021-09-10 MED ORDER — SENNA 8.6 MG PO TABS
1.0000 | ORAL_TABLET | Freq: Two times a day (BID) | ORAL | Status: DC
  Administered 2021-09-10 – 2021-09-12 (×5): 8.6 mg via ORAL
  Filled 2021-09-10 (×6): qty 1

## 2021-09-10 MED ORDER — DILTIAZEM HCL-DEXTROSE 125-5 MG/125ML-% IV SOLN (PREMIX)
5.0000 mg/h | INTRAVENOUS | Status: DC
  Administered 2021-09-10 – 2021-09-11 (×2): 5 mg/h via INTRAVENOUS
  Administered 2021-09-11 – 2021-09-13 (×6): 15 mg/h via INTRAVENOUS
  Filled 2021-09-10 (×8): qty 125

## 2021-09-10 MED ORDER — DILTIAZEM HCL 25 MG/5ML IV SOLN
10.0000 mg | Freq: Once | INTRAVENOUS | Status: AC
  Administered 2021-09-10: 10 mg via INTRAVENOUS
  Filled 2021-09-10: qty 5

## 2021-09-10 MED ORDER — ACETAMINOPHEN 650 MG RE SUPP: 650.0000 mg | Freq: Four times a day (QID) | RECTAL | Status: DC | PRN

## 2021-09-10 MED ORDER — TAMSULOSIN HCL 0.4 MG PO CAPS
0.4000 mg | ORAL_CAPSULE | Freq: Every day | ORAL | Status: DC
Start: 2021-09-11 — End: 2021-09-13
  Administered 2021-09-11 – 2021-09-12 (×2): 0.4 mg via ORAL
  Filled 2021-09-10 (×3): qty 1

## 2021-09-10 MED ORDER — SODIUM CHLORIDE 0.9 % IV SOLN
2.0000 g | Freq: Three times a day (TID) | INTRAVENOUS | Status: DC
  Administered 2021-09-11 – 2021-09-13 (×7): 2 g via INTRAVENOUS
  Filled 2021-09-10 (×8): qty 12.5

## 2021-09-10 MED ORDER — ARFORMOTEROL TARTRATE 15 MCG/2ML IN NEBU
15.0000 ug | INHALATION_SOLUTION | Freq: Two times a day (BID) | RESPIRATORY_TRACT | Status: DC
  Administered 2021-09-11 – 2021-09-13 (×5): 15 ug via RESPIRATORY_TRACT
  Filled 2021-09-10 (×5): qty 2

## 2021-09-10 MED ORDER — BISACODYL 5 MG PO TBEC: 5.0000 mg | DELAYED_RELEASE_TABLET | Freq: Every day | ORAL | Status: DC | PRN

## 2021-09-10 MED ORDER — VANCOMYCIN HCL IN DEXTROSE 1-5 GM/200ML-% IV SOLN
1000.0000 mg | Freq: Two times a day (BID) | INTRAVENOUS | Status: DC
  Administered 2021-09-11 – 2021-09-12 (×3): 1000 mg via INTRAVENOUS
  Filled 2021-09-10 (×3): qty 200

## 2021-09-10 MED ORDER — ALBUTEROL SULFATE (2.5 MG/3ML) 0.083% IN NEBU: 2.5000 mg | INHALATION_SOLUTION | RESPIRATORY_TRACT | Status: DC | PRN

## 2021-09-10 MED ORDER — SODIUM CHLORIDE 0.9 % IV BOLUS
1000.0000 mL | Freq: Once | INTRAVENOUS | Status: AC
  Administered 2021-09-10: 1000 mL via INTRAVENOUS

## 2021-09-10 MED ORDER — ACETAMINOPHEN 325 MG PO TABS: 650.0000 mg | ORAL_TABLET | Freq: Four times a day (QID) | ORAL | Status: DC | PRN

## 2021-09-10 MED ORDER — IOHEXOL 350 MG/ML SOLN
100.0000 mL | Freq: Once | INTRAVENOUS | Status: AC | PRN
Start: 2021-09-10 — End: 2021-09-10
  Administered 2021-09-10: 100 mL via INTRAVENOUS

## 2021-09-10 MED ORDER — PREDNISONE 20 MG PO TABS
40.0000 mg | ORAL_TABLET | Freq: Every day | ORAL | Status: DC
  Administered 2021-09-11 – 2021-09-13 (×3): 40 mg via ORAL
  Filled 2021-09-10 (×4): qty 2

## 2021-09-10 MED ORDER — DOCUSATE SODIUM 100 MG PO CAPS
100.0000 mg | ORAL_CAPSULE | Freq: Two times a day (BID) | ORAL | Status: DC
  Administered 2021-09-10 – 2021-09-12 (×5): 100 mg via ORAL
  Filled 2021-09-10 (×7): qty 1

## 2021-09-10 MED ORDER — CHLORHEXIDINE GLUCONATE CLOTH 2 % EX PADS
6.0000 | MEDICATED_PAD | Freq: Every day | CUTANEOUS | Status: DC
  Administered 2021-09-10 – 2021-09-12 (×2): 6 via TOPICAL

## 2021-09-10 MED ORDER — SODIUM CHLORIDE 0.9% FLUSH
3.0000 mL | Freq: Two times a day (BID) | INTRAVENOUS | Status: DC
  Administered 2021-09-10 – 2021-09-13 (×6): 3 mL via INTRAVENOUS

## 2021-09-10 MED ORDER — CLONAZEPAM 0.5 MG PO TABS
0.5000 mg | ORAL_TABLET | Freq: Two times a day (BID) | ORAL | Status: DC | PRN
  Administered 2021-09-10 – 2021-09-13 (×3): 0.5 mg via ORAL
  Filled 2021-09-10 (×4): qty 1

## 2021-09-10 MED ORDER — VANCOMYCIN HCL IN DEXTROSE 1-5 GM/200ML-% IV SOLN
1000.0000 mg | Freq: Once | INTRAVENOUS | Status: AC
  Administered 2021-09-10: 1000 mg via INTRAVENOUS
  Filled 2021-09-10: qty 200

## 2021-09-10 MED ORDER — POLYETHYLENE GLYCOL 3350 17 G PO PACK: 17.0000 g | PACK | Freq: Every day | ORAL | Status: DC | PRN

## 2021-09-10 MED ORDER — ORAL CARE MOUTH RINSE
15.0000 mL | Freq: Two times a day (BID) | OROMUCOSAL | Status: DC
  Administered 2021-09-10 – 2021-09-12 (×3): 15 mL via OROMUCOSAL

## 2021-09-10 MED ORDER — DILTIAZEM LOAD VIA INFUSION
10.0000 mg | Freq: Once | INTRAVENOUS | Status: AC
Start: 2021-09-10 — End: 2021-09-10
  Administered 2021-09-10: 10 mg via INTRAVENOUS
  Filled 2021-09-10: qty 10

## 2021-09-10 MED ORDER — FUROSEMIDE 40 MG PO TABS
20.0000 mg | ORAL_TABLET | Freq: Every day | ORAL | Status: DC
  Administered 2021-09-11 – 2021-09-12 (×2): 20 mg via ORAL
  Filled 2021-09-10 (×2): qty 1

## 2021-09-10 NOTE — ED Notes (Signed)
MD aware of ABG results, order placed for bipap and MD consulting with pulmonary  ?
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
## 2021-09-10 NOTE — ED Triage Notes (Signed)
Patient c/o coughing up bright red blood since yesterday. Pulmonology recommended increasing dose of prednisone. Also reports constipation x5 days. ?

## 2021-09-10 NOTE — Progress Notes (Signed)
[facility]  ?Initial Assessment ? ? ?Jeffrey Hines is a 76 y.o. year old male referred by medical team for assessment of psychosocial needs.  ? ?SDOH (Social Determinants of Health) assessments performed: Yes ?  ?Distress Screen completed: No ? ?  09/09/2021  ?  1:07 PM  ?ONCBCN DISTRESS SCREENING  ?Screening Type Initial Screening  ?Distress experienced in past week (1-10) 8  ?Family Problem type Partner  ?Emotional problem type Nervousness/Anxiety;Adjusting to illness;Feeling hopeless;Boredom;Adjusting to appearance changes  ?Spiritual/Religous concerns type Facing my mortality  ?Physical Problem type Pain;Getting around;Breathing;Constipation/diarrhea;Changes in urination;Tingling hands/feet;Skin dry/itchy;Swollen arms/legs  ?Other best way to contact - [phone]  ? ? ? ? ?Family/Social Information:  ?Housing Arrangement: patient lives with spouse . ?Family members/support persons in your life? Pt resides w/ his wife and has a brother and sister-in-law 15 minutes away who offer a great deal of support.  Pt's spouse is a retired Engineer, water.  Spouse recognizes the importance of self care and utilizes pt's family to allow herself respite. ?Transportation concerns: no  ?Employment: Retired. Income source: [employer] [employer] ?Financial concerns: No ?Type of concern: None ?Food access concerns: no ?Religious or spiritual practice: not identified ?Services Currently in place:  no homecare - pt does use O2 at baseline due to COPD ? ?Coping/ Adjustment to diagnosis: ?Patient understands treatment plan and what happens next? yes ?Concerns about diagnosis and/or treatment:  Evolving GOC .  Per spouse GOC were discussed at length during last hospitalization.  Spouse states she is comfortable with making decisions if needed, but stressed the importance of allowing pt to make his own decisions for as long as he is able. ?Patient reported stressors: Adjusting to my illness ?Hopes and priorities:  To extend life while maintaining quality of life ?Patient enjoys  not identified ?Current coping skills/ strengths: Supportive family/friends  ? ? ? SUMMARY: ?Current SDOH Barriers:  ?None identified ? ?Clinical Social Work Clinical Goal(s):  ?Pt/spouse will reach out to CSW should any needs arise. ? ?Interventions: ?Discussed common feeling and emotions when being diagnosed with cancer, and the importance of support during treatment ?Informed patient of the support team roles and support services at [facility] ?Provided CSW contact information and encouraged patient to call with any questions or concerns ?Provided spouse with emotional support. ? ? ?Follow Up Plan: Patient will contact CSW with any support or resource needs ?Patient verbalizes understanding of plan: Yes ? ? ? ?Henriette Combs, LCSW ?

## 2021-09-10 NOTE — Progress Notes (Signed)
Pharmacy Antibiotic Note ? ?Jeffrey Hines is a 76 y.o. male admitted on 09/17/2021 with pneumonia.  Pharmacy has been consulted for vancomycin and cefepime dosing. ? ?Plan: ?Cefepime 2g IV q8h ?Vancomycin 1g IV q12h for estimated AUC 522 using SCr 0.83, Vd 0.72 ?Check vancomycin levels as needed, goal AUC 400-550 ?Follow up renal function & cultures ?  ? ?Temp (24hrs), Avg:98.1 ?F (36.7 ?C), Min:98.1 ?F (36.7 ?C), Max:98.1 ?F (36.7 ?C) ? ?Recent Labs  ?Lab 09/27/2021 ?1507 09/25/2021 ?1533 09/26/2021 ?1645  ?WBC 9.4  --   --   ?CREATININE 0.83  --   --   ?LATICACIDVEN  --  1.3 0.8  ?  ?Estimated Creatinine Clearance: 79.9 mL/min (by C-G formula based on SCr of 0.83 mg/dL).   ? ?Allergies  ?Allergen Reactions  ? Anoro Ellipta [Umeclidinium-Vilanterol] Hives  ? Prolixin [Fluphenazine]   ?  Severe back pain  ? Spiriva Respimat [Tiotropium Bromide Monohydrate] Other (See Comments)  ?  unknown  ? Advair Diskus [Fluticasone-Salmeterol] Anxiety  ? ? ?Antimicrobials this admission: ?4/7 Vanc >> ?4/7 Cefepime >> ? ?Dose adjustments this admission: ? ?Microbiology results: ?4/7 BCx: ?4/7 Respiratory panel: ? ?Thank you for allowing pharmacy to be a part of this patient?s care. ? ?Peggyann Juba, PharmD, BCPS ?Pharmacy: [phone] ?09/12/2021 6:51 PM ? ?

## 2021-09-10 NOTE — Progress Notes (Signed)
eLink Physician-Brief Progress Note ?Patient Name: Jeffrey Hines ?DOB: April 02, 1946 ?MRN: [id number] ? ? ?Date of Service ? 09/20/2021  ?HPI/Events of Note ? 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 He contacted pulmonary office yesterday for blood tinged sputum and advised to increase baseline prednisone and started on Augmentin. Admitted for AoC hypoxemic respiratory failure and hemoptysis.  ? ?On baseline O2, appear comfortable, NAD. Wife at bedside. ?Currently in [place] on dilt gtt ?TRH is primary team. PCCM to consult formally in am  ?eICU Interventions ? Give additional dilt bolus ?RN has paged primary team  ? ? ? ?Intervention Category ?Major Interventions: Respiratory failure - evaluation and management ? ?Berania Peedin Rodman Pickle ?09/05/2021, 9:47 PM ?

## 2021-09-10 NOTE — Progress Notes (Signed)
A consult was received from an ED physician for cefepime/vancomycin per pharmacy dosing.  The patient's profile has been reviewed for ht/wt/allergies/indication/available labs.   ?A one time order has been placed for cefepime 2g, vancomycin 1g.  Further antibiotics/pharmacy consults should be ordered by admitting physician if indicated.       ?                ?Thank you, ?Peggyann Juba, PharmD, BCPS ?09/24/2021  5:59 PM ? ?

## 2021-09-10 NOTE — Telephone Encounter (Signed)
Mistake note. ?

## 2021-09-10 NOTE — ED Provider Notes (Signed)
?[facility] DEPT ?Provider Note ? ? ?CSN: [id number] ?Arrival date & time: 09/30/2021  1441 ? ?  ? ?History ? ?Chief Complaint  ?Patient presents with  ? Hemoptysis  ? Constipation  ? ? ?Jeffrey Hines is a 76 y.o. male. ? ?The history is provided by the patient.  ?Cough ?Cough characteristics:  Productive ?Sputum characteristics:  Bloody ?Severity:  Moderate ?Onset quality:  Gradual ?Duration:  4 days ?Timing:  Intermittent ?Progression:  Waxing and waning ?Chronicity:  New ?Context comment:  COPD/Bronchetasis on 6L O2. PE on eliquis with worsening of coughing up blood. Also with constipation. Denies vomting of blood or blood in stool. Pt started on steroids and abx yesterfay by pulm ?Relieved by:  Nothing ?Worsened by:  Nothing ?Associated symptoms: shortness of breath   ?Associated symptoms: no chest pain, no chills, no diaphoresis, no ear fullness, no ear pain, no fever, no headaches, no myalgias, no rash, no rhinorrhea, no sinus congestion, no sore throat, no weight loss and no wheezing   ? ?  ? ?Home Medications ?Prior to Admission medications   ?Medication Sig Start Date End Date Taking? Authorizing Provider  ?acetaminophen (TYLENOL) 500 MG tablet Take 1,000 mg by mouth every 8 (eight) hours as needed for moderate pain.    [provider]  ?albuterol (PROVENTIL) (2.5 MG/3ML) 0.083% nebulizer solution Take 3 mLs (2.5 mg total) by nebulization every 2 (two) hours as needed for wheezing or shortness of breath. 12/20/19   Nita Sells, MD  ?albuterol (VENTOLIN HFA) 108 (90 Base) MCG/ACT inhaler Inhale 2 puffs into the lungs every 6 (six) hours as needed for wheezing or shortness of breath.    [provider]  ?amoxicillin-clavulanate (AUGMENTIN) 875-125 MG tablet Take 1 tablet by mouth 2 (two) times daily. 09/09/21   Margaretha Seeds, MD  ?apixaban (ELIQUIS) 5 MG TABS tablet Take 1 tablet (5 mg total) by mouth 2 (two) times daily. 06/17/21 09/15/21  Kayleen Memos, DO  ?azelastine (ASTELIN) 0.1 % nasal spray Place 1 spray into the nose 2 (two) times daily as needed for rhinitis. 09/02/20   [provider]  ?BL GLUCOSAMINE-CHONDROITIN PO Take 1 tablet by mouth daily. 08/07/15   [provider]  ?clonazePAM (KLONOPIN) 0.5 MG tablet Take 0.5 mg by mouth 2 (two) times daily.  01/27/16   [provider]  ?fluticasone (FLONASE) 50 MCG/ACT nasal spray Place 1 spray into both nostrils daily. ?Patient taking differently: Place 1 spray into both nostrils daily as needed for allergies. 08/27/20   Hunsucker, [name], MD  ?furosemide (LASIX) 20 MG tablet Take 1 tablet (20 mg total) by mouth daily. ?Patient taking differently: Take 20-40 mg by mouth See admin instructions. 20 mg on Monday Wednesday Thursday and Saturdays ?40 mg Tuesday Friday and Sunday 01/07/20   Martyn Ehrich, NP  ?guaiFENesin (MUCINEX) 600 MG 12 hr tablet Take 1 tablet (600 mg total) by mouth 2 (two) times daily. ?Patient taking differently: Take 400 mg by mouth 2 (two) times daily. Gets 400 mg tablet from [facility] 12/20/19   Nita Sells, MD  ?Loratadine-Pseudoephedrine (CLARITIN-D 12 HOUR PO) Take 1 tablet by mouth 2 (two) times daily.    [provider]  ?Misc Natural Products (OSTEO BI-FLEX ADV JOINT SHIELD PO) Take 1 tablet by mouth daily.    [provider]  ?Multiple Vitamins-Minerals (PRESERVISION AREDS 2 PO) Take 1 capsule by mouth 2 (two) times daily.    [provider]  ?oxyCODONE (OXY IR/ROXICODONE)  5 MG immediate release tablet Take 0.5 tablets (2.5 mg total) by mouth every 8 (eight) hours as needed for severe pain or moderate pain. ?Patient not taking: Reported on 09/09/2021 08/12/21   Hunsucker, [name], MD  ?OXYGEN Inhale 5 L/min into the lungs continuous.    [provider]  ?polyethylene glycol (MIRALAX / GLYCOLAX) 17 g packet Take 17 g by mouth daily as needed for mild constipation. 06/16/21   Kayleen Memos, DO  ?predniSONE  (DELTASONE) 20 MG tablet TAKE 1 TABLET BY MOUTH DAILY WITH BREAKFAST 07/22/21   Hunsucker, [name], MD  ?predniSONE (DELTASONE) 20 MG tablet Take 2 tablets (40 mg total) by mouth daily with breakfast for 5 days. 09/09/21 09/14/21  Margaretha Seeds, MD  ?Respiratory Therapy Supplies (FLUTTER) DEVI 1 Device by Does not apply route as directed. 03/20/17   Javier Glazier, MD  ?rOPINIRole (REQUIP) 1 MG tablet Take 2 mg by mouth at bedtime.    [provider]  ?Saw Palmetto 450 MG CAPS Take 450-900 mg by mouth See admin instructions. Takes 900 mg in the morning and 450 mg at night.    [provider]  ?sodium chloride HYPERTONIC 3 % nebulizer solution Take 4 mLs by nebulization 2 (two) times daily as needed for cough.  01/16/20   [provider]  ?tamsulosin (FLOMAX) 0.4 MG CAPS capsule 0.4 mg daily.    [provider]  ?Tiotropium Bromide-Olodaterol (STIOLTO RESPIMAT) 2.5-2.5 MCG/ACT AERS Inhale 2 puffs into the lungs daily.     [provider]  ?tobramycin, PF, (TOBI) 300 MG/5ML nebulizer solution Inhale one ampule via nebulizer twice daily for 4 weeks. Then hold for 4 weeks. 4 Weeks on , 4 weeks off 07/08/21   Hunsucker, [name], MD  ?   ? ?Allergies    ?Anoro ellipta [umeclidinium-vilanterol], Prolixin [fluphenazine], Spiriva respimat [tiotropium bromide monohydrate], and Advair diskus [fluticasone-salmeterol]   ? ?Review of Systems   ?Review of Systems  ?Constitutional:  Negative for chills, diaphoresis, fever and weight loss.  ?HENT:  Negative for ear pain, rhinorrhea and sore throat.   ?Respiratory:  Positive for cough and shortness of breath. Negative for wheezing.   ?Cardiovascular:  Negative for chest pain.  ?Musculoskeletal:  Negative for myalgias.  ?Skin:  Negative for rash.  ?Neurological:  Negative for headaches.  ? ?Physical Exam ?Updated Vital Signs ?BP 100/78   Pulse (!) 156   Temp 98.1 ?F (36.7 ?C) (Oral)   Resp (!) 22   SpO2 97%  ?Physical Exam ?Vitals  and nursing note reviewed.  ?Constitutional:   ?   General: He is not in acute distress. ?   Appearance: He is well-developed. He is ill-appearing.  ?HENT:  ?   Head: Normocephalic and atraumatic.  ?   Nose: Nose normal.  ?   Mouth/Throat:  ?   Mouth: Mucous membranes are moist.  ?Eyes:  ?   Extraocular Movements: Extraocular movements intact.  ?   Pupils: Pupils are equal, round, and reactive to light.  ?   Comments: Pale conjuctiva ?  ?Cardiovascular:  ?   Rate and Rhythm: Regular rhythm. Tachycardia present.  ?   Pulses: Normal pulses.  ?   Heart sounds: Normal heart sounds. No murmur heard. ?Pulmonary:  ?   Comments: Coarse breath sounds throughout ? ?Abdominal:  ?   Palpations: Abdomen is soft.  ?   Tenderness: There is no abdominal tenderness.  ?Musculoskeletal:     ?   General: No  swelling.  ?   Cervical back: Normal range of motion and neck supple.  ?   Right lower leg: Edema present.  ?   Left lower leg: Edema present.  ?Skin: ?   General: Skin is warm and dry.  ?   Capillary Refill: Capillary refill takes less than 2 seconds.  ?Neurological:  ?   General: No focal deficit present.  ?   Mental Status: He is alert.  ?Psychiatric:     ?   Mood and Affect: Mood normal.  ? ? ?ED Results / Procedures / Treatments   ?Labs ?(all labs ordered are listed, but only abnormal results are displayed) ?Labs Reviewed  ?CBC WITH DIFFERENTIAL/PLATELET - Abnormal; Notable for the following components:  ?    Result Value  ? RBC 4.12 (*)   ? Hemoglobin 11.8 (*)   ? MCHC 29.4 (*)   ? Neutro Abs 8.6 (*)   ? Lymphs Abs 0.2 (*)   ? All other components within normal limits  ?COMPREHENSIVE METABOLIC PANEL - Abnormal; Notable for the following components:  ? Chloride 87 (*)   ? CO2 >45 (*)   ? Glucose, Bld 176 (*)   ? Total Protein 8.2 (*)   ? Total Bilirubin 1.3 (*)   ? All other components within normal limits  ?BRAIN NATRIURETIC PEPTIDE - Abnormal; Notable for the following components:  ? B Natriuretic Peptide 200.6 (*)   ? All  other components within normal limits  ?TROPONIN I (HIGH SENSITIVITY) - Abnormal; Notable for the following components:  ? Troponin I (High Sensitivity) 42 (*)   ? All other components within normal limits  ?TROPONIN I

## 2021-09-10 NOTE — ED Notes (Signed)
Hold bipap at this time per MD, pt is compensated  ?

## 2021-09-11 ENCOUNTER — Inpatient Hospital Stay (HOSPITAL_COMMUNITY): Payer: No Typology Code available for payment source

## 2021-09-11 ENCOUNTER — Encounter (HOSPITAL_COMMUNITY): Payer: Self-pay | Admitting: Internal Medicine

## 2021-09-11 ENCOUNTER — Observation Stay (HOSPITAL_COMMUNITY): Payer: No Typology Code available for payment source

## 2021-09-11 DIAGNOSIS — G2581 Restless legs syndrome: Secondary | ICD-10-CM | POA: Diagnosis present

## 2021-09-11 DIAGNOSIS — J441 Chronic obstructive pulmonary disease with (acute) exacerbation: Secondary | ICD-10-CM | POA: Diagnosis not present

## 2021-09-11 DIAGNOSIS — F411 Generalized anxiety disorder: Secondary | ICD-10-CM | POA: Diagnosis present

## 2021-09-11 DIAGNOSIS — I4891 Unspecified atrial fibrillation: Secondary | ICD-10-CM | POA: Diagnosis present

## 2021-09-11 DIAGNOSIS — J9621 Acute and chronic respiratory failure with hypoxia: Secondary | ICD-10-CM | POA: Diagnosis present

## 2021-09-11 DIAGNOSIS — G9341 Metabolic encephalopathy: Secondary | ICD-10-CM | POA: Diagnosis present

## 2021-09-11 DIAGNOSIS — J479 Bronchiectasis, uncomplicated: Secondary | ICD-10-CM | POA: Diagnosis present

## 2021-09-11 DIAGNOSIS — R64 Cachexia: Secondary | ICD-10-CM | POA: Diagnosis present

## 2021-09-11 DIAGNOSIS — I483 Typical atrial flutter: Secondary | ICD-10-CM | POA: Diagnosis not present

## 2021-09-11 DIAGNOSIS — Z9981 Dependence on supplemental oxygen: Secondary | ICD-10-CM | POA: Diagnosis not present

## 2021-09-11 DIAGNOSIS — I4892 Unspecified atrial flutter: Principal | ICD-10-CM

## 2021-09-11 DIAGNOSIS — F05 Delirium due to known physiological condition: Secondary | ICD-10-CM | POA: Diagnosis present

## 2021-09-11 DIAGNOSIS — J9819 Other pulmonary collapse: Secondary | ICD-10-CM | POA: Diagnosis present

## 2021-09-11 DIAGNOSIS — Z515 Encounter for palliative care: Secondary | ICD-10-CM | POA: Diagnosis not present

## 2021-09-11 DIAGNOSIS — D649 Anemia, unspecified: Secondary | ICD-10-CM

## 2021-09-11 DIAGNOSIS — E46 Unspecified protein-calorie malnutrition: Secondary | ICD-10-CM | POA: Diagnosis present

## 2021-09-11 DIAGNOSIS — J9383 Other pneumothorax: Secondary | ICD-10-CM | POA: Diagnosis present

## 2021-09-11 DIAGNOSIS — Z20822 Contact with and (suspected) exposure to covid-19: Secondary | ICD-10-CM | POA: Diagnosis present

## 2021-09-11 DIAGNOSIS — J9622 Acute and chronic respiratory failure with hypercapnia: Secondary | ICD-10-CM | POA: Diagnosis present

## 2021-09-11 DIAGNOSIS — C3490 Malignant neoplasm of unspecified part of unspecified bronchus or lung: Secondary | ICD-10-CM | POA: Diagnosis present

## 2021-09-11 DIAGNOSIS — K5900 Constipation, unspecified: Secondary | ICD-10-CM | POA: Diagnosis present

## 2021-09-11 DIAGNOSIS — Z7189 Other specified counseling: Secondary | ICD-10-CM | POA: Diagnosis not present

## 2021-09-11 DIAGNOSIS — D630 Anemia in neoplastic disease: Secondary | ICD-10-CM | POA: Diagnosis present

## 2021-09-11 DIAGNOSIS — I110 Hypertensive heart disease with heart failure: Secondary | ICD-10-CM | POA: Diagnosis present

## 2021-09-11 DIAGNOSIS — Z902 Acquired absence of lung [part of]: Secondary | ICD-10-CM | POA: Diagnosis not present

## 2021-09-11 DIAGNOSIS — Z9221 Personal history of antineoplastic chemotherapy: Secondary | ICD-10-CM | POA: Diagnosis not present

## 2021-09-11 DIAGNOSIS — R042 Hemoptysis: Secondary | ICD-10-CM | POA: Diagnosis present

## 2021-09-11 DIAGNOSIS — Z66 Do not resuscitate: Secondary | ICD-10-CM | POA: Diagnosis not present

## 2021-09-11 DIAGNOSIS — R918 Other nonspecific abnormal finding of lung field: Secondary | ICD-10-CM | POA: Diagnosis present

## 2021-09-11 LAB — RESPIRATORY PANEL BY PCR

## 2021-09-11 LAB — COMPREHENSIVE METABOLIC PANEL
ALT: 14 U/L (ref 0–44)
AST: 18 U/L (ref 15–41)
Albumin: 3.5 g/dL (ref 3.5–5.0)
Alkaline Phosphatase: 59 U/L (ref 38–126)
BUN: 16 mg/dL (ref 8–23)
CO2: >45 mmol/L — ABNORMAL HIGH (ref 22–32)
Calcium: 8.7 mg/dL — ABNORMAL LOW (ref 8.9–10.3)
Chloride: 89 mmol/L — ABNORMAL LOW (ref 98–111)
Creatinine, Ser: 0.73 mg/dL (ref 0.61–1.24)
GFR, Estimated: >60 mL/min (ref >60)
Glucose, Bld: 139 mg/dL — ABNORMAL HIGH (ref 70–99)
Potassium: 4 mmol/L (ref 3.5–5.1)
Sodium: 143 mmol/L (ref 135–145)
Total Bilirubin: 0.7 mg/dL (ref 0.3–1.2)
Total Protein: 7.2 g/dL (ref 6.5–8.1)

## 2021-09-11 LAB — ECHOCARDIOGRAM LIMITED BUBBLE STUDY
Height: 71 in
Weight: 2567.92 oz

## 2021-09-11 LAB — BLOOD GAS, ARTERIAL
Acid-Base Excess: 26.2 mmol/L — ABNORMAL HIGH (ref 0.0–2.0)
Bicarbonate: 56.4 mmol/L — ABNORMAL HIGH (ref 20.0–28.0)
Expiratory PAP: 6 cmH2O
FIO2: 45 %
Inspiratory PAP: 16 cmH2O
Mode: POSITIVE
O2 Saturation: 99.5 %
Patient temperature: 37
RATE: 16 resp/min
pCO2 arterial: 85 mmHg (ref 32–48)
pH, Arterial: 7.43 (ref 7.35–7.45)
pO2, Arterial: 78 mmHg — ABNORMAL LOW (ref 83–108)

## 2021-09-11 LAB — CBC
HCT: 36.7 % — ABNORMAL LOW (ref 39.0–52.0)
Hemoglobin: 10.5 g/dL — ABNORMAL LOW (ref 13.0–17.0)
MCH: 28.2 pg (ref 26.0–34.0)
MCHC: 28.6 g/dL — ABNORMAL LOW (ref 30.0–36.0)
MCV: 98.7 fL (ref 80.0–100.0)
Platelets: 171 10*3/uL (ref 150–400)
RBC: 3.72 MIL/uL — ABNORMAL LOW (ref 4.22–5.81)
RDW: 14.8 % (ref 11.5–15.5)
WBC: 8.1 10*3/uL (ref 4.0–10.5)
nRBC: 0 % (ref 0.0–0.2)

## 2021-09-11 LAB — T4, FREE: Free T4: 1.04 ng/dL (ref 0.61–1.12)

## 2021-09-11 LAB — TSH: TSH: 0.204 u[IU]/mL — ABNORMAL LOW (ref 0.350–4.500)

## 2021-09-11 MED ORDER — AMIODARONE HCL IN DEXTROSE 360-4.14 MG/200ML-% IV SOLN
30.0000 mg/h | INTRAVENOUS | Status: DC
  Administered 2021-09-11 – 2021-09-13 (×4): 30 mg/h via INTRAVENOUS
  Filled 2021-09-11 (×5): qty 200

## 2021-09-11 MED ORDER — SODIUM CHLORIDE 0.9 % IV SOLN: INTRAVENOUS | Status: DC

## 2021-09-11 MED ORDER — AMIODARONE HCL IN DEXTROSE 360-4.14 MG/200ML-% IV SOLN
60.0000 mg/h | INTRAVENOUS | Status: AC
  Administered 2021-09-11 (×2): 60 mg/h via INTRAVENOUS
  Filled 2021-09-11 (×2): qty 200

## 2021-09-11 MED ORDER — POLYETHYLENE GLYCOL 3350 17 G PO PACK
17.0000 g | PACK | Freq: Two times a day (BID) | ORAL | Status: DC
  Administered 2021-09-11 – 2021-09-12 (×3): 17 g via ORAL
  Filled 2021-09-11 (×4): qty 1

## 2021-09-11 MED ORDER — ORAL CARE MOUTH RINSE
15.0000 mL | Freq: Two times a day (BID) | OROMUCOSAL | Status: DC
  Administered 2021-09-11 – 2021-09-12 (×4): 15 mL via OROMUCOSAL

## 2021-09-11 MED ORDER — AMIODARONE LOAD VIA INFUSION
150.0000 mg | Freq: Once | INTRAVENOUS | Status: AC
  Administered 2021-09-11: 150 mg via INTRAVENOUS
  Filled 2021-09-11: qty 83.34

## 2021-09-11 MED ORDER — METOPROLOL TARTRATE 5 MG/5ML IV SOLN
5.0000 mg | Freq: Once | INTRAVENOUS | Status: AC
  Administered 2021-09-11: 5 mg via INTRAVENOUS
  Filled 2021-09-11: qty 5

## 2021-09-11 MED ORDER — ENSURE ENLIVE PO LIQD
237.0000 mL | Freq: Three times a day (TID) | ORAL | Status: DC
  Administered 2021-09-11 – 2021-09-12 (×4): 237 mL via ORAL

## 2021-09-11 NOTE — Progress Notes (Signed)
Initial Nutrition Assessment ? ?DOCUMENTATION CODES:  ? ?Not applicable ? ?INTERVENTION:  ? ?-Ensure Enlive po TID, each supplement provides 350 kcal and 20 grams of protein ?-Continue MVI daily ? ?NUTRITION DIAGNOSIS:  ? ?Increased nutrient needs related to chronic illness (COPD) as evidenced by estimated needs. ? ?GOAL:  ? ?Patient will meet greater than or equal to 90% of their needs ? ?MONITOR:  ? ?PO intake, Supplement acceptance, Labs, Weight trends, I & O's ? ?REASON FOR ASSESSMENT:  ? ?Consult ?Assessment of nutrition requirement/status ? ?ASSESSMENT:  ? ?Jeffrey Hines is a 76 y.o. 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 ? ?Pt admitted with COPD exacerbation, hemoptysis, and respiratory failure.  ? ?Reviewed I/O's: +46 ml x 24 hours ? ?UOP: 400 ml x 24 hours ?  ?Per MD notes, ABG worsening despite Bi-pap. Palliative care consult pending for goals of care. Pt wife agreeable to DNR/DNI and hospice, but pt having difficulty deciding goals of care. Per MD, pt with very poor prognosis. ? ?Pt currently on a regular diet. No meal completions documented. RD will add nutritional supplements to help optimize intake if able to be liberated from Bi-pap to eat. ? ?Reviewed wt hx; wt has been stable over the past 10 months. ?  ?Medications reviewed and include cardizem, miralax, prendione, and senokot. ? ?Lab Results  ?Component Value Date  ? HGBA1C 5.8 (H) 04/24/2020  ? PTA DM medications are none.  ? ?Labs reviewed: CBGS: 152 (inpatient orders for glycemic control are ).   ? ?Diet Order:   ?Diet Order   ? ?       ?  Diet regular Room service appropriate? Yes; Fluid consistency: Thin  Diet effective now       ?   ? ?  ?  ? ?  ? ? ?EDUCATION NEEDS:  ? ?No education needs have been identified at this time ? ?Skin:  Skin Assessment: Reviewed RN Assessment ? ?Last BM:  09/06/21 ? ?Height:  ? ?Ht Readings from Last 1 Encounters:  ?10/02/2021 5\' 11"  (1.803 m)  ? ? ?Weight:  ? ?Wt Readings from Last 1 Encounters:  ?09/11/21 72.8 kg  ? ? ?Ideal Body Weight:  78.1 kg ? ?BMI:  Body mass index is 22.38 kg/m?. ? ?Estimated Nutritional Needs:  ? ?Kcal:  2250-2450 ? ?Protein:  115-130 grams ? ?Fluid:  > 2 L ? ? ? ?Loistine Chance, RD, LDN, CDCES ?Registered Dietitian II ?Certified Diabetes Care and Education Specialist ?Please refer to [facility] for RD and/or RD on-call/weekend/after hours pager  ?

## 2021-09-11 NOTE — Assessment & Plan Note (Addendum)
No BM for approximately a week PTA. ?Aggressive bowel regimen. ?Minimal BM after Dulcolax suppository yesterday.  Discomfort from constipation and as per spouse may be affecting his breathing. ?Fleet enema, discussed with patient's RN. ?

## 2021-09-11 NOTE — Progress Notes (Signed)
During the night, the pt's HR remained elevated. MD made aware. Multiple orders received and initiated.   ?

## 2021-09-11 NOTE — Assessment & Plan Note (Addendum)
Suspected due to malignancy, chronic disease ?Stable. ?

## 2021-09-11 NOTE — Progress Notes (Signed)
76 y.o male admitted with Acute on Chronic hypoxic hypercapneic respiratory failure in the setting of AECOPD, Lung ca, pneumothorax and chronic pseudomonas Infection. Patient with persistent elevated HR>145 bpm refractory to antiarrythmic. ? ?Atrial Flutter vs Multifocal Atrial Tachycardia? ?-Check TSH, Free T4, hold Amiodarone if abnormal, continue Cardizem  ?-Use betablocker cautiously given significant hx of pulmonary disease ?-Continuous cardiac monitoring ?-Treat underlying cause ?-Follow BMP+ Mag ?-Aggressive electrolytes replacement as indicated ?-Cardiology consult to assist with rate control ?-Pulmonology consult to assist with management of underlying pulmonary issues ?  ? ?Rufina Falco, DNP, CCRN, FNP-C, AGACNP-BC ?Acute Care Nurse Practitioner  ?[facility] ?Pager: [phone] ?[facility] ? ? ?  ?  ? ? ? ?  ?  ? ?
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
## 2021-09-11 NOTE — Assessment & Plan Note (Signed)
Continue clonazepam as needed. ?

## 2021-09-11 NOTE — Progress Notes (Signed)
Patient was set up on BIPAP because he couldn't catch his breath . Saturation is currently 94% on the current settings. Patient is tolerating well at this time RT will continue to monitor ?

## 2021-09-11 NOTE — Assessment & Plan Note (Signed)
Continue ropinirole 

## 2021-09-11 NOTE — Plan of Care (Signed)
?  Interdisciplinary Goals of Care Family Meeting ? ? ?Date carried out: 09/11/2021 ? ?Location of the meeting: Bedside ? ?Member's involved: Physician, Bedside Registered Nurse, and Other: woiife ? ?Durable Power of Tour manager: patient and then wife   ? ?Discussion: We discussed goals of care for Jeffrey Hines .  See Consult ? ?Code status: Full Code ? ?Disposition: Continue current acute care but wife agreeable for DNR, DNI., Hospice. Patient not on board yet.  ? ?Discussed ?Discussed medicare hospice benefit ? - a medicare paid benefit ? - for people with terminal qualifying  illness such as IPF, COPD, cancer with statistical prognosis < 6 months for which there is no cure ?- utilization of hospice shows people live longer paradoxically than those without hospice due to improved attention ? - explained hospice locations - home, residential etc.,  ? - explained respite care options for caregivers ? - explained that she could still get treatment for non-hospice diagnosis and still come to office to see me for hospice related diagnosis that i provide support for ? - explained that hospice provides nursing, MD, chaplain, volunteer and medications and supplies paid through medicare ? ? ?Time spent for the meeting: 30 min ? ? ? ? ?SIGNATURE  ? ? ?Dr. Brand Males, M.D., F.C.C.P,  ?Pulmonary and Critical Care Medicine ?Staff Physician, [place] ?Center Director - Interstitial Lung Disease  Program  ?P[facility] [place] [facility] ?[place], [place], [address] ? ?NPI Number:  NPI #[id number] ?DEA Number: [id number] ? ?Pager: [phone], If no answer  -> Check AMION or Try [phone] ?Telephone (clinical office): [phone] ?Telephone (research): [phone] ? ?10:09 AM ?09/11/2021 ? ? ?

## 2021-09-11 NOTE — Hospital Course (Addendum)
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 followed by left upper lobectomy and left lower lobe wedge resection which left him with residual left apical necrotic area and distorted anatomy of the left lower lobe resulting in mediastinal shift to the left side, new onset A-fib with RVR and community-acquired pneumonia in January 2023, started newly on Eliquis and inhaled tobramycin and at which time CT of the chest showed complete opacification of the left lung suggesting blocked airway to the left lower lobe and a new right middle lobe lung nodule suspicious for primary or metastatic lung cancer, high risk repeat bronchoscopy seen on 4/6 by radiation oncology for SBRT to new RML lung nodule, HTN, anxiety, RLS,?  PE, presented with 3 to 4 days history of frank hemoptysis of around 1/4-1/2 cup per day and worsening dyspnea.  As per PCCM read of CTA chest, no PE, persistent left lung whiteout, left upper lobe loculated pneumothorax and no meds in the abdomen or pelvis.  Admitted to stepdown unit for hemoptysis, acute on chronic respiratory failure with hypoxia and hypercapnia, COPD exacerbation and A-fib with RVR.  PCCM and cardiology consulted.  Tenuous respiratory status, overall extremely poor prognosis, bronchoscopy with prohibitive risk and no procedures planned.  Palliative care consulted.   ? ?On 09/14/2021, patient rapidly decompensated, palliative care followed up and did not feel that he could safely return home with hospice, transitioned him to full comfort care in the hospital and he demised on 09/14/21. ?

## 2021-09-11 NOTE — Progress Notes (Signed)
The BIPAP settings were increased post ABG. His current settings are 20/6 R18 ?35% . Rt will continue to monitor ?

## 2021-09-11 NOTE — Assessment & Plan Note (Addendum)
Non-small cell lung cancer, squamous cell cancer ?S/p left upper lobectomy and left lower lobe wedge resection ?Left lower lobe collapse due to obstruction, new since January 2023 with left lung whiteout ?Highly suspected RML new lung cancer versus metastasis ?Hemoptysis may be related to underlying lung cancer, left apical necrotic lesion versus others in the context of Eliquis. ?Eliquis held. ?PCCM consultation appreciated and are considering very very high risk bronchoscopy to evaluate.  Indicate no role for IR for massive hemoptysis and palliative if develops massive hemoptysis. ?BiPAP as needed. ?IV antibiotics and steroids, bronchodilator nebs ?Chest x2: Negative to date.  RVP panel negative.  MRSA PCR negative and vancomycin discontinued.  Continue cefepime.  Blood cultures x2: Negative to date. ?Hemoptysis much improved but overall respiratory status remains tenuous. ?As per palliative care follow-up yesterday, patient now agreeable to going home with hospice and palliative team trying to coordinate same. ?

## 2021-09-11 NOTE — Progress Notes (Signed)
Patient placed on Bi-Pap with help from RT, O2 saturation and work of breathing improved immediately.  ? ? ?
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
## 2021-09-11 NOTE — Progress Notes (Signed)
?  Echocardiogram ?2D Echocardiogram has been performed. ? ?Jeffrey Hines ?09/11/2021, 10:00 AM ?

## 2021-09-11 NOTE — Assessment & Plan Note (Addendum)
History of A-fib on Eliquis PTA ?CHA2DS2-VASc score: 3 ?Eliquis held due to significant hemoptysis ?TSH 0.204 but free T4 normal at 1.04. ?Remains on maximum dose of IV Cardizem 15 mg/h and amiodarone at 30 ?EP cardiology follow-up appreciated.  Discussed with Dr. Curt Bears, unfortunately not much options, RV significantly dilated by echo, recommends hospice. ?Given reported low urine output, poor oral intake and severe RV dilatation/severely reduced RV function/RV failure, initiated gentle IV fluids which may help control rate.  Completed 24 hours of IV fluids. ?Rate remains in RVR in the 120s. ?

## 2021-09-11 NOTE — TOC Initial Note (Signed)
Transition of Care (TOC) - Initial/Assessment Note  ? ? ?Patient Details  ?Name: Jeffrey Hines ?MRN: [id number] ?Date of Birth: [birth year] ? ?Transition of Care (TOC) CM/SW Contact:    ?Tawanna Cooler, RN ?Phone Number: ?09/11/2021, 2:19 PM ? ?Clinical Narrative:                 ? ?Patient already has home O2.   ?TOC following for discharge needs.  ? ? ?Expected Discharge Plan: Home/Self Care ?Barriers to Discharge: Continued Medical Work up ? ? ?Expected Discharge Plan and Services ?Expected Discharge Plan: Home/Self Care ?  ?  ?  ?Living arrangements for the past 2 months: [place] ?                ?  ?Prior Living Arrangements/Services ?Living arrangements for the past 2 months: [place] ?Lives with:: Spouse ?Patient language and need for interpreter reviewed:: Yes ?       ?Need for Family Participation in Patient Care: Yes (Comment) ?Care giver support system in place?: Yes (comment) ?  ?Criminal Activity/Legal Involvement Pertinent to Current Situation/Hospitalization: No - Comment as needed ? ?Activities of Daily Living ?Home Assistive Devices/Equipment: Shower chair with back, Oxygen, Eyeglasses, Dentures (specify type) ?ADL Screening (condition at time of admission) ?Patient's cognitive ability adequate to safely complete daily activities?: No ?Is the patient deaf or have difficulty hearing?: Yes ?Does the patient have difficulty seeing, even when wearing glasses/contacts?: Yes ?Does the patient have difficulty concentrating, remembering, or making decisions?: Yes ?Patient able to express need for assistance with ADLs?: Yes ?Does the patient have difficulty dressing or bathing?: Yes ?Independently performs ADLs?: Yes (appropriate for developmental age) ?Communication: Independent ?Dressing (OT): Independent ?Is this a change from baseline?: Pre-admission baseline ?Grooming: Needs assistance ?Feeding: Independent ?Bathing: Needs assistance ?Is this a change from baseline?:  Pre-admission baseline ?Toileting: Independent ?In/Out Bed: Independent ?Walks in Home: Independent ?Does the patient have difficulty walking or climbing stairs?: Yes ?Weakness of Legs: Both ?Weakness of Arms/Hands: Both ? ?Emotional Assessment ?   ?Orientation: : Oriented to Self, Oriented to Place, Oriented to  Time, Oriented to Situation ?Alcohol / Substance Use: Not Applicable ?Psych Involvement: No (comment) ? ?Admission diagnosis:  Lung mass [R91.8] ?Atrial flutter with rapid ventricular response ([place]) [I48.92] ?Hemoptysis [R04.2] ?Acute on chronic respiratory failure with hypoxia (HCC) [J96.21] ?Pneumothorax, unspecified type [J93.9] ?Patient Active Problem List  ? Diagnosis Date Noted  ? Atrial flutter with rapid ventricular response ([place]) 09/11/2021  ? Constipation 09/11/2021  ? Normocytic anemia 09/11/2021  ? Acute on chronic respiratory failure with hypoxia ([place]) 09/17/2021  ? Acute on chronic respiratory failure with hypoxemia ([place]) 09/09/2021  ? History of pulmonary embolism 06/15/2021  ? Pressure injury of skin 06/10/2021  ? Healthcare maintenance 05/01/2020  ? Bronchiectasis with (acute) exacerbation ([place]) 01/07/2020  ? Headache 01/07/2020  ? Lower GI bleed 12/16/2019  ? Sepsis ([place])   ? Postobstructive pneumonia   ? Pneumonia due to Pseudomonas species ([place])   ? Restless leg syndrome 09/07/2018  ? Cough 03/20/2017  ? Lung cancer ([place]) 12/15/2014  ? Chronic respiratory failure with hypoxia ([place]) 08/19/2014  ? Malnutrition of moderate degree ([place]) 08/08/2014  ? Squamous cell carcinoma of lung, stage II ([place])   ? Anxiety state   ? Right middle lobe pulmonary nodule   ? COPD exacerbation ([place]) 08/03/2014  ? Hemoptysis 08/03/2014  ? COPD with chronic bronchitis ([place])   ? Tobacco abuse   ? Hypertension   ? ?PCP:  [facility] ?Pharmacy:   ?[id number] [address] [address], [place] ?[address] ?[place] [address] ?Phone: [phone] Fax:  [phone] ? ? ? ?

## 2021-09-11 NOTE — Progress Notes (Signed)
?PROGRESS NOTE ?  ?Jeffrey Hines  SJG:[id number]    DOB: August 12, 1945    DOA: 09/14/2021 ? ?PCP: [facility]  ? ?I have briefly reviewed patients previous medical records in [facility]. ? ?Chief Complaint  ?Patient presents with  ? Hemoptysis  ? Constipation  ? ? 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 followed by left upper lobectomy and left lower lobe wedge resection which left him with residual left apical necrotic area and distorted anatomy of the left lower lobe resulting in mediastinal shift to the left side, new onset A-fib with RVR and community-acquired pneumonia in January 2023, started newly on Eliquis and inhaled tobramycin and at which time CT of the chest showed complete opacification of the left lung suggesting blocked airway to the left lower lobe and a new right middle lobe lung nodule suspicious for primary or metastatic lung cancer, high risk repeat bronchoscopy seen on 4/6 by radiation oncology for SBRT to new RML lung nodule, HTN, anxiety, RLS,?  PE, presented with 3 to 4 days history of frank hemoptysis of around 1/4-1/2 cup per day and worsening dyspnea.  As per PCCM read of CTA chest, no PE, persistent left lung whiteout, left upper lobe loculated pneumothorax and no meds in the abdomen or pelvis.  Admitted to stepdown unit for hemoptysis, acute on chronic respiratory failure with hypoxia and hypercapnia, COPD exacerbation and A-fib with RVR.  PCCM and cardiology consulted. ? ?Assessment and Plan: ?* Hemoptysis ?Non-small cell lung cancer, squamous cell cancer ?S/p left upper lobectomy and left lower lobe wedge resection ?Left lower lobe collapse due to obstruction, new since January 2023 with left lung whiteout ?Highly suspected RML new lung cancer  versus metastasis ?Hemoptysis may be related to underlying lung cancer, left apical necrotic lesion versus others in the context of Eliquis. ?Eliquis held. ?PCCM consultation appreciated and are considering very very high risk bronchoscopy to evaluate.  Indicate no role for IR for massive hemoptysis and palliative if develops massive hemoptysis. ?BiPAP as needed. ?IV antibiotics and steroids, bronchodilator nebs ?Chest x2: Negative to date.  RVP panel negative.  MRSA PCR negative and so could consider stopping vancomycin. ?PCCM MD has discussed goals of care in detail with patient and spouse.  Spouse agreeable to DNR, DNI and home hospice but patient does not appear ready.  Remains full code. ? ?Atrial flutter with rapid ventricular response ([place]) ?History of A-fib on Eliquis PTA ?CHA2DS2-VASc score: 3 ?Eliquis held due to significant hemoptysis ?Initiated on IV Cardizem but only on 5 mg/h this morning, titrate up for adequate rate control. ?EP cardiology input appreciated and are adding amiodarone to his diltiazem. ?TSH 0.204 but free T4 normal at 1.04. ? ?Acute on chronic respiratory failure with hypoxia (HCC) ?On baseline home oxygen 5 to 6 L/min at home ?Presented with worsening dyspnea ?Secondary to complex lung situation including hemoptysis, COPD exacerbation, other significant lung findings as noted above. ?Currently on HFNC 11 L/min. ?Per pulmonology, BiPAP as needed. ?Remains full code and PCCM discussing [place] with patient and spouse. ? ?COPD exacerbation ([place]) ?Continue prednisone 40 Mg daily ?Brovana nebs twice daily and albuterol nebs as needed ?Flutter valve. ? ?Hypertension ?Not on antihypertensives PTA. ?Only on Lasix, continued ?Controlled.  Monitor closely while on Cardizem drip. ? ?Anxiety state ?Continue clonazepam as needed. ? ?Restless leg  syndrome ?Continue ropinirole. ? ?Constipation ?No BM for approximately a week PTA. ?Aggressive bowel regimen. ? ?Normocytic anemia ?Suspected due to  malignancy, chronic disease ?Stable. ?Follow CBC. ? ? ?Body mass index is 22.38 kg/m?[place] Kitchen ?Nutritional Status ?  ?  ?  ?Pressure Ulcer: ?  ? ?DVT prophylaxis: SCDs Start: 09/11/2021 1840   ?  Code Status: Full Code:  ?Family Communication: None at bedside ?Disposition:  ?Status is: Observation ?The patient will require care spanning > 2 midnights and should be moved to inpatient because: Severity of illness, HFNC, IV meds, bronchoscopy etc. ?  ? ?Consultants:   ?PCCM ?Cardiology ? ?Procedures:   ?None ? ?Antimicrobials:   ?IV cefepime and vancomycin 4/7 > ? ? ?Subjective:  ?Reports that he follows with Dr. Silas Flood, PCCM.  Denies palpitation or chest pain.  Reports some RLQ chronic intermittent pain and no BM for approximately a week but passing flatus.  Productive cough.  Dyspnea improved compared to admission but still has dyspnea. ? ?Objective:  ? ?Vitals:  ? 09/11/21 0803 09/11/21 0843 09/11/21 0846 09/11/21 0900  ?BP:      ?Pulse: (!) 143  (!) 145   ?Resp: (!) 24  (!) 27   ?Temp: 97.6 ?F (36.4 ?C)     ?TempSrc: Oral     ?SpO2: 96% 97% 93% 98%  ?Weight:      ?Height:      ? ? ?General exam: Elderly male, moderately built and frail, chronically ill looking sitting propped up in bed with obvious mild to moderate respiratory distress, dyspneic even while speaking. ?Respiratory system: Harsh and reduced breath sounds bilaterally globally with some scattered coarse crackles, few rhonchi and?  Bilateral pleural rubs.  Mild to moderate respiratory distress. ?Cardiovascular system: S1 & S2 heard, regular tachycardia. No JVD, murmurs, rubs, gallops or clicks. No pedal edema.  Telemetry personally reviewed: Atrial flutter with RVR in the 140s, was in the 150s on admission. ?Gastrointestinal system: Abdomen is nondistended, soft and nontender. No organomegaly or masses felt. Normal bowel sounds heard. ?Central nervous system: Alert and oriented. No focal neurological deficits. ?Extremities: Symmetric 5 x 5 power. ?Skin: No  rashes, lesions or ulcers ?Psychiatry: Judgement and insight appear normal. Mood & affect appropriate.  ? ?Data Reviewed:   ?I have personally reviewed following labs and imaging studies ? ?CBC: ?Recent Labs  ?Lab 09/26/2021 ?1507 09/11/21 ?2440  ?WBC 9.4 8.1  ?NEUTROABS 8.6*  --   ?HGB 11.8* 10.5*  ?HCT 40.2 36.7*  ?MCV 97.6 98.7  ?PLT 183 171  ? ?Basic Metabolic Panel: ?Recent Labs  ?Lab 09/26/2021 ?1507 09/19/2021 ?1645 09/11/21 ?1027  ?NA 143  --  143  ?K 4.0  --  4.0  ?CL 87*  --  89*  ?CO2 >45*  --  >45*  ?GLUCOSE 176*  --  139*  ?BUN 21  --  16  ?CREATININE 0.83  --  0.73  ?CALCIUM 8.9  --  8.7*  ?MG  --  2.3  --   ? ?Liver Function Tests: ?Recent Labs  ?Lab 09/22/2021 ?1507 09/11/21 ?2536  ?AST 31 18  ?ALT 17 14  ?ALKPHOS 65 59  ?BILITOT 1.3* 0.7  ?PROT 8.2* 7.2  ?ALBUMIN 3.9 3.5  ? ?CBG: ?No results for input(s): GLUCAP in the last 168 hours. ?Microbiology Studies:  ? ?Recent Results (from the past 240 hour(s))  ?Resp Panel by RT-PCR (Flu A&B, Covid)     Status: None  ? Collection Time: 10/01/2021  3:08 PM  ? Specimen: Nasopharyngeal(NP) swabs  in vial transport medium  ?Result Value Ref Range Status  ? SARS Coronavirus 2 by RT PCR NEGATIVE NEGATIVE Final  ?  Comment: (NOTE) ?SARS-CoV-2 target nucleic acids are NOT DETECTED. ? ?The SARS-CoV-2 RNA is generally detectable in upper respiratory ?specimens during the acute phase of infection. The lowest ?concentration of SARS-CoV-2 viral copies this assay can detect is ?138 copies/mL. A negative result does not preclude SARS-Cov-2 ?infection and should not be used as the sole basis for treatment or ?other patient management decisions. A negative result may occur with  ?improper specimen collection/handling, submission of specimen other ?than nasopharyngeal swab, presence of viral mutation(s) within the ?areas targeted by this assay, and inadequate number of viral ?copies(<138 copies/mL). A negative result must be combined with ?clinical observations, patient history, and  epidemiological ?information. The expected result is Negative. ? ?Fact Sheet for Patients:  ?EntrepreneurPulse.com.au ? ?Fact Sheet for Healthcare Providers:  ?CubaZine.tn

## 2021-09-11 NOTE — Assessment & Plan Note (Addendum)
On baseline home oxygen 5 to 6 L/min at home ?Presented with worsening dyspnea ?Secondary to complex lung situation including hemoptysis, COPD exacerbation, other significant lung findings as noted above. ?Currently on HFNC 7 L/min ?Per pulmonology, BiPAP as needed. ?Remains full code and PCCM discussing [place] with patient and spouse. ?Not able to tolerate BiPAP consistently. ?Now DNR. ?Added as needed morphine solution for severe dyspnea. ?Ongoing dyspnea even while speaking.  On HFNC 7 L/min. ?

## 2021-09-11 NOTE — Progress Notes (Signed)
Upon assessment  the patient was placed on 11L [place] to maintain oxygen saturation. He had been on 6L [place] with saturatin decreasing with speaking.He is tolerating well at this time. RT will continue to monitor ?

## 2021-09-11 NOTE — Assessment & Plan Note (Signed)
Continue prednisone 40 Mg daily ?Brovana nebs twice daily and albuterol nebs as needed ?Flutter valve. ?

## 2021-09-11 NOTE — Progress Notes (Signed)
? ?ABG reviewed and the PCO2 is worse despite the BiPAP.  His echocardiogram shows severe RV failure.  No intracardiac shunt ? ?Plan ?- Continue 2 applications of daytime BiPAP each 2 hours but at nighttime BiPAP ?-His prognosis is very poor and I believe he might be terminally ill ?-I do not believe intubation is in his best interest -this might have to be revisited again with him and his wife ? ? ? ?SIGNATURE  ? ? ?Dr. Brand Males, M.D., F.C.C.P,  ?Pulmonary and Critical Care Medicine ?Staff Physician, [place] ?Center Director - Interstitial Lung Disease  Program  ?Pulmonary [place] [facility] ?[place], [place], [address] ? ?NPI Number:  NPI #[id number] ?DEA Number: [id number] ? ?Pager: [phone], If no answer  -> Check AMION or Try [phone] ?Telephone (clinical office): [phone] ?Telephone (research): [phone] ? ?3:34 PM ?09/11/2021 ? ? ? ? ?Recent Labs  ?Lab 09/23/2021 ?1913 09/11/21 ?1416  ?PHART 7.45 7.43  ?PCO2ART 80* 85*  ?PO2ART 110* 78*  ?HCO3 55.6* 56.4*  ?O2SAT 99.6 99.5  ? ? ? ?CT Angio Chest PE W and/or Wo Contrast ? ?Result Date: 09/04/2021 ?CLINICAL DATA:  Hemoptysis beginning yesterday. Acute abdominal pain. Constipation. Non-small cell lung carcinoma. EXAM: CT ANGIOGRAPHY CHEST CT ABDOMEN AND PELVIS WITH CONTRAST TECHNIQUE: Multidetector CT imaging of the chest was performed using the standard protocol during bolus administration of intravenous contrast. Multiplanar CT image reconstructions and MIPs were obtained to evaluate the vascular anatomy. Multidetector CT imaging of the abdomen and pelvis was performed using the standard protocol during bolus administration of intravenous contrast. RADIATION DOSE REDUCTION: This exam was performed according to the departmental dose-optimization program which includes automated exposure control, adjustment of the mA and/or kV according to patient size and/or use of iterative reconstruction  technique. CONTRAST:  118mL OMNIPAQUE IOHEXOL 350 MG/ML SOLN COMPARISON:  Chest CTA on 06/09/2021, and PET-CT on 07/07/2021 FINDINGS: CTA CHEST FINDINGS Cardiovascular: Satisfactory opacification of pulmonary arteries noted, and no pulmonary emboli identified. No evidence of thoracic aortic dissection or aneurysm. Aortic atherosclerotic calcification noted. Mediastinum/Nodes: No masses or pathologically enlarged lymph nodes identified. Lungs/Pleura: Stable postop changes from left upper lobectomy. Left lower lobe consolidation and volume loss with air bronchograms shows no significant change. A small pneumothorax is seen in the left lung apex which is new since previous study and suspicious for bronchopleural fistula. Compensatory hyperinflation of the right lung is again demonstrated, with severe emphysema. A spiculated nodule in the central right middle lobe on image 83/4 measures 16 x 13 mm, compared to 11 x 9 mm previously. Previously seen patchy areas of airspace opacity in the right upper and middle lobes has nearly completely resolved. No evidence of pleural effusion. Musculoskeletal: No suspicious bone lesions identified. Review of the MIP images confirms the above findings. CT ABDOMEN and PELVIS FINDINGS Hepatobiliary: No hepatic masses identified. Gallbladder is unremarkable. No evidence of biliary ductal dilatation. Pancreas:  No mass or inflammatory changes. Spleen: Within normal limits in size and appearance. Adrenals/Urinary Tract: No masses identified. Benign-appearing right renal cyst again noted (no followup imaging is recommended). no evidence of ureteral calculi or hydronephrosis. Unremarkable unopacified urinary bladder. Stomach/Bowel: No evidence of obstruction, inflammatory process or abnormal fluid collections. Diverticulosis is seen mainly involving the descending and sigmoid colon, however there is no evidence of diverticulitis. Vascular/Lymphatic: No pathologically enlarged lymph nodes. No  acute vascular findings. Aortic atherosclerotic calcification noted. Reproductive:  Stable mildly enlarged prostate.  Other:  None. Musculoskeletal:  No suspicious bone lesions identified. Review of the MIP images confirms the above findings. IMPRESSION: No evidence of pulmonary embolism. New small left apical pneumothorax, suspicious for bronchopleural fistula. Previous left upper lobectomy, with no significant change in left lower lobe consolidation and volume loss. Increased size of 16 mm spiculated nodule in central right middle lobe, highly suspicious for bronchogenic carcinoma. No acute findings or evidence of metastatic disease within the abdomen or pelvis. Colonic diverticulosis, without radiographic evidence of diverticulitis. Stable mildly enlarged prostate. Aortic Atherosclerosis (ICD10-I70.0) and Emphysema (ICD10-J43.9). Electronically Signed   By: Marlaine Hind M.D.   On: 09/11/2021 17:33  ? ?CT ABDOMEN PELVIS W CONTRAST ? ?Result Date: 09/20/2021 ?CLINICAL DATA:  Hemoptysis beginning yesterday. Acute abdominal pain. Constipation. Non-small cell lung carcinoma. EXAM: CT ANGIOGRAPHY CHEST CT ABDOMEN AND PELVIS WITH CONTRAST TECHNIQUE: Multidetector CT imaging of the chest was performed using the standard protocol during bolus administration of intravenous contrast. Multiplanar CT image reconstructions and MIPs were obtained to evaluate the vascular anatomy. Multidetector CT imaging of the abdomen and pelvis was performed using the standard protocol during bolus administration of intravenous contrast. RADIATION DOSE REDUCTION: This exam was performed according to the departmental dose-optimization program which includes automated exposure control, adjustment of the mA and/or kV according to patient size and/or use of iterative reconstruction technique. CONTRAST:  168mL OMNIPAQUE IOHEXOL 350 MG/ML SOLN COMPARISON:  Chest CTA on 06/09/2021, and PET-CT on 07/07/2021 FINDINGS: CTA CHEST FINDINGS Cardiovascular:  Satisfactory opacification of pulmonary arteries noted, and no pulmonary emboli identified. No evidence of thoracic aortic dissection or aneurysm. Aortic atherosclerotic calcification noted. Mediastinum/Nodes: No masses or pathologically enlarged lymph nodes identified. Lungs/Pleura: Stable postop changes from left upper lobectomy. Left lower lobe consolidation and volume loss with air bronchograms shows no significant change. A small pneumothorax is seen in the left lung apex which is new since previous study and suspicious for bronchopleural fistula. Compensatory hyperinflation of the right lung is again demonstrated, with severe emphysema. A spiculated nodule in the central right middle lobe on image 83/4 measures 16 x 13 mm, compared to 11 x 9 mm previously. Previously seen patchy areas of airspace opacity in the right upper and middle lobes has nearly completely resolved. No evidence of pleural effusion. Musculoskeletal: No suspicious bone lesions identified. Review of the MIP images confirms the above findings. CT ABDOMEN and PELVIS FINDINGS Hepatobiliary: No hepatic masses identified. Gallbladder is unremarkable. No evidence of biliary ductal dilatation. Pancreas:  No mass or inflammatory changes. Spleen: Within normal limits in size and appearance. Adrenals/Urinary Tract: No masses identified. Benign-appearing right renal cyst again noted (no followup imaging is recommended). no evidence of ureteral calculi or hydronephrosis. Unremarkable unopacified urinary bladder. Stomach/Bowel: No evidence of obstruction, inflammatory process or abnormal fluid collections. Diverticulosis is seen mainly involving the descending and sigmoid colon, however there is no evidence of diverticulitis. Vascular/Lymphatic: No pathologically enlarged lymph nodes. No acute vascular findings. Aortic atherosclerotic calcification noted. Reproductive:  Stable mildly enlarged prostate. Other:  None. Musculoskeletal:  No suspicious bone  lesions identified. Review of the MIP images confirms the above findings. IMPRESSION: No evidence of pulmonary embolism. New small left apical pneumothorax, suspicious for bronchopleural fistula. Previous lef

## 2021-09-11 NOTE — Assessment & Plan Note (Addendum)
Not on antihypertensives PTA. ?Only on Lasix, discontinued due to low urine output and clinically on the dry side ?Controlled.  Monitor closely while on Cardizem drip. ?

## 2021-09-12 DIAGNOSIS — I483 Typical atrial flutter: Secondary | ICD-10-CM

## 2021-09-12 DIAGNOSIS — R042 Hemoptysis: Secondary | ICD-10-CM | POA: Diagnosis not present

## 2021-09-12 DIAGNOSIS — Z7189 Other specified counseling: Secondary | ICD-10-CM | POA: Diagnosis not present

## 2021-09-12 DIAGNOSIS — I4892 Unspecified atrial flutter: Secondary | ICD-10-CM | POA: Diagnosis not present

## 2021-09-12 DIAGNOSIS — J9621 Acute and chronic respiratory failure with hypoxia: Secondary | ICD-10-CM | POA: Diagnosis not present

## 2021-09-12 DIAGNOSIS — J441 Chronic obstructive pulmonary disease with (acute) exacerbation: Secondary | ICD-10-CM | POA: Diagnosis not present

## 2021-09-12 DIAGNOSIS — Z515 Encounter for palliative care: Secondary | ICD-10-CM

## 2021-09-12 DIAGNOSIS — R918 Other nonspecific abnormal finding of lung field: Secondary | ICD-10-CM

## 2021-09-12 LAB — CBC
HCT: 36.6 % — ABNORMAL LOW (ref 39.0–52.0)
Hemoglobin: 10.7 g/dL — ABNORMAL LOW (ref 13.0–17.0)
MCH: 28.5 pg (ref 26.0–34.0)
MCHC: 29.2 g/dL — ABNORMAL LOW (ref 30.0–36.0)
MCV: 97.6 fL (ref 80.0–100.0)
Platelets: 195 10*3/uL (ref 150–400)
RBC: 3.75 MIL/uL — ABNORMAL LOW (ref 4.22–5.81)
RDW: 14.9 % (ref 11.5–15.5)
WBC: 8.1 10*3/uL (ref 4.0–10.5)
nRBC: 0 % (ref 0.0–0.2)

## 2021-09-12 LAB — BASIC METABOLIC PANEL
BUN: 25 mg/dL — ABNORMAL HIGH (ref 8–23)
CO2: >45 mmol/L — ABNORMAL HIGH (ref 22–32)
Calcium: 8.8 mg/dL — ABNORMAL LOW (ref 8.9–10.3)
Chloride: 86 mmol/L — ABNORMAL LOW (ref 98–111)
Creatinine, Ser: 0.97 mg/dL (ref 0.61–1.24)
GFR, Estimated: >60 mL/min (ref >60)
Glucose, Bld: 120 mg/dL — ABNORMAL HIGH (ref 70–99)
Potassium: 4.1 mmol/L (ref 3.5–5.1)
Sodium: 140 mmol/L (ref 135–145)

## 2021-09-12 MED ORDER — FLEET ENEMA 7-19 GM/118ML RE ENEM
1.0000 | ENEMA | Freq: Every day | RECTAL | Status: DC | PRN
  Administered 2021-09-13: 1 via RECTAL
  Filled 2021-09-12: qty 1

## 2021-09-12 MED ORDER — SODIUM CHLORIDE 0.9 % IV SOLN
INTRAVENOUS | Status: DC | PRN
Start: 2021-09-12 — End: 2021-09-14

## 2021-09-12 MED ORDER — SALINE SPRAY 0.65 % NA SOLN
1.0000 | NASAL | Status: DC | PRN
  Administered 2021-09-13: 1 via NASAL
  Filled 2021-09-12 (×2): qty 44

## 2021-09-12 MED ORDER — BISACODYL 10 MG RE SUPP
10.0000 mg | Freq: Once | RECTAL | Status: AC
  Administered 2021-09-12: 10 mg via RECTAL
  Filled 2021-09-12: qty 1

## 2021-09-12 MED ORDER — LACTATED RINGERS IV SOLN: INTRAVENOUS | Status: DC

## 2021-09-12 MED ORDER — MORPHINE SULFATE 10 MG/5ML PO SOLN
5.0000 mg | ORAL | Status: DC | PRN
Start: 2021-09-12 — End: 2021-09-13

## 2021-09-12 MED ORDER — MORPHINE SULFATE (PF) 2 MG/ML IV SOLN
0.5000 mg | Freq: Once | INTRAVENOUS | Status: AC | PRN
  Administered 2021-09-12: 0.5 mg via INTRAVENOUS
  Filled 2021-09-12: qty 1

## 2021-09-12 NOTE — Progress Notes (Signed)
Pts wife refused bipap at this time bc his lunch has been ordered. RN aware. ?
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
## 2021-09-12 NOTE — Progress Notes (Signed)
Spoke with patient about going on bipap. Told him it may help his breathing some. He does not want to go on bipap at this time. I told him to let his nurse know if he changes his mind.  ?

## 2021-09-12 NOTE — Progress Notes (Signed)
Pt refused bipap for tonight. He said he does not want to wear it. Machine remained bedside. No resp distress noted at this time.  ?

## 2021-09-12 NOTE — Progress Notes (Signed)
? ?Progress Note ? ?Patient Name: Jeffrey Hines ?Date of Encounter: 09/12/2021 ? ?[facility] Cardiologist: None  ? ?Subjective  ? ?Remains in rapid atrial flutter.  Echo yesterday showed normal LV systolic function, very severely dilated RV and RA.  Respiratory status remains stable. ? ?Inpatient Medications  ?  ?Scheduled Meds: ? arformoterol  15 mcg Nebulization BID  ? Chlorhexidine Gluconate Cloth  6 each Topical Daily  ? docusate sodium  100 mg Oral BID  ? feeding supplement  237 mL Oral TID BM  ? furosemide  20 mg Oral Daily  ? mouth rinse  15 mL Mouth Rinse BID  ? mouth rinse  15 mL Mouth Rinse BID  ? multivitamin   Oral BID  ? polyethylene glycol  17 g Oral BID  ? predniSONE  40 mg Oral Q breakfast  ? rOPINIRole  2 mg Oral QHS  ? senna  1 tablet Oral BID  ? sodium chloride flush  3 mL Intravenous Q12H  ? tamsulosin  0.4 mg Oral Daily  ? ?Continuous Infusions: ? sodium chloride 10 mL/hr at 09/12/21 0215  ? amiodarone 30 mg/hr (09/12/21 0215)  ? ceFEPime (MAXIPIME) IV Stopped (09/12/21 0206)  ? diltiazem (CARDIZEM) infusion 15 mg/hr (09/12/21 0521)  ? lactated ringers 50 mL/hr at 09/12/21 0837  ? vancomycin 1,000 mg (09/12/21 0825)  ? ?PRN Meds: ?acetaminophen **OR** acetaminophen, albuterol, bisacodyl, clonazePAM, polyethylene glycol, sodium chloride, sodium phosphate  ? ?Vital Signs  ?  ?Vitals:  ? 09/12/21 0400 09/12/21 0500 09/12/21 0600 09/12/21 0700  ?BP: 124/87 129/86 122/82 113/86  ?Pulse: (!) 133 (!) 133 (!) 132 (!) 131  ?Resp: (!) 23 (!) 24 (!) 27 (!) 22  ?Temp:      ?TempSrc:      ?SpO2: 96% 91% 100% 93%  ?Weight:  72.8 kg    ?Height:      ? ? ?Intake/Output Summary (Last 24 hours) at 09/12/2021 0846 ?Last data filed at 09/12/2021 0400 ?Gross per 24 hour  ?Intake 1774.36 ml  ?Output 500 ml  ?Net 1274.36 ml  ? ? ?  09/12/2021  ?  5:00 AM 09/11/2021  ?  4:10 AM 09/14/2021  ? 10:00 PM  ?Last 3 Weights  ?Weight (lbs) 160 lb 7.9 oz 160 lb 7.9 oz 159 lb 13.3 oz  ?Weight (kg) 72.8 kg 72.8 kg 72.5 kg  ?    ? ?Telemetry  ?  ?Atrial flutter- Personally Reviewed ? ?ECG  ?  ?None new- Personally Reviewed ? ?Physical Exam  ? ?GEN: No acute distress.   ?Neck: JVD to the angle of the jaw when sitting at 30 degrees ?Cardiac: Tachycardic, regular, no murmurs, rubs, or gallops.  ?Respiratory: Clear to auscultation bilaterally. ?GI: Soft, nontender, non-distended  ?MS: No edema; No deformity. ?Neuro:  Nonfocal  ?Psych: Normal affect  ? ?Labs  ?  ?High Sensitivity Troponin:   ?Recent Labs  ?Lab 09/26/2021 ?1507 09/15/2021 ?1645  ?TROPONINIHS 42* 40*  ?   ?Chemistry ?Recent Labs  ?Lab 09/21/2021 ?1507 09/07/2021 ?1645 09/11/21 ?9563 09/12/21 ?0303  ?NA 143  --  143 140  ?K 4.0  --  4.0 4.1  ?CL 87*  --  89* 86*  ?CO2 >45*  --  >45* >45*  ?GLUCOSE 176*  --  139* 120*  ?BUN 21  --  16 25*  ?CREATININE 0.83  --  0.73 0.97  ?CALCIUM 8.9  --  8.7* 8.8*  ?MG  --  2.3  --   --   ?PROT  8.2*  --  7.2  --   ?ALBUMIN 3.9  --  3.5  --   ?AST 31  --  18  --   ?ALT 17  --  14  --   ?ALKPHOS 65  --  59  --   ?BILITOT 1.3*  --  0.7  --   ?GFRNONAA >60  --  >60 >60  ?ANIONGAP NOT CALCULATED  --  NOT CALCULATED NOT CALCULATED  ?  ?Lipids No results for input(s): CHOL, TRIG, HDL, LABVLDL, LDLCALC, CHOLHDL in the last 168 hours.  ?Hematology ?Recent Labs  ?Lab 09/12/2021 ?1507 09/11/21 ?4431 09/12/21 ?0303  ?WBC 9.4 8.1 8.1  ?RBC 4.12* 3.72* 3.75*  ?HGB 11.8* 10.5* 10.7*  ?HCT 40.2 36.7* 36.6*  ?MCV 97.6 98.7 97.6  ?MCH 28.6 28.2 28.5  ?MCHC 29.4* 28.6* 29.2*  ?RDW 14.9 14.8 14.9  ?PLT 183 171 195  ? ?Thyroid  ?Recent Labs  ?Lab 09/11/21 ?0312  ?TSH 0.204*  ?FREET4 1.04  ?  ?BNP ?Recent Labs  ?Lab 09/12/2021 ?1508  ?BNP 200.6*  ?  ?DDimer No results for input(s): DDIMER in the last 168 hours.  ? ?Radiology  ?  ?CT Angio Chest PE W and/or Wo Contrast ? ?Result Date: 09/19/2021 ?CLINICAL DATA:  Hemoptysis beginning yesterday. Acute abdominal pain. Constipation. Non-small cell lung carcinoma. EXAM: CT ANGIOGRAPHY CHEST CT ABDOMEN AND PELVIS WITH CONTRAST TECHNIQUE:  Multidetector CT imaging of the chest was performed using the standard protocol during bolus administration of intravenous contrast. Multiplanar CT image reconstructions and MIPs were obtained to evaluate the vascular anatomy. Multidetector CT imaging of the abdomen and pelvis was performed using the standard protocol during bolus administration of intravenous contrast. RADIATION DOSE REDUCTION: This exam was performed according to the departmental dose-optimization program which includes automated exposure control, adjustment of the mA and/or kV according to patient size and/or use of iterative reconstruction technique. CONTRAST:  178mL OMNIPAQUE IOHEXOL 350 MG/ML SOLN COMPARISON:  Chest CTA on 06/09/2021, and PET-CT on 07/07/2021 FINDINGS: CTA CHEST FINDINGS Cardiovascular: Satisfactory opacification of pulmonary arteries noted, and no pulmonary emboli identified. No evidence of thoracic aortic dissection or aneurysm. Aortic atherosclerotic calcification noted. Mediastinum/Nodes: No masses or pathologically enlarged lymph nodes identified. Lungs/Pleura: Stable postop changes from left upper lobectomy. Left lower lobe consolidation and volume loss with air bronchograms shows no significant change. A small pneumothorax is seen in the left lung apex which is new since previous study and suspicious for bronchopleural fistula. Compensatory hyperinflation of the right lung is again demonstrated, with severe emphysema. A spiculated nodule in the central right middle lobe on image 83/4 measures 16 x 13 mm, compared to 11 x 9 mm previously. Previously seen patchy areas of airspace opacity in the right upper and middle lobes has nearly completely resolved. No evidence of pleural effusion. Musculoskeletal: No suspicious bone lesions identified. Review of the MIP images confirms the above findings. CT ABDOMEN and PELVIS FINDINGS Hepatobiliary: No hepatic masses identified. Gallbladder is unremarkable. No evidence of biliary  ductal dilatation. Pancreas:  No mass or inflammatory changes. Spleen: Within normal limits in size and appearance. Adrenals/Urinary Tract: No masses identified. Benign-appearing right renal cyst again noted (no followup imaging is recommended). no evidence of ureteral calculi or hydronephrosis. Unremarkable unopacified urinary bladder. Stomach/Bowel: No evidence of obstruction, inflammatory process or abnormal fluid collections. Diverticulosis is seen mainly involving the descending and sigmoid colon, however there is no evidence of diverticulitis. Vascular/Lymphatic: No pathologically enlarged lymph nodes. No acute vascular findings. Aortic atherosclerotic calcification  noted. Reproductive:  Stable mildly enlarged prostate. Other:  None. Musculoskeletal:  No suspicious bone lesions identified. Review of the MIP images confirms the above findings. IMPRESSION: No evidence of pulmonary embolism. New small left apical pneumothorax, suspicious for bronchopleural fistula. Previous left upper lobectomy, with no significant change in left lower lobe consolidation and volume loss. Increased size of 16 mm spiculated nodule in central right middle lobe, highly suspicious for bronchogenic carcinoma. No acute findings or evidence of metastatic disease within the abdomen or pelvis. Colonic diverticulosis, without radiographic evidence of diverticulitis. Stable mildly enlarged prostate. Aortic Atherosclerosis (ICD10-I70.0) and Emphysema (ICD10-J43.9). Electronically Signed   By: Marlaine Hind M.D.   On: 09/11/2021 17:33  ? ?CT ABDOMEN PELVIS W CONTRAST ? ?Result Date: 09/18/2021 ?CLINICAL DATA:  Hemoptysis beginning yesterday. Acute abdominal pain. Constipation. Non-small cell lung carcinoma. EXAM: CT ANGIOGRAPHY CHEST CT ABDOMEN AND PELVIS WITH CONTRAST TECHNIQUE: Multidetector CT imaging of the chest was performed using the standard protocol during bolus administration of intravenous contrast. Multiplanar CT image reconstructions  and MIPs were obtained to evaluate the vascular anatomy. Multidetector CT imaging of the abdomen and pelvis was performed using the standard protocol during bolus administration of intravenous contrast. R

## 2021-09-12 NOTE — Progress Notes (Signed)
eLink Physician-Brief Progress Note ?Patient Name: Jeffrey Hines ?DOB: 10-19-1945 ?MRN: [id number] ? ? ?Date of Service ? 09/12/2021  ?HPI/Events of Note ? Notified that patient is agitated, not tolerating BiPap. He requests for medication to calm him.  ?eICU Interventions ? Ordered a trial of morphine 0.5 mg IV  ? ? ? ?Intervention Category ?Minor Interventions: Agitation / anxiety - evaluation and management ? ?Shona Needles Nicolas Sisler ?09/12/2021, 2:59 AM ?

## 2021-09-12 NOTE — Progress Notes (Addendum)
Per pt request he was taken off BiPap and placed on 7L[place]. He was coached on pursed lip breathing. O2 Sats 95%.  ?

## 2021-09-12 NOTE — Progress Notes (Signed)
?PROGRESS NOTE ?  ?Jeffrey Hines  JME:[id number]    DOB: [birth year]    DOA: 09/14/2021 ? ?PCP: [facility]  ? ?I have briefly reviewed patients previous medical records in [facility]. ? ?Chief Complaint  ?Patient presents with  ? Hemoptysis  ? Constipation  ? ? 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 followed by left upper lobectomy and left lower lobe wedge resection which left him with residual left apical necrotic area and distorted anatomy of the left lower lobe resulting in mediastinal shift to the left side, new onset A-fib with RVR and community-acquired pneumonia in January 2023, started newly on Eliquis and inhaled tobramycin and at which time CT of the chest showed complete opacification of the left lung suggesting blocked airway to the left lower lobe and a new right middle lobe lung nodule suspicious for primary or metastatic lung cancer, high risk repeat bronchoscopy seen on 4/6 by radiation oncology for SBRT to new RML lung nodule, HTN, anxiety, RLS,?  PE, presented with 3 to 4 days history of frank hemoptysis of around 1/4-1/2 cup per day and worsening dyspnea.  As per PCCM read of CTA chest, no PE, persistent left lung whiteout, left upper lobe loculated pneumothorax and no meds in the abdomen or pelvis.  Admitted to stepdown unit for hemoptysis, acute on chronic respiratory failure with hypoxia and hypercapnia, COPD exacerbation and A-fib with RVR.  PCCM and cardiology consulted.  Tenuous respiratory status, overall extremely poor prognosis, bronchoscopy with prohibitive risk and no procedures planned.  All providers strongly feel he should be DNR, DNI and hospice, spouse on board but patient not there yet.  Palliative consulted by PCCM and input  pending. ? ?Assessment and Plan: ?* Hemoptysis ?Non-small cell lung cancer, squamous cell cancer ?S/p left upper lobectomy and left lower lobe wedge resection ?Left lower lobe collapse due to obstruction, new since January 2023 with left lung whiteout ?Highly suspected RML new lung cancer versus metastasis ?Hemoptysis may be related to underlying lung cancer, left apical necrotic lesion versus others in the context of Eliquis. ?Eliquis held. ?PCCM consultation appreciated and are considering very very high risk bronchoscopy to evaluate.  Indicate no role for IR for massive hemoptysis and palliative if develops massive hemoptysis. ?BiPAP as needed. ?IV antibiotics and steroids, bronchodilator nebs ?Chest x2: Negative to date.  RVP panel negative.  MRSA PCR negative and vancomycin discontinued.  Continue cefepime.  Blood cultures x2: Negative to date. ?Hemoptysis much improved but overall respiratory status remains tenuous. ?All providers strongly feel he should be DNR, DNI and hospice, spouse on board but patient not there yet.  Palliative consulted by PCCM and input pending. ? ?Atrial flutter with rapid ventricular response ([place]) ?History of A-fib on Eliquis PTA ?CHA2DS2-VASc score: 3 ?Eliquis held due to significant hemoptysis ?TSH 0.204 but free T4 normal at 1.04. ?Remains on maximum dose of IV Cardizem 15 mg/h and amiodarone at 30 and heart rate has improved to the 130s. ?EP cardiology follow-up appreciated.  Discussed with Dr. Curt Bears, unfortunately not much options, RV significantly dilated by echo, recommends hospice. ?Given reported low urine output, poor oral intake and severe RV dilatation/severely reduced RV function/RV failure, initiated gentle IV fluids which may help control rate ? ?Acute on chronic respiratory failure with hypoxia (HCC) ?On baseline home oxygen 5 to  6 L/min at home ?Presented with worsening dyspnea ?Secondary to complex lung situation including hemoptysis, COPD exacerbation, other  significant lung findings as noted above. ?Currently on HFNC 7 L/min ?Per pulmonology, BiPAP as needed. ?Remains full code and PCCM discussing [place] with patient and spouse. ?Not able to tolerate BiPAP consistently. ?All providers strongly feel he should be DNR, DNI and hospice, spouse on board but patient not there yet.  Palliative consulted by PCCM and input pending. ?Added as needed morphine solution for severe dyspnea. ? ?COPD exacerbation ([place]) ?Continue prednisone 40 Mg daily ?Brovana nebs twice daily and albuterol nebs as needed ?Flutter valve. ? ?Hypertension ?Not on antihypertensives PTA. ?Only on Lasix, continued ?Controlled.  Monitor closely while on Cardizem drip. ? ?Anxiety state ?Continue clonazepam as needed. ? ?Restless leg syndrome ?Continue ropinirole. ? ?Constipation ?No BM for approximately a week PTA. ?Aggressive bowel regimen. ?Still no BM.  Ordered Dulcolax suppository and if it does not work then consider Fleet enema. ? ?Normocytic anemia ?Suspected due to malignancy, chronic disease ?Stable. ? ? ?Body mass index is 22.38 kg/m?Marland Kitchen ?Nutritional Status ?Nutrition Problem: Increased nutrient needs ?Etiology: chronic illness (COPD) ?Signs/Symptoms: estimated needs ?Interventions: Ensure Enlive (each supplement provides 350kcal and 20 grams of protein), MVI ?Pressure Ulcer: ?  ? ?DVT prophylaxis: SCDs Start: 09/23/2021 1840   ?  Code Status: Full Code:  ?Family Communication: None at bedside ?Disposition:  ?TBD. ?  ? ?Consultants:   ?PCCM ?Cardiology ?Palliative care medicine: Pending ? ?Procedures:   ?BiPAP ? ?Antimicrobials:   ?IV cefepime and vancomycin 4/7 > ? ? ?Subjective:  ?Interviewed and examined patient along with RN at bedside.  Reports that he had a rough night, did not get to sleep much.  Feels tired.  Intermittent cough but not bringing up much.  Ongoing dyspnea.  No BM.  RN reported decreased urine output 300 in the daytime yesterday, 200 overnight.  IVF initiated. ?Objective:  ? ?Vitals:   ? 09/12/21 0800 09/12/21 0849 09/12/21 0850 09/12/21 0900  ?BP: 126/85   128/82  ?Pulse: (!) 132  (!) 133 (!) 131  ?Resp: (!) 21  12 20   ?Temp:   98.1 ?F (36.7 ?C)   ?TempSrc:   Axillary   ?SpO2: 99% 94% 97% 95%  ?Weight:      ?Height:      ? ? ?General exam: Elderly male, moderately built and frail, chronically ill looking sitting propped up in bed looks a little better than yesterday, not in overt respiratory distress.  Still overall ill looking. ?Respiratory system: Harsh and reduced breath sounds bilaterally globally with some scattered coarse crackles, few rhonchi and?  Bilateral pleural rubs.  Not much change in findings compared to yesterday except does not appear to to be in respiratory distress.  Minimal to no hemoptysis and suction tubing or canister at bedside. ?Cardiovascular system: S1 & S2 heard, regular tachycardia. No JVD, murmurs, rubs, gallops or clicks. No pedal edema.  Telemetry personally reviewed: Atrial flutter with RVR in the 130s. ?Gastrointestinal system: Abdomen is nondistended, soft and nontender. No organomegaly or masses felt. Normal bowel sounds heard. ?Central nervous system: Alert and oriented. No focal neurological deficits. ?Extremities: Symmetric 5 x 5 power. ?Skin: No rashes, lesions or ulcers ?Psychiatry: Judgement and insight appear normal. Mood & affect appropriate.  ? ?Data Reviewed:   ?I have personally reviewed following labs and imaging studies ? ?CBC: ?Recent Labs  ?Lab 09/11/2021 ?1507 09/11/21 ?2440 09/12/21 ?0303  ?WBC 9.4 8.1 8.1  ?NEUTROABS 8.6*  --   --   ?  HGB 11.8* 10.5* 10.7*  ?HCT 40.2 36.7* 36.6*  ?MCV 97.6 98.7 97.6  ?PLT 183 171 195  ? ?Basic Metabolic Panel: ?Recent Labs  ?Lab 09/26/2021 ?1507 09/27/2021 ?1645 09/11/21 ?0762 09/12/21 ?0303  ?NA 143  --  143 140  ?K 4.0  --  4.0 4.1  ?CL 87*  --  89* 86*  ?CO2 >45*  --  >45* >45*  ?GLUCOSE 176*  --  139* 120*  ?BUN 21  --  16 25*  ?CREATININE 0.83  --  0.73 0.97  ?CALCIUM 8.9  --  8.7* 8.8*  ?MG  --  2.3  --   --    ? ?Liver Function Tests: ?Recent Labs  ?Lab 09/05/2021 ?1507 09/11/21 ?2633  ?AST 31 18  ?ALT 17 14  ?ALKPHOS 65 59  ?BILITOT 1.3* 0.7  ?PROT 8.2* 7.2  ?ALBUMIN 3.9 3.5  ? ?CBG: ?No results for input(s): GLUCAP in

## 2021-09-12 NOTE — Progress Notes (Signed)
SLP Cancellation Note ? ?Patient Details ?Name: Jeffrey Hines ?MRN: [id number] ?DOB: [birth year] ? ? ?Cancelled treatment:       Reason Eval/Treat Not Completed: SLP screened, no needs identified, will sign off. SLP spoke with patient, his wife and his RN regarding swallow function. RN reported that she thinks his episode of dysphagia with solid foods previous date was an isolated incident as he has not had any further problems. Patient's wife confirms this and she and patient both deny any recent or current difficulties with swallowing. SLP reviewed chart and patient had a 2021 MBS which was [facility]. SLP will s/o at this time but please reorder if any further concerns for dysphagia/aspiration. Thank you for this referral! ? ? ?Sonia Baller, MA, CCC-SLP ?Speech Therapy ? ?

## 2021-09-12 NOTE — Consult Note (Signed)
? ?                                                                                ?Consultation Note ?Date: 09/12/2021  ? ?Patient Name: Jeffrey Hines  ?DOB: 31-Aug-1945  MRN: [id number]  Age / Sex: 76 y.o., male  ?PCP: [facility] ?Referring Physician: Modena Jansky, MD ? ?Reason for Consultation: Establishing goals of care ? ?HPI/Patient Profile: 76 y.o. male  with past medical history of chronic respiratory failure with hypoxia and hypercapnia, COPD, bronchiectasis, stage IIb non-small cell lung cancer, squamous cell carcinoma status post chemoradiation and lobectomy, new onset A-fib with RVR and community-acquired pneumonia in January, left lung opacification, new discovery of RML lung nodule, right ventricular failure admitted on 10/01/2021 with hemoptysis, acute on chronic respiratory failure with hypoxemia, COPD exacerbation and A-fib with RVR.  Palliative consulted for goals of care. ? ?Clinical Assessment and Goals of Care: ?I met today with Jeffrey Hines and his wife.  ? ?I introduced palliative care as specialized medical care for people living with serious illness. It focuses on providing relief from the symptoms and stress of a serious illness. The goal is to improve quality of life for both the patient and the family. ? ?We discussed his understanding of his disease and he tells me that he knows that he has multiple comorbidities that cannot be cured and his prognosis is quite limited at this point in time.  He has had multiple discussions with other providers about options for care moving forward and his wife interjects that they have also been discussing and there is fear about "giving up" if he were to forego heroic interventions. ? ?We discussed the things most important to him which include his family, ensuring that he is not suffering, and working to get home.  He wants to be at home when the time comes that he dies and he indicates wanting to have a natural death when it  occurs. ? ?We discussed clinical course as well as wishes moving forward in regard to advanced directives.  Concepts specific to code status and rehospitalization discussed.  We discussed difference between a aggressive medical intervention path and a palliative, comfort focused care path.   ? ?We discussed that in light of multiple chronic medical problems that have worsened with this acute problem, care should be focused on interventions that are likely to allow the patient to achieve goal of getting back to home and spending time with family. I discussed with him regarding heroic interventions at the end-of-life and they agree this would not be in line with prior expressed wishes for a natural death or be likely to lead to getting well enough to go back home. They were in agreement with changing CODE STATUS to DO NOT RESUSCITATE. ? ?Concept of hospice was discussed including the fact that the goal of hospice is to focus on symptom management to improve quality of life is much as possible in light of the incurable illness.  We discussed how hospice services would likely lined up with his stated goals of being at home and dying at home. ? ?Following discussion, he was agreeable to exploring options for home  hospice and what agency services area.  I let him know that I would reach out to [facility] tomorrow to begin this process. ? ?Questions and concerns addressed.   PMT will continue to support holistically. ? ?SUMMARY OF RECOMMENDATIONS   ?-DNR/DNI ?-Continue other current interventions. ?-Discussed potential for home with hospice.  I am going to call and discuss further with TOC in the a.m. to determine what are options for home hospice services. ? ?Psycho-social/Spiritual:  ?Desire for further Chaplaincy support: Did not address today ?Additional Recommendations: Education on Hospice ? ?Prognosis:  ?Likely weeks. ? ?Discharge Planning: To be determined.  Today we discussed potential home hospice.  Will call TOC  tomorrow to discuss options for home hospice at their home in [facility]) ? ?  ? ?Primary Diagnoses: ?Present on Admission: ? Squamous cell carcinoma of lung, stage II ([place]) ? Acute on chronic respiratory failure with hypoxia (HCC) ? Hypertension ? COPD exacerbation ([place]) ? Right middle lobe pulmonary nodule ? Anxiety state ? Restless leg syndrome ? ? ?I have reviewed the medical record, interviewed the patient and family, and examined the patient. The following aspects are pertinent. ? ?Past Medical History:  ?Diagnosis Date  ? Anxiety   ? Cancer [facility])   ? Chronic lower back pain   ? Closed head injury 1998  ? Constipation due to opioid therapy   ? COPD (chronic obstructive pulmonary disease) ([place])   ? COPD with chronic bronchitis ([place])   ? Full dentures   ? GERD (gastroesophageal reflux disease)   ? Hemoptysis 08/03/2014  ? Hypertension   ? Multiple rib fractures 1998  ? left side  ? On supplemental oxygen therapy   ? 2L of O2 at night  ? Post-obstruction pneumonia due to Pseudomonas aeruginosa 08/07/2014  ? Endobronchial mass causing LUL obstruction  ? Radiation 08/25/14-09/29/14  ? left upper central lung 45 Gy  ? Seizures ([place])   ? Shortness of breath dyspnea   ? Shoulder dislocation 1998  ? left  ? Spontaneous pneumothorax 08/03/2014  ? Left side 1st time episode spontaneous pneumothorax associated with acute flare of COPD   ? Tobacco abuse   ? ?Social History  ? ?Socioeconomic History  ? Marital status: Married  ?  Spouse name: Not on file  ? Number of children: 1  ? Years of education: Not on file  ? Highest education level: Not on file  ?Occupational History  ? Occupation: Engineering geologist  ?Tobacco Use  ? Smoking status: Former  ?  Packs/day: 0.50  ?  Years: 50.00  ?  Pack years: 25.00  ?  Types: Cigarettes  ?  Quit date: 08/03/2014  ?  Years since quitting: 7.1  ? Smokeless tobacco: Never  ?Vaping Use  ? Vaping Use: Never used  ?Substance and Sexual Activity  ? Alcohol use: No  ?  Alcohol/week:  0.0 standard drinks  ?  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  ? Drug use: No  ? Sexual activity: Yes  ?  Partners: Male  ?Other Topics Concern  ? Not on file  ?Social History Narrative  ? Not on file  ? ?Social Determinants of Health  ? ?Financial Resource Strain: Not on file  ?Food Insecurity: Not on file  ?Transportation Needs: Not on file  ?Physical Activity: Not on file  ?Stress: Not on file  ?Social Connections: Not on file  ? ?Family History  ?Problem Relation Age of Onset  ? Lung cancer  Mother   ? Alzheimer's disease Father   ? ?Scheduled Meds: ? arformoterol  15 mcg Nebulization BID  ? Chlorhexidine Gluconate Cloth  6 each Topical Daily  ? docusate sodium  100 mg Oral BID  ? feeding supplement  237 mL Oral TID BM  ? mouth rinse  15 mL Mouth Rinse BID  ? multivitamin   Oral BID  ? polyethylene glycol  17 g Oral BID  ? predniSONE  40 mg Oral Q breakfast  ? rOPINIRole  2 mg Oral QHS  ? senna  1 tablet Oral BID  ? sodium chloride flush  3 mL Intravenous Q12H  ? tamsulosin  0.4 mg Oral Daily  ? ?Continuous Infusions: ? amiodarone 30 mg/hr (09/12/21 1500)  ? ceFEPime (MAXIPIME) IV Stopped (09/12/21 1108)  ? diltiazem (CARDIZEM) infusion 15 mg/hr (09/12/21 1500)  ? lactated ringers 75 mL/hr at 09/12/21 1500  ? ?PRN Meds:.acetaminophen **OR** acetaminophen, albuterol, bisacodyl, clonazePAM, morphine, polyethylene glycol, sodium chloride, sodium phosphate ?Medications Prior to Admission:  ?Prior to Admission medications   ?Medication Sig Start Date End Date Taking? Authorizing Provider  ?acetaminophen (TYLENOL) 500 MG tablet Take 1,000 mg by mouth every 8 (eight) hours as needed for moderate pain.   Yes [provider]  ?albuterol (PROVENTIL) (2.5 MG/3ML) 0.083% nebulizer solution Take 3 mLs (2.5 mg total) by nebulization every 2 (two) hours as needed for wheezing or shortness of breath. 12/20/19  Yes Nita Sells, MD  ?albuterol (VENTOLIN HFA) 108 (90 Base) MCG/ACT inhaler  Inhale 2 puffs into the lungs every 6 (six) hours as needed for wheezing or shortness of breath.   Yes [provider]  ?apixaban (ELIQUIS) 5 MG TABS tablet Take 1 tablet (5 mg total) by mouth 2 (two) times daily.

## 2021-09-13 DIAGNOSIS — C3490 Malignant neoplasm of unspecified part of unspecified bronchus or lung: Secondary | ICD-10-CM | POA: Diagnosis not present

## 2021-09-13 DIAGNOSIS — R042 Hemoptysis: Secondary | ICD-10-CM | POA: Diagnosis not present

## 2021-09-13 DIAGNOSIS — G9341 Metabolic encephalopathy: Secondary | ICD-10-CM

## 2021-09-13 DIAGNOSIS — I4892 Unspecified atrial flutter: Secondary | ICD-10-CM | POA: Diagnosis not present

## 2021-09-13 DIAGNOSIS — J9621 Acute and chronic respiratory failure with hypoxia: Secondary | ICD-10-CM | POA: Diagnosis not present

## 2021-09-13 DIAGNOSIS — R0603 Acute respiratory distress: Secondary | ICD-10-CM

## 2021-09-13 DIAGNOSIS — R918 Other nonspecific abnormal finding of lung field: Secondary | ICD-10-CM | POA: Diagnosis not present

## 2021-09-13 LAB — BASIC METABOLIC PANEL
Anion gap: 4 — ABNORMAL LOW (ref 5–15)
BUN: 23 mg/dL (ref 8–23)
CO2: 44 mmol/L — ABNORMAL HIGH (ref 22–32)
Calcium: 8.7 mg/dL — ABNORMAL LOW (ref 8.9–10.3)
Chloride: 88 mmol/L — ABNORMAL LOW (ref 98–111)
Creatinine, Ser: 0.76 mg/dL (ref 0.61–1.24)
GFR, Estimated: >60 mL/min (ref >60)
Glucose, Bld: 134 mg/dL — ABNORMAL HIGH (ref 70–99)
Potassium: 4.2 mmol/L (ref 3.5–5.1)
Sodium: 137 mmol/L (ref 135–145)

## 2021-09-13 LAB — GLUCOSE, CAPILLARY: Glucose-Capillary: 166 mg/dL — ABNORMAL HIGH (ref 70–99)

## 2021-09-13 MED ORDER — BIOTENE DRY MOUTH MT LIQD: 15.0000 mL | OROMUCOSAL | Status: DC | PRN

## 2021-09-13 MED ORDER — HALOPERIDOL 0.5 MG PO TABS: 0.5000 mg | ORAL_TABLET | ORAL | Status: DC | PRN

## 2021-09-13 MED ORDER — MORPHINE BOLUS VIA INFUSION
2.0000 mg | INTRAVENOUS | Status: DC | PRN
  Filled 2021-09-13: qty 2

## 2021-09-13 MED ORDER — POLYVINYL ALCOHOL 1.4 % OP SOLN
1.0000 [drp] | Freq: Four times a day (QID) | OPHTHALMIC | Status: DC | PRN
  Filled 2021-09-13: qty 15

## 2021-09-13 MED ORDER — ONDANSETRON 4 MG PO TBDP: 4.0000 mg | ORAL_TABLET | Freq: Four times a day (QID) | ORAL | Status: DC | PRN

## 2021-09-13 MED ORDER — LORAZEPAM 2 MG/ML IJ SOLN: 1.0000 mg | INTRAMUSCULAR | Status: DC | PRN

## 2021-09-13 MED ORDER — LORAZEPAM 1 MG PO TABS: 1.0000 mg | ORAL_TABLET | ORAL | Status: DC | PRN

## 2021-09-13 MED ORDER — MORPHINE 100MG IN NS 100ML (1MG/ML) PREMIX INFUSION
1.0000 mg/h | INTRAVENOUS | Status: DC
  Administered 2021-09-13: 1 mg/h via INTRAVENOUS
  Filled 2021-09-13: qty 100

## 2021-09-13 MED ORDER — GLYCOPYRROLATE 1 MG PO TABS: 1.0000 mg | ORAL_TABLET | ORAL | Status: DC | PRN

## 2021-09-13 MED ORDER — MORPHINE SULFATE (PF) 2 MG/ML IV SOLN
2.0000 mg | INTRAVENOUS | Status: DC | PRN
Start: 2021-09-13 — End: 2021-09-14

## 2021-09-13 MED ORDER — GLYCOPYRROLATE 0.2 MG/ML IJ SOLN: 0.2000 mg | INTRAMUSCULAR | Status: DC | PRN

## 2021-09-13 MED ORDER — GLYCOPYRROLATE 0.2 MG/ML IJ SOLN
0.4000 mg | INTRAMUSCULAR | Status: DC
  Administered 2021-09-13: 0.4 mg via INTRAVENOUS
  Filled 2021-09-13: qty 2

## 2021-09-13 MED ORDER — ONDANSETRON HCL 4 MG/2ML IJ SOLN: 4.0000 mg | Freq: Four times a day (QID) | INTRAMUSCULAR | Status: DC | PRN

## 2021-09-13 MED ORDER — HALOPERIDOL LACTATE 2 MG/ML PO CONC
0.5000 mg | ORAL | Status: DC | PRN
  Filled 2021-09-13: qty 0.3

## 2021-09-13 MED ORDER — GLYCOPYRROLATE 0.2 MG/ML IJ SOLN
0.2000 mg | INTRAMUSCULAR | Status: DC | PRN
  Administered 2021-09-13: 0.2 mg via INTRAVENOUS
  Filled 2021-09-13: qty 1

## 2021-09-13 MED ORDER — HALOPERIDOL LACTATE 5 MG/ML IJ SOLN: 0.5000 mg | INTRAMUSCULAR | Status: DC | PRN

## 2021-09-13 MED ORDER — LORAZEPAM 2 MG/ML PO CONC: 1.0000 mg | ORAL | Status: DC | PRN

## 2021-09-15 LAB — CULTURE, BLOOD (ROUTINE X 2)
Culture: NO GROWTH
Culture: NO GROWTH
Special Requests: ADEQUATE
Special Requests: ADEQUATE

## 2021-10-04 NOTE — Progress Notes (Incomplete)
? ?Progress Note ? ?Patient Name: Jeffrey Hines ?Date of Encounter: 15-Sep-2021 ? ?[place] Cardiologist: None *** ? ?Subjective  ? ?*** ? ?Inpatient Medications  ?  ?Scheduled Meds: ? arformoterol  15 mcg Nebulization BID  ? Chlorhexidine Gluconate Cloth  6 each Topical Daily  ? docusate sodium  100 mg Oral BID  ? feeding supplement  237 mL Oral TID BM  ? mouth rinse  15 mL Mouth Rinse BID  ? multivitamin   Oral BID  ? polyethylene glycol  17 g Oral BID  ? predniSONE  40 mg Oral Q breakfast  ? rOPINIRole  2 mg Oral QHS  ? senna  1 tablet Oral BID  ? sodium chloride flush  3 mL Intravenous Q12H  ? tamsulosin  0.4 mg Oral Daily  ? ?Continuous Infusions: ? sodium chloride Stopped (09/15/21 0457)  ? amiodarone 30 mg/hr (09/15/2021 0700)  ? ceFEPime (MAXIPIME) IV Stopped (2021-09-15 0308)  ? diltiazem (CARDIZEM) infusion 15 mg/hr (15-Sep-2021 0718)  ? ?PRN Meds: ?sodium chloride, acetaminophen **OR** acetaminophen, albuterol, bisacodyl, clonazePAM, morphine, polyethylene glycol, sodium chloride, sodium phosphate  ? ?Vital Signs  ?  ?Vitals:  ? 2021/09/15 0400 September 15, 2021 0700 09-15-21 0800 09-15-21 0803  ?BP: 129/74  131/87   ?Pulse: (!) 125 (!) 122 (!) 123   ?Resp: 19 (!) 25 (!) 25   ?Temp:    97.6 ?F (36.4 ?C)  ?TempSrc:    Oral  ?SpO2: 92% 100% 96%   ?Weight:      ?Height:      ? ? ?Intake/Output Summary (Last 24 hours) at 2021/09/15 0858 ?Last data filed at 15-Sep-2021 0700 ?Gross per 24 hour  ?Intake 3496.74 ml  ?Output 750 ml  ?Net 2746.74 ml  ? ? ?  09/12/2021  ?  5:00 AM 09/11/2021  ?  4:10 AM 09/23/2021  ? 10:00 PM  ?Last 3 Weights  ?Weight (lbs) 160 lb 7.9 oz 160 lb 7.9 oz 159 lb 13.3 oz  ?Weight (kg) 72.8 kg 72.8 kg 72.5 kg  ?   ? ?Telemetry  ?  ?*** - Personally Reviewed ? ?ECG  ?  ?*** - Personally Reviewed ? ?Physical Exam  ?*** ?GEN: No acute distress.   ?Neck: No JVD ?Cardiac: RRR, no murmurs, rubs, or gallops.  ?Respiratory: Clear to auscultation bilaterally. ?GI: Soft, nontender, non-distended  ?MS: No edema; No  deformity. ?Neuro:  Nonfocal  ?Psych: Normal affect  ? ?Labs  ?  ?High Sensitivity Troponin:   ?Recent Labs  ?Lab 09/12/2021 ?1507 09/09/2021 ?1645  ?TROPONINIHS 42* 40*  ?   ?Chemistry ?Recent Labs  ?Lab 09/23/2021 ?1507 09/20/2021 ?1645 09/11/21 ?4315 09/12/21 ?0303 2021/09/15 ?0254  ?NA 143  --  143 140 137  ?K 4.0  --  4.0 4.1 4.2  ?CL 87*  --  89* 86* 88*  ?CO2 >45*  --  >45* >45* 44*  ?GLUCOSE 176*  --  139* 120* 134*  ?BUN 21  --  16 25* 23  ?CREATININE 0.83  --  0.73 0.97 0.76  ?CALCIUM 8.9  --  8.7* 8.8* 8.7*  ?MG  --  2.3  --   --   --   ?PROT 8.2*  --  7.2  --   --   ?ALBUMIN 3.9  --  3.5  --   --   ?AST 31  --  18  --   --   ?ALT 17  --  14  --   --   ?ALKPHOS 65  --  59  --   --   ?BILITOT 1.3*  --  0.7  --   --   ?GFRNONAA >60  --  >60 >60 >60  ?ANIONGAP NOT CALCULATED  --  NOT CALCULATED NOT CALCULATED 4*  ?  ?Lipids No results for input(s): CHOL, TRIG, HDL, LABVLDL, LDLCALC, CHOLHDL in the last 168 hours.  ?Hematology ?Recent Labs  ?Lab 09/27/2021 ?1507 09/11/21 ?3818 09/12/21 ?0303  ?WBC 9.4 8.1 8.1  ?RBC 4.12* 3.72* 3.75*  ?HGB 11.8* 10.5* 10.7*  ?HCT 40.2 36.7* 36.6*  ?MCV 97.6 98.7 97.6  ?MCH 28.6 28.2 28.5  ?MCHC 29.4* 28.6* 29.2*  ?RDW 14.9 14.8 14.9  ?PLT 183 171 195  ? ?Thyroid  ?Recent Labs  ?Lab 09/11/21 ?0312  ?TSH 0.204*  ?FREET4 1.04  ?  ?BNP ?Recent Labs  ?Lab 09/16/2021 ?1508  ?BNP 200.6*  ?  ?DDimer No results for input(s): DDIMER in the last 168 hours.  ? ?Radiology  ?  ?DG CHEST PORT 1 VIEW ? ?Result Date: 09/11/2021 ?CLINICAL DATA:  Oxygen desaturation EXAM: PORTABLE CHEST 1 VIEW COMPARISON:  09/29/2021 FINDINGS: Unchanged AP portable chest radiograph with nearly complete consolidation and volume loss of the left hemithorax, with cavitation at the left apex. Diffuse heterogeneous and interstitial opacity of the right lung. Heart and mediastinal contours are obscured. IMPRESSION: 1. Unchanged AP portable chest radiograph with nearly complete consolidation and volume loss of the left hemithorax,  with cavitation at the left apex. 2. Diffuse heterogeneous and interstitial opacity of the right lung. Electronically Signed   By: Delanna Ahmadi M.D.   On: 09/11/2021 14:10  ? ?ECHOCARDIOGRAM LIMITED BUBBLE STUDY ? ?Result Date: 09/11/2021 ?   ECHOCARDIOGRAM LIMITED REPORT   Patient Name:   SCOUT GUMBS Date of Exam: 09/11/2021 Medical Rec #:  [id number]           Height:       71.0 in Accession #:    [id number]          Weight:       160.5 lb Date of Birth:  [birth year]           BSA:          1.920 m? Patient Age:    76 years            BP:           115/83 mmHg Patient Gender: M                   HR:           145 bpm. Exam Location:  Inpatient Procedure: Limited Echo, Cardiac Doppler, Color Doppler and Saline Contrast            Bubble Study Indications:    Rule out PFO Dyspnea  History:        Patient has prior history of Echocardiogram examinations, most                 recent 09/10/2019. COPD, Arrythmias:Tachycardia, Atrial Flutter                 and Atrial Fibrillation; Signs/Symptoms:Shortness of Breath.                 Lung cancer S/P left lobectomy. Agent orange exposure.  Sonographer:    [name] RDCS Referring Phys: [place]  1. Normal LV size. LV function appears preserved, very tachycardic.  2. Severely Dilated RV. Severely Reduced RV function. Flattened  septum. RV failure . There is moderately elevated pulmonary artery systolic pressure.  3. Right atrial size was severely dilated.  4. Aortic valve regurgitation is not visualized.  5. Dilated IVC. The inferior vena cava is dilated in size with >50% respiratory variability, suggesting right atrial pressure of 8 mmHg.  6. Agitated saline contrast bubble study was negative, with no evidence of any interatrial shunt. FINDINGS  Left Ventricle: Normal LV size. LV function appears preserved, very tachycardic. Right Ventricle: Severely Dilated RV. Severely Reduced RV function. Flattened septum. RV failure. There is moderately  elevated pulmonary artery systolic pressure. The tricuspid regurgitant velocity is 3.31 m/s, and with an assumed right atrial pressure of 8 mmHg, the estimated right ventricular systolic pressure is 10.3 mmHg. Left Atrium: Left atrial size was normal in size. Right Atrium: Right atrial size was severely dilated. Pericardium: There is no evidence of pericardial effusion. Tricuspid Valve: Tricuspid valve regurgitation is mild. Aortic Valve: Aortic valve regurgitation is not visualized. Venous: Dilated IVC. The inferior vena cava is dilated in size with greater than 50% respiratory variability, suggesting right atrial pressure of 8 mmHg. IAS/Shunts: Agitated saline contrast was given intravenously to evaluate for intracardiac shunting. Agitated saline contrast bubble study was negative, with no evidence of any interatrial shunt. RIGHT VENTRICLE          IVC RV Basal diam:  6.10 cm  IVC diam: 2.60 cm RV Mid diam:    5.30 cm LEFT ATRIUM             Index        RIGHT ATRIUM           Index LA Vol (A2C):   66.6 ml 34.68 ml/m?  RA Area:     34.30 cm? LA Vol (A4C):   50.9 ml 26.51 ml/m?  RA Volume:   163.00 ml 84.88 ml/m? LA Biplane Vol: 58.7 ml 30.57 ml/m?  TRICUSPID VALVE TR Peak grad:   43.8 mmHg TR Vmax:        331.00 cm/s Phineas Inches Electronically signed by Phineas Inches Signature Date/Time: 09/11/2021/12:09:26 PM    Final    ? ?Cardiac Studies  ? ?TTE 09/11/21 ? 1. Normal LV size. LV function appears preserved, very tachycardic.  ? 2. Severely Dilated RV. Severely Reduced RV function. Flattened septum.  ?RV failure . There is moderately elevated pulmonary artery systolic  ?pressure.  ? 3. Right atrial size was severely dilated.  ? 4. Aortic valve regurgitation is not visualized.  ? 5. Dilated IVC. The inferior vena cava is dilated in size with >50%  ?respiratory variability, suggesting right atrial pressure of 8 mmHg.  ? 6. Agitated saline contrast bubble study was negative, with no evidence  ?of any interatrial shunt.   ? ?Patient Profile  ?   ?76 y.o. male with COPD, hypertension, spontaneous pneumothorax, home oxygen, lung cancer status post left lower lobe resection with necrotic left lung who presented to the hospital with

## 2021-10-04 NOTE — Assessment & Plan Note (Signed)
As per RN, confusion at night, attempting to get out of bed and pulling at devices. ?Multifactorial due to hypoxia, polypharmacy, hospital delirium etc. ?Delirium precautions. ?

## 2021-10-04 NOTE — Progress Notes (Signed)
? ? ?  OVERNIGHT PROGRESS REPORT ? ?Notified by RN that patient has expired at 2042 ? ?Patient was DNR with comfort care track. ? ?2 RN verified. ? ?Family was immediately available to RN. ? ? ? ? ?Gershon Cull MSNA ACNPC-AG ?Acute Care Nurse Practitioner ?[facility] ?[place] ? ? ? ?

## 2021-10-04 NOTE — Consult Note (Signed)
[facility] CM Inpatient Consult ? ? ?2021/09/15 ? ?[name] ?1945-07-18 ?[id number] ? ?[facility] CM) ?  ?Patient chart reviewed for noted high risk score for unplanned readmission. Per review, patient for home with hospice services. No [facility] CM needs. ? ?Netta Cedars, MSN, RN ?[place] [facility] Liaison ?Phone [phone] ?Toll free office [phone]  ?

## 2021-10-04 NOTE — Death Summary Note (Signed)
? ?DEATH SUMMARY  ? ?Patient Details  ?Name: Jeffrey Hines ?MRN: [id number] ?DOB: 04/17/1946 ?ASN:[id number], [place] ?Admission/Discharge Information  ? ?Admit Date:  Sep 27, 2021  ?Date of Death: Date of Death: 2021/09/30  ?Time of Death: Time of Death: Aug 30, 2040  ?Length of Stay: 2  ? ?Principle Cause of death: Acute respiratory failure with hypoxia due to lung cancer (non-small cell lung cancer, squamous cell cancer) ? ?Hospital Diagnoses: ?Principal Problem: ?  Hemoptysis ?Active Problems: ?  Atrial flutter with rapid ventricular response ([place]) ?  Acute on chronic respiratory failure with hypoxia (HCC) ?  COPD exacerbation ([place]) ?  Hypertension ?  Anxiety state ?  Restless leg syndrome ?  Constipation ?  Right middle lobe pulmonary nodule ?  Squamous cell carcinoma of lung, stage II ([place]) ?  Acute metabolic encephalopathy ?  Normocytic anemia ? ? ?Hospital Course: ?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 followed by left upper lobectomy and left lower lobe wedge resection which left him with residual left apical necrotic area and distorted anatomy of the left lower lobe resulting in mediastinal shift to the left side, new onset A-fib with RVR and community-acquired pneumonia in January 2023, started newly on Eliquis and inhaled tobramycin and at which time CT of the chest showed complete opacification of the left lung suggesting blocked airway to the left lower lobe and a new right middle lobe lung nodule suspicious for primary or metastatic lung cancer, high risk repeat bronchoscopy seen on 4/6 by radiation oncology for SBRT to new RML lung nodule, HTN, anxiety, RLS,?  PE, presented with 3 to 4 days history of frank hemoptysis of around 1/4-1/2 cup per day and worsening dyspnea.  As per PCCM read of CTA chest,  no PE, persistent left lung whiteout, left upper lobe loculated pneumothorax and no meds in the abdomen or pelvis.  Admitted to stepdown unit for hemoptysis, acute on chronic respiratory failure with hypoxia and hypercapnia, COPD exacerbation and A-fib with RVR.  PCCM and cardiology consulted.  Tenuous respiratory status, overall extremely poor prognosis, bronchoscopy with prohibitive risk and no procedures planned.  Palliative care consulted.   ? ?On 2021-09-30, patient rapidly decompensated, palliative care followed up and did not feel that he could safely return home with hospice, transitioned him to full comfort care in the hospital and he demised on 09-30-2021. ? ? ?Following was the course of events during patient's terminal hospitalization. ? ?Assessment and Plan: ?* Hemoptysis ?Non-small cell lung cancer, squamous cell cancer ?S/p left upper lobectomy and left lower lobe wedge resection ?Left lower lobe collapse due to obstruction, new since January 2023 with left lung whiteout ?Highly suspected RML new lung cancer versus metastasis ?Hemoptysis may be related to underlying lung cancer, left apical necrotic lesion versus others in the context of Eliquis. ?Eliquis held. ?PCCM consultation appreciated and are considering very very high risk bronchoscopy to evaluate.  Indicate no role for IR for massive hemoptysis and palliative if develops massive hemoptysis. ?BiPAP as needed. ?IV antibiotics and steroids, bronchodilator nebs ?Chest x2: Negative to date.  RVP panel negative.  MRSA PCR negative and vancomycin discontinued.  Continue cefepime.  Blood cultures x2: Negative to date. ?Hemoptysis much improved but overall respiratory status remains tenuous. ?As per palliative care follow-up yesterday, patient now agreeable to going home with hospice and palliative team trying to coordinate same. ? ?  Atrial flutter with rapid ventricular response ([place]) ?History of A-fib on Eliquis PTA ?CHA2DS2-VASc score: 3 ?Eliquis held  due to significant hemoptysis ?TSH 0.204 but free T4 normal at 1.04. ?Remains on maximum dose of IV Cardizem 15 mg/h and amiodarone at 30 ?EP cardiology follow-up appreciated.  Discussed with Dr. Curt Bears, unfortunately not much options, RV significantly dilated by echo, recommends hospice. ?Given reported low urine output, poor oral intake and severe RV dilatation/severely reduced RV function/RV failure, initiated gentle IV fluids which may help control rate.  Completed 24 hours of IV fluids. ?Rate remains in RVR in the 120s. ? ?Acute on chronic respiratory failure with hypoxia (HCC) ?On baseline home oxygen 5 to 6 L/min at home ?Presented with worsening dyspnea ?Secondary to complex lung situation including hemoptysis, COPD exacerbation, other significant lung findings as noted above. ?Currently on HFNC 7 L/min ?Per pulmonology, BiPAP as needed. ?Remains full code and PCCM discussing [place] with patient and spouse. ?Not able to tolerate BiPAP consistently. ?Now DNR. ?Added as needed morphine solution for severe dyspnea. ?Ongoing dyspnea even while speaking.  On HFNC 7 L/min. ? ?COPD exacerbation ([place]) ?Continue prednisone 40 Mg daily ?Brovana nebs twice daily and albuterol nebs as needed ?Flutter valve. ? ?Hypertension ?Not on antihypertensives PTA. ?Only on Lasix, discontinued due to low urine output and clinically on the dry side ?Controlled.  Monitor closely while on Cardizem drip. ? ?Anxiety state ?Continue clonazepam as needed. ? ?Restless leg syndrome ?Continue ropinirole. ? ?Constipation ?No BM for approximately a week PTA. ?Aggressive bowel regimen. ?Minimal BM after Dulcolax suppository yesterday.  Discomfort from constipation and as per spouse may be affecting his breathing. ?Fleet enema, discussed with patient's RN. ? ?Normocytic anemia ?Suspected due to malignancy, chronic disease ?Stable. ? ?Acute metabolic encephalopathy ?As per RN, confusion at night, attempting to get out of bed and pulling at  devices. ?Multifactorial due to hypoxia, polypharmacy, hospital delirium etc. ?Delirium precautions. ? ? ? ? ?  ? ? ?Procedures: None ? ?Consultations: PCCM, palliative care medicine and cardiology ? ?The results of significant diagnostics from this hospitalization (including imaging, microbiology, ancillary and laboratory) are listed below for reference.  ? ?Significant Diagnostic Studies: ?CT Angio Chest PE W and/or Wo Contrast ? ?Result Date: 09/15/2021 ?CLINICAL DATA:  Hemoptysis beginning yesterday. Acute abdominal pain. Constipation. Non-small cell lung carcinoma. EXAM: CT ANGIOGRAPHY CHEST CT ABDOMEN AND PELVIS WITH CONTRAST TECHNIQUE: Multidetector CT imaging of the chest was performed using the standard protocol during bolus administration of intravenous contrast. Multiplanar CT image reconstructions and MIPs were obtained to evaluate the vascular anatomy. Multidetector CT imaging of the abdomen and pelvis was performed using the standard protocol during bolus administration of intravenous contrast. RADIATION DOSE REDUCTION: This exam was performed according to the departmental dose-optimization program which includes automated exposure control, adjustment of the mA and/or kV according to patient size and/or use of iterative reconstruction technique. CONTRAST:  153mL OMNIPAQUE IOHEXOL 350 MG/ML SOLN COMPARISON:  Chest CTA on 06/09/2021, and PET-CT on 07/07/2021 FINDINGS: CTA CHEST FINDINGS Cardiovascular: Satisfactory opacification of pulmonary arteries noted, and no pulmonary emboli identified. No evidence of thoracic aortic dissection or aneurysm. Aortic atherosclerotic calcification noted. Mediastinum/Nodes: No masses or pathologically enlarged lymph nodes identified. Lungs/Pleura: Stable postop changes from left upper lobectomy. Left lower lobe consolidation and volume loss with air bronchograms shows no significant change. A small pneumothorax is seen in the left lung apex which is new since previous  study and suspicious for bronchopleural fistula. Compensatory hyperinflation of the right lung is  again demonstrated, with severe emphysema. A spiculated nodule in the central right middle lobe on image 83/4 m

## 2021-10-04 NOTE — TOC Progression Note (Signed)
Transition of Care (TOC) - Progression Note  ? ? ?Patient Details  ?Name: Jeffrey Hines ?MRN: [id number] ?Date of Birth: Apr 05, 1946 ? ?Transition of Care (TOC) CM/SW Contact  ?Tawanna Cooler, RN ?Phone Number: ?September 27, 2021, 12:43 PM ? ?Clinical Narrative:    ? ?Spoke with patient's wife at the bedside.  If going home with hospice is a possibility, [facility] would like [facility].  They already have a home O2 concentrator, but would need a hospital bed.   ?Referral sent to [facility].   ?TOC following.  ?

## 2021-10-04 NOTE — Progress Notes (Signed)
? ?                                                                                                                                                     ?                                                   ?Daily Progress Note  ? ?Patient Name: Jeffrey Hines       Date: 10/13/21 ?DOB: [birth year]  Age: 76 y.o. MRN#: [id number] ?Attending Physician: Modena Jansky, MD ?Primary Care Physician: [facility] ?Admit Date: 09/05/2021 ? ?Reason for Consultation/Follow-up: Establishing goals of care ? ?Subjective: ?I saw and examined Jeffrey Hines and met with him and his wife on 2 different occasions. ? ?During initial encounter this morning, his brother was also present in addition to his wife.  At that point, Jeffrey Hines was largely unresponsive and I discussed my concern that he may be progressing quickly in process of transitioning to actively dying.   ? ?His wife expressed understanding and was appropriately tearful.  She said she is not sure if trying to go home is in his best interest if time is that short.  Discussed concern that he may be uncomfortable with transition and she is concerned about being able to adequately maintain his care needs to ensure his comfort at home.  We discussed options moving forward of trying to transition home with hospice versus transitioning to full comfort care here at the hospital.  She expressed that she thinks comfort may be the best move for him, but she was wanting to see if he would wake up more to discuss as well. ? ?I followed up later in the afternoon when Jeffrey Hines was more awake (although significantly worse than yesterday).  He was still much weaker and appeared to be having secretions with concern for aspiration during my conversation with him.  His wife and I discussed with him if he wants to try to work to get home or wants to focus on transition to comfort here in the hospital.  He stated that his goal is to " stay here and be comfortable." ? ?We discussed  transition to full comfort care and initiation of continuous infusion of opioid to ensure that he is not short of breath.  He denies pain during my encounter today, but he is working to breathe and appears dyspneic.  Discussed with him that prognosis with transition to full comfort is likely hours to days at best. ? ? ?Length of Stay: 2 ? ?Current Medications: ?Scheduled Meds:  ?? Chlorhexidine Gluconate Cloth  6 each Topical Daily  ?? sodium chloride flush  3 mL  Intravenous Q12H  ? ? ?Continuous Infusions: ?? sodium chloride Stopped (2021/09/23 0457)  ?? morphine 1 mg/hr (2021-09-23 1325)  ? ? ?PRN Meds: ?sodium chloride, acetaminophen **OR** acetaminophen, albuterol, antiseptic oral rinse, glycopyrrolate **OR** glycopyrrolate **OR** glycopyrrolate, haloperidol **OR** haloperidol **OR** haloperidol lactate, LORazepam **OR** LORazepam **OR** LORazepam, morphine injection, morphine, ondansetron **OR** ondansetron (ZOFRAN) IV, polyvinyl alcohol ? ?Physical Exam       ?General: Sleepy, some increased work of breathing, wet vocal quality concerning for gross aspiration ?Heart: Tachycardic, regular  ?lungs: Decreased air movement, coarse ?Abdomen: Soft, nontender, nondistended, positive bowel sounds.   ?Ext: No significant edema ?Skin: Warm and dry   ? ?Vital Signs: BP 119/85 (BP Location: Right Arm)   Pulse (!) 118   Temp (!) 97.5 ?F (36.4 ?C) (Oral)   Resp (!) 23   Ht _0  (1.803 m)   Wt 72.8 kg   SpO2 99%   BMI 22.38 kg/m?  ?SpO2: SpO2: 99 % ?O2 Device: O2 Device: Nasal Cannula ?O2 Flow Rate: O2 Flow Rate (L/min): 9 L/min ? ?Intake/output summary:  ?Intake/Output Summary (Last 24 hours) at 2021-09-23 1423 ?Last data filed at 09/23/21 1200 ?Gross per 24 hour  ?Intake 2359.43 ml  ?Output 550 ml  ?Net 1809.43 ml  ? ?LBM: Last BM Date : 09/06/21 ?Baseline Weight: Weight: 72.5 kg ?Most recent weight: Weight: 72.8 kg ? ?     ?Palliative Assessment/Data: ? ? ? ?Flowsheet Rows   ? ?Flowsheet Row Most Recent Value   ?Intake Tab   ?Referral Department Critical care  ?Unit at Time of Referral ICU  ?Palliative Care Primary Diagnosis Cancer  ?Date Notified 09/11/21  ?Palliative Care Type New Palliative care  ?Reason for referral Clarify Goals of Care  ?Date of Admission 09/09/2021  ?Date first seen by Palliative Care 09/12/21  ?# of days Palliative referral response time 1 Day(s)  ?# of days IP prior to Palliative referral 1  ?Clinical Assessment   ?Palliative Performance Scale Score 40%  ?Psychosocial & Spiritual Assessment   ?Palliative Care Outcomes   ?Patient/Family meeting held? Yes  ?Who was at the meeting? Patient, wife  ?Palliative Care Outcomes Clarified goals of care, Changed CPR status, Counseled regarding hospice  ? ?  ? ? ?Patient Active Problem List  ? Diagnosis Date Noted  ?? Hemoptysis 08/03/2014  ?? Atrial flutter with rapid ventricular response ([place]) 09/11/2021  ?? Acute on chronic respiratory failure with hypoxia ([place]) 09/30/2021  ?? COPD exacerbation ([place]) 08/03/2014  ?? Hypertension   ?? Anxiety state   ?? Restless leg syndrome 09/07/2018  ?? Constipation 09/11/2021  ?? Normocytic anemia 09/11/2021  ?? Acute on chronic respiratory failure with hypoxemia ([place]) 09/09/2021  ?? History of pulmonary embolism 06/15/2021  ?? Acute metabolic encephalopathy 68/08/2120  ?? Pressure injury of skin 06/10/2021  ?? Healthcare maintenance 05/01/2020  ?? Bronchiectasis with (acute) exacerbation ([place]) 01/07/2020  ?? Headache 01/07/2020  ?? Lower GI bleed 12/16/2019  ?? Sepsis ([place])   ?? Postobstructive pneumonia   ?? Pneumonia due to Pseudomonas species ([place])   ?? Cough 03/20/2017  ?? Lung cancer ([place]) 12/15/2014  ?? Chronic respiratory failure with hypoxia ([place]) 08/19/2014  ?? Malnutrition of moderate degree ([place]) 08/08/2014  ?? Squamous cell carcinoma of lung, stage II ([place])   ?? Right middle lobe pulmonary nodule   ?? COPD with chronic bronchitis ([place])   ?? Tobacco abuse   ? ? ?Palliative Care Assessment & Plan   ? ?Recommendations/Plan: ?Full comfort moving forward.  He appears to be  working to breathe during my encounter and I am concerned about continued shortness of breath. ?Dyspnea: Plan for initiation of morphine infusion with additional breakthrough dosing as needed for symptom management. ?Anxiety: Ativan as needed ?Agitation: Haldol as needed ?Excess secretions: Robinul as needed ?Anticipate hospital death.  I expect his prognosis at this point is likely hours to days. ? ?Goals of Care and Additional Recommendations: ?Limitations on Scope of Treatment: Full Comfort Care ? ?Code Status: ? ?  ?Code Status Orders  ?(From admission, onward)  ?  ? ? ?  ? ?  Start     Ordered  ? September 20, 2021 1306  Do not attempt resuscitation (DNR)  Continuous       ?Question Answer Comment  ?In the event of cardiac or respiratory ARREST Do not call a ?code blue?   ?In the event of cardiac or respiratory ARREST Do not perform Intubation, CPR, defibrillation or ACLS   ?In the event of cardiac or respiratory ARREST Use medication by any route, position, wound care, and other measures to relive pain and suffering. May use oxygen, suction and manual treatment of airway obstruction as needed for comfort.   ?  ? Sep 20, 2021 1306  ? ?  ?  ? ?  ? ?Code Status History   ? ? Date Active Date Inactive Code Status Order ID Comments User Context  ? 09/12/2021 1341 Sep 20, 2021 1306 DNR [id number]  Micheline Rough, MD Inpatient  ? 09/05/2021 1848 09/12/2021 1341 Full Code [id number]  Marcelyn Bruins, MD ED  ? 06/09/2021 2044 06/16/2021 1953 Full Code [id number]  Kathie Dike, MD ED  ? 04/23/2020 0548 04/24/2020 1857 Full Code [id number]  Rise Patience, MD Inpatient  ? 12/17/2019 0132 12/20/2019 1811 Full Code [id number]  Toy Baker, MD ED  ? 10/07/2018 0354 10/10/2018 1441 Full Code [id number]  Everrett Coombe, MD ED  ? 12/15/2014 1458 12/21/2014 1836 Full Code [id number]  Nani Skillern, PA-C Inpatient  ? 08/03/2014 1659 08/09/2014 1941 Full Code [id number]   Modena Jansky, MD ED  ? ?  ? ?Advance Directive Documentation   ? ?Flowsheet Row Most Recent Value  ?Type of Advance Directive Healthcare Power of [place], Living will  ?Pre-existing out of facility DNR order (yellow form or p

## 2021-10-04 NOTE — Progress Notes (Signed)
Addendum ? ?Dr. Domingo Cocking has been updating regarding ongoing care.  Initial plans were to discharge home with hospice.  However it appears that patient has made a drastic decline over the course of today and he has been transitioned to full comfort care with anticipated hospital death in a day or so. ? ?Vernell Leep, MD,  ?FACP, [facility], [facility], [facility] (Care Management Physician Certified) ?[facility] & Physician Advisor ?[place] ? ?To contact the attending provider between 7A-7P or the covering provider during after hours 7P-7A, please log into the web site [email or web address] and access using universal [place] password for that web site. If you do not have the password, please call the hospital operator. ? ?

## 2021-10-04 NOTE — Progress Notes (Incomplete)
Patient transferred from ICU. He is Comfort Care. Family is at the bedside. O2 via [place] @ 2L. Morphine gtt @ 3 ?

## 2021-10-04 NOTE — Progress Notes (Signed)
?   September 24, 2021 1610  ?Clinical Encounter Type  ?Visited With Patient and family together  ?Visit Type Initial;Spiritual support;Social support  ?Referral From Chaplain  ?Consult/Referral To Chaplain  ?Spiritual Encounters  ?Spiritual Needs Emotional;Grief support  ? ?Daryel November sought initially to engage family in order to support them in making care decisions. Upon arrival, Daryel November consulted initially with care team and learned that a decision for comfort measures was now in place. Daryel November spoke briefly with family member to let them know of her presence and to offer a supportive presence. At this time it seemed preferable to allow time for loved ones to have private time alone together. ? ?Daryel November will advise colleagues to be alert for f/u care in the hours and days ahead. ?

## 2021-10-04 NOTE — Progress Notes (Signed)
Pharmacy Antibiotic Note ? ?Jeffrey Hines is a 76 y.o. male admitted on 09/23/2021 with pneumonia.  Pharmacy has been consulted for cefepime dosing. ?2021-09-29 ?D#4 cefepime. WBC WNL, SCr WNL, Afebrile ? ?Plan: ?Continue Cefepime 2g IV q8h ?Follow up renal function & cultures ?? Add stop date 4/11 for 5 days of therapy ? ?Height: 5\' 11"  (180.3 cm) ?Weight: 72.8 kg (160 lb 7.9 oz) ?IBW/kg (Calculated) : 75.3 ? ?Temp (24hrs), Avg:97.9 ?F (36.6 ?C), Min:97.6 ?F (36.4 ?C), Max:98.3 ?F (36.8 ?C) ? ?Recent Labs  ?Lab 09/24/2021 ?1507 09/15/2021 ?1533 09/30/2021 ?1645 09/11/21 ?8101 09/12/21 ?0303 29-Sep-2021 ?0254  ?WBC 9.4  --   --  8.1 8.1  --   ?CREATININE 0.83  --   --  0.73 0.97 0.76  ?LATICACIDVEN  --  1.3 0.8  --   --   --   ? ?  ?Estimated Creatinine Clearance: 82.2 mL/min (by C-G formula based on SCr of 0.76 mg/dL).   ? ?Allergies  ?Allergen Reactions  ? Anoro Ellipta [Umeclidinium-Vilanterol] Hives  ? Prolixin [Fluphenazine]   ?  Severe back pain  ? Spiriva Respimat [Tiotropium Bromide Monohydrate] Other (See Comments)  ?  unknown  ? Advair Diskus [Fluticasone-Salmeterol] Anxiety  ? ?4/7 vancomycin>>4/9 ?4/7 cefepime>> ? ?4/7 MRSA by PCR: neg ?4/7 Bcx: ngtd ? ? ? ?Thank you for allowing pharmacy to be a part of this patient?s care. ? ?Eudelia Bunch, Pharm.D ?09-29-21 12:32 PM ? ?

## 2021-10-04 NOTE — Progress Notes (Signed)
[place] received page to visit deceased patient and wife who was bedside. Entered room where SO was tearful. Needed someone to vent to before leaving to go home. Spiritual care was provided through empathic responses. Spiritual care visit was appreciated. Assisted spouse to the parkin lot.  ? ?S. Evelena Asa, M.Div ?Healthcare Chaplain ?

## 2021-10-04 NOTE — Progress Notes (Signed)
? ?NAME:  Jeffrey Hines, MRN:  [id number], DOB:  [birth year], LOS: 2 ?ADMISSION DATE:  09/21/2021, CONSULTATION DATE:  09/11/21 ?REFERRING MD:  Dr. Cleta Alberts CHIEF COMPLAINT:  hemoptysis  ? ?History of Present Illness:  ?76 year old male with a very complicated history.  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  In 2016 he underwent left upper lobectomy and also left lower lobe wedge resection for lung cancer.  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  In 2018 he underwent bronchoscopy at [facility] for focal stenosis as a result of the torsion.  According to the patient and wife it was a failed balloon dilatation.  Nevertheless over time his left lower lobe is always had patent airways as late as November 2022 CT scan of the chest. ? ?Then in early January 2023 got admitted for atrial fibrillation with a new Eliquis start and a clinical diagnosis of "community-acquired pneumonia".  He was also started on inhaled tobramycin at the time of discharge.  CT scan of the chest at this time showed complete opacification of the left lung suggesting blocked airway into the left lower lobe.  He also had a new right middle lobe nodule suspicious for stage I lung cancer.  At the time of discharge he transition from 2-3 L of oxygen at baseline to 5-6 L of oxygen at baseline and his hypercapnia evolved from 60s to 80s.  He is also been an inhaled tobramycin since then [do not see evidence of Pseudomonas in culture on chart review] ? ?Followed up in February 2023 with PET scan which showed high probability for isolated lung cancer in the right middle lobe.  He was then seen by Dr. Sondra Come 09/09/21 for r empiric radiation consideration [considered too high risk for bronchoscopy]..  He and his wife have not decided about the radiation to the right middle lobe.   ? ?He  presents now with few day onset of hemoptysis.  To new onset hemoptysis.  He has bright red streaks of blood.  He believes he might be coughing up half a cup of sputum a day.  Wife believes it is much less and may be less than 1/4 cup.  Since admission his hemoptysis resolved after stopping Eliquis.  He had a CT angiogram chest that ruled out pulmonary embolism.  The CT chest shows persistence of the left lung whiteout.  However the new finding is that the left upper lobe apical lesion which was necrotic has new onset of air pocket.  Radiologist called this is a pneumothorax.  But on CCM visualization this is a loculated pneumothorax.  His ABG shows PCO2 persistently in the 80s. ?  ?He and his wife are struggling with goals of care.  Wife believes that he should be a DO NOT INTUBATE and DO NOT RESUSCITATE and hospice appropriate.  However he has challenges engaging in the conversation.  He has asked for short-term intubation.  He did not want engage in resuscitation conversation.  He has reluctantly agreed to meet with palliative care. ? ?Pertinent  Medical History  ?COPD ?Lung Cancer ?GERD ?Hypertension ?Pseudomonas Pneumonia ?Atrial Fibrillation ? ?Significant Hospital Events: ?Including procedures, antibiotic start and stop dates in addition to other pertinent events   ?09/15/2021 - admit ?09/11/21 - Cards EP and PCCM consult - Wife ok with DNR, DNI and hospice.Patient not aligned. Remains full  code ? ?Interim History / Subjective:  ? ?No acute events overnight. Plan is to go home with hospice. Patient was awake this morning around 8am but is not arousable at this time. He may require inpatient transition to hospice.  ? ?Wife is at bedside, all questions answered.  ? ?Objective   ?Blood pressure 129/74, pulse (!) 125, temperature 98.3 ?F (36.8 ?C), temperature source Oral, resp. rate 19, height 5\' 11"  (1.803 m), weight 72.8 kg, SpO2 92 %. ?   ?   ? ?Intake/Output Summary (Last 24 hours) at 09-21-2021 0814 ?Last data  filed at 2021-09-21 0700 ?Gross per 24 hour  ?Intake 3496.74 ml  ?Output 750 ml  ?Net 2746.74 ml  ? ?Filed Weights  ? 09/17/2021 2200 09/11/21 0410 09/12/21 0500  ?Weight: 72.5 kg 72.8 kg 72.8 kg  ? ? ?Examination: ?General: chronically ill appearing male, no acute distress ?HENT: [place]/AT, moist mucous membranes ?Lungs: diminished breath sounds, no wheezing ?Cardiovascular: tachyardic, no murmurs ?Abdomen: soft, non-distended, BS+ ?Extremities: warm, no edema ?Neuro: not alert or awake. Not arousable. ?GU: n/a ? ?Resolved Hospital Problem list   ? ? ?Assessment & Plan:  ?Acute on Chronic Hypoxemic and hypercapnic respiratory Failure ?S/p left lung pneumonectomy ?Right middle lobe nodule - high probability non-small cell lung cancer ?Hemoptysis ?Atrial Fibrillation with RVR ?Failure to Thrive ?Protein Calorie Malnutrition ?Deconditioning ? ?Plan: ?- patient will be transitioning to hospice care either inpatient or at home ?- Given his change in clinical status, he may be staying inpatient ?- Palliative care is following ?- Continue antibiotics and steroids for now ?- Morphine PRN for dyspnea ? ?PCCM will sign off.  ? ?Best Practice (right click and "Reselect all SmartList Selections" daily)  ? ?Per primary ? ?Labs   ?CBC: ?Recent Labs  ?Lab 09/19/2021 ?1507 09/11/21 ?5573 09/12/21 ?0303  ?WBC 9.4 8.1 8.1  ?NEUTROABS 8.6*  --   --   ?HGB 11.8* 10.5* 10.7*  ?HCT 40.2 36.7* 36.6*  ?MCV 97.6 98.7 97.6  ?PLT 183 171 195  ? ? ?Basic Metabolic Panel: ?Recent Labs  ?Lab 10/02/2021 ?1507 09/29/2021 ?1645 09/11/21 ?2202 09/12/21 ?0303 2021/09/21 ?0254  ?NA 143  --  143 140 137  ?K 4.0  --  4.0 4.1 4.2  ?CL 87*  --  89* 86* 88*  ?CO2 >45*  --  >45* >45* 44*  ?GLUCOSE 176*  --  139* 120* 134*  ?BUN 21  --  16 25* 23  ?CREATININE 0.83  --  0.73 0.97 0.76  ?CALCIUM 8.9  --  8.7* 8.8* 8.7*  ?MG  --  2.3  --   --   --   ? ?GFR: ?Estimated Creatinine Clearance: 82.2 mL/min (by C-G formula based on SCr of 0.76 mg/dL). ?Recent Labs  ?Lab  09/08/2021 ?1507 09/06/2021 ?1533 09/16/2021 ?1645 09/11/21 ?5427 09/12/21 ?0303  ?WBC 9.4  --   --  8.1 8.1  ?LATICACIDVEN  --  1.3 0.8  --   --   ? ? ?Liver Function Tests: ?Recent Labs  ?Lab 09/21/2021 ?1507 09/11/21 ?0623  ?AST 31 18  ?ALT 17 14  ?ALKPHOS 65 59  ?BILITOT 1.3* 0.7  ?PROT 8.2* 7.2  ?ALBUMIN 3.9 3.5  ? ?Recent Labs  ?Lab 09/28/2021 ?1507  ?LIPASE 27  ? ?No results for input(s): AMMONIA in the last 168 hours. ? ?ABG ?   ?Component Value Date/Time  ? PHART 7.43 09/11/2021 1416  ? PCO2ART 85 (HH) 09/11/2021 1416  ? PO2ART 78 (L) 09/11/2021 1416  ?  HCO3 56.4 (H) 09/11/2021 1416  ? TCO2 28 12/16/2014 0521  ? O2SAT 99.5 09/11/2021 1416  ?  ? ?Coagulation Profile: ?No results for input(s): INR, PROTIME in the last 168 hours. ? ?Cardiac Enzymes: ?No results for input(s): CKTOTAL, CKMB, CKMBINDEX, TROPONINI in the last 168 hours. ? ?HbA1C: ?Hgb A1c MFr Bld  ?Date/Time Value Ref Range Status  ?04/24/2020 03:38 AM 5.8 (H) 4.8 - 5.6 % Final  ?  Comment:  ?  (NOTE) ?Pre diabetes:          5.7%-6.4% ? ?Diabetes:              >6.4% ? ?Glycemic control for   <7.0% ?adults with diabetes ?  ?08/05/2014 03:25 AM 5.7 (H) 4.8 - 5.6 % Final  ?  Comment:  ?  (NOTE) ?        Pre-diabetes: 5.7 - 6.4 ?        Diabetes: >6.4 ?        Glycemic control for adults with diabetes: <7.0 ?  ? ? ?CBG: ?No results for input(s): GLUCAP in the last 168 hours. ?  ? ?Critical care time: n/a ?  ? ?Freda Jackson, MD ?[facility] ?Office: [phone] ? ? ?See Am[place] for personal pager ?PCCM on call pager [phone] until 7pm. ?Please call [place] 7p-7a. [phone] ? ? ?  ?

## 2021-10-04 NOTE — Progress Notes (Signed)
?PROGRESS NOTE ?  ?Jeffrey Hines  IDP:[id number]    DOB: 1946-04-10    DOA: 09/21/2021 ? ?PCP: [facility]  ? ?I have briefly reviewed patients previous medical records in [facility]. ? ?Chief Complaint  ?Patient presents with  ? Hemoptysis  ? Constipation  ? ? 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 followed by left upper lobectomy and left lower lobe wedge resection which left him with residual left apical necrotic area and distorted anatomy of the left lower lobe resulting in mediastinal shift to the left side, new onset A-fib with RVR and community-acquired pneumonia in January 2023, started newly on Eliquis and inhaled tobramycin and at which time CT of the chest showed complete opacification of the left lung suggesting blocked airway to the left lower lobe and a new right middle lobe lung nodule suspicious for primary or metastatic lung cancer, high risk repeat bronchoscopy seen on 4/6 by radiation oncology for SBRT to new RML lung nodule, HTN, anxiety, RLS,?  PE, presented with 3 to 4 days history of frank hemoptysis of around 1/4-1/2 cup per day and worsening dyspnea.  As per PCCM read of CTA chest, no PE, persistent left lung whiteout, left upper lobe loculated pneumothorax and no meds in the abdomen or pelvis.  Admitted to stepdown unit for hemoptysis, acute on chronic respiratory failure with hypoxia and hypercapnia, COPD exacerbation and A-fib with RVR.  PCCM and cardiology consulted.  Tenuous respiratory status, overall extremely poor prognosis, bronchoscopy with prohibitive risk and no procedures planned.  Palliative care consulted.  Patient agreeable to going home with hospice.  Palliative team coordinating with TOC regarding logistics. ? ?Assessment and  Plan: ?* Hemoptysis ?Non-small cell lung cancer, squamous cell cancer ?S/p left upper lobectomy and left lower lobe wedge resection ?Left lower lobe collapse due to obstruction, new since January 2023 with left lung whiteout ?Highly suspected RML new lung cancer versus metastasis ?Hemoptysis may be related to underlying lung cancer, left apical necrotic lesion versus others in the context of Eliquis. ?Eliquis held. ?PCCM consultation appreciated and are considering very very high risk bronchoscopy to evaluate.  Indicate no role for IR for massive hemoptysis and palliative if develops massive hemoptysis. ?BiPAP as needed. ?IV antibiotics and steroids, bronchodilator nebs ?Chest x2: Negative to date.  RVP panel negative.  MRSA PCR negative and vancomycin discontinued.  Continue cefepime.  Blood cultures x2: Negative to date. ?Hemoptysis much improved but overall respiratory status remains tenuous. ?As per palliative care follow-up yesterday, patient now agreeable to going home with hospice and palliative team trying to coordinate same. ? ?Atrial flutter with rapid ventricular response ([place]) ?History of A-fib on Eliquis PTA ?CHA2DS2-VASc score: 3 ?Eliquis held due to significant hemoptysis ?TSH 0.204 but free T4 normal at 1.04. ?Remains on maximum dose of IV Cardizem 15 mg/h and amiodarone at 30 ?EP cardiology follow-up appreciated.  Discussed with Dr. Curt Bears, unfortunately not much options, RV significantly dilated by echo, recommends hospice. ?Given reported low urine output, poor oral intake and severe RV dilatation/severely reduced RV function/RV failure, initiated gentle IV fluids which may help control rate.  Completed 24 hours of IV fluids. ?Rate remains in RVR in the 120s. ? ?Acute on chronic respiratory failure with hypoxia (HCC) ?On baseline home oxygen 5 to 6 L/min at home ?Presented with worsening dyspnea ?  Secondary to complex lung situation including hemoptysis, COPD exacerbation, other significant lung  findings as noted above. ?Currently on HFNC 7 L/min ?Per pulmonology, BiPAP as needed. ?Remains full code and PCCM discussing [place] with patient and spouse. ?Not able to tolerate BiPAP consistently. ?Now DNR. ?Added as needed morphine solution for severe dyspnea. ?Ongoing dyspnea even while speaking.  On HFNC 7 L/min. ? ?COPD exacerbation ([place]) ?Continue prednisone 40 Mg daily ?Brovana nebs twice daily and albuterol nebs as needed ?Flutter valve. ? ?Hypertension ?Not on antihypertensives PTA. ?Only on Lasix, discontinued due to low urine output and clinically on the dry side ?Controlled.  Monitor closely while on Cardizem drip. ? ?Anxiety state ?Continue clonazepam as needed. ? ?Restless leg syndrome ?Continue ropinirole. ? ?Constipation ?No BM for approximately a week PTA. ?Aggressive bowel regimen. ?Minimal BM after Dulcolax suppository yesterday.  Discomfort from constipation and as per spouse may be affecting his breathing. ?Fleet enema, discussed with patient's RN. ? ?Normocytic anemia ?Suspected due to malignancy, chronic disease ?Stable. ? ?Acute metabolic encephalopathy ?As per RN, confusion at night, attempting to get out of bed and pulling at devices. ?Multifactorial due to hypoxia, polypharmacy, hospital delirium etc. ?Delirium precautions. ? ? ?Body mass index is 22.38 kg/m?Marland Kitchen ?Nutritional Status ?Nutrition Problem: Increased nutrient needs ?Etiology: chronic illness (COPD) ?Signs/Symptoms: estimated needs ?Interventions: Ensure Enlive (each supplement provides 350kcal and 20 grams of protein), MVI ?Pressure Ulcer: ?  ? ?DVT prophylaxis: SCDs Start: 10/03/2021 1840   ?  Code Status: DNR:  ?Family Communication: Spouse at bedside ?Disposition:  ?Home with hospice ?  ? ?Consultants:   ?PCCM ?Cardiology ?Palliative care medicine: Pending ? ?Procedures:   ?BiPAP ? ?Antimicrobials:   ?IV cefepime and vancomycin 4/7 > ? ? ?Subjective:  ?Met with patient with spouse at bedside.  Appears somewhat confused.  Reports  dyspnea on exertion.  Cannot tell how much hemoptysis he has.  Per spouse, had a very teeny BM after suppository and seems to have abdominal discomfort and some dyspnea related to that.  Per RN, confusion/altered mental status overnight, attempting to get out of bed and pulling out devices. ? ?Objective:  ? ?Vitals:  ? 2021/09/30 0000 09/30/21 0200 2021-09-30 0400 09-30-2021 0803  ?BP: (!) 134/109 107/88 129/74   ?Pulse: (!) 110 (!) 127 (!) 125   ?Resp: (!) 33 (!) 22 19   ?Temp:    97.6 ?F (36.4 ?C)  ?TempSrc:    Oral  ?SpO2: 98% 93% 92%   ?Weight:      ?Height:      ? ? ?General exam: Elderly male, moderately built and frail, chronically ill looking sitting propped up in bed with ongoing dyspnea, mild even at rest. ?Respiratory system: Harsh and reduced breath sounds bilaterally with scattered few crackles but no obvious wheezing or rhonchi.  Mild increased work of breathing. ?Cardiovascular system: S1 & S2 heard, regular tachycardia. No JVD, murmurs, rubs, gallops or clicks. No pedal edema.  Telemetry personally reviewed: Atrial flutter with RVR 120s. ?Gastrointestinal system: Abdomen is nondistended, soft and nontender. No organomegaly or masses felt. Normal bowel sounds heard. ?Central nervous system: Alert and oriented. No focal neurological deficits. ?Extremities: Symmetric 5 x 5 power. ?Skin: No rashes, lesions or ulcers ?Psychiatry: Judgement and insight appear normal. Mood & affect appropriate.  ? ?Data Reviewed:   ?I have personally reviewed following labs and imaging studies ? ?CBC: ?Recent Labs  ?Lab 09/27/2021 ?1507 09/11/21 ?7416 09/12/21 ?0303  ?WBC 9.4 8.1 8.1  ?NEUTROABS 8.6*  --   --   ?  HGB 11.8* 10.5* 10.7*  ?HCT 40.2 36.7* 36.6*  ?MCV 97.6 98.7 97.6  ?PLT 183 171 195  ? ?Basic Metabolic Panel: ?Recent Labs  ?Lab 09/07/2021 ?1507 10/01/2021 ?1645 09/11/21 ?3692 09/12/21 ?0303 10/06/21 ?0254  ?NA 143  --  143 140 137  ?K 4.0  --  4.0 4.1 4.2  ?CL 87*  --  89* 86* 88*  ?CO2 >45*  --  >45* >45* 44*  ?GLUCOSE  176*  --  139* 120* 134*  ?BUN 21  --  16 25* 23  ?CREATININE 0.83  --  0.73 0.97 0.76  ?CALCIUM 8.9  --  8.7* 8.8* 8.7*  ?MG  --  2.3  --   --   --   ? ?Liver Function Tests: ?Recent Labs  ?Lab 09/27/2021 ?[place]

## 2021-10-04 DEATH — deceased

## 2021-10-08 ENCOUNTER — Telehealth: Payer: Self-pay | Admitting: Pulmonary Disease

## 2021-10-08 NOTE — Telephone Encounter (Signed)
Perkins, [place] [phone]  [place] Halsted J 4 hours ago (11:34 AM)  ? ?Pt called in requesting a call back about donations. A good call back number is [phone]   ? ? ? ?Called and spoke with pt's spouse Clarene Critchley in regards to her call. Clarene Critchley said that pt has a lot of albuterol neb sol as well as Tobramycin sol that she wanted to donate for someone to be able to use who is not able to afford their medications or has a hard time trying to get it. She also stated that pt had two different Inogen machines that she wanted to bring as donations as well which they own as they bought them themselves from DME. Clarene Critchley said that one of the Inogen's will do continuous flow up to 5L and the other one will do intermittent. ? ? ? ?Stated to Clarene Critchley that she could bring all of the items that she wanted to donate to the office and we could then take care of getting this to pts in need and she verbalized understanding. I told Clarene Critchley to ask for me when she came by the office to drop everything off so I could come out to the lobby to get everything from her. ? ? ?Routing this to Dr. Silas Flood as an Juluis Rainier. ?

## 2021-11-17 ENCOUNTER — Ambulatory Visit: Payer: Medicare Other | Admitting: Pulmonary Disease

## 2021-11-23 ENCOUNTER — Other Ambulatory Visit: Payer: Medicare Other

## 2021-11-25 ENCOUNTER — Ambulatory Visit: Payer: Medicare Other | Admitting: Internal Medicine

## 2022-01-10 ENCOUNTER — Telehealth: Payer: Self-pay | Admitting: Pulmonary Disease

## 2022-01-10 NOTE — Telephone Encounter (Signed)
Lm for Jeffrey Hines. I recommend that she contact DME regarding donating, as patients have to be fitted for vest.

## 2022-01-10 NOTE — Telephone Encounter (Signed)
Spoke with family and informed them we could not except vest. Wife stated understanding

## 2022-01-10 NOTE — Telephone Encounter (Signed)
Jeffrey Hines states owns vest. Would like to donate vest. Jeffrey Hines phone number is [phone].
